# Patient Record
Sex: Female | Born: 1965 | Race: White | Hispanic: No | Marital: Married | Smoking: Former smoker
Health system: Southern US, Community
[De-identification: ages and names within clinical notes are randomized; demographics above are authoritative.]

## PROBLEM LIST (undated history)

## (undated) DIAGNOSIS — K219 Gastro-esophageal reflux disease without esophagitis: Secondary | ICD-10-CM

## (undated) DIAGNOSIS — Z9221 Personal history of antineoplastic chemotherapy: Secondary | ICD-10-CM

## (undated) DIAGNOSIS — K8020 Calculus of gallbladder without cholecystitis without obstruction: Secondary | ICD-10-CM

## (undated) DIAGNOSIS — E1165 Type 2 diabetes mellitus with hyperglycemia: Principal | ICD-10-CM

## (undated) DIAGNOSIS — K56609 Unspecified intestinal obstruction, unspecified as to partial versus complete obstruction: Secondary | ICD-10-CM

## (undated) DIAGNOSIS — R0789 Other chest pain: Secondary | ICD-10-CM

## (undated) DIAGNOSIS — I10 Essential (primary) hypertension: Secondary | ICD-10-CM

## (undated) DIAGNOSIS — M797 Fibromyalgia: Secondary | ICD-10-CM

## (undated) DIAGNOSIS — C50919 Malignant neoplasm of unspecified site of unspecified female breast: Secondary | ICD-10-CM

## (undated) DIAGNOSIS — G4733 Obstructive sleep apnea (adult) (pediatric): Secondary | ICD-10-CM

## (undated) DIAGNOSIS — G5693 Unspecified mononeuropathy of bilateral upper limbs: Secondary | ICD-10-CM

## (undated) DIAGNOSIS — G629 Polyneuropathy, unspecified: Secondary | ICD-10-CM

## (undated) DIAGNOSIS — E785 Hyperlipidemia, unspecified: Secondary | ICD-10-CM

## (undated) DIAGNOSIS — K7581 Nonalcoholic steatohepatitis (NASH): Secondary | ICD-10-CM

## (undated) DIAGNOSIS — J069 Acute upper respiratory infection, unspecified: Secondary | ICD-10-CM

## (undated) DIAGNOSIS — F909 Attention-deficit hyperactivity disorder, unspecified type: Secondary | ICD-10-CM

## (undated) DIAGNOSIS — F32A Depression, unspecified: Secondary | ICD-10-CM

## (undated) DIAGNOSIS — F419 Anxiety disorder, unspecified: Secondary | ICD-10-CM

## (undated) DIAGNOSIS — N838 Other noninflammatory disorders of ovary, fallopian tube and broad ligament: Secondary | ICD-10-CM

## (undated) DIAGNOSIS — F329 Major depressive disorder, single episode, unspecified: Secondary | ICD-10-CM

## (undated) DIAGNOSIS — J4540 Moderate persistent asthma, uncomplicated: Secondary | ICD-10-CM

## (undated) DIAGNOSIS — G2581 Restless legs syndrome: Secondary | ICD-10-CM

## (undated) DIAGNOSIS — F988 Other specified behavioral and emotional disorders with onset usually occurring in childhood and adolescence: Secondary | ICD-10-CM

## (undated) DIAGNOSIS — J4530 Mild persistent asthma, uncomplicated: Secondary | ICD-10-CM

## (undated) HISTORY — DX: Depression, unspecified: F32.A

## (undated) HISTORY — DX: Morbid (severe) obesity due to excess calories: E66.01

## (undated) HISTORY — DX: Obstructive sleep apnea (adult) (pediatric): G47.33

## (undated) HISTORY — DX: Personal history of antineoplastic chemotherapy: Z92.21

## (undated) HISTORY — DX: Hyperlipidemia, unspecified: E78.5

## (undated) HISTORY — DX: Type 2 diabetes mellitus with hyperglycemia: E11.65

## (undated) HISTORY — DX: Other noninflammatory disorders of ovary, fallopian tube and broad ligament: N83.8

## (undated) HISTORY — DX: Major depressive disorder, single episode, unspecified: F32.9

## (undated) HISTORY — DX: Restless legs syndrome: G25.81

## (undated) HISTORY — DX: Moderate persistent asthma, uncomplicated: J45.40

## (undated) HISTORY — DX: Polyneuropathy, unspecified: G62.9

## (undated) HISTORY — DX: Gastro-esophageal reflux disease without esophagitis: K21.9

## (undated) HISTORY — DX: Acute upper respiratory infection, unspecified: J06.9

## (undated) HISTORY — DX: Anxiety disorder, unspecified: F41.9

## (undated) HISTORY — DX: Unspecified mononeuropathy of bilateral upper limbs: G56.93

## (undated) HISTORY — DX: Mild persistent asthma, uncomplicated: J45.30

## (undated) HISTORY — PX: OTHER SURGICAL HISTORY: SHX169

## (undated) HISTORY — DX: Other chest pain: R07.89

## (undated) HISTORY — DX: Malignant neoplasm of unspecified site of unspecified female breast: C50.919

## (undated) HISTORY — DX: Calculus of gallbladder without cholecystitis without obstruction: K80.20

## (undated) HISTORY — DX: Nonalcoholic steatohepatitis (NASH): K75.81

## (undated) HISTORY — DX: Attention-deficit hyperactivity disorder, unspecified type: F90.9

## (undated) HISTORY — DX: Essential (primary) hypertension: I10

## (undated) HISTORY — DX: Unspecified intestinal obstruction, unspecified as to partial versus complete obstruction: K56.609

## (undated) HISTORY — DX: Other specified behavioral and emotional disorders with onset usually occurring in childhood and adolescence: F98.8

## (undated) HISTORY — DX: Fibromyalgia: M79.7

---
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
## 1998-09-28 ENCOUNTER — Emergency Department (HOSPITAL_COMMUNITY): Admission: EM | Admit: 1998-09-28 | Discharge: 1998-09-28 | Payer: Self-pay | Admitting: Emergency Medicine

## 2003-04-17 ENCOUNTER — Other Ambulatory Visit: Admission: RE | Admit: 2003-04-17 | Discharge: 2003-04-17 | Payer: Self-pay | Admitting: Family Medicine

## 2004-03-08 ENCOUNTER — Ambulatory Visit: Payer: Self-pay | Admitting: Internal Medicine

## 2004-05-02 ENCOUNTER — Ambulatory Visit: Payer: Self-pay | Admitting: Internal Medicine

## 2004-05-13 ENCOUNTER — Ambulatory Visit: Payer: Self-pay | Admitting: Family Medicine

## 2004-05-13 ENCOUNTER — Other Ambulatory Visit: Admission: RE | Admit: 2004-05-13 | Discharge: 2004-05-13 | Payer: Self-pay | Admitting: Family Medicine

## 2004-06-17 ENCOUNTER — Ambulatory Visit: Payer: Self-pay | Admitting: Family Medicine

## 2004-09-30 ENCOUNTER — Ambulatory Visit: Payer: Self-pay | Admitting: Internal Medicine

## 2005-01-18 ENCOUNTER — Encounter: Payer: Self-pay | Admitting: Pulmonary Disease

## 2005-01-18 ENCOUNTER — Ambulatory Visit (HOSPITAL_BASED_OUTPATIENT_CLINIC_OR_DEPARTMENT_OTHER): Admission: RE | Admit: 2005-01-18 | Discharge: 2005-01-18 | Payer: Self-pay | Admitting: Internal Medicine

## 2005-01-20 ENCOUNTER — Ambulatory Visit: Payer: Self-pay | Admitting: Pulmonary Disease

## 2005-03-24 ENCOUNTER — Ambulatory Visit: Payer: Self-pay | Admitting: Internal Medicine

## 2005-07-28 ENCOUNTER — Ambulatory Visit: Payer: Self-pay | Admitting: Internal Medicine

## 2006-07-24 ENCOUNTER — Encounter: Payer: Self-pay | Admitting: Internal Medicine

## 2006-07-24 ENCOUNTER — Ambulatory Visit: Payer: Self-pay | Admitting: Internal Medicine

## 2006-08-02 ENCOUNTER — Ambulatory Visit: Payer: Self-pay | Admitting: Internal Medicine

## 2006-08-02 LAB — CONVERTED CEMR LAB
Basophils Relative: 0.4 % (ref 0.0–1.0)
Bilirubin, Direct: 0.1 mg/dL (ref 0.0–0.3)
CO2: 29 meq/L (ref 19–32)
Cholesterol: 182 mg/dL (ref 0–200)
Eosinophils Relative: 3.6 % (ref 0.0–5.0)
GFR calc Af Amer: 119 mL/min
Glucose, Bld: 94 mg/dL (ref 70–99)
HCT: 41.2 % (ref 36.0–46.0)
Hemoglobin: 14.3 g/dL (ref 12.0–15.0)
Lymphocytes Relative: 32 % (ref 12.0–46.0)
Monocytes Absolute: 0.4 10*3/uL (ref 0.2–0.7)
Monocytes Relative: 6.7 % (ref 3.0–11.0)
Neutro Abs: 3.8 10*3/uL (ref 1.4–7.7)
Neutrophils Relative %: 57.3 % (ref 43.0–77.0)
Potassium: 3.8 meq/L (ref 3.5–5.1)
Sed Rate: 10 mm/hr (ref 0–25)
Sodium: 141 meq/L (ref 135–145)
Tissue Transglutaminase Ab, IgA: <3 units (ref <5)
Total Bilirubin: 0.8 mg/dL (ref 0.3–1.2)
Total Protein: 6.9 g/dL (ref 6.0–8.3)
Uric Acid, Serum: 7.3 mg/dL — ABNORMAL HIGH (ref 2.4–7.0)
VLDL: 41 mg/dL — ABNORMAL HIGH (ref 0–40)
WBC: 6.4 10*3/uL (ref 4.5–10.5)

## 2006-08-20 ENCOUNTER — Encounter: Payer: Self-pay | Admitting: Internal Medicine

## 2006-10-22 ENCOUNTER — Ambulatory Visit: Payer: Self-pay | Admitting: Family Medicine

## 2006-10-22 ENCOUNTER — Other Ambulatory Visit: Admission: RE | Admit: 2006-10-22 | Discharge: 2006-10-22 | Payer: Self-pay | Admitting: Family Medicine

## 2006-10-22 ENCOUNTER — Encounter: Payer: Self-pay | Admitting: Family Medicine

## 2006-10-24 ENCOUNTER — Encounter (INDEPENDENT_AMBULATORY_CARE_PROVIDER_SITE_OTHER): Payer: Self-pay | Admitting: *Deleted

## 2007-02-13 ENCOUNTER — Telehealth (INDEPENDENT_AMBULATORY_CARE_PROVIDER_SITE_OTHER): Payer: Self-pay | Admitting: *Deleted

## 2007-03-25 ENCOUNTER — Telehealth: Payer: Self-pay | Admitting: Internal Medicine

## 2007-04-10 ENCOUNTER — Other Ambulatory Visit: Admission: RE | Admit: 2007-04-10 | Discharge: 2007-04-10 | Payer: Self-pay | Admitting: Family Medicine

## 2007-04-10 ENCOUNTER — Ambulatory Visit: Payer: Self-pay | Admitting: Family Medicine

## 2007-04-10 ENCOUNTER — Encounter: Payer: Self-pay | Admitting: Family Medicine

## 2007-04-10 DIAGNOSIS — G2581 Restless legs syndrome: Secondary | ICD-10-CM

## 2007-04-10 DIAGNOSIS — G4733 Obstructive sleep apnea (adult) (pediatric): Secondary | ICD-10-CM

## 2007-04-10 DIAGNOSIS — R87619 Unspecified abnormal cytological findings in specimens from cervix uteri: Secondary | ICD-10-CM

## 2007-04-15 ENCOUNTER — Encounter (INDEPENDENT_AMBULATORY_CARE_PROVIDER_SITE_OTHER): Payer: Self-pay | Admitting: *Deleted

## 2007-04-18 ENCOUNTER — Ambulatory Visit: Payer: Self-pay | Admitting: Pulmonary Disease

## 2007-04-18 DIAGNOSIS — J309 Allergic rhinitis, unspecified: Secondary | ICD-10-CM

## 2007-04-18 DIAGNOSIS — J4530 Mild persistent asthma, uncomplicated: Secondary | ICD-10-CM | POA: Insufficient documentation

## 2007-04-18 DIAGNOSIS — I10 Essential (primary) hypertension: Secondary | ICD-10-CM

## 2007-04-18 DIAGNOSIS — J3089 Other allergic rhinitis: Secondary | ICD-10-CM | POA: Insufficient documentation

## 2007-04-23 ENCOUNTER — Telehealth: Payer: Self-pay | Admitting: Pulmonary Disease

## 2007-05-10 ENCOUNTER — Telehealth (INDEPENDENT_AMBULATORY_CARE_PROVIDER_SITE_OTHER): Payer: Self-pay | Admitting: *Deleted

## 2007-05-16 ENCOUNTER — Ambulatory Visit: Payer: Self-pay | Admitting: Internal Medicine

## 2007-06-22 ENCOUNTER — Emergency Department (HOSPITAL_COMMUNITY): Admission: EM | Admit: 2007-06-22 | Discharge: 2007-06-22 | Payer: Self-pay | Admitting: Emergency Medicine

## 2007-07-17 ENCOUNTER — Telehealth: Payer: Self-pay | Admitting: Pulmonary Disease

## 2007-08-21 ENCOUNTER — Ambulatory Visit: Payer: Self-pay | Admitting: Internal Medicine

## 2007-08-26 ENCOUNTER — Telehealth (INDEPENDENT_AMBULATORY_CARE_PROVIDER_SITE_OTHER): Payer: Self-pay | Admitting: *Deleted

## 2007-09-20 ENCOUNTER — Encounter: Payer: Self-pay | Admitting: Internal Medicine

## 2007-09-23 ENCOUNTER — Telehealth (INDEPENDENT_AMBULATORY_CARE_PROVIDER_SITE_OTHER): Payer: Self-pay | Admitting: *Deleted

## 2007-10-07 ENCOUNTER — Telehealth (INDEPENDENT_AMBULATORY_CARE_PROVIDER_SITE_OTHER): Payer: Self-pay | Admitting: *Deleted

## 2008-05-13 ENCOUNTER — Telehealth (INDEPENDENT_AMBULATORY_CARE_PROVIDER_SITE_OTHER): Payer: Self-pay | Admitting: *Deleted

## 2008-05-14 ENCOUNTER — Encounter: Payer: Self-pay | Admitting: Internal Medicine

## 2008-06-10 ENCOUNTER — Telehealth (INDEPENDENT_AMBULATORY_CARE_PROVIDER_SITE_OTHER): Payer: Self-pay | Admitting: *Deleted

## 2008-06-11 ENCOUNTER — Telehealth (INDEPENDENT_AMBULATORY_CARE_PROVIDER_SITE_OTHER): Payer: Self-pay | Admitting: *Deleted

## 2008-12-18 ENCOUNTER — Ambulatory Visit: Payer: Self-pay | Admitting: Family Medicine

## 2008-12-18 ENCOUNTER — Encounter: Payer: Self-pay | Admitting: Family Medicine

## 2008-12-18 ENCOUNTER — Other Ambulatory Visit: Admission: RE | Admit: 2008-12-18 | Discharge: 2008-12-18 | Payer: Self-pay | Admitting: Family Medicine

## 2008-12-18 ENCOUNTER — Encounter: Payer: Self-pay | Admitting: Internal Medicine

## 2008-12-23 ENCOUNTER — Telehealth: Payer: Self-pay | Admitting: Family Medicine

## 2008-12-29 ENCOUNTER — Ambulatory Visit: Payer: Self-pay | Admitting: Family Medicine

## 2008-12-29 ENCOUNTER — Telehealth (INDEPENDENT_AMBULATORY_CARE_PROVIDER_SITE_OTHER): Payer: Self-pay | Admitting: *Deleted

## 2008-12-29 LAB — CONVERTED CEMR LAB
Bilirubin Urine: NEGATIVE
Glucose, Urine, Semiquant: NEGATIVE
Ketones, urine, test strip: NEGATIVE
Protein, U semiquant: NEGATIVE
Urobilinogen, UA: 0.2
pH: 5

## 2008-12-30 ENCOUNTER — Telehealth (INDEPENDENT_AMBULATORY_CARE_PROVIDER_SITE_OTHER): Payer: Self-pay | Admitting: *Deleted

## 2008-12-30 ENCOUNTER — Encounter: Payer: Self-pay | Admitting: Family Medicine

## 2008-12-30 LAB — CONVERTED CEMR LAB
ALT: 73 units/L — ABNORMAL HIGH (ref 0–35)
AST: 41 units/L — ABNORMAL HIGH (ref 0–37)
Albumin: 3.9 g/dL (ref 3.5–5.2)
Alkaline Phosphatase: 30 units/L — ABNORMAL LOW (ref 39–117)
BUN: 15 mg/dL (ref 6–23)
Basophils Relative: 0.9 % (ref 0.0–3.0)
CO2: 25 meq/L (ref 19–32)
Chloride: 103 meq/L (ref 96–112)
Eosinophils Relative: 4.1 % (ref 0.0–5.0)
GFR calc non Af Amer: 97.34 mL/min (ref >60)
Glucose, Bld: 98 mg/dL (ref 70–99)
MCV: 91.5 fL (ref 78.0–100.0)
Monocytes Absolute: 0.6 10*3/uL (ref 0.1–1.0)
Monocytes Relative: 6.8 % (ref 3.0–12.0)
Neutrophils Relative %: 62.4 % (ref 43.0–77.0)
Potassium: 3.9 meq/L (ref 3.5–5.1)
RBC: 4.45 M/uL (ref 3.87–5.11)
Sodium: 138 meq/L (ref 135–145)
TSH: 1.41 microintl units/mL (ref 0.35–5.50)
Total CHOL/HDL Ratio: 6
Total Protein: 7.4 g/dL (ref 6.0–8.3)
WBC: 9.3 10*3/uL (ref 4.5–10.5)

## 2009-01-01 ENCOUNTER — Ambulatory Visit: Payer: Self-pay | Admitting: Family Medicine

## 2009-01-04 ENCOUNTER — Encounter: Payer: Self-pay | Admitting: Family Medicine

## 2009-05-20 ENCOUNTER — Telehealth (INDEPENDENT_AMBULATORY_CARE_PROVIDER_SITE_OTHER): Payer: Self-pay | Admitting: *Deleted

## 2009-07-12 ENCOUNTER — Ambulatory Visit: Payer: Self-pay | Admitting: Internal Medicine

## 2009-08-06 ENCOUNTER — Telehealth: Payer: Self-pay | Admitting: Family Medicine

## 2009-08-12 ENCOUNTER — Telehealth (INDEPENDENT_AMBULATORY_CARE_PROVIDER_SITE_OTHER): Payer: Self-pay | Admitting: *Deleted

## 2009-11-12 ENCOUNTER — Telehealth: Payer: Self-pay | Admitting: Family Medicine

## 2010-02-10 DIAGNOSIS — K7581 Nonalcoholic steatohepatitis (NASH): Secondary | ICD-10-CM

## 2010-02-10 DIAGNOSIS — K760 Fatty (change of) liver, not elsewhere classified: Secondary | ICD-10-CM | POA: Insufficient documentation

## 2010-02-10 HISTORY — DX: Nonalcoholic steatohepatitis (NASH): K75.81

## 2010-02-20 ENCOUNTER — Encounter: Payer: Self-pay | Admitting: Internal Medicine

## 2010-02-20 ENCOUNTER — Emergency Department (HOSPITAL_COMMUNITY)
Admission: EM | Admit: 2010-02-20 | Discharge: 2010-02-20 | Payer: Self-pay | Source: Home / Self Care | Admitting: Emergency Medicine

## 2010-03-13 DIAGNOSIS — Z853 Personal history of malignant neoplasm of breast: Secondary | ICD-10-CM

## 2010-03-13 HISTORY — DX: Personal history of malignant neoplasm of breast: Z85.3

## 2010-03-24 ENCOUNTER — Encounter: Payer: Self-pay | Admitting: Family Medicine

## 2010-03-28 ENCOUNTER — Encounter: Payer: Self-pay | Admitting: Family Medicine

## 2010-04-12 NOTE — Progress Notes (Signed)
Summary: refill  Phone Note Refill Request Call back at Home Phone [phone] Message from:  Patient  Refills Requested: Medication #1:  ADVAIR DISKUS 250-50 MCG/DOSE  MISC 1 inhalation every 12 hrs ; gargle after use patient  not longer has mail order - she [facility] [place] [facility] [phone]   Method Requested: Fax to [place] Initial call taken by: Arbie Cookey [place],  August 12, 2009 8:41 AM    Prescriptions: ADVAIR DISKUS 250-50 MCG/DOSE  MISC (FLUTICASONE-SALMETEROL) 1 inhalation every 12 hrs ; gargle after use  #1 x 3   Entered by:   Allyn Kenner CMA   Authorized by:   Garnet Koyanagi DO   Signed by:   Allyn Kenner CMA on 08/12/2009   Method used:   [facility]ronically to        [facility]       [address]       [place], [place]  [address]       Ph: [phone] or [phone]       Fax: [phone]   RxID:   [id number]

## 2010-04-12 NOTE — Progress Notes (Signed)
Summary: Refill Request  Phone Note Refill Request Message from:  [facility] [address] Fax #: [phone]  Refills Requested: Medication #1:  REQUIP 0.5 MG  TABS 1 by mouth after dinner   Dosage confirmed as above?Dosage Confirmed   Brand Name Necessary? No   Supply Requested: 1 month   Last Refilled: 04/14/2009 Next Appointment Scheduled: none Initial call taken by: Elna Breslow,  May 20, 2009 8:08 AM    Prescriptions: REQUIP 0.5 MG  TABS (ROPINIROLE HCL) 1 by mouth after dinner  #30 x 0   Entered by:   Allyn Kenner CMA   Authorized by:   Garnet Koyanagi DO   Signed by:   Allyn Kenner CMA on 05/20/2009   Method used:   [facility]        [id number]       [address]       [place], [place]  [address]       Ph: [phone] or [phone]       Fax: [phone]   RxID:   [id number]

## 2010-04-12 NOTE — Assessment & Plan Note (Signed)
Summary: arm pain.cbs   Vital Signs:  Patient profile:   45 year old female Weight:      270.2 pounds Temp:     98.4 degrees F oral Pulse rate:   84 / minute Resp:     18 per minute BP sitting:   134 / 86  (left arm) Cuff size:   large  Vitals Entered By: Georgette Dover (Jul 12, 2009 4:30 PM) CC: Left arm pain x 1 month or longer (injured x 2, once on the mirror of the car, then on the door) Comments REVIEWED MED LIST, PATIENT AGREED DOSE AND INSTRUCTION CORRECT    Primary Care Provider:  Etter Sjogren  CC:  Left arm pain x 1 month or longer (injured x 2, once on the mirror of the car, and then on the door).  History of Present Illness: She hit L elbow area on  mirror  & same week against door jamb 1 month ago. 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  Allergies: 1)  ! Sulfa 2)  ! * Metoprolol 3)  ! Skipper Cliche  Review of Systems MS:  Complains of joint pain and loss of strength; denies joint redness and joint swelling; Decreased strength entire LUE. Derm:  Denies changes in color of skin, lesion(s), and rash. Neuro:  Denies brief paralysis and tingling; Minor numbness of R hand.  Physical Exam  General:  in no acute distress; alert,appropriate and cooperative throughout examination Extremities:  No clubbing, cyanosis, edema, or deformity noted with normal full range of motion of all joints.  Classic L elbow tendonitis   Neurologic:  strength normal in all extremities and DTRs symmetrical and normal.   Skin:  Intact without suspicious lesions or rashes   Impression & Recommendations:  Problem # 1:  LATERAL EPICONDYLITIS, LEFT (ICD-726.32)  Complete Medication List: 1)  Verapamil Hcl 120 Mg Tabs (Verapamil hcl) .... 1/2 tablet bid 2)  Triamterene-hctz 37.5-25 Mg Tabs (Triamterene-hctz) .... Take 1/2 tablet everyday 3)  Loratadine Allergy Relief 10 Mg Tbdp  (Loratadine) .... Daily prn 4)  Albuterol 90 Mcg/act Aers (Albuterol) .... As needed 5)  Multivitamins Tabs (Multiple vitamin) .... Take 1 tablet daily by mouth 6)  Calcium  .... Take as directed 7)  Requip 0.5 Mg Tabs (Ropinirole hcl) .Marland Kitchen.. 1 by mouth after dinner 8)  Advair Diskus 250-50 Mcg/dose Misc (Fluticasone-salmeterol) .Marland Kitchen.. 1 inhalation every 12 hrs ; gargle after use 9)  Citalopram Hydrobromide 20 Mg Tabs (Citalopram hydrobromide) .Marland Kitchen.. 1 by mouth once daily 10)  Fish Oil 1000 Mg Caps (Omega-3 fatty acids) 11)  Linseed Oil Oil (Flaxseed oil)  Patient Instructions: 1)  Tendon stretching exercises two times a day followed by Zostrix Cream OR moist heat.Glucosamine sulfate 1500 mg once daily X 6- 8 weeks. 2)  Take 400-624m of Ibuprofen (Advil, Motrin) with food every 4-6 hours as needed for relief of pain  OR Aleve 1-2 every 8-12 hrs as needed

## 2010-04-12 NOTE — Progress Notes (Signed)
Summary: REFILL  Phone Note Refill Request Message from:  Fax from Pharmacy on November 12, 2009 11:58 AM  Refills Requested: Medication #1:  PROAIR HFA 90 MCG INHALER 1TO 2 PUFFS EVERY 4 HOURS AS NEEDED [facility] [address] [facility] [phone]  Initial call taken by: Arbie Cookey [place],  November 12, 2009 12:00 PM    Prescriptions: ALBUTEROL 90 MCG/ACT  AERS (ALBUTEROL) as needed  #3 x 1   Entered by:   Georgette Dover CMA   Authorized by:   Garnet Koyanagi DO   Signed by:   Georgette Dover CMA on 11/12/2009   Method used:   [facility]        [id number]       [address]       [place], [place]  [address]       Ph: [phone] or [phone]       Fax: [phone]   RxID:   [id number]

## 2010-04-12 NOTE — Progress Notes (Signed)
Summary: Refill Request  Phone Note Refill Request Call back at [phone] Message from:  Pharmacy on Aug 06, 2009 8:05 AM  Refills Requested: Medication #1:  CITALOPRAM HYDROBROMIDE 20 MG  TABS 1 by mouth once daily   Dosage confirmed as above?Dosage Confirmed   Supply Requested: 1 month   [facility] [address]  Next Appointment Scheduled: none Initial call taken by: Elna Breslow,  Aug 06, 2009 8:06 AM  Follow-up for Phone Call        last ov- 07/12/09. [place]  Aug 06, 2009 8:47 AM   Additional Follow-up for Phone Call Additional follow up Details #1::        ok to refill x1--5 refills Additional Follow-up by: Garnet Koyanagi DO,  Aug 06, 2009 10:33 AM    Prescriptions: CITALOPRAM HYDROBROMIDE 20 MG  TABS (CITALOPRAM HYDROBROMIDE) 1 by mouth once daily  #30 Tablet x 5   Entered by:   Allyn Kenner CMA   Authorized by:   Garnet Koyanagi DO   Signed by:   Allyn Kenner CMA on 08/06/2009   Method used:   [facility]ronically to        [facility]       [address]       [place], [place]  [address]       Ph: [phone] or [phone]       Fax: [phone]   RxID:   [id number]

## 2010-04-14 NOTE — Letter (Signed)
Summary: Primary Care Appointment Letter  [facility]   [place], [place] [address]   Phone: [phone]  Fax: [phone]    03/24/2010 MRN: [id number]    GREY SCHLAUCH [address] [place], [place]  [address]    Dear Ms. Jearld Fenton,   Your Primary Care Physician Garnet Koyanagi DO has indicated that:    ___x____it is time to schedule an appointment for your yearly physical and fasting labs.    _______you missed your appointment on______ and need to call and          reschedule.    _______you need to have lab work done.    _______you need to schedule an appointment discuss lab or test results.    _______you need to call to reschedule your appointment that is                       scheduled on _________.     Please call our office as soon as possible. Our phone number is [phone]. Please press option 1. Our office is open 8a-5p, Monday through Friday.     Thank you,    [place]

## 2010-04-20 NOTE — Medication Information (Signed)
Summary: Nonadherence with Meds/[employer]  Nonadherence with Meds/[employer]   Imported By: Edmonia James 04/11/2010 11:22:12  _____________________________________________________________________  External Attachment:    Type:   Image     Comment:   External Document

## 2010-04-25 ENCOUNTER — Telehealth: Payer: Self-pay | Admitting: Family Medicine

## 2010-04-26 ENCOUNTER — Ambulatory Visit: Payer: Self-pay | Admitting: Family Medicine

## 2010-04-26 ENCOUNTER — Encounter: Payer: Self-pay | Admitting: Internal Medicine

## 2010-04-26 ENCOUNTER — Encounter (INDEPENDENT_AMBULATORY_CARE_PROVIDER_SITE_OTHER): Payer: Managed Care, Other (non HMO) | Admitting: Internal Medicine

## 2010-04-26 DIAGNOSIS — E782 Mixed hyperlipidemia: Secondary | ICD-10-CM | POA: Insufficient documentation

## 2010-04-26 DIAGNOSIS — F17200 Nicotine dependence, unspecified, uncomplicated: Secondary | ICD-10-CM | POA: Insufficient documentation

## 2010-04-26 DIAGNOSIS — J45909 Unspecified asthma, uncomplicated: Secondary | ICD-10-CM

## 2010-04-26 DIAGNOSIS — F988 Other specified behavioral and emotional disorders with onset usually occurring in childhood and adolescence: Secondary | ICD-10-CM

## 2010-04-26 DIAGNOSIS — Z0000 Encounter for general adult medical examination without abnormal findings: Secondary | ICD-10-CM

## 2010-04-26 DIAGNOSIS — I10 Essential (primary) hypertension: Secondary | ICD-10-CM

## 2010-04-26 DIAGNOSIS — E785 Hyperlipidemia, unspecified: Secondary | ICD-10-CM

## 2010-04-26 DIAGNOSIS — G2581 Restless legs syndrome: Secondary | ICD-10-CM

## 2010-04-26 LAB — CONVERTED CEMR LAB
Bilirubin Urine: NEGATIVE
Blood in Urine, dipstick: NEGATIVE
Glucose, Urine, Semiquant: NEGATIVE
Ketones, urine, test strip: NEGATIVE
Protein, U semiquant: NEGATIVE
Specific Gravity, Urine: 1.01
WBC Urine, dipstick: NEGATIVE
pH: 7

## 2010-05-03 ENCOUNTER — Other Ambulatory Visit: Payer: Self-pay | Admitting: Family Medicine

## 2010-05-03 ENCOUNTER — Other Ambulatory Visit (HOSPITAL_COMMUNITY)
Admission: RE | Admit: 2010-05-03 | Discharge: 2010-05-03 | Disposition: A | Discharge status: Home / Self Care | Payer: Managed Care, Other (non HMO) | Source: Ambulatory Visit | Attending: Family Medicine | Admitting: Family Medicine

## 2010-05-03 ENCOUNTER — Ambulatory Visit (INDEPENDENT_AMBULATORY_CARE_PROVIDER_SITE_OTHER): Payer: Managed Care, Other (non HMO) | Admitting: Family Medicine

## 2010-05-03 ENCOUNTER — Encounter: Payer: Self-pay | Admitting: Family Medicine

## 2010-05-03 DIAGNOSIS — E785 Hyperlipidemia, unspecified: Secondary | ICD-10-CM

## 2010-05-03 DIAGNOSIS — I10 Essential (primary) hypertension: Secondary | ICD-10-CM

## 2010-05-03 DIAGNOSIS — Z0000 Encounter for general adult medical examination without abnormal findings: Secondary | ICD-10-CM

## 2010-05-03 DIAGNOSIS — Z01419 Encounter for gynecological examination (general) (routine) without abnormal findings: Secondary | ICD-10-CM | POA: Insufficient documentation

## 2010-05-04 ENCOUNTER — Other Ambulatory Visit: Payer: Self-pay | Admitting: Family Medicine

## 2010-05-04 DIAGNOSIS — Z1231 Encounter for screening mammogram for malignant neoplasm of breast: Secondary | ICD-10-CM

## 2010-05-04 LAB — CBC WITH DIFFERENTIAL/PLATELET
Basophils Absolute: 0 10*3/uL (ref 0.0–0.1)
Eosinophils Absolute: 0.2 10*3/uL (ref 0.0–0.7)
HCT: 41.7 % (ref 36.0–46.0)
Hemoglobin: 14.5 g/dL (ref 12.0–15.0)
Lymphs Abs: 3.8 10*3/uL (ref 0.7–4.0)
MCHC: 34.8 g/dL (ref 30.0–36.0)
Neutro Abs: 1.9 10*3/uL (ref 1.4–7.7)
Platelets: 324 10*3/uL (ref 150.0–400.0)
RDW: 13.4 % (ref 11.5–14.6)

## 2010-05-04 LAB — BASIC METABOLIC PANEL
BUN: 10 mg/dL (ref 6–23)
CO2: 26 mEq/L (ref 19–32)
Calcium: 9.7 mg/dL (ref 8.4–10.5)
GFR: 103.52 mL/min (ref >60.00)
Glucose, Bld: 66 mg/dL — ABNORMAL LOW (ref 70–99)
Sodium: 139 mEq/L (ref 135–145)

## 2010-05-04 LAB — HEPATIC FUNCTION PANEL
Albumin: 4.3 g/dL (ref 3.5–5.2)
Total Bilirubin: 0.5 mg/dL (ref 0.3–1.2)

## 2010-05-04 LAB — LIPID PANEL
Cholesterol: 166 mg/dL (ref 0–200)
HDL: 33.8 mg/dL — ABNORMAL LOW (ref >39.00)
VLDL: 30.6 mg/dL (ref 0.0–40.0)

## 2010-05-04 LAB — TSH: TSH: 1.45 u[IU]/mL (ref 0.35–5.50)

## 2010-05-04 NOTE — Progress Notes (Signed)
Summary: refill  Phone Note Refill Request Message from:  Fax from Pharmacy on April 25, 2010 10:54 AM  Refills Requested: Medication #1:  REQUIP 0.5 MG  TABS 1 by mouth after dinner   [facility] [address] [facility] [place] [facility] fax [phone]  Initial call taken by: Arbie Cookey [place],  April 25, 2010 10:55 AM  Follow-up for Phone Call        last seen 07/12/09 and filled 02/01/10.Marland KitchenMarland Kitchenplease advise Follow-up by: Aron Baba CMA Deborra Medina),  April 25, 2010 4:06 PM  Additional Follow-up for Phone Call Additional follow up Details #1::        refill until May Additional Follow-up by: Garnet Koyanagi DO,  April 25, 2010 4:23 PM    Prescriptions: REQUIP 0.5 MG  TABS (ROPINIROLE HCL) 1 by mouth after dinner  #30 Tablet x 3   Entered by:   Aron Baba CMA ([place])   Authorized by:   Garnet Koyanagi DO   Signed by:   Aron Baba CMA ([place]) on 04/25/2010   Method used:   [facility] ...       [facility]       [address]       [place], [place]  [address]       Ph: [phone] or [phone]       Fax: [phone]   RxID:   [id number]

## 2010-05-04 NOTE — Assessment & Plan Note (Signed)
Summary: cpx/cbs   Vital Signs:  Patient profile:   45 year old female Height:      64.5 inches Weight:      267.4 pounds BMI:     45.35 Temp:     98.3 degrees F oral Pulse rate:   84 / minute Resp:     16 per minute BP sitting:   126 / 78  (left arm) Cuff size:   large  Vitals Entered By: Georgette Dover CMA (April 26, 2010 1:13 PM) CC: CPX, discuss changing Advair and refill meds , COPD follow-up   Primary Care Provider:  Etter Sjogren  CC:  CPX, discuss changing Advair and refill meds , and COPD follow-up.  History of Present Illness:    [name] is here for a physical; she is asymptomatic except for recent RTI related issues.    RAD  Follow-Up: She  reports cough  she relates to smoking 1/3-1/2 ppd and  some exercise induced symptoms. She  denies shortness of breath, chest tightness, wheezing, increased sputum, heat intolerance, and nocturnal awakening.  The patient reports no limitation in activities.  Medication use includes quick relief med daily during the RTI  and controller med intermittently(Advair 2-3X/week).     Hypertension Follow-Up: She  denies lightheadedness, urinary frequency, headaches, edema, and fatigue.  Associated symptoms include palpitations.  The patient denies the following associated symptoms: chest pain, chest pressure, and syncope.  Compliance with medications (by patient report) has been near 100%.  The patient reports that dietary compliance has been fair.  The patient reports no exercise.  Adjunctive measures currently used by the patient include salt restriction.  BP not monitored  Allergies: 1)  ! Sulfa 2)  ! * Metoprolol 3)  ! Skipper Cliche  Past History:  Past Medical History: ADD Allergic Rhinitis Asthma Hypertension Hyperlipidemia, PMH of  Past Surgical History: G 2 P 2   Family History: adopted (HTN in [place] )  Social History: Occupation:  Pharmacist, community  Married Alcohol use- RARE  Review of Systems  The patient denies anorexia,  fever, vision loss, decreased hearing, abdominal pain, melena, hematochezia, severe indigestion/heartburn, hematuria, suspicious skin lesions, depression, unusual weight change, abnormal bleeding, enlarged lymph nodes, and angioedema.         Weight gradually progressive.  Physical Exam  General:  alert,appropriate and cooperative throughout examination Head:  Normocephalic and atraumatic without obvious abnormalities. . Eyes:  No corneal or conjunctival inflammation noted.Perrla. Funduscopic exam benign, without hemorrhages, exudates or papilledema.  Ears:  External ear exam shows no significant lesions or deformities.  Otoscopic examination reveals clear canals, tympanic membranes are intact bilaterally without bulging, retraction, inflammation or discharge. Hearing is grossly normal bilaterally. Nose:  External nasal examination shows no deformity or inflammation. Nasal mucosa are pink and moist without lesions or exudates. Mouth:  Oral mucosa and oropharynx without lesions or exudates.  Teeth in good repair. Neck:  No deformities, masses, or tenderness noted. Lungs:  Normal respiratory effort, chest expands symmetrically. Lungs are clear to auscultation, no crackles or wheezes. Heart:  Normal rate and regular rhythm. S1 and S2 normal without gallop, murmur, click, rub or other extra sounds. Abdomen:  Bowel sounds positive,abdomen soft and non-tender without masses, organomegaly or hernias noted. Striae Msk:  No deformity or scoliosis noted of thoracic or lumbar spine.   Pulses:  R and L carotid,radial,dorsalis pedis and posterior tibial pulses are full and equal bilaterally Extremities:  No clubbing, cyanosis, edema, or deformity noted with normal full range  of motion of all joints.   Neurologic:  alert & oriented X3 and DTRs symmetrical and normal.   Skin:  Intact without suspicious lesions or rashes Cervical Nodes:  No lymphadenopathy noted Axillary Nodes:  No palpable  lymphadenopathy Psych:  memory intact for recent and remote, normally interactive, and good eye contact.     Impression & Recommendations:  Problem # 1:  ROUTINE GENERAL MEDICAL EXAM@HEALTH  CARE FACL (ICD-V70.0)  Orders: UA Dipstick w/o Micro (manual) (81002) EKG w/ Interpretation (93000)  Problem # 2:  HYPERLIPIDEMIA (ICD-272.4)  Problem # 3:  HYPERTENSION (ICD-401.9) controlled Her updated medication list for this problem includes:    Verapamil Hcl 120 Mg Tabs (Verapamil hcl) .Marland Kitchen... 1/2 tablet bid    Triamterene-hctz 37.5-25 Mg Tabs (Triamterene-hctz) .Marland Kitchen... Take 1/2 tablet everyday  Problem # 4:  ASTHMA (ICD-493.90)  Her updated medication list for this problem includes:    Albuterol 90 Mcg/act Aers (Albuterol) .Marland Kitchen... As needed    Advair Diskus 250-50 Mcg/dose Misc (Fluticasone-salmeterol) .Marland Kitchen... 1 inhalation every 12 hrs ; gargle after use  Problem # 5:  ADD (ICD-314.00)  Problem # 6:  SMOKER (ICD-305.1) risks discussed  Problem # 7:  RESTLESS LEGS SYNDROME (ICD-333.94)  Complete Medication List: 1)  Verapamil Hcl 120 Mg Tabs (Verapamil hcl) .... 1/2 tablet bid 2)  Triamterene-hctz 37.5-25 Mg Tabs (Triamterene-hctz) .... Take 1/2 tablet everyday 3)  Loratadine Allergy Relief 10 Mg Tbdp (Loratadine) .... Daily prn 4)  Albuterol 90 Mcg/act Aers (Albuterol) .... As needed 5)  Multivitamins Tabs (Multiple vitamin) .... Take 1 tablet daily by mouth 6)  Calcium  .... Take as directed 7)  Requip 0.5 Mg Tabs (Ropinirole hcl) .Marland Kitchen.. 1 by mouth after dinner 8)  Advair Diskus 250-50 Mcg/dose Misc (Fluticasone-salmeterol) .Marland Kitchen.. 1 inhalation every 12 hrs ; gargle after use 9)  Citalopram Hydrobromide 20 Mg Tabs (Citalopram hydrobromide) .Marland Kitchen.. 1 by mouth once daily 10)  Fish Oil 1000 Mg Caps (Omega-3 fatty acids) 11)  Flax Seed Oil 1000 Mg Caps (Flaxseed (linseed)) .Marland Kitchen.. 1 by mouth once daily 12)  Aleve 220 Mg Tabs (Naproxen sodium) .... As needed 13)  Ibuprofen 400 Mg Tabs (Ibuprofen)  .... As needed only  Patient Instructions: 1)  Avoid HFCS sugar as discussed.Recommended fasting labs: 2)  BMP ; 3)  Hepatic Panel; 4)  Lipid Panel ; 5)  TSH ; 6)  CBC w/ Diff. 7)  Stop Smoking Tips: Choose a Quit date. Cut down before the Quit date. decide what you will do as a substitute when you feel the urge to smoke(gum,toothpick,exercise). Prescriptions: CITALOPRAM HYDROBROMIDE 20 MG  TABS (CITALOPRAM HYDROBROMIDE) 1 by mouth once daily  #90 x 3   Entered and Authorized by:   Unice Cobble MD   Signed by:   Unice Cobble MD on 04/26/2010   Method used:   Print then Give to Patient   RxID:   [id number] ADVAIR DISKUS 250-50 MCG/DOSE  MISC (FLUTICASONE-SALMETEROL) 1 inhalation every 12 hrs ; gargle after use  #1 x 11   Entered and Authorized by:   Unice Cobble MD   Signed by:   Unice Cobble MD on 04/26/2010   Method used:   Print then Give to Patient   RxID:   [id number] REQUIP 0.5 MG  TABS (ROPINIROLE HCL) 1 by mouth after dinner  #30 Tablet x 11   Entered and Authorized by:   Unice Cobble MD   Signed by:   Unice Cobble MD on 04/26/2010   Method used:  Print then Give to Patient   RxID:   [id number] LORATADINE ALLERGY RELIEF 10 MG  TBDP (LORATADINE) daily prn  #90 x 3   Entered and Authorized by:   Unice Cobble MD   Signed by:   Unice Cobble MD on 04/26/2010   Method used:   Print then Give to Patient   RxID:   [id number] TRIAMTERENE-HCTZ 37.5-25 MG  TABS (TRIAMTERENE-HCTZ) take 1/2 tablet everyday  #90 x 1   Entered and Authorized by:   Unice Cobble MD   Signed by:   Unice Cobble MD on 04/26/2010   Method used:   Print then Give to Patient   RxID:   [id number] VERAPAMIL HCL 120 MG TABS (VERAPAMIL HCL) 1/2 tablet bid  #90 x 3   Entered and Authorized by:   Unice Cobble MD   Signed by:   Unice Cobble MD on 04/26/2010   Method used:   Print then Give to Patient   RxID:   [id number]    Orders Added: 1)  UA  Dipstick w/o Micro (manual) [81002] 2)  Est. Patient 40-64 years [99396] 3)  EKG w/ Interpretation [93000]    Laboratory Results   Urine Tests    Routine Urinalysis   Color: yellow Appearance: Clear Glucose: negative   (Normal Range: Negative) Bilirubin: negative   (Normal Range: Negative) Ketone: negative   (Normal Range: Negative) Spec. Gravity: 1.010   (Normal Range: 1.003-1.035) Blood: negative   (Normal Range: Negative) pH: 7.0   (Normal Range: 5.0-8.0) Protein: negative   (Normal Range: Negative) Urobilinogen: 0.2   (Normal Range: 0-1) Nitrite: negative   (Normal Range: Negative) Leukocyte Esterace: negative   (Normal Range: Negative)

## 2010-05-09 DIAGNOSIS — R740 Nonspecific elevation of levels of transaminase and lactic acid dehydrogenase [LDH]: Secondary | ICD-10-CM

## 2010-05-10 ENCOUNTER — Other Ambulatory Visit: Payer: Self-pay | Admitting: Family Medicine

## 2010-05-10 NOTE — Assessment & Plan Note (Signed)
Summary: pap only/cbs   Vital Signs:  Patient profile:   45 year old female Menstrual status:  regular LMP:     04/13/2010 Weight:      267.4 pounds Pulse rate:   84 / minute Pulse rhythm:   regular BP sitting:   122 / 74  (left arm) Cuff size:   large  Vitals Entered By: Aron Baba CMA Deborra Medina) (May 03, 2010 2:15 PM) CC: Pap Only LMP (date): 04/13/2010     Menstrual Status regular Enter LMP: 04/13/2010 Last PAP Result NEGATIVE FOR INTRAEPITHELIAL LESIONS OR MALIGNANCY.   History of Present Illness: Pt here for gyn exam only.  no complaints.    Preventive Screening-Counseling & Management  Alcohol-Tobacco     Alcohol drinks/day: <1     Smoking Status: quit     Smoking Cessation Counseling: yes     Packs/Day: 3cig/d     Year Started: 1992     Year Quit: 2009     Pack years: less than 1 ppd     Passive Smoke Exposure: yes  Caffeine-Diet-Exercise     Caffeine use/day: 2     Does Patient Exercise: yes     Type of exercise: walking, weights     Times/week: 2  Problems Prior to Update: 1)  Smoker  (ICD-305.1) 2)  Routine General Medical Exam@[facility]  (ICD-V70.0) 3)  Hyperlipidemia  (ICD-272.4) 4)  Hypertension  (ICD-401.9) 5)  Asthma  (ICD-493.90) 6)  Allergic Rhinitis  (ICD-477.9) 7)  Restless Legs Syndrome  (ICD-333.94) 8)  Obstructive Sleep Apnea  (ICD-327.23) 9)  Pap Smear, Abnormal  (ICD-795.00) 10)  Add  (ICD-314.00)  Medications Prior to Update: 1)  Verapamil Hcl 120 Mg Tabs (Verapamil Hcl) .... 1/2 Tablet Bid 2)  Triamterene-Hctz 37.5-25 Mg  Tabs (Triamterene-Hctz) .... Take 1/2 Tablet Everyday 3)  Loratadine Allergy Relief 10 Mg  Tbdp (Loratadine) .... Daily Prn 4)  Albuterol 90 Mcg/act  Aers (Albuterol) .... As Needed 5)  Multivitamins   Tabs (Multiple Vitamin) .... Take 1 Tablet Daily By Mouth 6)  Calcium .... Take As Directed 7)  Requip 0.5 Mg  Tabs (Ropinirole Hcl) .Marland Kitchen.. 1 By Mouth After Dinner 8)  Advair Diskus 250-50 Mcg/dose   Misc (Fluticasone-Salmeterol) .Marland Kitchen.. 1 Inhalation Every 12 Hrs ; Gargle After Use 9)  Citalopram Hydrobromide 20 Mg  Tabs (Citalopram Hydrobromide) .Marland Kitchen.. 1 By Mouth Once Daily 10)  Fish Oil 1000 Mg Caps (Omega-3 Fatty Acids) 11)  Flax Seed Oil 1000 Mg Caps (Flaxseed (Linseed)) .Marland Kitchen.. 1 By Mouth Once Daily 12)  Aleve 220 Mg Tabs (Naproxen Sodium) .... As Needed 13)  Ibuprofen 400 Mg Tabs (Ibuprofen) .... As Needed Only  Current Medications (verified): 1)  Verapamil Hcl 120 Mg Tabs (Verapamil Hcl) .... 1/2 Tablet Bid 2)  Triamterene-Hctz 37.5-25 Mg  Tabs (Triamterene-Hctz) .... Take 1/2 Tablet Everyday 3)  Loratadine Allergy Relief 10 Mg  Tbdp (Loratadine) .... Daily Prn 4)  Albuterol 90 Mcg/act  Aers (Albuterol) .... As Needed 5)  Multivitamins   Tabs (Multiple Vitamin) .... Take 1 Tablet Daily By Mouth 6)  Calcium .... Take As Directed 7)  Requip 0.5 Mg  Tabs (Ropinirole Hcl) .Marland Kitchen.. 1 By Mouth After Dinner 8)  Advair Diskus 250-50 Mcg/dose  Misc (Fluticasone-Salmeterol) .Marland Kitchen.. 1 Inhalation Every 12 Hrs ; Gargle After Use 9)  Citalopram Hydrobromide 20 Mg  Tabs (Citalopram Hydrobromide) .Marland Kitchen.. 1 By Mouth Once Daily 10)  Fish Oil 1000 Mg Caps (Omega-3 Fatty Acids) 11)  Flax Seed Oil 1000  Mg Caps (Flaxseed (Linseed)) .Marland Kitchen.. 1 By Mouth Once Daily 12)  Aleve 220 Mg Tabs (Naproxen Sodium) .... As Needed 13)  Ibuprofen 400 Mg Tabs (Ibuprofen) .... As Needed Only  Allergies (verified): 1)  ! Sulfa 2)  ! * Metoprolol 3)  ! Skipper Cliche  Past History:  Past Medical History: Last updated: 04/26/2010 ADD Allergic Rhinitis Asthma Hypertension Hyperlipidemia, PMH of  Past Surgical History: Last updated: 04/26/2010 G 2 P 2   Family History: Last updated: 04/26/2010 adopted (HTN in [place] )  Social History: Last updated: 04/26/2010 Occupation:  Jewelry Maker  Married Alcohol use- RARE  Risk Factors: Alcohol Use: <1 (05/03/2010) Caffeine Use: 2 (05/03/2010) Exercise: yes (05/03/2010)  Risk  Factors: Smoking Status: quit (05/03/2010) Packs/Day: 3cig/d (05/03/2010) Passive Smoke Exposure: yes (05/03/2010)  Family History: Reviewed history from 04/26/2010 and no changes required. adopted ([employer] in [place] )  Social History: Reviewed history from 04/26/2010 and no changes required. Occupation:  Pharmacist, community  Married Alcohol use- RARE  Review of Systems      See HPI  Physical Exam  General:  Well-developed,well-nourished,in no acute distress; alert,appropriate and cooperative throughout examination, overweight-appearing.   Breasts:  No mass, nodules, thickening, tenderness, bulging, retraction, inflamation, nipple discharge or skin changes noted.   Abdomen:  Bowel sounds positive,abdomen soft and non-tender without masses, organomegaly or hernias noted. Rectal:  No external abnormalities noted. Normal sphincter tone. No rectal masses or tenderness.  heme neg brown stool Genitalia:  Pelvic Exam:        External: normal female genitalia without lesions or masses        Vagina: normal without lesions or masses        Cervix: normal without lesions or masses        Adnexa: normal bimanual exam without masses or fullness        Uterus: normal by palpation        Pap smear: performed Extremities:  No clubbing, cyanosis, edema, or deformity noted with normal full range of motion of all joints.   Psych:  Oriented X3 and normally interactive.     Impression & Recommendations:  Problem # 1:  ROUTINE GYNECOLOGICAL EXAMINATION (ICD-V72.31) pap done  labs to be drawn per hopp Orders: Radiology Referral (Radiology)  Complete Medication List: 1)  Verapamil Hcl 120 Mg Tabs (Verapamil hcl) .... 1/2 tablet bid 2)  Triamterene-hctz 37.5-25 Mg Tabs (Triamterene-hctz) .... Take 1/2 tablet everyday 3)  Loratadine Allergy Relief 10 Mg Tbdp (Loratadine) .... Daily prn 4)  Albuterol 90 Mcg/act Aers (Albuterol) .... As needed 5)  Multivitamins Tabs (Multiple vitamin) .... Take 1 tablet daily  by mouth 6)  Calcium  .... Take as directed 7)  Requip 0.5 Mg Tabs (Ropinirole hcl) .Marland Kitchen.. 1 by mouth after dinner 8)  Advair Diskus 250-50 Mcg/dose Misc (Fluticasone-salmeterol) .Marland Kitchen.. 1 inhalation every 12 hrs ; gargle after use 9)  Citalopram Hydrobromide 20 Mg Tabs (Citalopram hydrobromide) .Marland Kitchen.. 1 by mouth once daily 10)  Fish Oil 1000 Mg Caps (Omega-3 fatty acids) 11)  Flax Seed Oil 1000 Mg Caps (Flaxseed (linseed)) .Marland Kitchen.. 1 by mouth once daily 12)  Aleve 220 Mg Tabs (Naproxen sodium) .... As needed 13)  Ibuprofen 400 Mg Tabs (Ibuprofen) .... As needed only  Other Orders: Venipuncture (25366) TLB-Lipid Panel (80061-LIPID) TLB-BMP (Basic Metabolic Panel-BMET) (44034-VQQVZDG) TLB-CBC Platelet - w/Differential (85025-CBCD) TLB-Hepatic/Liver Function Pnl (80076-HEPATIC) TLB-TSH (Thyroid Stimulating Hormone) (84443-TSH)   Orders Added: 1)  Venipuncture [38756] 2)  TLB-Lipid Panel [80061-LIPID] 3)  TLB-BMP (Basic  Metabolic Panel-BMET) [75449-EEFEOFH] 4)  TLB-CBC Platelet - w/Differential [85025-CBCD] 5)  TLB-Hepatic/Liver Function Pnl [80076-HEPATIC] 6)  TLB-TSH (Thyroid Stimulating Hormone) [84443-TSH] 7)  Radiology Referral [Radiology] 8)  Est. Patient Level IV [21975]  Appended Document: pap only/cbs

## 2010-05-11 ENCOUNTER — Encounter (INDEPENDENT_AMBULATORY_CARE_PROVIDER_SITE_OTHER): Payer: Self-pay | Admitting: *Deleted

## 2010-05-11 ENCOUNTER — Other Ambulatory Visit: Payer: Self-pay | Admitting: Internal Medicine

## 2010-05-11 ENCOUNTER — Encounter: Payer: Self-pay | Admitting: Internal Medicine

## 2010-05-11 ENCOUNTER — Other Ambulatory Visit (INDEPENDENT_AMBULATORY_CARE_PROVIDER_SITE_OTHER): Payer: Managed Care, Other (non HMO)

## 2010-05-11 DIAGNOSIS — R740 Nonspecific elevation of levels of transaminase and lactic acid dehydrogenase [LDH]: Secondary | ICD-10-CM

## 2010-05-11 DIAGNOSIS — R7402 Elevation of levels of lactic acid dehydrogenase (LDH): Secondary | ICD-10-CM

## 2010-05-11 LAB — HEPATIC FUNCTION PANEL
AST: 65 U/L — ABNORMAL HIGH (ref 0–37)
Alkaline Phosphatase: 37 U/L — ABNORMAL LOW (ref 39–117)
Bilirubin, Direct: 0.1 mg/dL (ref 0.0–0.3)
Total Bilirubin: 0.6 mg/dL (ref 0.3–1.2)

## 2010-05-13 ENCOUNTER — Ambulatory Visit
Admission: RE | Admit: 2010-05-13 | Discharge: 2010-05-13 | Disposition: A | Discharge status: Home / Self Care | Payer: Managed Care, Other (non HMO) | Source: Ambulatory Visit | Attending: Family Medicine | Admitting: Family Medicine

## 2010-05-13 DIAGNOSIS — Z1231 Encounter for screening mammogram for malignant neoplasm of breast: Secondary | ICD-10-CM

## 2010-05-13 LAB — CONVERTED CEMR LAB
HCV Ab: NEGATIVE
Hep A IgM: NEGATIVE
Hep B C IgM: NEGATIVE

## 2010-05-16 ENCOUNTER — Ambulatory Visit
Admission: RE | Admit: 2010-05-16 | Discharge: 2010-05-16 | Disposition: A | Discharge status: Home / Self Care | Payer: Managed Care, Other (non HMO) | Source: Ambulatory Visit | Attending: Family Medicine | Admitting: Family Medicine

## 2010-05-17 ENCOUNTER — Other Ambulatory Visit: Payer: Self-pay | Admitting: Family Medicine

## 2010-05-17 DIAGNOSIS — R928 Other abnormal and inconclusive findings on diagnostic imaging of breast: Secondary | ICD-10-CM

## 2010-05-24 ENCOUNTER — Ambulatory Visit
Admission: RE | Admit: 2010-05-24 | Discharge: 2010-05-24 | Disposition: A | Discharge status: Home / Self Care | Payer: Managed Care, Other (non HMO) | Source: Ambulatory Visit | Attending: Family Medicine | Admitting: Family Medicine

## 2010-05-24 ENCOUNTER — Other Ambulatory Visit: Payer: Self-pay | Admitting: Family Medicine

## 2010-05-24 ENCOUNTER — Other Ambulatory Visit: Payer: Self-pay | Admitting: Diagnostic Radiology

## 2010-05-24 ENCOUNTER — Encounter: Payer: Self-pay | Admitting: Family Medicine

## 2010-05-24 DIAGNOSIS — R928 Other abnormal and inconclusive findings on diagnostic imaging of breast: Secondary | ICD-10-CM

## 2010-05-24 LAB — CBC
HCT: 39.4 % (ref 36.0–46.0)
MCH: 31.7 pg (ref 26.0–34.0)
MCV: 91.2 fL (ref 78.0–100.0)
RDW: 12.5 % (ref 11.5–15.5)
WBC: 9.1 10*3/uL (ref 4.0–10.5)

## 2010-05-24 LAB — URINALYSIS, ROUTINE W REFLEX MICROSCOPIC
Leukocytes, UA: NEGATIVE
Nitrite: NEGATIVE
Protein, ur: NEGATIVE mg/dL
Urobilinogen, UA: 0.2 mg/dL (ref 0.0–1.0)

## 2010-05-24 LAB — DIFFERENTIAL
Basophils Absolute: 0.1 10*3/uL (ref 0.0–0.1)
Basophils Relative: 1 % (ref 0–1)
Monocytes Relative: 5 % (ref 3–12)
Neutro Abs: 5.5 10*3/uL (ref 1.7–7.7)
Neutrophils Relative %: 60 % (ref 43–77)

## 2010-05-24 LAB — COMPREHENSIVE METABOLIC PANEL
Alkaline Phosphatase: 34 U/L — ABNORMAL LOW (ref 39–117)
BUN: 10 mg/dL (ref 6–23)
Glucose, Bld: 106 mg/dL — ABNORMAL HIGH (ref 70–99)
Potassium: 4.1 mEq/L (ref 3.5–5.1)
Total Bilirubin: 1 mg/dL (ref 0.3–1.2)
Total Protein: 7.1 g/dL (ref 6.0–8.3)

## 2010-05-24 LAB — URINE MICROSCOPIC-ADD ON

## 2010-05-24 LAB — POCT PREGNANCY, URINE: Preg Test, Ur: NEGATIVE

## 2010-05-25 ENCOUNTER — Other Ambulatory Visit: Payer: Self-pay | Admitting: Family Medicine

## 2010-05-25 DIAGNOSIS — C50912 Malignant neoplasm of unspecified site of left female breast: Secondary | ICD-10-CM

## 2010-05-29 ENCOUNTER — Ambulatory Visit
Admission: RE | Admit: 2010-05-29 | Discharge: 2010-05-29 | Disposition: A | Discharge status: Home / Self Care | Payer: Managed Care, Other (non HMO) | Source: Ambulatory Visit | Attending: Family Medicine | Admitting: Family Medicine

## 2010-05-29 DIAGNOSIS — C50912 Malignant neoplasm of unspecified site of left female breast: Secondary | ICD-10-CM

## 2010-05-29 MED ORDER — GADOBENATE DIMEGLUMINE 529 MG/ML IV SOLN
20.0000 mL | Freq: Once | INTRAVENOUS | Status: AC | PRN
  Administered 2010-05-29: 20 mL via INTRAVENOUS

## 2010-05-30 ENCOUNTER — Other Ambulatory Visit: Payer: Self-pay | Admitting: Family Medicine

## 2010-05-30 DIAGNOSIS — R928 Other abnormal and inconclusive findings on diagnostic imaging of breast: Secondary | ICD-10-CM

## 2010-05-31 ENCOUNTER — Ambulatory Visit
Admission: RE | Admit: 2010-05-31 | Discharge: 2010-05-31 | Disposition: A | Discharge status: Home / Self Care | Payer: Managed Care, Other (non HMO) | Source: Ambulatory Visit | Attending: Family Medicine | Admitting: Family Medicine

## 2010-05-31 ENCOUNTER — Other Ambulatory Visit: Payer: Self-pay | Admitting: Diagnostic Radiology

## 2010-05-31 ENCOUNTER — Other Ambulatory Visit: Payer: Managed Care, Other (non HMO)

## 2010-05-31 DIAGNOSIS — R928 Other abnormal and inconclusive findings on diagnostic imaging of breast: Secondary | ICD-10-CM

## 2010-06-01 ENCOUNTER — Other Ambulatory Visit: Payer: Self-pay | Admitting: Oncology

## 2010-06-01 ENCOUNTER — Encounter (HOSPITAL_BASED_OUTPATIENT_CLINIC_OR_DEPARTMENT_OTHER): Payer: Managed Care, Other (non HMO) | Admitting: Oncology

## 2010-06-01 DIAGNOSIS — C50919 Malignant neoplasm of unspecified site of unspecified female breast: Secondary | ICD-10-CM

## 2010-06-01 DIAGNOSIS — C50419 Malignant neoplasm of upper-outer quadrant of unspecified female breast: Secondary | ICD-10-CM

## 2010-06-03 ENCOUNTER — Encounter: Payer: Managed Care, Other (non HMO) | Admitting: Genetic Counselor

## 2010-06-03 ENCOUNTER — Encounter (HOSPITAL_COMMUNITY)
Admission: RE | Admit: 2010-06-03 | Discharge: 2010-06-03 | Disposition: A | Discharge status: Home / Self Care | Payer: Managed Care, Other (non HMO) | Source: Ambulatory Visit | Attending: Oncology | Admitting: Oncology

## 2010-06-03 ENCOUNTER — Other Ambulatory Visit (HOSPITAL_COMMUNITY): Payer: Self-pay | Admitting: Surgery

## 2010-06-03 ENCOUNTER — Encounter (HOSPITAL_COMMUNITY): Payer: Self-pay

## 2010-06-03 DIAGNOSIS — C50919 Malignant neoplasm of unspecified site of unspecified female breast: Secondary | ICD-10-CM

## 2010-06-03 DIAGNOSIS — C50912 Malignant neoplasm of unspecified site of left female breast: Secondary | ICD-10-CM

## 2010-06-03 MED ORDER — FLUDEOXYGLUCOSE F - 18 (FDG) INJECTION
17.9000 | Freq: Once | INTRAVENOUS | Status: AC | PRN
  Administered 2010-06-03: 17.9 via INTRAVENOUS

## 2010-06-07 ENCOUNTER — Encounter: Payer: Self-pay | Admitting: Internal Medicine

## 2010-06-07 ENCOUNTER — Ambulatory Visit (INDEPENDENT_AMBULATORY_CARE_PROVIDER_SITE_OTHER): Payer: Managed Care, Other (non HMO) | Admitting: Internal Medicine

## 2010-06-07 ENCOUNTER — Encounter (INDEPENDENT_AMBULATORY_CARE_PROVIDER_SITE_OTHER): Payer: Self-pay | Admitting: *Deleted

## 2010-06-07 VITALS — BP 146/98 | HR 64 | Temp 97.7°F

## 2010-06-07 DIAGNOSIS — R7402 Elevation of levels of lactic acid dehydrogenase (LDH): Secondary | ICD-10-CM

## 2010-06-07 DIAGNOSIS — N390 Urinary tract infection, site not specified: Secondary | ICD-10-CM

## 2010-06-07 DIAGNOSIS — R109 Unspecified abdominal pain: Secondary | ICD-10-CM

## 2010-06-07 DIAGNOSIS — C50919 Malignant neoplasm of unspecified site of unspecified female breast: Secondary | ICD-10-CM

## 2010-06-07 DIAGNOSIS — R1032 Left lower quadrant pain: Secondary | ICD-10-CM

## 2010-06-07 LAB — POCT URINALYSIS DIPSTICK
Bilirubin, UA: NEGATIVE
Glucose, UA: NEGATIVE
Ketones, UA: NEGATIVE
Spec Grav, UA: 1.025

## 2010-06-07 MED ORDER — METRONIDAZOLE 500 MG PO TABS: 500.0000 mg | ORAL_TABLET | Freq: Three times a day (TID) | ORAL | Status: AC

## 2010-06-07 MED ORDER — HYDROCODONE-ACETAMINOPHEN 7.5-500 MG PO TABS: 1.0000 | ORAL_TABLET | ORAL | Status: DC | PRN

## 2010-06-07 MED ORDER — CIPROFLOXACIN HCL 500 MG PO TABS: 500.0000 mg | ORAL_TABLET | Freq: Two times a day (BID) | ORAL | Status: AC

## 2010-06-07 NOTE — Progress Notes (Signed)
  Subjective:    Patient ID: [name], female    DOB: [birth year], 45 y.o.   MRN: [id number]  HPI    she presents with the acute onset of left lower quadrant abdominal pain early this morning associated with nausea and vomiting at least 4 occasions.  The pain begins in the left inguinal area and can radiate up into the left lateral abdomen. It's described a sharp and burning.  The abdominal pain is described as almost constant; she has intermittent left flank pain as well.   she has had sweating with the pain but denies chills or fever. She also denies dysuria, hematuria or pyuria.   she is just starting her menses. She was seen in the emergency room 02/20/2010 with a similar problem while having menses. Those labs were reviewed; her AST was 56 and her ALT 120 at that time. She states that she did not receive a diagnosis; no meds were Rxed. An ultrasound was performed of the abdomen in December 2011 ; this revealed no significant hepatic abnormalities.    she still has her appendix; she has no history of nephrolithiasis. Family health  history is unknown  as she is adopted.    her liver function tests have been elevated recently but appeared to be decreasing slightly. Specifically on 2/21 AST was 80 and ALT 149. On 2/29 the AST was 65 and ALT 120. Hepatitis studies were negative.   she is scheduled for mastectomy by Dr. Alphonsa Overall April 10.  Her oncologist has question need for further hepatic workup & specifically questioned had she ever seen a hepatologist or had a liver biopsy.          Review of Systems     Objective:   Physical Exam on exam she appears uncomfortable and markedly fatigued. She is in no acute distress.  She has no scleral icterus; oral pharynx reveals no erythema.  Chest is clear. She has an S4 without murmurs or gallops.   Abdomen is protuberant; there is tenderness in the left lower quadrant in the inguinal area and along the left lateral abdomen.    There is no tenderness over the area of the liver.   skin reveals no lesions, rashes or jaundice.    She has no lymphadenopathy but head neck or axilla.   Strength is normal as are deep tendon reflexes. There is negative straight leg raising.    urinalysis reveals some red cells and leukocytes. I          Assessment & Plan:   #1 abdominal pain ; this is mainly localized left lower quadrant. Acute diverticulitis should be ruled out. Additionally with concomitant back discomfort and minimal pain nephrolithiasis must be considered.    #2 elevated liver function test essentially stable,? etiology   #3 breast cancer; mastectomy scheduled April 10 with Dr. Lucia Gaskins.    Plan : #1 urine for culture and sensitivity; metronidazole and ciprofloxacin orally. She'll be asked to strain her urine. Tramadol will be prescribed for pain.   #2 GI referral to assess chronically elevated hepatic enzymes.

## 2010-06-07 NOTE — Patient Instructions (Signed)
Please strain your urine ; retrieve and save any stones. Please share a copy of these records with all physicians seen.

## 2010-06-09 ENCOUNTER — Emergency Department (HOSPITAL_COMMUNITY)
Admission: EM | Admit: 2010-06-09 | Discharge: 2010-06-10 | Disposition: A | Discharge status: Home / Self Care | Payer: Managed Care, Other (non HMO) | Attending: Emergency Medicine | Admitting: Emergency Medicine

## 2010-06-09 ENCOUNTER — Telehealth: Payer: Self-pay

## 2010-06-09 DIAGNOSIS — R300 Dysuria: Secondary | ICD-10-CM | POA: Insufficient documentation

## 2010-06-09 DIAGNOSIS — R109 Unspecified abdominal pain: Secondary | ICD-10-CM | POA: Insufficient documentation

## 2010-06-09 DIAGNOSIS — R1909 Other intra-abdominal and pelvic swelling, mass and lump: Secondary | ICD-10-CM | POA: Insufficient documentation

## 2010-06-09 DIAGNOSIS — K7689 Other specified diseases of liver: Secondary | ICD-10-CM | POA: Insufficient documentation

## 2010-06-09 DIAGNOSIS — I10 Essential (primary) hypertension: Secondary | ICD-10-CM | POA: Insufficient documentation

## 2010-06-09 DIAGNOSIS — Z853 Personal history of malignant neoplasm of breast: Secondary | ICD-10-CM | POA: Insufficient documentation

## 2010-06-09 LAB — URINE CULTURE

## 2010-06-09 NOTE — Telephone Encounter (Signed)
Message copied by Annamaria Helling on Thu Jun 09, 2010 11:23 AM ------      Message from: Unice Cobble      Created: Thu Jun 09, 2010  8:16 AM       No significant urinary tract infection present; significant would be > 100,000 colonies of one bacteria , not multiple species. [employer]

## 2010-06-09 NOTE — Telephone Encounter (Signed)
No significant urinary tract infection present; significant would be > 100,000 colonies of one bacteria , not multiple species. [name] Patient aware of results and indicates she is back at work and feels slightly better

## 2010-06-10 ENCOUNTER — Emergency Department (HOSPITAL_COMMUNITY): Payer: Managed Care, Other (non HMO)

## 2010-06-10 ENCOUNTER — Encounter (HOSPITAL_COMMUNITY): Payer: Self-pay

## 2010-06-10 LAB — URINALYSIS, ROUTINE W REFLEX MICROSCOPIC
Glucose, UA: NEGATIVE mg/dL
Nitrite: NEGATIVE
Protein, ur: NEGATIVE mg/dL
pH: 6 (ref 5.0–8.0)

## 2010-06-10 LAB — DIFFERENTIAL
Basophils Absolute: 0 10*3/uL (ref 0.0–0.1)
Basophils Relative: 0 % (ref 0–1)
Neutro Abs: 9.7 10*3/uL — ABNORMAL HIGH (ref 1.7–7.7)
Neutrophils Relative %: 73 % (ref 43–77)

## 2010-06-10 LAB — COMPREHENSIVE METABOLIC PANEL
AST: 47 U/L — ABNORMAL HIGH (ref 0–37)
Albumin: 3.5 g/dL (ref 3.5–5.2)
Chloride: 98 mEq/L (ref 96–112)
Creatinine, Ser: 0.74 mg/dL (ref 0.4–1.2)
GFR calc Af Amer: >60 mL/min (ref >60)
Total Bilirubin: 1 mg/dL (ref 0.3–1.2)
Total Protein: 6.7 g/dL (ref 6.0–8.3)

## 2010-06-10 LAB — CBC
Hemoglobin: 11.4 g/dL — ABNORMAL LOW (ref 12.0–15.0)
MCHC: 33.7 g/dL (ref 30.0–36.0)
Platelets: 296 10*3/uL (ref 150–400)
RBC: 3.72 MIL/uL — ABNORMAL LOW (ref 3.87–5.11)

## 2010-06-11 ENCOUNTER — Other Ambulatory Visit: Payer: Self-pay | Admitting: Internal Medicine

## 2010-06-13 ENCOUNTER — Encounter: Payer: Self-pay | Admitting: Family Medicine

## 2010-06-14 NOTE — Letter (Signed)
Summary: New Patient letter  [facility]  [address] [place], [place] [address]   Phone: [phone]  Fax: [phone]       06/07/2010 MRN: [id number]  [name] [address] [place], [place]  [address]  [place]  Dear Ms. CHANDA,  Welcome to the Gastroenterology Division at [facility].    You are scheduled to see Dr.  Fuller Plan on 06-29-10 at 10:45A.M. on the 3rd floor at [facility], [place] [employer].  We ask that you try to arrive at our office 15 minutes prior to your appointment time to allow for check-in.  We would like you to complete the enclosed self-administered evaluation form prior to your visit and bring it with you on the day of your appointment.  We will review it with you.  Also, please bring a complete list of all your medications or, if you prefer, bring the medication bottles and we will list them.  Please bring your insurance card so that we may make a copy of it.  If your insurance requires a referral to see a specialist, please bring your referral form from your primary care physician.  Co-payments are due at the time of your visit and may be paid by cash, check or credit card.     Your office visit will consist of a consult with your physician (includes a physical exam), any laboratory testing he/she may order, scheduling of any necessary diagnostic testing (e.g. x-ray, ultrasound, CT-scan), and scheduling of a procedure (e.g. Endoscopy, Colonoscopy) if required.  Please allow enough time on your schedule to allow for any/all of these possibilities.    If you cannot keep your appointment, please call [phone] to cancel or reschedule prior to your appointment date.  This allows [place] the opportunity to schedule an appointment for another patient in need of care.  If you do not cancel or reschedule by 5 p.m. the business day prior to your appointment date, you will be charged a $50.00 late cancellation/no-show fee.    Thank you for choosing  [facility] for your medical needs.  We appreciate the opportunity to care for you.  Please visit [place] at our website  to learn more about our practice.                     Sincerely,                                                             The Gastroenterology Division

## 2010-06-16 ENCOUNTER — Other Ambulatory Visit (HOSPITAL_COMMUNITY): Payer: Self-pay | Admitting: Surgery

## 2010-06-16 ENCOUNTER — Ambulatory Visit (HOSPITAL_COMMUNITY)
Admission: RE | Admit: 2010-06-16 | Discharge: 2010-06-16 | Disposition: A | Discharge status: Home / Self Care | Payer: Managed Care, Other (non HMO) | Source: Ambulatory Visit | Attending: Surgery | Admitting: Surgery

## 2010-06-16 ENCOUNTER — Encounter (HOSPITAL_COMMUNITY)
Admission: RE | Admit: 2010-06-16 | Discharge: 2010-06-16 | Disposition: A | Discharge status: Home / Self Care | Payer: Managed Care, Other (non HMO) | Source: Ambulatory Visit | Attending: Surgery | Admitting: Surgery

## 2010-06-16 DIAGNOSIS — Z01812 Encounter for preprocedural laboratory examination: Secondary | ICD-10-CM | POA: Insufficient documentation

## 2010-06-16 DIAGNOSIS — C50919 Malignant neoplasm of unspecified site of unspecified female breast: Secondary | ICD-10-CM

## 2010-06-16 DIAGNOSIS — Z01818 Encounter for other preprocedural examination: Secondary | ICD-10-CM | POA: Insufficient documentation

## 2010-06-16 DIAGNOSIS — Z01810 Encounter for preprocedural cardiovascular examination: Secondary | ICD-10-CM | POA: Insufficient documentation

## 2010-06-16 LAB — BASIC METABOLIC PANEL
BUN: 9 mg/dL (ref 6–23)
CO2: 28 mEq/L (ref 19–32)
Chloride: 101 mEq/L (ref 96–112)
Glucose, Bld: 85 mg/dL (ref 70–99)
Potassium: 4.7 mEq/L (ref 3.5–5.1)
Sodium: 136 mEq/L (ref 135–145)

## 2010-06-16 LAB — CBC
HCT: 34.1 % — ABNORMAL LOW (ref 36.0–46.0)
Hemoglobin: 11.8 g/dL — ABNORMAL LOW (ref 12.0–15.0)
MCH: 30.6 pg (ref 26.0–34.0)
MCV: 88.3 fL (ref 78.0–100.0)
Platelets: 464 10*3/uL — ABNORMAL HIGH (ref 150–400)
RBC: 3.86 MIL/uL — ABNORMAL LOW (ref 3.87–5.11)
WBC: 10.2 10*3/uL (ref 4.0–10.5)

## 2010-06-21 ENCOUNTER — Observation Stay (HOSPITAL_COMMUNITY)
Admission: RE | Admit: 2010-06-21 | Discharge: 2010-06-22 | Disposition: A | Discharge status: Home / Self Care | Payer: Managed Care, Other (non HMO) | Source: Ambulatory Visit | Attending: Surgery | Admitting: Surgery

## 2010-06-21 ENCOUNTER — Other Ambulatory Visit: Payer: Self-pay | Admitting: Surgery

## 2010-06-21 ENCOUNTER — Ambulatory Visit (HOSPITAL_COMMUNITY)
Admission: RE | Admit: 2010-06-21 | Discharge: 2010-06-21 | Disposition: A | Discharge status: Home / Self Care | Payer: Managed Care, Other (non HMO) | Source: Ambulatory Visit | Attending: Surgery | Admitting: Surgery

## 2010-06-21 DIAGNOSIS — J45909 Unspecified asthma, uncomplicated: Secondary | ICD-10-CM | POA: Insufficient documentation

## 2010-06-21 DIAGNOSIS — C50912 Malignant neoplasm of unspecified site of left female breast: Secondary | ICD-10-CM

## 2010-06-21 DIAGNOSIS — K219 Gastro-esophageal reflux disease without esophagitis: Secondary | ICD-10-CM | POA: Insufficient documentation

## 2010-06-21 DIAGNOSIS — F17200 Nicotine dependence, unspecified, uncomplicated: Secondary | ICD-10-CM | POA: Insufficient documentation

## 2010-06-21 DIAGNOSIS — C50919 Malignant neoplasm of unspecified site of unspecified female breast: Principal | ICD-10-CM | POA: Insufficient documentation

## 2010-06-21 HISTORY — PX: MASTECTOMY: SHX3

## 2010-06-21 MED ORDER — TECHNETIUM TC 99M SULFUR COLLOID FILTERED
1.0000 | Freq: Once | INTRAVENOUS | Status: AC | PRN
  Administered 2010-06-21: 1 via INTRADERMAL

## 2010-06-25 ENCOUNTER — Emergency Department (HOSPITAL_COMMUNITY): Payer: Managed Care, Other (non HMO)

## 2010-06-25 ENCOUNTER — Inpatient Hospital Stay (HOSPITAL_COMMUNITY)
Admission: EM | Admit: 2010-06-25 | Discharge: 2010-06-28 | DRG: 390 | Disposition: A | Discharge status: Home / Self Care | Payer: Managed Care, Other (non HMO) | Attending: Surgery | Admitting: Surgery

## 2010-06-25 DIAGNOSIS — J45909 Unspecified asthma, uncomplicated: Secondary | ICD-10-CM | POA: Diagnosis present

## 2010-06-25 DIAGNOSIS — Z87891 Personal history of nicotine dependence: Secondary | ICD-10-CM

## 2010-06-25 DIAGNOSIS — F988 Other specified behavioral and emotional disorders with onset usually occurring in childhood and adolescence: Secondary | ICD-10-CM | POA: Diagnosis present

## 2010-06-25 DIAGNOSIS — E7800 Pure hypercholesterolemia, unspecified: Secondary | ICD-10-CM | POA: Diagnosis present

## 2010-06-25 DIAGNOSIS — R1900 Intra-abdominal and pelvic swelling, mass and lump, unspecified site: Secondary | ICD-10-CM | POA: Diagnosis present

## 2010-06-25 DIAGNOSIS — I10 Essential (primary) hypertension: Secondary | ICD-10-CM | POA: Diagnosis present

## 2010-06-25 DIAGNOSIS — C50919 Malignant neoplasm of unspecified site of unspecified female breast: Secondary | ICD-10-CM | POA: Diagnosis present

## 2010-06-25 DIAGNOSIS — K219 Gastro-esophageal reflux disease without esophagitis: Secondary | ICD-10-CM | POA: Diagnosis present

## 2010-06-25 DIAGNOSIS — Z882 Allergy status to sulfonamides status: Secondary | ICD-10-CM

## 2010-06-25 DIAGNOSIS — Z9010 Acquired absence of unspecified breast and nipple: Secondary | ICD-10-CM

## 2010-06-25 DIAGNOSIS — K7689 Other specified diseases of liver: Secondary | ICD-10-CM | POA: Diagnosis present

## 2010-06-25 DIAGNOSIS — K56609 Unspecified intestinal obstruction, unspecified as to partial versus complete obstruction: Principal | ICD-10-CM | POA: Diagnosis present

## 2010-06-25 LAB — COMPREHENSIVE METABOLIC PANEL
Albumin: 3.7 g/dL (ref 3.5–5.2)
Alkaline Phosphatase: 44 U/L (ref 39–117)
BUN: 14 mg/dL (ref 6–23)
Chloride: 100 mEq/L (ref 96–112)
Creatinine, Ser: 0.86 mg/dL (ref 0.4–1.2)
GFR calc non Af Amer: >60 mL/min (ref >60)
Glucose, Bld: 131 mg/dL — ABNORMAL HIGH (ref 70–99)
Potassium: 4.3 mEq/L (ref 3.5–5.1)
Total Bilirubin: 0.6 mg/dL (ref 0.3–1.2)

## 2010-06-25 LAB — CBC
HCT: 37.5 % (ref 36.0–46.0)
MCH: 29.7 pg (ref 26.0–34.0)
MCV: 87 fL (ref 78.0–100.0)
Platelets: 549 10*3/uL — ABNORMAL HIGH (ref 150–400)
RBC: 4.31 MIL/uL (ref 3.87–5.11)
WBC: 14.1 10*3/uL — ABNORMAL HIGH (ref 4.0–10.5)

## 2010-06-25 LAB — DIFFERENTIAL
Eosinophils Absolute: 0 10*3/uL (ref 0.0–0.7)
Eosinophils Relative: 0 % (ref 0–5)
Lymphocytes Relative: 10 % — ABNORMAL LOW (ref 12–46)
Lymphs Abs: 1.5 10*3/uL (ref 0.7–4.0)
Monocytes Relative: 3 % (ref 3–12)

## 2010-06-25 LAB — LIPASE, BLOOD: Lipase: 20 U/L (ref 11–59)

## 2010-06-26 ENCOUNTER — Inpatient Hospital Stay (HOSPITAL_COMMUNITY): Payer: Managed Care, Other (non HMO)

## 2010-06-26 LAB — BASIC METABOLIC PANEL
BUN: 7 mg/dL (ref 6–23)
Chloride: 106 mEq/L (ref 96–112)
GFR calc Af Amer: >60 mL/min (ref >60)
GFR calc non Af Amer: >60 mL/min (ref >60)
Potassium: 3.9 mEq/L (ref 3.5–5.1)
Sodium: 138 mEq/L (ref 135–145)

## 2010-06-26 LAB — CBC
MCV: 87.3 fL (ref 78.0–100.0)
Platelets: 465 10*3/uL — ABNORMAL HIGH (ref 150–400)
RBC: 3.77 MIL/uL — ABNORMAL LOW (ref 3.87–5.11)
RDW: 12.8 % (ref 11.5–15.5)
WBC: 8.6 10*3/uL (ref 4.0–10.5)

## 2010-06-27 ENCOUNTER — Inpatient Hospital Stay (HOSPITAL_COMMUNITY): Payer: Managed Care, Other (non HMO)

## 2010-06-27 NOTE — Discharge Summary (Signed)
NAMEHYDIE, LANGAN                ACCOUNT NO.:  [id number]  MEDICAL RECORD NO.:  [id number]           PATIENT TYPE:  O  LOCATION:  5150                         FACILITY:  [facility]  PHYSICIAN:  Fenton Malling. Lucia Gaskins, M.D.  DATE OF BIRTH:  07-14-1965  DATE OF ADMISSION:  06/21/2010 DATE OF DISCHARGE:  06/22/2010                              DISCHARGE SUMMARY   DISCHARGE DIAGNOSES: 1. Left breast cancer, T3N0 with final pathology pending. 2. Hypertension. 3. Hypercholesterolemia. 4. Gastroesophageal reflux disease. 5. Asthma, on inhalers. 6. History of smoking cigarettes. 7. Morbidly obese. 8. Attention deficit hyperactivity disorder. 9. Pelvic mass, presumably ovarian in orgin.  OPERATIONS PERFORMED:  The patient underwent a left simple mastectomy with left axillary sentinel lymph node biopsy on June 21, 2010 by Dr. Alphonsa Overall.  HISTORY OF PRESENT ILLNESS:  This is a 45 year old white female who sees Dr. Unice Cobble as her primary medical doctor, Dr. Garnet Koyanagi from a GYN standpoint, who presented with a biopsy-proven left breast cancer.  She was presented at the [facility] with Drs. Thea Silversmith and Dr. Eston Esters.  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  She also had an abnormal axillary lymph node which had been biopsied but was negative and this will be included in the central lymph node biopsy.  Her comorbid problems included: 1. Hypertension. 2. Hypercholesterolemia. 3. Gastroesophageal reflux disease. 4. Asthma, on inhalers. 5. History of smoking cigarettes, she knows she needs to quit. 6. Morbid obesity with a BMI of 46.9. 7. ADHD. 8. Probable large left ovarian mass and has been causing some abdominal pain.  The plan is to address this after her mastectomy has healed.  HOSPITAL COURSE:  On the day of admission,  she was taken to the operating room where she underwent a left mastectomy and left axillary sentinel lymph node biopsy.  At the time of surgery, the actual left axillary sentinel lymph node biopsy was touch prep negative.  She is now 1 day postop, has done well with the surgery, is having minimal drainage from her 2 drains and is ready for discharge.  DISCHARGE INSTRUCTIONS:  Resuming her home medications which included: 1. Advair. 2. Albuterol. 3. She can restart Aleve. 4. Loratadine. 5. She can use ReQuip. 6. Triamterene hydrochlorothiazide. 7. Verapamil.  Her discharge instructions also included that she could drive after 4-5 days.  Her diet was unrestricted.  She is to leave the bandage until Friday, June 24, 2010 and then can remove and start showering.  She is to see Dr. Alphonsa Overall back in the office in 1 week for a followup appointment and review of pathology and she has been given Vicodin for pain.  Her discharge condition is good.  Again, her final pathology is pending at the time of dictation.   Fenton Malling. Lucia Gaskins, M.D., FACS   DHN/MEDQ  D:  06/22/2010  T:  06/22/2010  Job:  [id number]  cc:   Darrick Penna. Linna Darner, MD,FACP,FCCP Kendrick Fries  Salem Caster, DO Thea Silversmith, M.D. Eston Esters, M.D., F.R.C.P.C.  Electronically Signed by Alphonsa Overall M.D. on 06/26/2010 12:39:23 PM

## 2010-06-27 NOTE — Op Note (Signed)
[name], [name] NO.:  [id number]  MEDICAL RECORD NO.:  [id number]           PATIENT TYPE:  LOCATION:                                 FACILITY:  PHYSICIAN:  Fenton Malling. [name], M.D.  DATE OF BIRTH:  07-29-1965  DATE OF PROCEDURE:                              OPERATIVE REPORT  PREOPERATIVE DIAGNOSIS:  Left breast cancer.  POSTOPERATIVE DIAGNOSIS:  Left breast cancer, Clinical T3, N0 carcinoma with negative sentinel lymph node per Dr. Enid Cutter (final path pending).  PROCEDURES:  Left simple mastectomy, injection of left breast with methylene blue, left axillary sentinel lymph node biopsy.  SURGEON:  Fenton Malling. Alexa Gaskins, MD  FIRST ASSISTANT:  None.  ANESTHESIA:  General endotracheal.  ESTIMATED BLOOD LOSS:  150 mL.  DRAINS LEFT IN:  Two #19 Blake drains.  INDICATIONS FOR PROCEDURE:  [name] is a 45 year old white female who [facility] on June 01, 2010, with a biopsy-proven left breast cancer.  The abnormal area in the left breast was fairly wide spread and it was unclear how much of this was DCIS and how much of this was invasive carcinoma.  It was felt that the best first step was to proceed with a left mastectomy rather than a neoadjuvant therapy.  She also had an abnormal left axillary lymph node seen on MRI, but the biopsy was negative.  The indications and potential complications of surgery were explained to the patient.  Potential complications include but are not limited to, bleeding, infection, nerve injury, recurrence of tumor, and the need for more surgery.  OPERATIVE NOTE:  The patient was placed in a supine position under general endotracheal anesthetic in room #16.  She had already in the preoperative area had her left breast injected with 1 mCi of technetium sulfur colloid.  I then injected her breast with about 2 mL of 40% methylene blue.  Her left breast was then prepped with a  ChloraPrep, sterilely draped.    A time-out was held and the surgical checklist run.  I made an incision for a simple left mastectomy where my skin incision included the nipple-areolar complex.  I carried my dissection of the breast tissue medially to the edge of the sternum, superiorly to about 2 fingerbreadths below the clavicle, laterally to latissimus dorsi muscle, and inferiorly to the investing fascia of the rectus abdominis muscle.  I then cut down on the left axilla, identified a sentinel lymph node which had counts of 3200.  Background was about 10.  The lymph node was blue.  This was sent to touch prep.  Touch prep by Dr. Lyndon Code revealed no cancer cells.  I then completed the dissection of the breast which included reflecting the breast off the pectoralis major muscle.  A long suture was placed in the lateral aspect of breast and the breast specimen was sent for permanent pathology.  I palpated the middle of the axilla which was unremarkable.  I palpated the area between the pectoralis major and minor which was unremarkable.  I then started closure.  I did have to excise some skin  both superior and which I marked the inferior flap with a long suture and the lateral flap with a short suture and the inferior skin and I marked the inferior skin flap with a long suture and lateral with a short suture.  I then brought out two #19 Blake drains through the inferior stab wounds.  I sewed these in place with 2-0 nylon suture.  I then closed the skin with interrupted 3-0 Vicryl sutures.  I had a little bit of a dog ear on the medial and lateral aspects which I excised some extra skin.  I closed subcutaneous tissues with 3-0 Vicryl sutures, the skin itself with a running 4-0 Monocryl suture.  I painted the wound with a tincture of benzoin and steri-stripped the wound.  The patient tolerated the procedure well and was transported to the recovery room in good condition.    Sponge  and needle counts were correct at the end of the case.  The final pathology is pending at the time of this dictation. She has a known cystic pelvic mass which had to be addressed later when she recovers from the surgery.   Fenton Malling. [name], M.D., FACS   DHN/MEDQ  D:  06/21/2010  T:  06/22/2010  Job:  [id number]  cc:   Eston Esters, M.D., F.R.C.P.C. Thea Silversmith, M.D. Rosalita Chessman, DO Darrick Penna Linna Darner, MD,FACP,FCCP  Electronically Signed by Alphonsa Overall M.D. on 06/26/2010 05:37:37 PM

## 2010-06-28 ENCOUNTER — Encounter (HOSPITAL_BASED_OUTPATIENT_CLINIC_OR_DEPARTMENT_OTHER): Payer: Managed Care, Other (non HMO) | Admitting: Oncology

## 2010-06-28 ENCOUNTER — Other Ambulatory Visit: Payer: Self-pay | Admitting: Oncology

## 2010-06-28 DIAGNOSIS — C50419 Malignant neoplasm of upper-outer quadrant of unspecified female breast: Secondary | ICD-10-CM

## 2010-06-28 LAB — CBC WITH DIFFERENTIAL/PLATELET
BASO%: 0.3 % (ref 0.0–2.0)
Eosinophils Absolute: 0.2 10*3/uL (ref 0.0–0.5)
HCT: 34.4 % — ABNORMAL LOW (ref 34.8–46.6)
HGB: 11.9 g/dL (ref 11.6–15.9)
LYMPH%: 28.8 % (ref 14.0–49.7)
MCHC: 34.6 g/dL (ref 31.5–36.0)
MONO#: 0.5 10*3/uL (ref 0.1–0.9)
NEUT#: 4.7 10*3/uL (ref 1.5–6.5)
NEUT%: 62.1 % (ref 38.4–76.8)
Platelets: 492 10*3/uL — ABNORMAL HIGH (ref 145–400)
WBC: 7.6 10*3/uL (ref 3.9–10.3)
lymph#: 2.2 10*3/uL (ref 0.9–3.3)

## 2010-06-28 LAB — COMPREHENSIVE METABOLIC PANEL
ALT: 50 U/L — ABNORMAL HIGH (ref 0–35)
CO2: 24 mEq/L (ref 19–32)
Calcium: 9.5 mg/dL (ref 8.4–10.5)
Chloride: 102 mEq/L (ref 96–112)
Creatinine, Ser: 0.81 mg/dL (ref 0.40–1.20)
Glucose, Bld: 104 mg/dL — ABNORMAL HIGH (ref 70–99)
Total Protein: 6.9 g/dL (ref 6.0–8.3)

## 2010-06-29 ENCOUNTER — Encounter: Payer: Self-pay | Admitting: Gastroenterology

## 2010-06-29 ENCOUNTER — Other Ambulatory Visit (INDEPENDENT_AMBULATORY_CARE_PROVIDER_SITE_OTHER): Payer: Managed Care, Other (non HMO)

## 2010-06-29 ENCOUNTER — Other Ambulatory Visit: Payer: Self-pay | Admitting: Gastroenterology

## 2010-06-29 ENCOUNTER — Ambulatory Visit (INDEPENDENT_AMBULATORY_CARE_PROVIDER_SITE_OTHER): Payer: Managed Care, Other (non HMO) | Admitting: Gastroenterology

## 2010-06-29 VITALS — BP 128/74 | HR 62 | Ht 64.0 in | Wt 251.0 lb

## 2010-06-29 LAB — PROTIME-INR
INR: 1.2 ratio — ABNORMAL HIGH (ref 0.8–1.0)
Prothrombin Time: 13.2 s — ABNORMAL HIGH (ref 10.2–12.4)

## 2010-06-29 NOTE — Patient Instructions (Signed)
Please go directly to the basement to have your labs drawn. We will contact when we receive the results.  Cc: Unice Cobble, MD

## 2010-06-29 NOTE — Progress Notes (Signed)
History of Present Illness: This is a 45 year old female here today with her mother. She is referred for evaluation of abnormal liver function tests and a recent partial bowel obstruction. She had mild elevation of her transaminases with an AST of 80 and an ALT of 149 noted 2 months ago her liver function tests have gradually improved and her most recent LFTs were normal except for an ALT of 50. She was recently diagnosed with breast cancer and underwent a left mastectomy 8 days ago. She was recently hospitalized with what appeared to be a partial bowel obstruction likely secondary to her pelvic mass.  Imaging studies included a PET scan of the abdomen and pelvis which revealed fatty infiltration of the liver no other hepatic or biliary abnormalities. She has an 18 cm cystic solid mass associated with her left adnexa that is being further evaluated. She has no prior history of liver disease and denies heavy alcohol usage. No family history of liver disease. Acute hepatitis A, B, and C serologies performed on February 29 were all negative.   Past Medical History  Diagnosis Date  . Breast cancer     left  . ADD (attention deficit disorder with hyperactivity)   . Allergic rhinitis   . Asthma   . Hypertension   . Hyperlipidemia   . GERD (gastroesophageal reflux disease)   . Morbid obesity   . RLS (restless legs syndrome)   . OSA (obstructive sleep apnea)   . Depression   . Bowel obstruction 06-25-2010  . Ovarian mass    Past Surgical History  Procedure Date  . Mastectomy 06-21-2010    Left     reports that she has quit smoking. She has never used smokeless tobacco. She reports that she does not drink alcohol or use illicit drugs. family history is not on file.  She is adopted. Allergies  Allergen Reactions  . Metoprolol     REACTION: cp  . Sulfonamide Derivatives    Outpatient Encounter Prescriptions as of 06/29/2010  Medication Sig Dispense Refill  . albuterol (PROVENTIL,VENTOLIN) 90  MCG/ACT inhaler Inhale 2 puffs into the lungs every 6 (six) hours as needed.        . calcium carbonate (OS-CAL) 600 MG TABS Take 600 mg by mouth daily.        . cetirizine (ZYRTEC) 10 MG tablet Take 10 mg by mouth daily.        . citalopram (CELEXA) 20 MG tablet Take 20 mg by mouth daily.        . Famotidine-Ca Carb-Mag Hydrox (ACID CONTROLLER COMPLETE PO) Take by mouth. One daily       . Fluticasone-Salmeterol (ADVAIR DISKUS) 250-50 MCG/DOSE AEPB Inhale 1 puff into the lungs every 12 (twelve) hours.        Marland Kitchen HYDROcodone-acetaminophen (LORTAB) 7.5-500 MG per tablet Take 1 tablet by mouth every 4 (four) hours as needed for pain.  30 tablet  0  . ibuprofen (ADVIL,MOTRIN) 400 MG tablet Take 400 mg by mouth as needed.        . Multiple Vitamin (MULTIVITAMIN) tablet Take 1 tablet by mouth daily.        . naproxen sodium (ANAPROX) 220 MG tablet Take 220 mg by mouth as needed.        . Omega-3 Fatty Acids (FISH OIL) 1000 MG CAPS Take 1,000 mg by mouth.        Marland Kitchen rOPINIRole (REQUIP) 0.5 MG tablet Take 0.5 mg by mouth daily.        Marland Kitchen  triamterene-hydrochlorothiazide (MAXZIDE-25) 37.5-25 MG per tablet Take 1 tablet by mouth. 1/2 by mouth       . verapamil (CALAN) 120 MG tablet TAKE 1/2 TABLET TWICE A DAY  30 tablet  5  . Flaxseed, Linseed, (FLAX SEED OIL) 1000 MG CAPS Take 1,000 mg by mouth.        . DISCONTD: verapamil (CALAN-SR) 180 MG CR tablet Take 180 mg by mouth. 1/2 by mouth daily          Review of Systems: Allergies. Pertinent positive and negative review of systems were noted in the above HPI section. All other review of systems were otherwise negative.  Physical Exam: General: Well developed , well nourished, no acute distress. Morbidly obese.  Head: Normocephalic and atraumatic Eyes:  sclerae anicteric, EOMI Ears: Normal auditory acuity Mouth: No deformity or lesions Neck: Supple, no masses or thyromegaly Lungs: Clear throughout to auscultation. Status post left mastectomy  Heart:  Regular rate and rhythm; no murmurs, rubs or bruits Abdomen: Soft, non tender and non distended. No masses, hepatosplenomegaly or hernias noted. Normal Bowel sounds Musculoskeletal: Symmetrical with no gross deformities  Skin: No lesions on visible extremities Pulses:  Normal pulses noted Extremities: No clubbing, cyanosis, edema or deformities noted Neurological: Alert oriented x 4, grossly nonfocal Cervical Nodes:  No significant cervical adenopathy Inguinal Nodes: No significant inguinal adenopathy Psychological:  Alert and cooperative. Normal mood and affect  Assessment and Recommendations:  1. Elevated transaminases. This is likely secondary to fatty infiltration of the liver and/or medication side effects effects. Evaluate for other liver diseases. Blood work as ordered. No further imaging studies indicated at this time. Would monitor liver function tests every 3 months. Consider a liver biopsy if they worsen. A long-term weight loss diet with adequate monitoring of lipids and blood sugars is recommended.  2. GERD. Symptoms controlled with antireflux measures and famotidine. Consider changing to a PPI if her symptoms are not well controlled.  3. Partial small bowel obstruction. Consider further gastrointestinal evaluation after evaluation of her pelvic mass which appears to be the most likely cause at this time.  4. Morbid obesity. Long-term management with her primary physician.   5. Colorectal cancer screening. Average risk for colorectal cancer. Screening colonoscopies beginning at age 50, June 2018.

## 2010-06-30 LAB — ANTI-SMOOTH MUSCLE ANTIBODY, IGG: Smooth Muscle Ab: 6 U (ref <20)

## 2010-07-01 NOTE — Discharge Summary (Signed)
[name], SCHMUTZ                ACCOUNT NO.:  [id number]  MEDICAL RECORD NO.:  [id number]           PATIENT TYPE:  I  LOCATION:  6962                         FACILITY:  [facility]  PHYSICIAN:  Fenton Malling. Lucia Gaskins, M.D.  DATE OF BIRTH:  04/09/1965  DATE OF ADMISSION:  06/25/2010 DATE OF DISCHARGE: 28 June 2010          DISCHARGE SUMMARY  DISCHARGE DIAGNOSES: 1. Partial bowel obstruction better. 2. Pelvic mass by CT scan presumably ovarian in origin. 3. Left breast cancer T1B and 0. 4. Hypertension. 5. Hypercholesteremia. 6. Gastroesophageal reflux disease. 7. Asthma on inhalers. 8. History of smoking cigarettes. 9. Morbidly obese. 10.Attention deficit disorder.  OPERATIONS PERFORMED:  Were none.  HISTORY OF ILLNESS:  This is a 45 year old white female who is a patient of Unice Cobble who has seen Dr. Vanessa Kick from a GYN standpoint.  She presented with a left breast cancer and underwent a left simple mastectomy with axillary sentinel lymph node on June 21, 2010, by Dr. Alphonsa Overall.  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  She had 0/3 lymph nodes involved.  Her tumor was estrogen receptor 78%.  Progesterone receptor is 0%.  Ki67 25% and HER-2/neu was negative.  Before her breast cancer surgery, she had been having abdominal pain and she had been found on CT scan to have a 18.1-cm complex cystic mass in her central pelvis that tracts to the left adnexal region.  This lesion did not have increased uptake with a PET scan and is thought to be probably ovarian in origin.  She has seen Dr. Vanessa Kick preop before her mastectomy, but the decision was to put off any surgery on this mass until after she completed her mastectomy.  Unfortunately after being discharged, she developed increasing abdominal pain, was admitted on June 26, 2010, by Dr. Greer Pickerel my partner for what appeared to be a partial small  bowel obstruction.  She has gotten steadily better with passing flatus, tolerating liquids though she has not had a bowel movement yet and is now ready for discharge.  I have been in touch with Dr. Harrington Challenger who has arranged for her to see the gynecologic oncologist here for further evaluation.  She has spoken I think with Dr. Caswell Corwin, but has not spoken to the physicians yet.  So her discharge instructions will be: 1. She is to follow up with me this Friday, which will be the April     20.  I have removed her medial drain from her chest and I have left     the lateral drain.  She is to continue record that amount of fluid. 2. She is going to follow up with Dr. Geoffery Lyons regarding     evaluation and management of this ovarian mass which I think is the     cause of partial bowel obstruction, though I am not 100% sure.  Her discharge instruction, we will resume her home medications which include: 1. Advair. 2. Albuterol. 3. Loratadine. 4. Requip. 5. Triamterene/hydrochlorothiazide. 6. Verapamil. She is to call for any further problem that she is aware that she could have further problems with partial bowel obstruction prior  to definitive surgery.   Fenton Malling. Lucia Gaskins, M.D., FACS   DHN/MEDQ  D:  06/28/2010  T:  06/28/2010  Job:  [id number]  cc:   Darrick Penna. Linna Darner, MD,FACP,FCCP [place] Harrington Challenger, MD Caswell Corwin, R.N. Thea Silversmith, M.D. Eston Esters, M.D., F.R.C.P.C.  Electronically Signed by Alphonsa Overall M.D. on 07/01/2010 12:01:51 PM

## 2010-07-05 ENCOUNTER — Encounter (HOSPITAL_COMMUNITY)
Admission: RE | Admit: 2010-07-05 | Discharge: 2010-07-05 | Disposition: A | Discharge status: Home / Self Care | Payer: Managed Care, Other (non HMO) | Source: Ambulatory Visit | Attending: Obstetrics and Gynecology | Admitting: Obstetrics and Gynecology

## 2010-07-05 LAB — SURGICAL PCR SCREEN
MRSA, PCR: NEGATIVE
Staphylococcus aureus: NEGATIVE

## 2010-07-05 LAB — COMPREHENSIVE METABOLIC PANEL
BUN: 8 mg/dL (ref 6–23)
CO2: 28 mEq/L (ref 19–32)
Chloride: 103 mEq/L (ref 96–112)
Creatinine, Ser: 0.73 mg/dL (ref 0.4–1.2)
GFR calc non Af Amer: >60 mL/min (ref >60)
Glucose, Bld: 104 mg/dL — ABNORMAL HIGH (ref 70–99)
Total Bilirubin: 0.3 mg/dL (ref 0.3–1.2)

## 2010-07-05 LAB — CBC
Hemoglobin: 12.3 g/dL (ref 12.0–15.0)
MCH: 29.2 pg (ref 26.0–34.0)
MCHC: 33.2 g/dL (ref 30.0–36.0)
MCV: 88.1 fL (ref 78.0–100.0)
RBC: 4.21 MIL/uL (ref 3.87–5.11)

## 2010-07-05 NOTE — H&P (Signed)
[name], [name]                ACCOUNT NO.:  [id number]  MEDICAL RECORD NO.:  [id number]           PATIENT TYPE:  I  LOCATION:  0076                         FACILITY:  [facility]  PHYSICIAN:  Leighton Ruff. Redmond Pulling, MD     DATE OF BIRTH:  1965/07/17  DATE OF ADMISSION:  06/25/2010 DATE OF DISCHARGE:                             HISTORY & PHYSICAL   PRIMARY CARE PHYSICIAN:  Dr. Linna Darner.  OBSTETRIC/GYNECOLOGICAL:  Dr. Harrington Challenger.  CHIEF COMPLAINT:  Abdominal pain.  HISTORY OF PRESENT ILLNESS:  This patient is a 45 year old morbidly obese Caucasian female with recent diagnosis of T1b, N0 left breast cancer who underwent a left mastectomy with sentinel lymph node biopsy by Dr. Lucia Gaskins on June 21, 2010, who has unknown pelvic mass of unclear etiology.  He comes in today with worsening abdominal pain.  She has had intermittent abdominal pain on and off for at least a month.  She was found to have a large pelvic mass emanating from her left adnexa.  She has actually seen Dr. Harrington Challenger for evaluation.  PET scan to workup her for metastatic breast cancer did not show any increased metabolic activity in this mass.  It was felt that since she had breast cancer that the focus should be dealing with her breast cancer and the pelvic mass when be dealt with after she had management for her breast cancer.  She comes in today with worsening abdominal pain.  It started yesterday.  It is now 10/10.  Mainly in her upper abdomen.  She describes it as crampy pain.  She has had multiple episodes of nausea and emesis.  She had a bowel yesterday.  She is not sure if she has had any flatus today.  She denies any melena, hematochezia, or dysuria.  She denies any fevers or chills.  She denies any weight change.  PAST MEDICAL HISTORY: 1. Morbid obesity. 2. Left T1b, N0 breast cancer. 3. Left adnexal pelvic mass. 4. Gastroesophageal reflux disease. 5. ADHD. 6. Fatty liver disease. 7. Asthma. 8. Hypertension. 9.  Hypercholesteremia.  PAST SURGICAL HISTORY:  Left mastectomy with sentinel lymph node biopsy on June 21, 2010.  MEDICATIONS AT HOME:  Citalopram, verapamil, triamterene- hydrochlorothiazide, Advair, and albuterol.  ALLERGIES: 1. SULFA. 2. METOPROLOL/  SOCIAL HISTORY:  She is married.  She denies any alcohol or drugs.  She does smoke.  REVIEW OF SYSTEMS:  A comprehensive 12-point review of systems is performed.  All systems are negative except for as mentioned in the HPI. She has averaging about 25-30 mL a day out of her mastectomy drain.  She denies any problems with her mastectomy site.  FAMILY HISTORY:  Noncontributory.  PHYSICAL EXAMINATION:  GENERAL:  A morbidly obese Caucasian female who appears a little comfortable. VITAL SIGNS:  Temperature 98.2, pulse 84, blood pressure 118/79, respirations 16, and saturating 95% on room air. HEENT:  Atraumatic and normocephalic.  Pupils are equal and round.  No scleral icterus.  No external ear lesions.  Hearing is grossly normal. NECK:  Supple.  Trachea is midline. PULMONARY:  Lungs are clear.  Symmetric chest rise.  No use of  accessory muscles. CEREBROVASCULAR:  Regular rate and rhythm.  2+ radial pulse. CHEST:  She has a left mastectomy and chest wall incision.  There are no signs of hematoma or cellulitis.  The drains are intact x2.  There is serosanguineous fluid in the drain. ABDOMEN:  Morbidly obese.  She has a very large pannus.  She has got tenderness in her upper abdomen, mainly in the midline as well as around the umbilicus.  There is no rebound.  There is no peritonitis.  There are some bowel sounds. MUSCULOSKELETAL:  Free range of motion.  Moves all extremities well. Strength is symmetric. NEUROLOGIC:  Nonfocal.  Sensation is grossly intact. PSYCHIATRIC:  Alert and oriented x3.  Judgment and insight appear to be appropriate. SKIN:  No jaundice.  No rash.  LABORATORY DATA:  Sodium 138, potassium 4.3, chloride 100,  bicarb 29, BUN 14, creatinine 0.86, and blood sugar 131.  Calcium 9.7.  LFTs are essentially normal with a mild elevated ALT of 46 and albumin for 3.7. White count 4, hemoglobin 12.8, hematocrit 37.5, and platelet count 549. Lipase 20.  RADIOGRAPHS: 1. Acute down series from today shows no free air.  There is gas and     some dilated loops of small bowel.  There is gas and stool in the     colon. 2. CT of abdomen and pelvis from June 10, 2010, showed a 14.6 x 16.9     x 18.1 cm cystic mass in the central and upper pelvis which tracks     the left adnexa.  There is no hypermetabolic activity on PET.  I     also personally reviewed all of these images.  IMPRESSION:  This is a 45 year old morbidly obese Caucasian female with T1b, N0 breast cancer, gastroesophageal reflux disease, attention deficit hyperactivity disorder, fatty liver disease, asthma, hypertension, hypercholesteremia, left adnexal pelvic mass, and partial small bowel obstruction.  PLAN:  She is not tachycardiac.  She is afebrile and her abdominal exam does have some mild tenderness, therefore I am not going to perform a CAT scan today.  She has never had any previous abdominal surgery.  The only abnormality on previous recent imaging has been this large mass in her central and upper pelvis.  This is more than likely that this mass is potentially causing partial obstruction.  It is unlikely that she has adhesions.  Again, the fact that she has never had abdominal surgery, so we will admit her and make her n.p.o.  We will place her on bowel rest and IV fluids.  We will continue her home medication.  However, if she has any emesis, we will place an NG tube.  If for some reason her abdominal pain worsens overnight or she spikes a fever or if her white count goes up, we will perform a CAT scan.  We are going to repeat a set of plain films in the morning.  Dr. Lucia Gaskins is aware of her admission. We will also consult GYN to way  in.  She essentially has two issues, one being sort of her persistent ongoing abdominal pain and #2 just an acute process.  More than likely, she will need definitive management of this pelvic mass in order to resolve her ongoing persistent abdominal pain. It is possible that if she is resolved from the acute process that could be done on an elective basis.  However, if she fails to improve from a clinical perspective, it will need to be done during this hospitalization.  Leighton Ruff. Redmond Pulling, MD     EMW/MEDQ  D:  06/26/2010  T:  06/26/2010  Job:  [id number]  cc:   Dr. Harrington Challenger Dr. Linna Darner  Electronically Signed by Greer Pickerel M.D. on 07/05/2010 08:29:53 AM

## 2010-07-12 ENCOUNTER — Other Ambulatory Visit: Payer: Self-pay | Admitting: Obstetrics and Gynecology

## 2010-07-12 ENCOUNTER — Encounter: Payer: Self-pay | Admitting: Gastroenterology

## 2010-07-12 ENCOUNTER — Inpatient Hospital Stay (HOSPITAL_COMMUNITY)
Admission: RE | Admit: 2010-07-12 | Discharge: 2010-07-16 | DRG: 742 | Disposition: A | Discharge status: Home Health | Payer: Managed Care, Other (non HMO) | Source: Ambulatory Visit | Attending: Obstetrics and Gynecology | Admitting: Obstetrics and Gynecology

## 2010-07-12 DIAGNOSIS — K219 Gastro-esophageal reflux disease without esophagitis: Secondary | ICD-10-CM | POA: Diagnosis present

## 2010-07-12 DIAGNOSIS — D279 Benign neoplasm of unspecified ovary: Principal | ICD-10-CM | POA: Diagnosis present

## 2010-07-12 DIAGNOSIS — K560 Paralytic ileus: Secondary | ICD-10-CM | POA: Diagnosis not present

## 2010-07-12 DIAGNOSIS — E7800 Pure hypercholesterolemia, unspecified: Secondary | ICD-10-CM | POA: Diagnosis present

## 2010-07-12 DIAGNOSIS — K929 Disease of digestive system, unspecified: Secondary | ICD-10-CM | POA: Diagnosis not present

## 2010-07-12 DIAGNOSIS — I10 Essential (primary) hypertension: Secondary | ICD-10-CM | POA: Diagnosis present

## 2010-07-12 DIAGNOSIS — C50919 Malignant neoplasm of unspecified site of unspecified female breast: Secondary | ICD-10-CM | POA: Diagnosis present

## 2010-07-12 HISTORY — PX: BILATERAL SALPINGOOPHORECTOMY: SHX1223

## 2010-07-12 LAB — TYPE AND SCREEN: ABO/RH(D): A POS

## 2010-07-13 LAB — CBC
HCT: 33.9 % — ABNORMAL LOW (ref 36.0–46.0)
MCH: 29 pg (ref 26.0–34.0)
MCHC: 32.7 g/dL (ref 30.0–36.0)
MCV: 88.5 fL (ref 78.0–100.0)
RDW: 13.4 % (ref 11.5–15.5)
WBC: 16.4 10*3/uL — ABNORMAL HIGH (ref 4.0–10.5)

## 2010-07-13 LAB — COMPREHENSIVE METABOLIC PANEL
Alkaline Phosphatase: 37 U/L — ABNORMAL LOW (ref 39–117)
BUN: 7 mg/dL (ref 6–23)
Glucose, Bld: 140 mg/dL — ABNORMAL HIGH (ref 70–99)
Potassium: 4.3 mEq/L (ref 3.5–5.1)
Total Protein: 6.6 g/dL (ref 6.0–8.3)

## 2010-07-14 ENCOUNTER — Inpatient Hospital Stay (HOSPITAL_COMMUNITY): Payer: Managed Care, Other (non HMO)

## 2010-07-14 LAB — AMYLASE: Amylase: 28 U/L (ref 0–105)

## 2010-07-14 LAB — DIFFERENTIAL
Basophils Absolute: 0 10*3/uL (ref 0.0–0.1)
Eosinophils Relative: 0 % (ref 0–5)
Lymphocytes Relative: 23 % (ref 12–46)
Lymphs Abs: 2.7 10*3/uL (ref 0.7–4.0)
Monocytes Absolute: 0.9 10*3/uL (ref 0.1–1.0)
Monocytes Relative: 8 % (ref 3–12)
Neutro Abs: 7.9 10*3/uL — ABNORMAL HIGH (ref 1.7–7.7)

## 2010-07-14 LAB — HEMOGLOBIN A1C
Hgb A1c MFr Bld: 5.7 % — ABNORMAL HIGH (ref <5.7)
Mean Plasma Glucose: 117 mg/dL — ABNORMAL HIGH (ref <117)

## 2010-07-14 LAB — COMPREHENSIVE METABOLIC PANEL
Albumin: 3 g/dL — ABNORMAL LOW (ref 3.5–5.2)
BUN: 11 mg/dL (ref 6–23)
Calcium: 9 mg/dL (ref 8.4–10.5)
Creatinine, Ser: 0.58 mg/dL (ref 0.4–1.2)
Glucose, Bld: 103 mg/dL — ABNORMAL HIGH (ref 70–99)
Potassium: 3.6 mEq/L (ref 3.5–5.1)
Total Protein: 6.3 g/dL (ref 6.0–8.3)

## 2010-07-14 LAB — LACTIC ACID, PLASMA: Lactic Acid, Venous: 1.1 mmol/L (ref 0.5–2.2)

## 2010-07-14 LAB — CBC
HCT: 31.9 % — ABNORMAL LOW (ref 36.0–46.0)
HCT: 31.1 % — ABNORMAL LOW (ref 36.0–46.0)
Hemoglobin: 10.3 g/dL — ABNORMAL LOW (ref 12.0–15.0)
Hemoglobin: 10 g/dL — ABNORMAL LOW (ref 12.0–15.0)
MCH: 29 pg (ref 26.0–34.0)
MCHC: 32.2 g/dL (ref 30.0–36.0)
MCV: 90.1 fL (ref 78.0–100.0)
MCV: 90.1 fL (ref 78.0–100.0)
RBC: 3.54 MIL/uL — ABNORMAL LOW (ref 3.87–5.11)
RDW: 13.7 % (ref 11.5–15.5)
WBC: 14.4 10*3/uL — ABNORMAL HIGH (ref 4.0–10.5)

## 2010-07-14 LAB — LACTATE DEHYDROGENASE: LDH: 168 U/L (ref 94–250)

## 2010-07-14 MED ORDER — IOHEXOL 300 MG/ML  SOLN
100.0000 mL | Freq: Once | INTRAMUSCULAR | Status: AC | PRN
  Administered 2010-07-14: 100 mL via INTRAVENOUS

## 2010-07-29 NOTE — Procedures (Signed)
[name], [name] NO.:  [id number]   MEDICAL RECORD NO.:  [id number]          PATIENT TYPE:  OUT   LOCATION:  SLEEP CENTER                 FACILITY:  [facility]   PHYSICIAN:  Danton Sewer, M.D. [facility] DATE OF BIRTH:  Feb 20, 1966   DATE OF STUDY:                              NOCTURNAL POLYSOMNOGRAM   REFERRING PHYSICIAN:  Dr. Ignacia Palma   INDICATION FOR THE STUDY:  Hypersomnia with sleep apnea.   EPWORTH SCORE:  13   SLEEP ARCHITECTURE:  The patient had a total sleep time of 299 minutes with  adequate slow wave sleep but decreased REM. Sleep onset latency was  prolonged at 40 minutes, and REM onset was prolonged at 123 minutes. Sleep  efficiency was 82%.   RESPIRATORY DATA:  The patient was found to have 41 hypopneas and five  apneas for a respiratory disturbance index of nine events per hour. There  was moderate snoring noted. Events were not positional. There was also large  numbers of nonspecific arousals, which when combined with the patient's  snoring may suggest the upper airway resistant syndrome.   OXYGEN DATA:  There was O2 desaturation as low as 88% during the patient's  obstructive events.   CARDIAC DATA:  No clinically significant cardiac arrhythmias.   MOVEMENT/PARASOMNIAS:  The patient was found to have increased chin EMG that  is suggestive of bruxism. She was also found to have 210 leg jerks with  eight per hour resulting in arousal or awakening. This is clinically  significant.   IMPRESSION/RECOMMENDATION:  1.  Mild obstructive sleep apnea/hypopnea syndrome with a respiratory      disturbance index of nine events per hour and oxygen desaturation 88%.      However, the patient did have moderate snoring and increased numbers of      nonspecific arousals which suggest upper airway resistance as well. It      is really unclear how much of the patient's symptoms are due to her      sleep disordered breathing, and how much may be due to her leg  jerks.      Clinical correlation is suggested regarding treatment options. These may      include weight loss alone, upper airway surgery, oral appliance, or      CPAP.  2.  Increased chin EMG suggestive of bruxism  3.  Large numbers of leg jerks with very significant sleep disruption.      Clinical correlation is suggested, since I suspect this is a major      contributor to the patient's sleep disruption.           ______________________________  Danton Sewer, M.D. [facility]  Diplomate, American Board of Sleep  Medicine     KC/MEDQ  D:  01/20/2005 11:25:28  T:  01/20/2005 16:05:10  Job:  [id number]

## 2010-08-03 ENCOUNTER — Encounter (HOSPITAL_BASED_OUTPATIENT_CLINIC_OR_DEPARTMENT_OTHER): Payer: Managed Care, Other (non HMO) | Admitting: Oncology

## 2010-08-03 DIAGNOSIS — Z9079 Acquired absence of other genital organ(s): Secondary | ICD-10-CM

## 2010-08-03 DIAGNOSIS — C50419 Malignant neoplasm of upper-outer quadrant of unspecified female breast: Secondary | ICD-10-CM

## 2010-08-03 DIAGNOSIS — Z9010 Acquired absence of unspecified breast and nipple: Secondary | ICD-10-CM

## 2010-08-03 DIAGNOSIS — Z170 Estrogen receptor positive status [ER+]: Secondary | ICD-10-CM

## 2010-08-11 ENCOUNTER — Other Ambulatory Visit: Payer: Self-pay | Admitting: Oncology

## 2010-08-11 ENCOUNTER — Encounter (HOSPITAL_BASED_OUTPATIENT_CLINIC_OR_DEPARTMENT_OTHER): Payer: Managed Care, Other (non HMO) | Admitting: Oncology

## 2010-08-11 DIAGNOSIS — C50419 Malignant neoplasm of upper-outer quadrant of unspecified female breast: Secondary | ICD-10-CM

## 2010-08-11 LAB — CBC WITH DIFFERENTIAL/PLATELET
BASO%: 0.5 % (ref 0.0–2.0)
Basophils Absolute: 0 10*3/uL (ref 0.0–0.1)
EOS%: 5.7 % (ref 0.0–7.0)
MCH: 30.2 pg (ref 25.1–34.0)
MCHC: 34.7 g/dL (ref 31.5–36.0)
MCV: 87.1 fL (ref 79.5–101.0)
MONO%: 7 % (ref 0.0–14.0)
RDW: 14 % (ref 11.2–14.5)
lymph#: 2.4 10*3/uL (ref 0.9–3.3)

## 2010-08-11 LAB — BASIC METABOLIC PANEL
BUN: 11 mg/dL (ref 6–23)
Chloride: 104 mEq/L (ref 96–112)
Creatinine, Ser: 0.75 mg/dL (ref 0.40–1.20)
Potassium: 4 mEq/L (ref 3.5–5.3)

## 2010-08-11 NOTE — Op Note (Signed)
[name], [name]                ACCOUNT NO.:  [id number]  MEDICAL RECORD NO.:  [id number]           PATIENT TYPE:  I  LOCATION:  9310                          FACILITY:  [facility]  PHYSICIAN:  Nechemia Chiappetta H. Harrington Challenger, MD     DATE OF BIRTH:  Jul 12, 1965  DATE OF PROCEDURE:  07/12/2010 DATE OF DISCHARGE:                              OPERATIVE REPORT   ATTENDING PHYSICIAN:  Clodagh Odenthal H. Harrington Challenger, MD  PREOPERATIVE DIAGNOSES: 1. Pelvic mass. 2. Left breast cancer. 3. Recent history of small bowel obstruction.  POSTOPERATIVE DIAGNOSES: 1. Left adnexal mass with evidence of ovarian torsion. 2. Breast cancer. 3. Adhesive disease.  PROCEDURE:  [name] is a 45 year old G2, P2 who recently underwent a left simple mastectomy for breast cancer.  She prior to her diagnosis of breast cancer had been having some intermittent abdominal pain.  She underwent a CT of the abdomen and pelvis on June 10, 2010 for evaluation of abdominal pain and at that time, it was noted to have a 14 x 16 x 18-cm pelvic mass, which appeared to be coming off of the left adnexa.  She had also undergone a PET scan for her staging for her breast cancer on June 03, 2010, which had also noted this pelvic mass during that PET scan.  There was no increased metabolic flow in this mass.  No ascites was noted or lymphadenopathy on pelvic CT.  She did develop a small bowel obstruction on June 25, 2010, after her mastectomy surgery which was thought to be due to this pelvic mass, this resolved with conservative management.  The patient's case was reviewed with Dr. Nancy Marus, from GYN Oncology and given the appearance of the mass on imaging, no ascites, no pelvic lymphadenopathy, and the fact that there was no increased metabolic flow noted on PET scan, it was felt that this mass was benign in nature and that we could proceed without the assistance of GYN Oncology.  Initially, it was planned to attempt a laparoscopic BSO; however, after  the patient was placed on the operating room table and the abdomen was inspected, it was felt that a laparoscopic approach was not feasible.  Therefore, the decision was made to proceed with an exploratory laparotomy.  Prior to coming to the OR, the discussion had been had with the patient and her husband regarding preservation of the patient's normal right ovary and the case was also reviewed with the patient's oncologist and given that she had a hormone-receptive breast cancer.  It was felt that bilateral salpingo- oophorectomy was the preferred choice.  The patient and her husband do understand that this will result in surgical menopause and that because of the hormone receptor sensitive breast cancer, the patient is not a candidate for hormone replacement therapy.  The patient was brought to the operating room.  She was placed in dorsal supine position.  The abdomen and perineum were prepped and draped in the normal sterile fashion.  Scalpel was then used to make a vertical skin incision superior to the umbilicus.  This was carried down through the underlying layers of soft tissue to  the fascia.  The peritoneum was then identified, tented up and entered sharply.  The incision was extended superiorly and inferiorly.  Omentum was then encountered.  Upon exploration of the abdomen, it was noted that bowel and omentum were adhesed to the surface of the ovary.  Due to these adhesions, the skin incision was extended periumbilically, pelvic washings were obtained. The Bookwalter retractor was erected and moist laparotomy sponges were used to pack the bowels into the upper abdomen.  This improved visualization of the pelvic structures, however, due to the patient's habitus.  Visualization was still somewhat limited.  The pelvic mass was noted extending from the left ovary and demonstrated to represent an ovarian torsion.  The surface of the ovary itself was smooth without excrescences.  The  Kelly clamps were then placed x2 across the twisted infundibulopelvic.  This was clamped, cut and suture ligated and the left ovary and tube were removed and mass.  Attention was then turned to the right ovary and tube, which were noted to be normal.  The infundibulopelvic was identified.  It was crossclamped, cut and suture ligated along the peritoneum underneath the mesosalpinx.  Serial bites were taken and suture ligated and the right ovary and tube were removed. The infundibulopelvic was noted to be hemostatic.  The peritoneum was slightly oozing and Surgicel was placed over the peritoneum to assist with hemostasis.  The small and large bowel were then inspected after the laparotomy sponges were removed and the bowel was removed from the upper abdomen as well as the omentum.  No areas of bleeding were noted. The Bookwalter retractor was disassembled.  The peritoneum and fascia were closed with 0 looped PDS in a running fashion.  The subcutaneous tissue was reapproximated with 0 Vicryl running suture, the skin edges were reapproximated with interrupted dermal suture as well as subcuticular closure using 3-0 Vicryl and the skin closure was completed with Dermabond.  All sponge, lap, and needle counts were correct x2. SCDs were placed on the patient in the operating room and will remain in place.  The patient tolerated the procedure well and was brought to the recovery room in stable condition following the procedure.     Farrel Gobble. Harrington Challenger, MD     KHR/MEDQ  D:  07/12/2010  T:  07/13/2010  Job:  [id number]  cc:   Eston Esters, M.D., F.R.C.P.C. Fax: [place]. Lucia Gaskins, M.D. [address] [place] [address]  Electronically Signed by Vanessa Kick MD on 08/11/2010 09:04:58 AM

## 2010-08-11 NOTE — Discharge Summary (Signed)
  [name], [name]                ACCOUNT NO.:  [id number]  MEDICAL RECORD NO.:  [id number]           PATIENT TYPE:  I  LOCATION:  9310                          FACILITY:  [facility]  PHYSICIAN:  Emmie Frakes H. Harrington Challenger, MD     DATE OF BIRTH:  1965/03/14  DATE OF ADMISSION:  07/12/2010 DATE OF DISCHARGE:  07/16/2010                              DISCHARGE SUMMARY   HOSPITAL COURSE:  Ms. Knighton is a 45 year old G2, P2 with a new diagnosis of breast cancer and pelvic mass who on May 1 underwent an exploratory laparotomy, bilateral salpingo-oophorectomy and lysis of adhesions for a left adnexal cystic mass and ovarian torsion.  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  Postoperatively, the patient did well.  However she did have very slow return of bowel function.  On an abdominal x-ray on postop day #2 after a night of nausea and vomiting, there was some concern for either pneumatosis or pseudo pneumatosis.  Therefore, a CT of the abdomen and pelvis was performed for evaluation which demonstrated no evidence of pneumatosis and only colonic ileus.  She continued to have slow return of bowel function. However by postoperative day #4, she was passing flatus and having bowel movements, had no further nausea, vomiting overnight and had excellent pain control and the decision was made to discharge her home.  She is given prescriptions for Dilaudid for pain and ibuprofen for pain.  She was instructed in the signs or symptoms to be aware of or need to return to the hospital and will follow up in the office with me on Jul 20, 2010.     Farrel Gobble. Harrington Challenger, MD     KHR/MEDQ  D:  07/16/2010  T:  07/16/2010  Job:  [id number]  cc:   Eston Esters, M.D., F.R.C.P.C. Fax: [place]. Lucia Gaskins, M.D. [address], [place] [address]  Electronically Signed by Vanessa Kick MD on 08/11/2010 09:04:53 AM

## 2010-08-19 ENCOUNTER — Other Ambulatory Visit: Payer: Self-pay | Admitting: Oncology

## 2010-08-19 ENCOUNTER — Encounter (HOSPITAL_BASED_OUTPATIENT_CLINIC_OR_DEPARTMENT_OTHER): Payer: Managed Care, Other (non HMO) | Admitting: Oncology

## 2010-08-19 DIAGNOSIS — C50419 Malignant neoplasm of upper-outer quadrant of unspecified female breast: Secondary | ICD-10-CM

## 2010-08-19 DIAGNOSIS — C50219 Malignant neoplasm of upper-inner quadrant of unspecified female breast: Secondary | ICD-10-CM

## 2010-08-19 DIAGNOSIS — Z170 Estrogen receptor positive status [ER+]: Secondary | ICD-10-CM

## 2010-08-19 LAB — CBC WITH DIFFERENTIAL/PLATELET
Basophils Absolute: 0 10*3/uL (ref 0.0–0.1)
Eosinophils Absolute: 0.3 10*3/uL (ref 0.0–0.5)
HGB: 12.1 g/dL (ref 11.6–15.9)
MCV: 86.8 fL (ref 79.5–101.0)
MONO#: 0.6 10*3/uL (ref 0.1–0.9)
NEUT#: 3.4 10*3/uL (ref 1.5–6.5)
Platelets: 298 10*3/uL (ref 145–400)
RBC: 4.13 10*6/uL (ref 3.70–5.45)
RDW: 14.1 % (ref 11.2–14.5)
WBC: 6.6 10*3/uL (ref 3.9–10.3)

## 2010-08-19 LAB — COMPREHENSIVE METABOLIC PANEL
Albumin: 4.1 g/dL (ref 3.5–5.2)
BUN: 16 mg/dL (ref 6–23)
CO2: 26 mEq/L (ref 19–32)
Calcium: 9.2 mg/dL (ref 8.4–10.5)
Glucose, Bld: 174 mg/dL — ABNORMAL HIGH (ref 70–99)
Potassium: 3.9 mEq/L (ref 3.5–5.3)
Sodium: 138 mEq/L (ref 135–145)
Total Protein: 6.9 g/dL (ref 6.0–8.3)

## 2010-09-01 ENCOUNTER — Other Ambulatory Visit: Payer: Self-pay | Admitting: Oncology

## 2010-09-01 ENCOUNTER — Encounter (HOSPITAL_BASED_OUTPATIENT_CLINIC_OR_DEPARTMENT_OTHER): Payer: Managed Care, Other (non HMO) | Admitting: Oncology

## 2010-09-01 DIAGNOSIS — Z9010 Acquired absence of unspecified breast and nipple: Secondary | ICD-10-CM

## 2010-09-01 DIAGNOSIS — Z170 Estrogen receptor positive status [ER+]: Secondary | ICD-10-CM

## 2010-09-01 DIAGNOSIS — Z9079 Acquired absence of other genital organ(s): Secondary | ICD-10-CM

## 2010-09-01 DIAGNOSIS — C50419 Malignant neoplasm of upper-outer quadrant of unspecified female breast: Secondary | ICD-10-CM

## 2010-09-01 DIAGNOSIS — Z5111 Encounter for antineoplastic chemotherapy: Secondary | ICD-10-CM

## 2010-09-01 LAB — CBC WITH DIFFERENTIAL/PLATELET
Eosinophils Absolute: 0 10*3/uL (ref 0.0–0.5)
HGB: 12.4 g/dL (ref 11.6–15.9)
MCV: 85 fL (ref 79.5–101.0)
MONO#: 0.2 10*3/uL (ref 0.1–0.9)
MONO%: 2.4 % (ref 0.0–14.0)
NEUT#: 8 10*3/uL — ABNORMAL HIGH (ref 1.5–6.5)
RBC: 4.32 10*6/uL (ref 3.70–5.45)
RDW: 14 % (ref 11.2–14.5)
WBC: 9.8 10*3/uL (ref 3.9–10.3)
lymph#: 1.6 10*3/uL (ref 0.9–3.3)
nRBC: 0 % (ref 0–0)

## 2010-09-02 ENCOUNTER — Encounter (HOSPITAL_BASED_OUTPATIENT_CLINIC_OR_DEPARTMENT_OTHER): Payer: Managed Care, Other (non HMO) | Admitting: Oncology

## 2010-09-02 DIAGNOSIS — Z9079 Acquired absence of other genital organ(s): Secondary | ICD-10-CM

## 2010-09-02 DIAGNOSIS — C50419 Malignant neoplasm of upper-outer quadrant of unspecified female breast: Secondary | ICD-10-CM

## 2010-09-02 DIAGNOSIS — Z9010 Acquired absence of unspecified breast and nipple: Secondary | ICD-10-CM

## 2010-09-02 DIAGNOSIS — Z5111 Encounter for antineoplastic chemotherapy: Secondary | ICD-10-CM

## 2010-09-05 ENCOUNTER — Other Ambulatory Visit: Payer: Self-pay | Admitting: Family Medicine

## 2010-09-05 NOTE — Telephone Encounter (Signed)
Refilled

## 2010-09-07 ENCOUNTER — Other Ambulatory Visit: Payer: Self-pay | Admitting: Oncology

## 2010-09-07 ENCOUNTER — Encounter (HOSPITAL_BASED_OUTPATIENT_CLINIC_OR_DEPARTMENT_OTHER): Payer: Managed Care, Other (non HMO) | Admitting: Oncology

## 2010-09-07 DIAGNOSIS — C50419 Malignant neoplasm of upper-outer quadrant of unspecified female breast: Secondary | ICD-10-CM

## 2010-09-07 DIAGNOSIS — Z170 Estrogen receptor positive status [ER+]: Secondary | ICD-10-CM

## 2010-09-07 DIAGNOSIS — Z9010 Acquired absence of unspecified breast and nipple: Secondary | ICD-10-CM

## 2010-09-07 DIAGNOSIS — Z9079 Acquired absence of other genital organ(s): Secondary | ICD-10-CM

## 2010-09-07 LAB — COMPREHENSIVE METABOLIC PANEL
ALT: 289 U/L — ABNORMAL HIGH (ref 0–35)
CO2: 24 mEq/L (ref 19–32)
Calcium: 9.4 mg/dL (ref 8.4–10.5)
Chloride: 100 mEq/L (ref 96–112)
Creatinine, Ser: 0.79 mg/dL (ref 0.50–1.10)
Sodium: 136 mEq/L (ref 135–145)
Total Protein: 6.6 g/dL (ref 6.0–8.3)

## 2010-09-07 LAB — CBC WITH DIFFERENTIAL/PLATELET
BASO%: 0.4 % (ref 0.0–2.0)
Eosinophils Absolute: 0.2 10*3/uL (ref 0.0–0.5)
HCT: 37.9 % (ref 34.8–46.6)
MCHC: 34.3 g/dL (ref 31.5–36.0)
MONO#: 0.5 10*3/uL (ref 0.1–0.9)
NEUT#: 3.5 10*3/uL (ref 1.5–6.5)
NEUT%: 55.2 % (ref 38.4–76.8)
WBC: 6.4 10*3/uL (ref 3.9–10.3)
lymph#: 2.2 10*3/uL (ref 0.9–3.3)

## 2010-09-09 ENCOUNTER — Emergency Department (HOSPITAL_COMMUNITY)
Admission: EM | Admit: 2010-09-09 | Discharge: 2010-09-09 | Disposition: A | Discharge status: Home / Self Care | Payer: Managed Care, Other (non HMO) | Attending: Emergency Medicine | Admitting: Emergency Medicine

## 2010-09-09 ENCOUNTER — Emergency Department (HOSPITAL_COMMUNITY): Payer: Managed Care, Other (non HMO)

## 2010-09-09 DIAGNOSIS — K219 Gastro-esophageal reflux disease without esophagitis: Secondary | ICD-10-CM | POA: Insufficient documentation

## 2010-09-09 DIAGNOSIS — F988 Other specified behavioral and emotional disorders with onset usually occurring in childhood and adolescence: Secondary | ICD-10-CM | POA: Insufficient documentation

## 2010-09-09 DIAGNOSIS — S139XXA Sprain of joints and ligaments of unspecified parts of neck, initial encounter: Secondary | ICD-10-CM | POA: Insufficient documentation

## 2010-09-09 DIAGNOSIS — M25519 Pain in unspecified shoulder: Secondary | ICD-10-CM | POA: Insufficient documentation

## 2010-09-09 DIAGNOSIS — R6884 Jaw pain: Secondary | ICD-10-CM | POA: Insufficient documentation

## 2010-09-09 DIAGNOSIS — I10 Essential (primary) hypertension: Secondary | ICD-10-CM | POA: Insufficient documentation

## 2010-09-09 DIAGNOSIS — J45909 Unspecified asthma, uncomplicated: Secondary | ICD-10-CM | POA: Insufficient documentation

## 2010-09-09 DIAGNOSIS — M542 Cervicalgia: Secondary | ICD-10-CM | POA: Insufficient documentation

## 2010-09-09 DIAGNOSIS — E7800 Pure hypercholesterolemia, unspecified: Secondary | ICD-10-CM | POA: Insufficient documentation

## 2010-09-09 DIAGNOSIS — S40019A Contusion of unspecified shoulder, initial encounter: Secondary | ICD-10-CM | POA: Insufficient documentation

## 2010-09-21 ENCOUNTER — Encounter (HOSPITAL_BASED_OUTPATIENT_CLINIC_OR_DEPARTMENT_OTHER): Payer: No Typology Code available for payment source | Admitting: Oncology

## 2010-09-21 ENCOUNTER — Other Ambulatory Visit: Payer: Self-pay | Admitting: Oncology

## 2010-09-21 DIAGNOSIS — Z9010 Acquired absence of unspecified breast and nipple: Secondary | ICD-10-CM

## 2010-09-21 DIAGNOSIS — C50419 Malignant neoplasm of upper-outer quadrant of unspecified female breast: Secondary | ICD-10-CM

## 2010-09-21 DIAGNOSIS — Z170 Estrogen receptor positive status [ER+]: Secondary | ICD-10-CM

## 2010-09-21 DIAGNOSIS — Z9079 Acquired absence of other genital organ(s): Secondary | ICD-10-CM

## 2010-09-21 LAB — CBC WITH DIFFERENTIAL/PLATELET
BASO%: 0.7 % (ref 0.0–2.0)
MCHC: 34.6 g/dL (ref 31.5–36.0)
MONO#: 0.8 10*3/uL (ref 0.1–0.9)
RBC: 4.06 10*6/uL (ref 3.70–5.45)
WBC: 7.4 10*3/uL (ref 3.9–10.3)
lymph#: 2.3 10*3/uL (ref 0.9–3.3)
nRBC: 0 % (ref 0–0)

## 2010-09-22 ENCOUNTER — Encounter (HOSPITAL_BASED_OUTPATIENT_CLINIC_OR_DEPARTMENT_OTHER): Payer: Managed Care, Other (non HMO) | Admitting: Oncology

## 2010-09-22 DIAGNOSIS — Z170 Estrogen receptor positive status [ER+]: Secondary | ICD-10-CM

## 2010-09-22 DIAGNOSIS — Z5111 Encounter for antineoplastic chemotherapy: Secondary | ICD-10-CM

## 2010-09-22 DIAGNOSIS — C50419 Malignant neoplasm of upper-outer quadrant of unspecified female breast: Secondary | ICD-10-CM

## 2010-09-23 ENCOUNTER — Encounter: Payer: Managed Care, Other (non HMO) | Admitting: Oncology

## 2010-09-29 ENCOUNTER — Other Ambulatory Visit: Payer: Self-pay | Admitting: Physician Assistant

## 2010-09-29 ENCOUNTER — Encounter (HOSPITAL_BASED_OUTPATIENT_CLINIC_OR_DEPARTMENT_OTHER): Payer: Managed Care, Other (non HMO) | Admitting: Oncology

## 2010-09-29 DIAGNOSIS — Z9010 Acquired absence of unspecified breast and nipple: Secondary | ICD-10-CM

## 2010-09-29 DIAGNOSIS — C50219 Malignant neoplasm of upper-inner quadrant of unspecified female breast: Secondary | ICD-10-CM

## 2010-09-29 DIAGNOSIS — C50419 Malignant neoplasm of upper-outer quadrant of unspecified female breast: Secondary | ICD-10-CM

## 2010-09-29 DIAGNOSIS — Z9079 Acquired absence of other genital organ(s): Secondary | ICD-10-CM

## 2010-09-29 DIAGNOSIS — Z170 Estrogen receptor positive status [ER+]: Secondary | ICD-10-CM

## 2010-09-29 LAB — CBC WITH DIFFERENTIAL/PLATELET
Basophils Absolute: 0 10*3/uL (ref 0.0–0.1)
EOS%: 1.3 % (ref 0.0–7.0)
Eosinophils Absolute: 0.2 10*3/uL (ref 0.0–0.5)
HGB: 12.9 g/dL (ref 11.6–15.9)
NEUT#: 13.3 10*3/uL — ABNORMAL HIGH (ref 1.5–6.5)
RDW: 16 % — ABNORMAL HIGH (ref 11.2–14.5)
lymph#: 3.1 10*3/uL (ref 0.9–3.3)

## 2010-10-13 ENCOUNTER — Other Ambulatory Visit: Payer: Self-pay | Admitting: Physician Assistant

## 2010-10-13 ENCOUNTER — Other Ambulatory Visit: Payer: Self-pay | Admitting: Oncology

## 2010-10-13 ENCOUNTER — Encounter (HOSPITAL_BASED_OUTPATIENT_CLINIC_OR_DEPARTMENT_OTHER): Payer: Managed Care, Other (non HMO) | Admitting: Oncology

## 2010-10-13 DIAGNOSIS — C50419 Malignant neoplasm of upper-outer quadrant of unspecified female breast: Secondary | ICD-10-CM

## 2010-10-13 DIAGNOSIS — Z9079 Acquired absence of other genital organ(s): Secondary | ICD-10-CM

## 2010-10-13 DIAGNOSIS — Z170 Estrogen receptor positive status [ER+]: Secondary | ICD-10-CM

## 2010-10-13 DIAGNOSIS — Z5111 Encounter for antineoplastic chemotherapy: Secondary | ICD-10-CM

## 2010-10-13 DIAGNOSIS — C50219 Malignant neoplasm of upper-inner quadrant of unspecified female breast: Secondary | ICD-10-CM

## 2010-10-13 DIAGNOSIS — Z9010 Acquired absence of unspecified breast and nipple: Secondary | ICD-10-CM

## 2010-10-13 DIAGNOSIS — G589 Mononeuropathy, unspecified: Secondary | ICD-10-CM

## 2010-10-13 LAB — CBC WITH DIFFERENTIAL/PLATELET
Basophils Absolute: 0 10*3/uL (ref 0.0–0.1)
Eosinophils Absolute: 0 10*3/uL (ref 0.0–0.5)
HGB: 12.1 g/dL (ref 11.6–15.9)
MCV: 88.5 fL (ref 79.5–101.0)
MONO%: 0.7 % (ref 0.0–14.0)
NEUT#: 11.9 10*3/uL — ABNORMAL HIGH (ref 1.5–6.5)
RDW: 17.3 % — ABNORMAL HIGH (ref 11.2–14.5)

## 2010-10-13 LAB — COMPREHENSIVE METABOLIC PANEL
Albumin: 4.2 g/dL (ref 3.5–5.2)
Alkaline Phosphatase: 38 U/L — ABNORMAL LOW (ref 39–117)
BUN: 15 mg/dL (ref 6–23)
CO2: 18 mEq/L — ABNORMAL LOW (ref 19–32)
Calcium: 10 mg/dL (ref 8.4–10.5)
Chloride: 100 mEq/L (ref 96–112)
Glucose, Bld: 347 mg/dL — ABNORMAL HIGH (ref 70–99)
Potassium: 4.6 mEq/L (ref 3.5–5.3)

## 2010-10-14 ENCOUNTER — Other Ambulatory Visit: Payer: Self-pay | Admitting: Family Medicine

## 2010-10-14 ENCOUNTER — Encounter (HOSPITAL_BASED_OUTPATIENT_CLINIC_OR_DEPARTMENT_OTHER): Payer: Managed Care, Other (non HMO) | Admitting: Oncology

## 2010-10-14 DIAGNOSIS — C50219 Malignant neoplasm of upper-inner quadrant of unspecified female breast: Secondary | ICD-10-CM

## 2010-10-14 DIAGNOSIS — Z5189 Encounter for other specified aftercare: Secondary | ICD-10-CM

## 2010-10-20 ENCOUNTER — Encounter (HOSPITAL_BASED_OUTPATIENT_CLINIC_OR_DEPARTMENT_OTHER): Payer: Managed Care, Other (non HMO) | Admitting: Oncology

## 2010-10-20 ENCOUNTER — Other Ambulatory Visit: Payer: Self-pay | Admitting: Physician Assistant

## 2010-10-20 DIAGNOSIS — Z9010 Acquired absence of unspecified breast and nipple: Secondary | ICD-10-CM

## 2010-10-20 DIAGNOSIS — Z9079 Acquired absence of other genital organ(s): Secondary | ICD-10-CM

## 2010-10-20 DIAGNOSIS — C50419 Malignant neoplasm of upper-outer quadrant of unspecified female breast: Secondary | ICD-10-CM

## 2010-10-20 DIAGNOSIS — Z170 Estrogen receptor positive status [ER+]: Secondary | ICD-10-CM

## 2010-10-20 LAB — CBC WITH DIFFERENTIAL/PLATELET
Basophils Absolute: 0.1 10*3/uL (ref 0.0–0.1)
Eosinophils Absolute: 0.2 10*3/uL (ref 0.0–0.5)
HGB: 11.1 g/dL — ABNORMAL LOW (ref 11.6–15.9)
MONO#: 1.1 10*3/uL — ABNORMAL HIGH (ref 0.1–0.9)
NEUT#: 16.2 10*3/uL — ABNORMAL HIGH (ref 1.5–6.5)
RDW: 17.2 % — ABNORMAL HIGH (ref 11.2–14.5)
WBC: 20.1 10*3/uL — ABNORMAL HIGH (ref 3.9–10.3)
lymph#: 2.5 10*3/uL (ref 0.9–3.3)

## 2010-10-31 ENCOUNTER — Other Ambulatory Visit: Payer: Self-pay | Admitting: Internal Medicine

## 2010-11-01 MED ORDER — CITALOPRAM HYDROBROMIDE 20 MG PO TABS: 20.0000 mg | ORAL_TABLET | Freq: Every day | ORAL | Status: DC

## 2010-11-01 NOTE — Telephone Encounter (Signed)
[facility]'s patient please advise    KP

## 2010-11-02 ENCOUNTER — Other Ambulatory Visit: Payer: Self-pay | Admitting: Oncology

## 2010-11-02 ENCOUNTER — Encounter (HOSPITAL_BASED_OUTPATIENT_CLINIC_OR_DEPARTMENT_OTHER): Payer: Managed Care, Other (non HMO) | Admitting: Oncology

## 2010-11-02 DIAGNOSIS — C50419 Malignant neoplasm of upper-outer quadrant of unspecified female breast: Secondary | ICD-10-CM

## 2010-11-02 DIAGNOSIS — Z9079 Acquired absence of other genital organ(s): Secondary | ICD-10-CM

## 2010-11-02 DIAGNOSIS — C50219 Malignant neoplasm of upper-inner quadrant of unspecified female breast: Secondary | ICD-10-CM

## 2010-11-02 DIAGNOSIS — Z170 Estrogen receptor positive status [ER+]: Secondary | ICD-10-CM

## 2010-11-02 DIAGNOSIS — Z9010 Acquired absence of unspecified breast and nipple: Secondary | ICD-10-CM

## 2010-11-02 LAB — CBC WITH DIFFERENTIAL/PLATELET
Eosinophils Absolute: 0 10*3/uL (ref 0.0–0.5)
MONO#: 0.1 10*3/uL (ref 0.1–0.9)
NEUT#: 10.6 10*3/uL — ABNORMAL HIGH (ref 1.5–6.5)
RBC: 4.2 10*6/uL (ref 3.70–5.45)
RDW: 18.7 % — ABNORMAL HIGH (ref 11.2–14.5)
WBC: 11.5 10*3/uL — ABNORMAL HIGH (ref 3.9–10.3)
lymph#: 0.7 10*3/uL — ABNORMAL LOW (ref 0.9–3.3)

## 2010-11-02 LAB — COMPREHENSIVE METABOLIC PANEL
Albumin: 4.5 g/dL (ref 3.5–5.2)
Alkaline Phosphatase: 38 U/L — ABNORMAL LOW (ref 39–117)
CO2: 22 mEq/L (ref 19–32)
Glucose, Bld: 153 mg/dL — ABNORMAL HIGH (ref 70–99)
Potassium: 4.3 mEq/L (ref 3.5–5.3)
Sodium: 138 mEq/L (ref 135–145)
Total Protein: 7 g/dL (ref 6.0–8.3)

## 2010-11-03 ENCOUNTER — Encounter (HOSPITAL_BASED_OUTPATIENT_CLINIC_OR_DEPARTMENT_OTHER): Payer: Managed Care, Other (non HMO) | Admitting: Oncology

## 2010-11-03 DIAGNOSIS — Z5111 Encounter for antineoplastic chemotherapy: Secondary | ICD-10-CM

## 2010-11-03 DIAGNOSIS — C50219 Malignant neoplasm of upper-inner quadrant of unspecified female breast: Secondary | ICD-10-CM

## 2010-11-04 ENCOUNTER — Encounter (HOSPITAL_BASED_OUTPATIENT_CLINIC_OR_DEPARTMENT_OTHER): Payer: Managed Care, Other (non HMO) | Admitting: Oncology

## 2010-11-04 DIAGNOSIS — C50219 Malignant neoplasm of upper-inner quadrant of unspecified female breast: Secondary | ICD-10-CM

## 2010-11-04 DIAGNOSIS — Z5189 Encounter for other specified aftercare: Secondary | ICD-10-CM

## 2010-11-10 ENCOUNTER — Other Ambulatory Visit: Payer: Self-pay | Admitting: Physician Assistant

## 2010-11-10 ENCOUNTER — Encounter (HOSPITAL_BASED_OUTPATIENT_CLINIC_OR_DEPARTMENT_OTHER): Payer: Managed Care, Other (non HMO) | Admitting: Oncology

## 2010-11-10 DIAGNOSIS — Z9079 Acquired absence of other genital organ(s): Secondary | ICD-10-CM

## 2010-11-10 DIAGNOSIS — Z9010 Acquired absence of unspecified breast and nipple: Secondary | ICD-10-CM

## 2010-11-10 DIAGNOSIS — Z170 Estrogen receptor positive status [ER+]: Secondary | ICD-10-CM

## 2010-11-10 DIAGNOSIS — C50419 Malignant neoplasm of upper-outer quadrant of unspecified female breast: Secondary | ICD-10-CM

## 2010-11-10 DIAGNOSIS — C50219 Malignant neoplasm of upper-inner quadrant of unspecified female breast: Secondary | ICD-10-CM

## 2010-11-10 LAB — CBC WITH DIFFERENTIAL/PLATELET
BASO%: 0.4 % (ref 0.0–2.0)
Eosinophils Absolute: 0.2 10*3/uL (ref 0.0–0.5)
MCV: 91.4 fL (ref 79.5–101.0)
MONO%: 14.2 % — ABNORMAL HIGH (ref 0.0–14.0)
NEUT#: 5.7 10*3/uL (ref 1.5–6.5)
RBC: 3.9 10*6/uL (ref 3.70–5.45)
RDW: 18.2 % — ABNORMAL HIGH (ref 11.2–14.5)
WBC: 8.7 10*3/uL (ref 3.9–10.3)

## 2010-11-21 ENCOUNTER — Other Ambulatory Visit: Payer: Self-pay | Admitting: Internal Medicine

## 2010-11-21 MED ORDER — FLUTICASONE-SALMETEROL 250-50 MCG/DOSE IN AEPB: 1.0000 | INHALATION_SPRAY | Freq: Two times a day (BID) | RESPIRATORY_TRACT | Status: DC

## 2010-11-21 NOTE — Telephone Encounter (Signed)
Faxed to provided number  

## 2010-11-26 ENCOUNTER — Other Ambulatory Visit: Payer: Self-pay | Admitting: Family Medicine

## 2011-02-01 ENCOUNTER — Encounter: Payer: Self-pay | Admitting: Family Medicine

## 2011-02-01 ENCOUNTER — Ambulatory Visit (INDEPENDENT_AMBULATORY_CARE_PROVIDER_SITE_OTHER): Payer: Managed Care, Other (non HMO) | Admitting: Family Medicine

## 2011-02-01 DIAGNOSIS — J40 Bronchitis, not specified as acute or chronic: Secondary | ICD-10-CM

## 2011-02-01 DIAGNOSIS — I10 Essential (primary) hypertension: Secondary | ICD-10-CM

## 2011-02-01 MED ORDER — GUAIFENESIN ER 600 MG PO TB12: 600.0000 mg | ORAL_TABLET | Freq: Two times a day (BID) | ORAL | Status: DC

## 2011-02-01 MED ORDER — METHYLPREDNISOLONE (PAK) 4 MG PO TABS: 4.0000 mg | ORAL_TABLET | Freq: Every day | ORAL | Status: DC

## 2011-02-01 MED ORDER — CHLORPHENIRAMINE-HYDROCODONE 8-10 MG/5ML PO LQCR: 5.0000 mL | Freq: Two times a day (BID) | ORAL | Status: DC | PRN

## 2011-02-01 MED ORDER — LEVALBUTEROL TARTRATE 45 MCG/ACT IN AERO: 2.0000 | INHALATION_SPRAY | Freq: Four times a day (QID) | RESPIRATORY_TRACT | Status: DC | PRN

## 2011-02-01 MED ORDER — AMOXICILLIN-POT CLAVULANATE 875-125 MG PO TABS: 1.0000 | ORAL_TABLET | Freq: Two times a day (BID) | ORAL | Status: AC

## 2011-02-01 NOTE — Patient Instructions (Signed)
Dehydration Dehydration is the reduction of water and fluid from the body to a level below that required for proper functioning. CAUSES  Dehydration occurs when there is excessive fluid loss from the body or when loss of normal fluids is not adequately replaced.  Loss of fluids occurs in vomiting, diarrhea, excessive sweating, excessive urine output, or excessive loss of fluid from the lungs (as occurs in fever or in patients on a ventilator).   Inadequate fluid replacement occurs with nausea or decreased appetite due to illness, sore throat, or mouth pain.  SYMPTOMS  Mild dehydration  Thirst (infants and young children may not be able to tell you they are thirsty).   Dry lips.   Slightly dry mouth membranes.  Moderate dehydration  Very dry mouth membranes.   Sunken eyes.   Sunken soft spot (fontanelle) on infant's head.   Skin does not bounce back quickly when lightly pinched and released.   Decreased urine production.   Decreased tear production.  Severe dehydration  Rapid, weak pulse (more than 100 beats per minute at rest).   Cold hands and feet.   Loss of ability to sweat in spite of heat and temperature.   Rapid breathing.   Blue lips.   Confusion, lethargy, difficult to arouse.   Minimal urine production.   No tears.  DIAGNOSIS  Your caregiver will diagnose dehydration based on your symptoms and your exam. Blood and urine tests will help confirm the diagnosis. The diagnostic evaluation should also identify the cause of dehydration. PREVENTION  The body depends on a proper balance of fluid and salts (electrolytes) for normal function. Adequate fluid intake in the presence of illness or other stresses (such as extreme exercise) is important.  TREATMENT   Mild dehydration is safe to self-treat for most ages as long as it does not worsen. Contact your caregiver for even mild dehydration in infants and the elderly.   In teenagers and adults with moderate  dehydration, careful home treatment (as outlined below) can be safe. Phone contact with a caregiver is advised. Children under 44 years of age with moderate dehydration should see a caregiver.   If you or your child is severely dehydrated, go to a hospital for treatment. Intravenous (IV) fluids will quickly reverse dehydration and are often lifesaving in young children, infants, and elderly persons.  HOME CARE INSTRUCTIONS  Small amounts of fluids should be taken frequently. Large amounts at one time may not be tolerated. Plain water may be harmful in infants and the elderly. Oral rehydration solutions (ORS) are available at pharmacies and grocery stores. ORS replaces water and important electrolytes in proper proportions. Sports drinks are not as effective as ORS and may be harmful because the sugar can make diarrhea worse.  As a general guideline for children, replace any new fluid losses from diarrhea and/or vomiting with ORS as follows:   If your child weighs 22 pounds or under (10 kg or less), give 60-120 mL (1/4-1/2 cup or 2-4 ounces) of ORS for each diarrheal stool or vomiting episode.   If your child weighs more than 22 pounds (more than 10 kg), give 120-240 mL (1/2-1 cup or 4-8 ounces) of ORS for each diarrheal stool or vomiting episode.   If your child is vomiting, it may be helpful to give the above ORS replacement in 5 mL (1 teaspoon) amounts every 5 minutes and increase as tolerated.   While correcting for dehydration, children should eat normally. However, foods high in sugar should be  avoided because they may worsen diarrhea. Large amounts of carbonated soft drinks, juice, gelatin desserts, and other highly sugared drinks should be avoided.   After correction of dehydration, other liquids that are appealing to the child may be added. Children should drink small amounts of fluids frequently and fluids should be increased as tolerated. Children should drink enough fluids to keep urine  clear or pale yellow.   Adults should eat normally while drinking more fluids than usual. Drink small amounts of fluids frequently and increase the amount as tolerated. Drink enough fluids to keep urine clear or pale yellow. Broths, weak decaffeinated tea, lemon-lime soft drinks (allowed to go flat), and ORS replace fluids and electrolytes.   Avoid:   Carbonated drinks.   Juice.   Extremely hot or cold fluids.   Caffeine drinks.   Fatty, greasy foods.   Alcohol.   Tobacco.   Too much intake of anything at one time.   Gelatin desserts.   Probiotics are active cultures of beneficial bacteria. They may lessen the amount and number of diarrheal stools in adults. Probiotics can be found in yogurt with active cultures and in supplements.   Wash your hands well to avoid spreading germs (bacteria) and viruses.   Antidiarrheal medicines are not recommended for infants and children.   Only take over-the-counter or prescription medicines for pain, discomfort, or fever as directed by your caregiver. Do not give aspirin to children.   For adults with dehydration, ask your caregiver if you should continue all prescribed and over-the-counter medicines.   If your caregiver has given you a follow-up appointment, it is very important to keep that appointment. Not keeping the appointment could result in a lasting (chronic) or permanent injury and disability. If there is any problem keeping the appointment, you must call to reschedule.  SEEK IMMEDIATE MEDICAL CARE IF:   You are unable to keep fluids down or other symptoms become worse despite treatment.   Vomiting or diarrhea develops and becomes persistent.   There is vomiting of blood or green matter (bile).   There is blood in the stool or the stools are black and tarry.   There is no urine output in 6 to 8 hours or there is only a small amount of very dark urine.   Abdominal pain develops, increases, or localizes.   You or your child  has an oral temperature above 102 F (38.9 C), not controlled by medicine.   Your baby is older than 3 months with a rectal temperature of 102.97F (38.9 C) or higher.   Your baby is 58 months old or younger with a rectal temperature of 100.4 F (38 C) or higher.   You develop excessive weakness, dizziness, fainting, or extreme thirst.   You develop a rash, stiff neck, severe headache, or you become irritable, sleepy, or difficult to awaken.  MAKE SURE YOU:   Understand these instructions.   Will watch your condition.   Will get help right away if you are not doing well or get worse.  Document Released: 02/27/2005 Document Revised: 09/12/2010 Document Reviewed: 01/26/2009 [facility] Patient Information 2012 [place].Bronchitis Bronchitis is the body's way of reacting to injury and/or infection (inflammation) of the bronchi. Bronchi are the air tubes that extend from the windpipe into the lungs. If the inflammation becomes severe, it may cause shortness of breath. CAUSES  Inflammation may be caused by:  A virus.   Germs (bacteria).   Dust.   Allergens.   Pollutants and many  other irritants.  The cells lining the bronchial tree are covered with tiny hairs (cilia). These constantly beat upward, away from the lungs, toward the mouth. This keeps the lungs free of pollutants. When these cells become too irritated and are unable to do their job, mucus begins to develop. This causes the characteristic cough of bronchitis. The cough clears the lungs when the cilia are unable to do their job. Without either of these protective mechanisms, the mucus would settle in the lungs. Then you would develop pneumonia. Smoking is a common cause of bronchitis and can contribute to pneumonia. Stopping this habit is the single most important thing you can do to help yourself. TREATMENT   Your caregiver may prescribe an antibiotic if the cough is caused by bacteria. Also, medicines that open up your  airways make it easier to breathe. Your caregiver may also recommend or prescribe an expectorant. It will loosen the mucus to be coughed up. Only take over-the-counter or prescription medicines for pain, discomfort, or fever as directed by your caregiver.   Removing whatever causes the problem (smoking, for example) is critical to preventing the problem from getting worse.   Cough suppressants may be prescribed for relief of cough symptoms.   Inhaled medicines may be prescribed to help with symptoms now and to help prevent problems from returning.   For those with recurrent (chronic) bronchitis, there may be a need for steroid medicines.  SEEK IMMEDIATE MEDICAL CARE IF:   During treatment, you develop more pus-like mucus (purulent sputum).   You have a fever.   Your baby is older than 3 months with a rectal temperature of 102 F (38.9 C) or higher.   Your baby is 73 months old or younger with a rectal temperature of 100.4 F (38 C) or higher.   You become progressively more ill.   You have increased difficulty breathing, wheezing, or shortness of breath.  It is necessary to seek immediate medical care if you are elderly or sick from any other disease. MAKE SURE YOU:   Understand these instructions.   Will watch your condition.   Will get help right away if you are not doing well or get worse.  Document Released: 02/27/2005 Document Revised: 11/09/2010 Document Reviewed: 01/07/2008 [facility] Patient Information 2012 [place].

## 2011-02-01 NOTE — Assessment & Plan Note (Addendum)
Elevated today but patient acutely ill and taking some OTC decongestants, encouraged to stop all  OTC meds for now and monitor bp

## 2011-02-01 NOTE — Assessment & Plan Note (Addendum)
Patient is dehydrated, tachycardic, hypoxic and sob and struggling with weakness and chest discomfort. ON further questioning she has not taken any of her antihypertensives today including her Verapamil. She would like to avoid a trip to urgent care. She agrees to go straight home after picking up her antibiotics, Tussionex and Mucinex. She is dehydrated so she will push clear fluids and take her Calan immediately if she worsens or does not improve she will present to ER for Further evaluation

## 2011-02-07 ENCOUNTER — Other Ambulatory Visit: Payer: Self-pay | Admitting: Internal Medicine

## 2011-02-08 ENCOUNTER — Other Ambulatory Visit (HOSPITAL_BASED_OUTPATIENT_CLINIC_OR_DEPARTMENT_OTHER): Payer: Managed Care, Other (non HMO) | Admitting: Lab

## 2011-02-08 ENCOUNTER — Ambulatory Visit (HOSPITAL_BASED_OUTPATIENT_CLINIC_OR_DEPARTMENT_OTHER): Payer: Managed Care, Other (non HMO) | Admitting: Oncology

## 2011-02-08 ENCOUNTER — Telehealth: Payer: Self-pay | Admitting: *Deleted

## 2011-02-08 DIAGNOSIS — E559 Vitamin D deficiency, unspecified: Secondary | ICD-10-CM

## 2011-02-08 DIAGNOSIS — Z87891 Personal history of nicotine dependence: Secondary | ICD-10-CM

## 2011-02-08 DIAGNOSIS — C50919 Malignant neoplasm of unspecified site of unspecified female breast: Secondary | ICD-10-CM

## 2011-02-08 DIAGNOSIS — Z170 Estrogen receptor positive status [ER+]: Secondary | ICD-10-CM

## 2011-02-08 DIAGNOSIS — C50219 Malignant neoplasm of upper-inner quadrant of unspecified female breast: Secondary | ICD-10-CM

## 2011-02-08 DIAGNOSIS — J40 Bronchitis, not specified as acute or chronic: Secondary | ICD-10-CM

## 2011-02-08 LAB — CBC WITH DIFFERENTIAL/PLATELET
Basophils Absolute: 0 10*3/uL (ref 0.0–0.1)
EOS%: 0.2 % (ref 0.0–7.0)
HCT: 43.8 % (ref 34.8–46.6)
HGB: 14.8 g/dL (ref 11.6–15.9)
LYMPH%: 15.6 % (ref 14.0–49.7)
MCH: 30.3 pg (ref 25.1–34.0)
MCV: 89.6 fL (ref 79.5–101.0)
MONO%: 1.2 % (ref 0.0–14.0)
NEUT%: 82.7 % — ABNORMAL HIGH (ref 38.4–76.8)

## 2011-02-08 NOTE — Progress Notes (Signed)
Hematology and Oncology Follow Up Visit  [name] [id number] 02-Jul-1965 45 y.o. 02/08/2011 6:45 PM   Principle Diagnosis: 45 year old woman with history of T1 N0 ER/PR positive breast cancer with high recurrence score status post 4 cycles of TC chemotherapy, and mastectomy.  Current history of TAH/BSO for ovarian torsion and ovarian cyst, currently on Arimidex History  of multiple other comorbid problems  Interim History:  She is doing reasonably well. She is tolerating Arimidex. She continues to have neuropathic-type sensations in her hands and feet and is taking Neurontin. This seems to help. She is taking Arimidex without much in the way of side effects though she does have hot flashes on occasion. She continues to smoke about half a pack a day. She has ongoing issues with sinusitis and airway disease. He is due for a followup mammogram of the right breast in the spring  Medications: I have reviewed the patient's current medications.  Allergies:  Allergies  Allergen Reactions  . Metoprolol     REACTION: cp  . Sulfonamide Derivatives     Past Medical History, Surgical history, Social history, and Family History were reviewed and updated.  Review of Systems: Constitutional:  Negative for fever, chills, night sweats, anorexia, weight loss, pain. Cardiovascular: no chest pain or dyspnea on exertion Respiratory: + cough, + shortness of breath, + wheezing Neurological: no TIA or stroke symptoms Dermatological: negative ENT: negative Skin Gastrointestinal: no abdominal pain, change in bowel habits, or black or bloody stools Genito-Urinary: no dysuria, trouble voiding, or hematuria Hematological and Lymphatic: negative Breast: negative for breast lumps Musculoskeletal: negative Remaining ROS negative.  Physical Exam: Blood pressure 117/78, pulse 101, temperature 98.3 F (36.8 C), height 5' 4"  (1.626 m), weight 247 lb 14.4 oz (112.447 kg), last menstrual period  07/14/2010. ECOG:  General appearance: alert, cooperative and appears stated age Head: Normocephalic, without obvious abnormality, atraumatic Neck: no adenopathy, no carotid bruit, no JVD, supple, symmetrical, trachea midline and thyroid not enlarged, symmetric, no tenderness/mass/nodules Lymph nodes: Cervical, supraclavicular, and axillary nodes normal. Cardiac : Normal heart sounds Pulmonary: expiratory wheezes heard with decreased air entry in both lower lung field Breasts: Right breast is normal the left breast is status post mastectomy. There is no evidence of local recurrence. The axilla bilaterally are negative. The right breast is free of any masses, nipple retraction or skin changes Abdomen: Protuberant, with no masses or organomegaly  Extremities normal Neuro: Normal  Lab Results: Lab Results  Component Value Date   WBC 8.1 02/08/2011   HGB 14.8 02/08/2011   HCT 43.8 02/08/2011   MCV 89.6 02/08/2011   PLT 326 02/08/2011     Chemistry      Component Value Date/Time   NA 138 02/08/2011 1315   K 4.1 02/08/2011 1315   CL 102 02/08/2011 1315   CO2 26 02/08/2011 1315   BUN 10 02/08/2011 1315   CREATININE 0.83 02/08/2011 1315      Component Value Date/Time   CALCIUM 10.1 02/08/2011 1315   ALKPHOS 42 02/08/2011 1315   AST 37 02/08/2011 1315   ALT 76* 02/08/2011 1315   BILITOT 0.4 02/08/2011 1315       Radiological Studies: chest X-ray n/a  Mammogram pending Bone density n/a  Impression and Plan: Woman with history of breast cancer status post mastectomy and TAH/BSO, history of chronic smoking and persistent bronchitis. We will obtain a mammogram in the new year and I will see her in 6 months. I've encouraged her to cut back and/or  quit smoking and also try to exercise. Her comorbid problems continued to be an issue. He is a candidate for CT screening for lung cancer as well.  More than 50% of the visit was spent in patient-related counselling   Eston Esters,  MD 11/28/20126:45 PM

## 2011-02-08 NOTE — Telephone Encounter (Signed)
gave patient appointment for 07-2011

## 2011-02-09 LAB — COMPREHENSIVE METABOLIC PANEL
AST: 37 U/L (ref 0–37)
Alkaline Phosphatase: 42 U/L (ref 39–117)
BUN: 10 mg/dL (ref 6–23)
Calcium: 10.1 mg/dL (ref 8.4–10.5)
Creatinine, Ser: 0.83 mg/dL (ref 0.50–1.10)
Total Bilirubin: 0.4 mg/dL (ref 0.3–1.2)

## 2011-02-13 NOTE — Progress Notes (Signed)
[name] [id number] [birth year] 02/13/2011      Progress Note-Follow Up  Subjective  Chief Complaint  Chief Complaint  Patient presents with  . Bronchitis    X 2 days  . chest congestion    X 2 days  . Cough    X 2 days    HPI  Patient is a 45 year old Caucasian history including breast cancer. She has been struggling with worsening congestion, cough and malaise a week. She is noting difficulty with lying down due to shortness of breath that occurs. She has anorexia, malaise, myalgia, abdominal wall and chest wall pain with coughing. The cough is severe enough to keep her awake. It is generally nonproductive. No recent diarrhea or vomiting. Her appetite however is poor and she has not been taking good fluids. She has been using some over-the-counter cough suppressants.  Past Medical History  Diagnosis Date  . Breast cancer     left  . ADD (attention deficit disorder with hyperactivity)   . Allergic rhinitis   . Asthma   . Hypertension   . Hyperlipidemia   . GERD (gastroesophageal reflux disease)   . Morbid obesity   . RLS (restless legs syndrome)   . OSA (obstructive sleep apnea)   . Depression   . Bowel obstruction 06-25-2010  . Ovarian mass   . Bronchitis 02/01/2011    Past Surgical History  Procedure Date  . Mastectomy 06-21-2010    Left     Family History  Problem Relation Age of Onset  . Adopted: Yes    History   Social History  . Marital Status: Married    Spouse Name: N/A    Number of Children: 2  . Years of Education: N/A   Occupational History  . Jewelry maker    Social History Main Topics  . Smoking status: Current Everyday Smoker -- 0.5 packs/day  . Smokeless tobacco: Never Used  . Alcohol Use: No     rare  . Drug Use: No  . Sexually Active: Not on file   Other Topics Concern  . Not on file   Social History Narrative   2-3 caffeine drinks daily     Current Outpatient Prescriptions on File Prior to Visit  Medication Sig  Dispense Refill  . albuterol (PROVENTIL,VENTOLIN) 90 MCG/ACT inhaler Inhale 2 puffs into the lungs every 6 (six) hours as needed.        . calcium carbonate (OS-CAL) 600 MG TABS Take 600 mg by mouth daily.        . cetirizine (ZYRTEC) 10 MG tablet Take 10 mg by mouth daily.        . citalopram (CELEXA) 20 MG tablet TAKE 1 TABLET BY MOUTH EVERY DAY  90 tablet  2  . Famotidine-Ca Carb-Mag Hydrox (ACID CONTROLLER COMPLETE PO) Take by mouth. One daily       . Fluticasone-Salmeterol (ADVAIR DISKUS) 250-50 MCG/DOSE AEPB Inhale 1 puff into the lungs every 12 (twelve) hours.  60 each  4  . ibuprofen (ADVIL,MOTRIN) 400 MG tablet Take 400 mg by mouth as needed.        . Multiple Vitamin (MULTIVITAMIN) tablet Take 1 tablet by mouth daily.        Marland Kitchen rOPINIRole (REQUIP) 0.5 MG tablet TAKE 1 TABLET BY MOUTH EVERY DAY AFTER DINNER  30 tablet  3  . triamterene-hydrochlorothiazide (MAXZIDE-25) 37.5-25 MG per tablet TAKE 1/2 TABLET BY MOUTH EVERY DAY  15 tablet  3    Allergies  Allergen Reactions  .  Metoprolol     REACTION: cp  . Sulfonamide Derivatives     Review of Systems  Review of Systems  Constitutional: Positive for fever and malaise/fatigue.  HENT: Positive for congestion. Negative for sore throat.   Eyes: Negative for discharge.  Respiratory: Positive for cough, sputum production and shortness of breath.   Cardiovascular: Positive for palpitations. Negative for chest pain and leg swelling.  Gastrointestinal: Negative for nausea, abdominal pain and diarrhea.  Genitourinary: Negative for dysuria.  Musculoskeletal: Negative for falls.  Skin: Negative for rash.  Neurological: Positive for headaches. Negative for loss of consciousness.  Endo/Heme/Allergies: Negative for polydipsia.  Psychiatric/Behavioral: Negative for depression and suicidal ideas. The patient is not nervous/anxious and does not have insomnia.     Objective  BP 151/81  Pulse 115  Temp(Src) 99 F (37.2 C) (Oral)  Wt 249  lb (112.946 kg)  SpO2 92%  LMP 07/14/2010  Physical Exam  Physical Exam  Constitutional: She is oriented to person, place, and time and well-developed, well-nourished, and in no distress. No distress.  HENT:  Head: Normocephalic and atraumatic.       Dry mucus membranes  Eyes: Conjunctivae are normal.  Neck: Neck supple. No thyromegaly present.  Cardiovascular: Regular rhythm and normal heart sounds.   No murmur heard. Pulmonary/Chest: Effort normal. She has no wheezes.       Decreased bs b/l bases  Abdominal: She exhibits no distension and no mass.  Musculoskeletal: She exhibits no edema.  Lymphadenopathy:    She has no cervical adenopathy.  Neurological: She is alert and oriented to person, place, and time. Coordination abnormal.  Skin: Skin is warm and dry. No rash noted. She is not diaphoretic.  Psychiatric: Memory, affect and judgment normal.    Lab Results  Component Value Date   TSH 1.45 05/03/2010   Lab Results  Component Value Date   WBC 8.1 02/08/2011   HGB 14.8 02/08/2011   HCT 43.8 02/08/2011   MCV 89.6 02/08/2011   PLT 326 02/08/2011   Lab Results  Component Value Date   CREATININE 0.83 02/08/2011   BUN 10 02/08/2011   NA 138 02/08/2011   K 4.1 02/08/2011   CL 102 02/08/2011   CO2 26 02/08/2011   Lab Results  Component Value Date   ALT 76* 02/08/2011   AST 37 02/08/2011   ALKPHOS 42 02/08/2011   BILITOT 0.4 02/08/2011   Lab Results  Component Value Date   CHOL 166 05/03/2010   Lab Results  Component Value Date   HDL 33.80* 05/03/2010   Lab Results  Component Value Date   LDLCALC 102* 05/03/2010   Lab Results  Component Value Date   TRIG 153.0* 05/03/2010   Lab Results  Component Value Date   CHOLHDL 5 05/03/2010     Assessment & Plan  Bronchitis Patient is dehydrated, tachycardic, hypoxic and sob and struggling with weakness and chest discomfort. ON further questioning she has not taken any of her antihypertensives today including  her Verapamil. She would like to avoid a trip to urgent care. She agrees to go straight home after picking up her antibiotics, Tussionex and Mucinex. She is dehydrated so she will push clear fluids and take her Calan immediately if she worsens or does not improve she will present to ER for Further evaluation  HYPERTENSION Elevated today but patient acutely ill and taking some OTC decongestants, encouraged to stop all  OTC meds for now and monitor bp

## 2011-02-27 ENCOUNTER — Other Ambulatory Visit: Payer: Self-pay | Admitting: Internal Medicine

## 2011-04-19 ENCOUNTER — Other Ambulatory Visit: Payer: Self-pay | Admitting: Internal Medicine

## 2011-04-19 ENCOUNTER — Ambulatory Visit (INDEPENDENT_AMBULATORY_CARE_PROVIDER_SITE_OTHER): Payer: Managed Care, Other (non HMO) | Admitting: Surgery

## 2011-04-19 ENCOUNTER — Encounter (INDEPENDENT_AMBULATORY_CARE_PROVIDER_SITE_OTHER): Payer: Self-pay | Admitting: Surgery

## 2011-04-19 VITALS — BP 164/102 | HR 98 | Temp 97.2°F | Resp 20 | Ht 64.0 in | Wt 252.4 lb

## 2011-04-19 DIAGNOSIS — Z9012 Acquired absence of left breast and nipple: Secondary | ICD-10-CM

## 2011-04-19 DIAGNOSIS — C50212 Malignant neoplasm of upper-inner quadrant of left female breast: Secondary | ICD-10-CM | POA: Insufficient documentation

## 2011-04-19 DIAGNOSIS — Z853 Personal history of malignant neoplasm of breast: Secondary | ICD-10-CM

## 2011-04-19 DIAGNOSIS — Z1231 Encounter for screening mammogram for malignant neoplasm of breast: Secondary | ICD-10-CM

## 2011-04-19 NOTE — Progress Notes (Signed)
[place]  Alexa Overall, MD,  [address]  [place], [place]    [place] Phone:  [phone] FAX:  [phone]   Re:   [name] DOB:   1965/08/06 MRN:   [id number]  ASSESSMENT AND PLAN: 1.  T1b, N0 left breast cancer.  Left mastectomy - 06/21/2010.  ER - 78%.  PR - 0%.  Ki67 - 25%.  Her2Neu - neg.  Treating oncologist - Dr. Thomasene Ripple.  On Arimidex, tolerating well.  Will see back in 6 months.  2.  Pelvic mass.  This was removed at [facility] by Dr. Vanessa Kick in May 2012.  This was benign. 3.  HTN. 4.  Asthma. 5.  Morbid obesity.  HISTORY OF PRESENT ILLNESS: Chief Complaint  Patient presents with  . Breast Cancer Long Term Follow Up   [name] is a 46 y.o. (DOB: 1965-04-08)  white female who is a patient of Unice Cobble, MD, MD and comes to me today for left breast cancer. She is doing well.  No new problem or complaint.  She is accompanied by her mother.  PHYSICAL EXAM: BP 164/102  Pulse 98  Temp(Src) 97.2 F (36.2 C) (Temporal)  Resp 20  Ht 5' 4"  (1.626 m)  Wt 252 lb 6.4 oz (114.488 kg)  BMI 43.32 kg/m2  LMP 07/14/2010  HEENT:  Pupils equal.  Dentition good.  No injury. NECK:  Supple.  No thyroid mass. LYMPH NODES:  No cervical, supraclavicular, or axillary adenopathy. BREASTS -  RIGHT:  No palpable mass or nodule.  No nipple discharge.   LEFT:  No palpable mass or nodule.  Left breast absent. She has some numbness laterally on the mastectomy incision. UPPER EXTREMITIES:  No evidence of lymphedema.  DATA REVIEWED: For mammogram - March 2013.  Alexa Overall, MD, [place] Office:  [phone]

## 2011-04-21 ENCOUNTER — Other Ambulatory Visit: Payer: Self-pay | Admitting: Internal Medicine

## 2011-04-21 MED ORDER — ALBUTEROL 90 MCG/ACT IN AERS: 2.0000 | INHALATION_SPRAY | Freq: Four times a day (QID) | RESPIRATORY_TRACT | Status: DC | PRN

## 2011-04-21 NOTE — Telephone Encounter (Signed)
Last OV 06/07/2010, please advise on refill, I do not see where med was rx'ed here previously although it is on med list

## 2011-04-21 NOTE — Telephone Encounter (Signed)
Rx sent 

## 2011-04-21 NOTE — Telephone Encounter (Signed)
OK X1 

## 2011-04-28 ENCOUNTER — Encounter (HOSPITAL_COMMUNITY): Payer: Self-pay

## 2011-05-15 ENCOUNTER — Ambulatory Visit: Payer: Managed Care, Other (non HMO)

## 2011-05-16 ENCOUNTER — Ambulatory Visit
Admission: RE | Admit: 2011-05-16 | Discharge: 2011-05-16 | Disposition: A | Discharge status: Home / Self Care | Payer: Managed Care, Other (non HMO) | Source: Ambulatory Visit | Attending: Internal Medicine | Admitting: Internal Medicine

## 2011-05-16 DIAGNOSIS — Z1231 Encounter for screening mammogram for malignant neoplasm of breast: Secondary | ICD-10-CM

## 2011-05-16 DIAGNOSIS — Z9012 Acquired absence of left breast and nipple: Secondary | ICD-10-CM

## 2011-07-06 ENCOUNTER — Encounter: Payer: Managed Care, Other (non HMO) | Admitting: Internal Medicine

## 2011-07-07 ENCOUNTER — Other Ambulatory Visit: Payer: Self-pay

## 2011-07-07 MED ORDER — ROPINIROLE HCL 0.5 MG PO TABS: 0.5000 mg | ORAL_TABLET | Freq: Every day | ORAL | Status: DC

## 2011-08-08 ENCOUNTER — Ambulatory Visit: Payer: Managed Care, Other (non HMO) | Admitting: Oncology

## 2011-08-08 ENCOUNTER — Other Ambulatory Visit: Payer: Managed Care, Other (non HMO) | Admitting: Lab

## 2011-08-14 ENCOUNTER — Other Ambulatory Visit: Payer: Self-pay | Admitting: Internal Medicine

## 2011-08-18 ENCOUNTER — Encounter: Payer: Self-pay | Admitting: Internal Medicine

## 2011-08-18 ENCOUNTER — Ambulatory Visit (INDEPENDENT_AMBULATORY_CARE_PROVIDER_SITE_OTHER): Payer: Managed Care, Other (non HMO) | Admitting: Internal Medicine

## 2011-08-18 ENCOUNTER — Telehealth: Payer: Self-pay | Admitting: *Deleted

## 2011-08-18 VITALS — BP 126/82 | HR 91 | Temp 98.1°F | Resp 14 | Ht 66.0 in | Wt 252.4 lb

## 2011-08-18 DIAGNOSIS — F988 Other specified behavioral and emotional disorders with onset usually occurring in childhood and adolescence: Secondary | ICD-10-CM

## 2011-08-18 DIAGNOSIS — R7309 Other abnormal glucose: Secondary | ICD-10-CM

## 2011-08-18 DIAGNOSIS — G629 Polyneuropathy, unspecified: Secondary | ICD-10-CM

## 2011-08-18 DIAGNOSIS — J45909 Unspecified asthma, uncomplicated: Secondary | ICD-10-CM

## 2011-08-18 DIAGNOSIS — G609 Hereditary and idiopathic neuropathy, unspecified: Secondary | ICD-10-CM

## 2011-08-18 DIAGNOSIS — G4733 Obstructive sleep apnea (adult) (pediatric): Secondary | ICD-10-CM

## 2011-08-18 DIAGNOSIS — Z0000 Encounter for general adult medical examination without abnormal findings: Secondary | ICD-10-CM

## 2011-08-18 LAB — BASIC METABOLIC PANEL
BUN: 15 mg/dL (ref 6–23)
CO2: 29 mEq/L (ref 19–32)
Chloride: 106 mEq/L (ref 96–112)
Glucose, Bld: 96 mg/dL (ref 70–99)
Potassium: 3.6 mEq/L (ref 3.5–5.1)
Sodium: 139 mEq/L (ref 135–145)

## 2011-08-18 LAB — CBC WITH DIFFERENTIAL/PLATELET
Basophils Absolute: 0 10*3/uL (ref 0.0–0.1)
HCT: 38.6 % (ref 36.0–46.0)
Hemoglobin: 13.1 g/dL (ref 12.0–15.0)
Lymphs Abs: 2.1 10*3/uL (ref 0.7–4.0)
MCHC: 34 g/dL (ref 30.0–36.0)
MCV: 93.3 fl (ref 78.0–100.0)
Monocytes Absolute: 0.4 10*3/uL (ref 0.1–1.0)
Monocytes Relative: 6.8 % (ref 3.0–12.0)
Neutro Abs: 3.7 10*3/uL (ref 1.4–7.7)
Platelets: 299 10*3/uL (ref 150.0–400.0)
RDW: 13.5 % (ref 11.5–14.6)

## 2011-08-18 LAB — LIPID PANEL
HDL: 29.3 mg/dL — ABNORMAL LOW (ref >39.00)
Total CHOL/HDL Ratio: 5
VLDL: 59.8 mg/dL — ABNORMAL HIGH (ref 0.0–40.0)

## 2011-08-18 LAB — HEPATIC FUNCTION PANEL
Albumin: 3.9 g/dL (ref 3.5–5.2)
Alkaline Phosphatase: 48 U/L (ref 39–117)
Bilirubin, Direct: 0 mg/dL (ref 0.0–0.3)
Total Bilirubin: 0.3 mg/dL (ref 0.3–1.2)

## 2011-08-18 LAB — HEMOGLOBIN A1C: Hgb A1c MFr Bld: 6.2 % (ref 4.6–6.5)

## 2011-08-18 LAB — LDL CHOLESTEROL, DIRECT: Direct LDL: 86.4 mg/dL

## 2011-08-18 LAB — MICROALBUMIN / CREATININE URINE RATIO: Microalb, Ur: 1.1 mg/dL (ref 0.0–1.9)

## 2011-08-18 NOTE — Progress Notes (Signed)
Addended by: Logan Bores on: 08/18/2011 10:34 AM   Modules accepted: Orders

## 2011-08-18 NOTE — Progress Notes (Signed)
Subjective:    Patient ID: [name], female    DOB: [birth year], 46 y.o.   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      Review of Systems She gets partial benefit with gabapentin; symptoms are greater in hands and feet.  Her asthma has multiple triggers it is mild. She uses an albuterol rescue inhaler on average once monthly. She's been unable to afford the maintenance agents. She continues to smoke half a pack a day.  Her sleep apnea  is not being treated at this time as she is intolerant to CPAP. She is on Requip for restless leg syndrome.  She is not on any ADD  agents at this time.    Objective:   Physical Exam Gen.:  well-nourished but weight excess. Alert, appropriate and cooperative throughout exam. Head: Normocephalic without obvious abnormalities.  Eyes: No corneal or conjunctival inflammation noted. Pupils equal round reactive to light and accommodation. Fundal exam is benign without hemorrhages, exudate, papilledema. Extraocular motion intact. Vision grossly normal. Ears: External  ear exam reveals no significant lesions or deformities. Canals clear .TMs normal. Hearing is grossly normal bilaterally. Nose: External nasal exam reveals no deformity or inflammation. Nasal mucosa are pink and moist. No lesions or exudates noted.  Mouth: Oral mucosa and oropharynx reveal no lesions or exudates. Teeth in good repair. Neck: No deformities, masses, or tenderness noted. Range of motion & Thyroid normal Lungs: Normal respiratory effort; chest expands symmetrically. Lungs are clear to auscultation without rales, wheezes, or increased work of breathing. Heart: Normal rate and rhythm. Normal S1 and S2. No gallop, click, or rub. S 4 w/o  murmur. Abdomen: Bowel sounds normal; abdomen soft and nontender. No masses, organomegaly or hernias noted. Striae present Genitalia: Dr Vanessa Kick                                                                                Musculoskeletal/extremities: No deformity or scoliosis noted of  the thoracic or lumbar spine. No clubbing, cyanosis, edema, or deformity noted. Range of motion  normal .Tone & strength  normal.Joints normal. Nail health  good. Vascular: Carotid, radial artery, dorsalis pedis and  posterior tibial pulses are full and equal. No bruits present. Neurologic: Alert and oriented x3. Deep tendon reflexes symmetrical and normal.   Light touch normal over hands but decreased over  feet . Thick callus over left medial heel/arch. Pes planus    Skin: Intact without suspicious lesions or rashes. Lymph: No cervical, axillary lymphadenopathy present. Psych: Mood and affect are normal. Normally interactive                                                                                         Assessment & Plan:  #1 comprehensive physical exam; no acute findings #  2 see Problem List with Assessments & Recommendations Plan: see Orders

## 2011-08-18 NOTE — Patient Instructions (Signed)
Preventive Health Care: Exercise  30-45  minutes a day, 3-4 days a week. Walking is especially valuable in preventing Osteoporosis. Eat a low-fat diet with lots of fruits and vegetables, up to 7-9 servings per day. Avoid obesity; your goal = waist less than 35 inches.Consume less than 30 grams of sugar per day from foods & drinks with High Fructose Corn Syrup as # 1,2,3 or #4 on label. Health Care Power of [place] Will place you in charge of your health care  decisions. Verify these are  in place. Consider  [facility]'s smoking cessation program @ [email or web address] or 361-616-2949.    Please try to go on My Chart within the next 24 hours to allow me to release the results directly to you.

## 2011-08-18 NOTE — Telephone Encounter (Signed)
[facility] Triage Call Report Triage Record Num: [id number] Operator: Benita Stabile Patient Name: [name] Call Date & Time: 08/18/2011 12:10:52AM Patient Phone: [phone] PCP: Patient Gender: Female PCP Fax : Patient DOB: [birth year] Practice Name: [facility] Reason for Call: Caller: Fiza/Patient; PCP: Unice Cobble; CB#: [phone]; Call regarding Asthma [place] calling about asthma flair. Pt has lost Albuterol inhaler, requesting new. No refills left. Emergent s/s r/o. Home care advice given. Last Albuterol inhaler script viewed in EPIC. #1 Albuterol inhaler 2 puffs q 6hours prn called in and pt aware. Will f/u with office in am for appt already scheduled. Protocol(s) Used: Asthma - Adult Recommended Outcome per Protocol: Provide Home/Self Care Reason for Outcome: All other situations Care Advice: An annual influenza vaccination is recommended for all adults 26 years of age or older, those with chronic illness, or in contact with high risk individuals. An immunization for pneumonia is also recommended. The frail elderly may require a second pneumonia immunization 3 to 5 years after the first dose. ~ ~ Follow provider instructions or your specific asthma action plan for your symptoms. ~ If uncertain about provider instructions, speak with provider during regular office hours. Some prescription medications (ACE inhibitors, beta blockers) and over-the-counter medications (NSAIDs) may trigger or worsen asthma symptoms. Talk with your provider if you are taking any of these medications. ~ To maintain control, recommended provider visit schedule is: one to two months for severe persistent asthma; three to four months for moderate persistent asthma; six months to 12 months visits for mild persistent asthma and for mild intermittent asthma. ~ Try to identify possible situations or irritants that triggered your symptoms (including medications) and be sure to tell your  provider. ~ ~ SYMPTOM / CONDITION MANAGEMENT PREVENTION OF ASTHMA RECURRENCES: Decrease allergen and irritant exposure - Wash bedding weekly in hot water and dry in a hot dryer. - Cover pillows and mattresses in airtight covers. - Remove carpets, especially in bedrooms. - Do not sleep on fabric-covered furniture. - Clean house thoroughly. - Keep windows of your car and home closed. - Keep humidity level in your home under 50% by using a dehumidifer or air conditioner. - Change filters of heating and cooling systems monthly. - Avoid exposure to environmental irritants. - Do not smoke and avoid second-hand smoke. - Avoid outside activities on high pollution days. ~ 08/18/2011 12:23:08AM

## 2011-08-18 NOTE — Telephone Encounter (Signed)
Pt seen today

## 2011-08-22 ENCOUNTER — Encounter: Payer: Self-pay | Admitting: *Deleted

## 2011-08-29 ENCOUNTER — Other Ambulatory Visit: Payer: Self-pay | Admitting: Oncology

## 2011-08-29 DIAGNOSIS — Z853 Personal history of malignant neoplasm of breast: Secondary | ICD-10-CM

## 2011-08-30 ENCOUNTER — Telehealth: Payer: Self-pay | Admitting: *Deleted

## 2011-08-30 NOTE — Telephone Encounter (Signed)
Called patient and informed her the Arimidex has been refilled with enough refills for her to call a [facility] scheduler to r/s her appt. She missed on 08-08-11.  Office number given to call to schedule with a scheduler.

## 2011-09-09 ENCOUNTER — Other Ambulatory Visit: Payer: Self-pay | Admitting: Internal Medicine

## 2011-10-04 ENCOUNTER — Encounter (INDEPENDENT_AMBULATORY_CARE_PROVIDER_SITE_OTHER): Payer: Self-pay | Admitting: Surgery

## 2011-10-11 ENCOUNTER — Telehealth: Payer: Self-pay | Admitting: *Deleted

## 2011-10-11 ENCOUNTER — Ambulatory Visit (HOSPITAL_BASED_OUTPATIENT_CLINIC_OR_DEPARTMENT_OTHER): Payer: Managed Care, Other (non HMO) | Admitting: Oncology

## 2011-10-11 ENCOUNTER — Other Ambulatory Visit: Payer: Self-pay | Admitting: Internal Medicine

## 2011-10-11 VITALS — BP 130/85 | HR 97 | Temp 98.8°F | Ht 66.0 in | Wt 263.7 lb

## 2011-10-11 DIAGNOSIS — C50919 Malignant neoplasm of unspecified site of unspecified female breast: Secondary | ICD-10-CM

## 2011-10-11 DIAGNOSIS — C50219 Malignant neoplasm of upper-inner quadrant of unspecified female breast: Secondary | ICD-10-CM

## 2011-10-11 DIAGNOSIS — F17200 Nicotine dependence, unspecified, uncomplicated: Secondary | ICD-10-CM

## 2011-10-11 MED ORDER — NYSTATIN 100000 UNIT/GM EX POWD: Freq: Four times a day (QID) | CUTANEOUS | Status: DC

## 2011-10-11 NOTE — Telephone Encounter (Signed)
Gave patient appointment for six month appointment

## 2011-10-11 NOTE — Progress Notes (Signed)
Hematology and Oncology Follow Up Visit  David Towson [id number] 1966-01-10 46 y.o. 10/11/2011 3:48 PM   Principle Diagnosis: 46 year old woman with history of T1 N0 ER/PR positive breast cancer with high recurrence score status post 4 cycles of TC chemotherapy, and mastectomy.  Current history of TAH/BSO for ovarian torsion and ovarian cyst, currently on Arimidex History  of multiple other comorbid problems  Interim History:  She is doing reasonably well. She is tolerating Arimidex. She is having difficulty using her hands. 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 This has likely relationship with her taking Arimidex as it does with having had an oophorectomy. Mammogram of the right breast was performed in March of this year and was normal. She continues to smoke  Medications: I have reviewed the patient's current medications.  Allergies:  Allergies  Allergen Reactions  . Metoprolol     REACTION: " chest pain"  . Sulfonamide Derivatives     nausea    Past Medical History, Surgical history, Social history, and Family History were reviewed and updated.  Review of Systems: Constitutional:  Negative for fever, chills, night sweats, anorexia, weight loss, pain. Cardiovascular: no chest pain or dyspnea on exertion Respiratory: + cough, + shortness of breath, + wheezing Neurological: no TIA or stroke symptoms Dermatological: negative ENT: negative Skin Gastrointestinal: no abdominal pain, change in bowel habits, or black or bloody stools Genito-Urinary: no dysuria, trouble voiding, or hematuria Hematological and Lymphatic: negative Breast: negative for breast lumps Musculoskeletal: negative Remaining ROS negative.  Physical Exam: Blood pressure 130/85, pulse 97, temperature 98.8 F (37.1 C), temperature source Oral, height 5' 6"  (1.676 m), weight 263 lb 11.2 oz (119.614 kg), last menstrual period 07/14/2010. ECOG:  General appearance: alert, cooperative and appears stated  age Head: Normocephalic, without obvious abnormality, atraumatic Neck: no adenopathy, no carotid bruit, no JVD, supple, symmetrical, trachea midline and thyroid not enlarged, symmetric, no tenderness/mass/nodules Lymph nodes: Cervical, supraclavicular, and axillary nodes normal. Cardiac : Normal heart sounds Pulmonary: expiratory wheezes heard with decreased air entry in both lower lung field Breasts: Right breast is normal the left breast is status post mastectomy. There is no evidence of local recurrence. The axilla bilaterally are negative. The right breast is free of any masses, nipple retraction or skin changes Abdomen: Protuberant, with no masses or organomegaly  Extremities normal Neuro: Normal  Lab Results: Lab Results  Component Value Date   WBC 6.5 08/18/2011   HGB 13.1 08/18/2011   HCT 38.6 08/18/2011   MCV 93.3 08/18/2011   PLT 299.0 08/18/2011     Chemistry      Component Value Date/Time   NA 139 08/18/2011 1039   K 3.6 08/18/2011 1039   CL 106 08/18/2011 1039   CO2 29 08/18/2011 1039   BUN 15 08/18/2011 1039   CREATININE 0.5 08/18/2011 1039      Component Value Date/Time   CALCIUM 9.6 08/18/2011 1039   ALKPHOS 48 08/18/2011 1039   AST 35 08/18/2011 1039   ALT 55* 08/18/2011 1039   BILITOT 0.3 08/18/2011 1039       Radiological Studies: chest X-ray n/a  Mammogram pending Bone density n/a  Impression and Plan: Woman with history of breast cancer status post mastectomy and TAH/BSO, history of chronic smoking and persistent bronchitis. We will obtain a mammogram in the new year and I will see her in 6 months. I've encouraged her to cut back and/or quit smoking and also try to exercise. Her comorbid problems continued to be  an issue. He is a candidate for CT screening for lung cancer as well.  More than 50% of the visit was spent in patient-related counselling   Eston Esters, MD 7/31/20133:48 PM

## 2011-10-11 NOTE — Patient Instructions (Signed)
STOP ARIMIDEX FOR 1 MONTH, LET us KNOW IF PAIN IMPROVES; WE MAY SWITCH TO ANOTHER SIMILAR MEDICINE.

## 2011-10-12 ENCOUNTER — Telehealth: Payer: Self-pay | Admitting: Internal Medicine

## 2011-10-12 MED ORDER — ROPINIROLE HCL 0.5 MG PO TABS: 0.5000 mg | ORAL_TABLET | Freq: Every day | ORAL | Status: DC

## 2011-10-12 NOTE — Telephone Encounter (Signed)
RX sent

## 2011-10-12 NOTE — Telephone Encounter (Signed)
Refill: Ropinirole hcl 0.64m tab. Take 1 tablet by mouth every day. Qty 30. Last fill 07-07-11

## 2011-10-18 ENCOUNTER — Encounter: Payer: Self-pay | Admitting: *Deleted

## 2011-10-19 ENCOUNTER — Telehealth: Payer: Self-pay | Admitting: Oncology

## 2011-10-19 NOTE — Telephone Encounter (Signed)
lmonvm advisng the pt of her sept appt

## 2011-11-27 ENCOUNTER — Ambulatory Visit: Payer: Managed Care, Other (non HMO) | Admitting: Family

## 2011-11-27 NOTE — Progress Notes (Signed)
No show. Will send POF to reschedule.

## 2011-11-29 ENCOUNTER — Telehealth: Payer: Self-pay | Admitting: *Deleted

## 2011-11-29 NOTE — Telephone Encounter (Signed)
Gave patient appointment for 04-03-2012 lab and md starting at 3:30;pm,

## 2011-12-07 ENCOUNTER — Telehealth: Payer: Self-pay | Admitting: *Deleted

## 2011-12-07 NOTE — Telephone Encounter (Signed)
Patient called in and rescheduled her missed appointment from 11-27-2011

## 2012-01-02 ENCOUNTER — Telehealth: Payer: Self-pay | Admitting: Internal Medicine

## 2012-01-02 MED ORDER — CITALOPRAM HYDROBROMIDE 20 MG PO TABS: ORAL_TABLET | ORAL | Status: DC

## 2012-01-02 NOTE — Telephone Encounter (Signed)
Rx sent 

## 2012-01-02 NOTE — Telephone Encounter (Signed)
Refill: citalopram hbr 20 mg. Take 1 tablet by mouth every day. Qty 30. Last fill 8.16.13

## 2012-01-09 ENCOUNTER — Other Ambulatory Visit: Payer: Self-pay | Admitting: Internal Medicine

## 2012-01-11 ENCOUNTER — Emergency Department (HOSPITAL_COMMUNITY)
Admission: EM | Admit: 2012-01-11 | Discharge: 2012-01-12 | Disposition: A | Discharge status: Home / Self Care | Payer: Managed Care, Other (non HMO) | Attending: Emergency Medicine | Admitting: Emergency Medicine

## 2012-01-11 ENCOUNTER — Encounter (HOSPITAL_COMMUNITY): Payer: Self-pay | Admitting: *Deleted

## 2012-01-11 DIAGNOSIS — F17200 Nicotine dependence, unspecified, uncomplicated: Secondary | ICD-10-CM | POA: Insufficient documentation

## 2012-01-11 DIAGNOSIS — F909 Attention-deficit hyperactivity disorder, unspecified type: Secondary | ICD-10-CM | POA: Insufficient documentation

## 2012-01-11 DIAGNOSIS — F3289 Other specified depressive episodes: Secondary | ICD-10-CM | POA: Insufficient documentation

## 2012-01-11 DIAGNOSIS — R062 Wheezing: Secondary | ICD-10-CM | POA: Insufficient documentation

## 2012-01-11 DIAGNOSIS — F329 Major depressive disorder, single episode, unspecified: Secondary | ICD-10-CM | POA: Insufficient documentation

## 2012-01-11 DIAGNOSIS — Z8719 Personal history of other diseases of the digestive system: Secondary | ICD-10-CM | POA: Insufficient documentation

## 2012-01-11 DIAGNOSIS — J45909 Unspecified asthma, uncomplicated: Secondary | ICD-10-CM | POA: Insufficient documentation

## 2012-01-11 DIAGNOSIS — G2581 Restless legs syndrome: Secondary | ICD-10-CM | POA: Insufficient documentation

## 2012-01-11 DIAGNOSIS — K219 Gastro-esophageal reflux disease without esophagitis: Secondary | ICD-10-CM | POA: Insufficient documentation

## 2012-01-11 DIAGNOSIS — Z8669 Personal history of other diseases of the nervous system and sense organs: Secondary | ICD-10-CM | POA: Insufficient documentation

## 2012-01-11 DIAGNOSIS — I10 Essential (primary) hypertension: Secondary | ICD-10-CM | POA: Insufficient documentation

## 2012-01-11 DIAGNOSIS — J069 Acute upper respiratory infection, unspecified: Secondary | ICD-10-CM

## 2012-01-11 DIAGNOSIS — Z853 Personal history of malignant neoplasm of breast: Secondary | ICD-10-CM | POA: Insufficient documentation

## 2012-01-11 DIAGNOSIS — E785 Hyperlipidemia, unspecified: Secondary | ICD-10-CM | POA: Insufficient documentation

## 2012-01-11 DIAGNOSIS — Z79899 Other long term (current) drug therapy: Secondary | ICD-10-CM | POA: Insufficient documentation

## 2012-01-11 MED ORDER — IBUPROFEN 800 MG PO TABS
800.0000 mg | ORAL_TABLET | Freq: Once | ORAL | Status: AC
  Administered 2012-01-12: 800 mg via ORAL
  Filled 2012-01-11: qty 1

## 2012-01-11 MED ORDER — ACETAMINOPHEN 325 MG PO TABS
650.0000 mg | ORAL_TABLET | Freq: Once | ORAL | Status: AC
  Administered 2012-01-12: 650 mg via ORAL
  Filled 2012-01-11: qty 2

## 2012-01-11 MED ORDER — ALBUTEROL SULFATE (5 MG/ML) 0.5% IN NEBU
5.0000 mg | INHALATION_SOLUTION | Freq: Once | RESPIRATORY_TRACT | Status: AC
  Administered 2012-01-12: 5 mg via RESPIRATORY_TRACT
  Filled 2012-01-11: qty 1

## 2012-01-11 MED ORDER — PREDNISONE 50 MG PO TABS
60.0000 mg | ORAL_TABLET | Freq: Once | ORAL | Status: AC
  Administered 2012-01-12: 60 mg via ORAL
  Filled 2012-01-11: qty 1

## 2012-01-11 NOTE — ED Provider Notes (Signed)
History   This chart was scribed for Alexa Lower, MD, by Joline Maxcy. The patient was seen in room APA03/APA03 and the patient's care was started at 2332.    CSN: [id number]  Arrival date & time 01/11/12  2332   None     Chief Complaint  Patient presents with  . Fever    (Consider location/radiation/quality/duration/timing/severity/associated sxs/prior treatment) HPI Comments: [name] is a 46 y.o. female who presents to the Emergency Department complaining of a moderate, constant fever that began PTA. She reports associated headache, cough, and emesis. She denies any associated rashes. She has no recent travel, but has had several sick contacts within her family. She is allergic to metoprolol and sulfonamides.    Patient is a 46 y.o. female presenting with fever.  Fever Primary symptoms of the febrile illness include fever, cough and wheezing. Primary symptoms do not include abdominal pain, nausea, dysuria or rash.    Past Medical History  Diagnosis Date  . Breast cancer 2012    left  . ADD (attention deficit disorder with hyperactivity)   . Allergic rhinitis   . Asthma   . Hypertension   . Hyperlipidemia   . GERD (gastroesophageal reflux disease)   . Morbid obesity   . RLS (restless legs syndrome)   . OSA (obstructive sleep apnea)     Dr Gwenette Greet  . Depression   . Bowel obstruction 06-25-2010    post mastectomy  . Ovarian mass     resected 07/12/10    Past Surgical History  Procedure Date  . Mastectomy 06/21/2010    Left ; Dr [facility]  . Bilateral salpingoophorectomy 07/12/2010    enlarged ovary  . G 2 p 2     Family History  Problem Relation Age of Onset  . Adopted: Yes    History  Substance Use Topics  . Smoking status: Current Some Day Smoker -- 0.5 packs/day  . Smokeless tobacco: Never Used  . Alcohol Use: No    OB History    Grav Para Term Preterm Abortions TAB SAB Ect Mult Living                  Review of Systems    Constitutional: Positive for fever.  HENT: Positive for congestion.   Respiratory: Positive for cough and wheezing.   Cardiovascular: Negative for chest pain.  Gastrointestinal: Negative for nausea and abdominal pain.  Genitourinary: Negative for dysuria.  Skin: Negative for rash.  All other systems reviewed and are negative.    Allergies  Metoprolol and Sulfonamide derivatives  Home Medications   Current Outpatient Rx  Name Route Sig Dispense Refill  . CALCIUM CARBONATE 600 MG PO TABS Oral Take 600 mg by mouth daily.      Marland Kitchen CETIRIZINE HCL 10 MG PO TABS Oral Take 10 mg by mouth daily.      Marland Kitchen CITALOPRAM HYDROBROMIDE 20 MG PO TABS  TAKE 1 TABLET BY MOUTH EVERY DAY 90 tablet 1  . ACID CONTROLLER COMPLETE PO Oral Take by mouth. One daily     . GABAPENTIN 300 MG PO CAPS Oral Take 300 mg by mouth at bedtime.     . IBUPROFEN 400 MG PO TABS Oral Take 400 mg by mouth as needed.      Marland Kitchen ONE-DAILY MULTI VITAMINS PO TABS Oral Take 1 tablet by mouth daily.      Marland Kitchen PROAIR HFA 108 (90 BASE) MCG/ACT IN AERS  INHALE 2 PUFFS EVERY 6 HOURS AS NEEDED  8.5 each 5  . ROPINIROLE HCL 0.5 MG PO TABS Oral Take 1 tablet (0.5 mg total) by mouth daily. 30 tablet 5  . TRIAMTERENE-HCTZ 37.5-25 MG PO TABS  TAKE 1/2 TABLET BY MOUTH EVERY DAY 45 tablet 3  . VERAPAMIL HCL 120 MG PO TABS  TAKE 1/2 TABLET TWICE A DAY 30 tablet 5  . ALBUTEROL 90 MCG/ACT IN AERS Inhalation Inhale 2 puffs into the lungs every 6 (six) hours as needed. 17 g 0  . ANASTROZOLE 1 MG PO TABS  TAKE 1 TABLET BY MOUTH EVERY DAY 30 tablet 1    PLEASE ADVISE patient to call office for f/u  . NYSTATIN 100000 UNIT/GM EX POWD Topical Apply topically 4 (four) times daily. 15 g 0    BP 144/86  Pulse 112  Temp 99.9 F (37.7 C) (Oral)  Resp 20  SpO2 97%  LMP 07/14/2010  Physical Exam  Constitutional: She is oriented to person, place, and time. She appears well-developed and well-nourished.  HENT:  Head: Normocephalic and atraumatic.       Pt has  nasal congestion.  Eyes: Conjunctivae normal and EOM are normal. Pupils are equal, round, and reactive to light.  Neck: Neck supple.  Cardiovascular: Normal rate, regular rhythm and normal heart sounds.   Pulmonary/Chest: Effort normal. No respiratory distress. She has wheezes.        Bilateral expiratory wheezes.  Abdominal: Soft. Bowel sounds are normal. She exhibits no distension. There is no tenderness.  Musculoskeletal: Normal range of motion. She exhibits no edema and no tenderness.  Lymphadenopathy:    She has no cervical adenopathy.  Neurological: She is alert and oriented to person, place, and time.  Skin: Skin is warm and dry. No rash noted.    ED Course  Procedures (including critical care time)  DIAGNOSTIC STUDIES: Oxygen Saturation is 97% on room air, normal by my interpretation.    COORDINATION OF CARE:  23:50- Discussed planned course of treatment with the patient, including a breathing treatment, who is agreeable at this time.  00:00- Medication Orders- acetaminophen (TYLENOL) tablet 650 mg-Once, ibuprofen (ADVIL, MOTRIN) tablet 800 mg-Once, albuterol (PROVENTIL) (90m/ml) 0.5% nebulizer solution 5 mg- Once, predniSONE (DELTASONE) tablet 50 mg- Once.  Dg Chest Portable 1 View  01/12/2012  *RADIOLOGY REPORT*  Clinical Data: Fever and cough.  PORTABLE CHEST - 1 VIEW  Comparison: PA and lateral chest 06/16/2010.  Findings: Lungs clear.  Heart size normal.  No pneumothorax or effusion.  IMPRESSION: Negative chest.   Original Report Authenticated By: TOrlean Patten M.D.    Albuterol and steroids provided.   12:33 AM recheck - lung sounds much improved states she feel s a lot better and would like to go home. Plan albuterol inhaler to go Rx prednisone and cough medication for viral URI with wheezing   MDM  Cough, congestion and wheezing with fever suggests clinical URI, improved breathing with albuterol and steroids. Plan rx and close PCP follow up. CXR reviewed as  above.   I personally performed the services described in this documentation, which was scribed in my presence. The recorded information has been reviewed and considered.          BTeressa Lower MD 01/12/12 [phone]

## 2012-01-11 NOTE — ED Notes (Signed)
Pt reporting fever, cough & vomiting.

## 2012-01-12 ENCOUNTER — Emergency Department (HOSPITAL_COMMUNITY): Payer: Managed Care, Other (non HMO)

## 2012-01-12 MED ORDER — ALBUTEROL SULFATE HFA 108 (90 BASE) MCG/ACT IN AERS
2.0000 | INHALATION_SPRAY | RESPIRATORY_TRACT | Status: DC | PRN
  Administered 2012-01-12: 2 via RESPIRATORY_TRACT
  Filled 2012-01-12: qty 6.7

## 2012-01-12 MED ORDER — BENZONATATE 100 MG PO CAPS: 100.0000 mg | ORAL_CAPSULE | Freq: Three times a day (TID) | ORAL | Status: DC

## 2012-01-12 MED ORDER — PREDNISONE 20 MG PO TABS: 60.0000 mg | ORAL_TABLET | Freq: Every day | ORAL | Status: DC

## 2012-01-12 MED ORDER — SODIUM CHLORIDE 0.9 % IN NEBU
INHALATION_SOLUTION | RESPIRATORY_TRACT | Status: AC
  Administered 2012-01-12: 3 mL
  Filled 2012-01-12: qty 3

## 2012-01-12 NOTE — ED Notes (Signed)
Pt reporting decreased SOB at this time.  Discussed discharge instructions, no questions.

## 2012-01-16 ENCOUNTER — Other Ambulatory Visit: Payer: Self-pay | Admitting: Nurse Practitioner

## 2012-01-30 ENCOUNTER — Ambulatory Visit (HOSPITAL_BASED_OUTPATIENT_CLINIC_OR_DEPARTMENT_OTHER): Payer: Managed Care, Other (non HMO) | Admitting: Oncology

## 2012-01-30 ENCOUNTER — Telehealth: Payer: Self-pay | Admitting: *Deleted

## 2012-01-30 VITALS — BP 122/85 | HR 89 | Temp 98.4°F | Resp 20 | Ht 66.0 in | Wt 266.9 lb

## 2012-01-30 DIAGNOSIS — Z170 Estrogen receptor positive status [ER+]: Secondary | ICD-10-CM

## 2012-01-30 DIAGNOSIS — C50919 Malignant neoplasm of unspecified site of unspecified female breast: Secondary | ICD-10-CM

## 2012-01-30 DIAGNOSIS — C50219 Malignant neoplasm of upper-inner quadrant of unspecified female breast: Secondary | ICD-10-CM

## 2012-01-30 NOTE — Progress Notes (Signed)
Hematology and Oncology Follow Up Visit  [name] [id number] [birth year] 46 y.o. 01/30/2012 4:16 PM   Principle Diagnosis: 46 year old woman with history of T1 N0 ER/PR positive breast cancer with high recurrence score status post 4 cycles of TC chemotherapy, and mastectomy.  Current history of TAH/BSO for ovarian torsion and ovarian cyst, currently on Arimidex History  of multiple other comorbid problems  Interim History:  She is doing reasonably well. She is tolerating Arimidex. 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 She is not smoking .  Medications: I have reviewed the patient's current medications.  Allergies:  Allergies  Allergen Reactions  . Metoprolol     REACTION: " chest pain"  . Sulfonamide Derivatives     nausea    Past Medical History, Surgical history, Social history, and Family History were reviewed and updated.  Review of Systems: Constitutional:  Negative for fever, chills, night sweats, anorexia, weight loss, pain. Cardiovascular: no chest pain or dyspnea on exertion Respiratory: + cough, + shortness of breath, + wheezing Neurological: no TIA or stroke symptoms Dermatological: negative ENT: negative Skin Gastrointestinal: no abdominal pain, change in bowel habits, or black or bloody stools Genito-Urinary: no dysuria, trouble voiding, or hematuria Hematological and Lymphatic: negative Breast: negative for breast lumps Musculoskeletal: negative Remaining ROS negative.  Physical Exam: Blood pressure 122/85, pulse 89, temperature 98.4 F (36.9 C), temperature source Oral, resp. rate 20, height 5' 6"  (1.676 m), weight 266 lb 14.4 oz (121.065 kg), last menstrual period 07/14/2010. ECOG: 0 General appearance: alert, cooperative and appears stated age Head: Normocephalic, without obvious abnormality, atraumatic Neck: no adenopathy, no carotid bruit, no JVD, supple, symmetrical, trachea midline and  thyroid not enlarged, symmetric, no tenderness/mass/nodules Lymph nodes: Cervical, supraclavicular, and axillary nodes normal. Cardiac : Normal heart sounds Pulmonary: expiratory wheezes heard with decreased air entry in both lower lung field Breasts: Right breast is normal the left breast is status post mastectomy. There is no evidence of local recurrence. The axilla bilaterally are negative. The right breast is free of any masses, nipple retraction or skin changes Abdomen: Protuberant, with no masses or organomegaly  Extremities normal Neuro: Normal  Lab Results: Lab Results  Component Value Date   WBC 6.5 08/18/2011   HGB 13.1 08/18/2011   HCT 38.6 08/18/2011   MCV 93.3 08/18/2011   PLT 299.0 08/18/2011     Chemistry      Component Value Date/Time   NA 139 08/18/2011 1039   K 3.6 08/18/2011 1039   CL 106 08/18/2011 1039   CO2 29 08/18/2011 1039   BUN 15 08/18/2011 1039   CREATININE 0.5 08/18/2011 1039      Component Value Date/Time   CALCIUM 9.6 08/18/2011 1039   ALKPHOS 48 08/18/2011 1039   AST 35 08/18/2011 1039   ALT 55* 08/18/2011 1039   BILITOT 0.3 08/18/2011 1039       Radiological Studies: chest X-ray n/a  Mammogram pending Bone density n/a  Impression and Plan: Woman with history of breast cancer status post mastectomy and TAH/BSO, history of chronic smoking and persistent bronchitis. She has quiit smoking for about 1 month. I have encouraged exercise. F/u in 6 months. More than 50% of the visit was spent in patient-related counselling   Eston Esters, MD 11/19/20134:16 PM

## 2012-01-30 NOTE — Telephone Encounter (Signed)
Gave patient appointment for [phone]

## 2012-04-03 ENCOUNTER — Other Ambulatory Visit: Payer: Managed Care, Other (non HMO) | Admitting: Lab

## 2012-04-03 ENCOUNTER — Ambulatory Visit: Payer: Managed Care, Other (non HMO) | Admitting: Oncology

## 2012-05-07 ENCOUNTER — Other Ambulatory Visit (INDEPENDENT_AMBULATORY_CARE_PROVIDER_SITE_OTHER): Payer: Managed Care, Other (non HMO)

## 2012-05-07 DIAGNOSIS — R7309 Other abnormal glucose: Secondary | ICD-10-CM

## 2012-05-07 DIAGNOSIS — E8881 Metabolic syndrome: Secondary | ICD-10-CM

## 2012-05-07 DIAGNOSIS — E559 Vitamin D deficiency, unspecified: Secondary | ICD-10-CM

## 2012-05-07 LAB — BASIC METABOLIC PANEL
CO2: 28 mEq/L (ref 19–32)
Calcium: 9.4 mg/dL (ref 8.4–10.5)
Chloride: 103 mEq/L (ref 96–112)
Glucose, Bld: 145 mg/dL — ABNORMAL HIGH (ref 70–99)
Potassium: 3.8 mEq/L (ref 3.5–5.1)
Sodium: 139 mEq/L (ref 135–145)

## 2012-05-07 LAB — LIPID PANEL
HDL: 27.9 mg/dL — ABNORMAL LOW (ref >39.00)
Triglycerides: 239 mg/dL — ABNORMAL HIGH (ref 0.0–149.0)
VLDL: 47.8 mg/dL — ABNORMAL HIGH (ref 0.0–40.0)

## 2012-05-07 LAB — TSH: TSH: 1 u[IU]/mL (ref 0.35–5.50)

## 2012-05-07 LAB — HEMOGLOBIN A1C: Hgb A1c MFr Bld: 7.9 % — ABNORMAL HIGH (ref 4.6–6.5)

## 2012-05-07 LAB — HEPATIC FUNCTION PANEL
ALT: 174 U/L — ABNORMAL HIGH (ref 0–35)
Bilirubin, Direct: 0.1 mg/dL (ref 0.0–0.3)
Total Bilirubin: 0.6 mg/dL (ref 0.3–1.2)

## 2012-05-12 LAB — VITAMIN D 1,25 DIHYDROXY
Vitamin D 1, 25 (OH)2 Total: 49 pg/mL (ref 18–72)
Vitamin D2 1, 25 (OH)2: <8 pg/mL

## 2012-05-15 ENCOUNTER — Ambulatory Visit (INDEPENDENT_AMBULATORY_CARE_PROVIDER_SITE_OTHER): Payer: Managed Care, Other (non HMO) | Admitting: Family Medicine

## 2012-05-15 ENCOUNTER — Encounter: Payer: Self-pay | Admitting: Family Medicine

## 2012-05-15 VITALS — BP 140/96 | HR 80 | Temp 99.0°F | Ht 66.0 in | Wt 279.0 lb

## 2012-05-15 DIAGNOSIS — R109 Unspecified abdominal pain: Secondary | ICD-10-CM

## 2012-05-15 DIAGNOSIS — J4521 Mild intermittent asthma with (acute) exacerbation: Secondary | ICD-10-CM

## 2012-05-15 DIAGNOSIS — IMO0001 Reserved for inherently not codable concepts without codable children: Secondary | ICD-10-CM

## 2012-05-15 HISTORY — DX: Reserved for inherently not codable concepts without codable children: IMO0001

## 2012-05-15 LAB — POCT URINALYSIS DIPSTICK
Bilirubin, UA: NEGATIVE
Blood, UA: NEGATIVE
Glucose, UA: NEGATIVE
Spec Grav, UA: 1.025
Urobilinogen, UA: 0.2
pH, UA: 5.5

## 2012-05-15 MED ORDER — METFORMIN HCL 1000 MG PO TABS: 1000.0000 mg | ORAL_TABLET | Freq: Two times a day (BID) | ORAL | Status: DC

## 2012-05-15 MED ORDER — PREDNISONE 20 MG PO TABS: ORAL_TABLET | ORAL | Status: DC

## 2012-05-15 NOTE — Progress Notes (Signed)
OFFICE NOTE  05/15/2012  CC: No chief complaint on file.    HPI: Patient is a 47 y.o. Caucasian female who is a patient of Dr. Linna Darner at [facility] who is here for cramps in both sides.  "Charlie horse"-like cramping pains in both flanks, about 1-2 times per day x 1 week.  Less frequent occurrence of same sx in hamstrings and groin and jaw.  Most of the time it occurs if she moves a certain way but also can occur at rest.  Nothing has been found to help.  No fevers, no recent URI/viral syndrome.  No new meds coincident with sx's onset.  No change in caffeine intake.  Activity level/exercise has not changed recently. No rash.  In between the cramping she does not feel muscle pain except for maybe some soreness in flanks.   No abd pain, no dysuria, no hematuria, no frequency or urgency.  She has had a worsening cough, chest tightness/soft wheezing recurrent, using albut 1-2 times per day over the last month.  No fevers and no significant hay fever or URI sx's.  Pertinent PMH:  Past Medical History  Diagnosis Date  . Breast cancer 2012    left  . ADD (attention deficit disorder with hyperactivity)   . Allergic rhinitis   . Asthma   . Hypertension   . Hyperlipidemia   . GERD (gastroesophageal reflux disease)   . Morbid obesity   . RLS (restless legs syndrome)   . OSA (obstructive sleep apnea)     Dr Gwenette Greet  . Depression   . Bowel obstruction 06-25-2010    post mastectomy  . Ovarian mass     resected 07/12/10   MEDS:  Outpatient Prescriptions Prior to Visit  Medication Sig Dispense Refill  . calcium carbonate (OS-CAL) 600 MG TABS Take 600 mg by mouth daily.        . cetirizine (ZYRTEC) 10 MG tablet Take 10 mg by mouth daily.        . citalopram (CELEXA) 20 MG tablet TAKE 1 TABLET BY MOUTH EVERY DAY  90 tablet  1  . Famotidine-Ca Carb-Mag Hydrox (ACID CONTROLLER COMPLETE PO) Take by mouth. One daily       . gabapentin (NEURONTIN) 300 MG capsule TAKE ONE CAPSULE BY  MOUTH AT BEDTIME AS NEEDED  60 capsule  3  . ibuprofen (ADVIL,MOTRIN) 400 MG tablet Take 400 mg by mouth as needed.        . Multiple Vitamin (MULTIVITAMIN) tablet Take 1 tablet by mouth daily.        Marland Kitchen PROAIR HFA 108 (90 BASE) MCG/ACT inhaler INHALE 2 PUFFS EVERY 6 HOURS AS NEEDED  8.5 each  5  . rOPINIRole (REQUIP) 0.5 MG tablet Take 1 tablet (0.5 mg total) by mouth daily.  30 tablet  5  . triamterene-hydrochlorothiazide (MAXZIDE-25) 37.5-25 MG per tablet TAKE 1/2 TABLET BY MOUTH EVERY DAY  45 tablet  3  . verapamil (CALAN) 120 MG tablet TAKE 1/2 TABLET TWICE A DAY  30 tablet  5  . albuterol (PROVENTIL,VENTOLIN) 90 MCG/ACT inhaler Inhale 2 puffs into the lungs every 6 (six) hours as needed.  17 g  0   No facility-administered medications prior to visit.    PE: Blood pressure 140/96, pulse 80, temperature 99 F (37.2 C), temperature source Temporal, height 5' 6"  (1.676 m), weight 279 lb (126.554 kg), last menstrual period 07/14/2010. Gen: Alert, well appearing, obese white female in NAD.  Patient is oriented to person, place, time,  and situation. AFFECT: pleasant, lucid thought and speech. ENT: Eyes: no injection, icteris, swelling, or exudate.  EOMI, PERRLA. Nose: no drainage or turbinate edema/swelling.  No injection or focal lesion.  Mouth: lips without lesion/swelling.  Oral mucosa pink and moist.   Oropharynx without erythema, exudate, or swelling.  Neck - No masses or thyromegaly or limitation in range of motion CV: RRR, no m/r/g LUNGS: clear in inspiration.  Diffuse exp wheezing and mildly diminished aeration on exp.  Post-exhalation coughing noted. Nonlabored resps. ABD: soft, NT, ND, BS normal.   No HSM or mass or bruit. No tenderness in sides.  No CVA tenderness.  No TTP in any soft tissues of trunk or extremities. EXT: no clubbing, cyanosis, or edema.   RECENT LABS:     Chemistry      Component Value Date/Time   NA 139 05/07/2012 1007   K 3.8 05/07/2012 1007   CL 103  05/07/2012 1007   CO2 28 05/07/2012 1007   BUN 11 05/07/2012 1007   CREATININE 0.6 05/07/2012 1007      Component Value Date/Time   CALCIUM 9.4 05/07/2012 1007   ALKPHOS 53 05/07/2012 1007   AST 107* 05/07/2012 1007   ALT 174* 05/07/2012 1007   BILITOT 0.6 05/07/2012 1007     Lab Results  Component Value Date   HGBA1C 7.9* 05/07/2012     IMPRESSION AND PLAN:  Asthma, intermittent with acute exacerbation Prednisone 16m qd x 5d, then 231mqd x 5d.  Type II or unspecified type diabetes mellitus without mention of complication, uncontrolled Lab Results  Component Value Date   HGBA1C 7.9* 05/07/2012   Pt unaware of this dx. Did some minimal dietary teaching today, made nutritionist referral, nurse did glucometer teaching and gave her a glucometer with some strips. Check glucose bid for now.  Start Metformin 500 mg bid. F/u with Dr. HoLinna Darnern 1 wk.  I think her muscle cramp complaints stem from the combination of the two above problems.  An After Visit Summary was printed and given to the patient.  FOLLOW UP: 1 wk with Dr. HoLinna Darner

## 2012-05-15 NOTE — Patient Instructions (Signed)
Check fasting glucose daily and one other glucose 2 H after a meal and write these down for review with Dr. Linna Darner.

## 2012-05-16 NOTE — Assessment & Plan Note (Signed)
Prednisone 57m qd x 5d, then 248mqd x 5d.

## 2012-05-16 NOTE — Assessment & Plan Note (Signed)
Lab Results  Component Value Date   HGBA1C 7.9* 05/07/2012   Pt unaware of this dx. Did some minimal dietary teaching today, made nutritionist referral, nurse did glucometer teaching and gave her a glucometer with some strips. Check glucose bid for now.  Start Metformin 500 mg bid. F/u with Dr. Linna Darner in 1 wk.

## 2012-05-17 ENCOUNTER — Ambulatory Visit: Payer: Managed Care, Other (non HMO) | Admitting: Internal Medicine

## 2012-05-17 ENCOUNTER — Other Ambulatory Visit: Payer: Self-pay | Admitting: Oncology

## 2012-05-17 DIAGNOSIS — C50919 Malignant neoplasm of unspecified site of unspecified female breast: Secondary | ICD-10-CM

## 2012-05-17 LAB — URINE CULTURE: Organism ID, Bacteria: NO GROWTH

## 2012-05-20 ENCOUNTER — Telehealth: Payer: Self-pay | Admitting: Internal Medicine

## 2012-05-20 MED ORDER — FREESTYLE LANCETS MISC: Status: DC

## 2012-05-20 MED ORDER — GLUCOSE BLOOD VI STRP: ORAL_STRIP | Status: DC

## 2012-05-20 NOTE — Telephone Encounter (Signed)
RX sent

## 2012-05-20 NOTE — Telephone Encounter (Signed)
Patient is out of testing strips & lancets. Please send to [facility]. Patient mentioned they were to be sent Thursday 05/16/12 during OV so please check to see if Rx has already been sent.

## 2012-05-21 ENCOUNTER — Telehealth: Payer: Self-pay | Admitting: *Deleted

## 2012-05-21 NOTE — Telephone Encounter (Signed)
Faxed request for prior auth for FREESTYLE TEST STRIPS. Per [place], [place] code is [phone].  Pharmacy should use this number and they will receive a paid claim.   Ray at [facility] notified.  He ran insurance while on the phone with me and claim was paid.

## 2012-05-23 ENCOUNTER — Ambulatory Visit: Payer: Managed Care, Other (non HMO) | Admitting: Family Medicine

## 2012-05-27 ENCOUNTER — Encounter: Payer: Self-pay | Admitting: *Deleted

## 2012-06-01 ENCOUNTER — Telehealth: Payer: Self-pay | Admitting: *Deleted

## 2012-06-01 NOTE — Telephone Encounter (Signed)
Left message for pt to return my call so I can get her rescheduled.

## 2012-06-03 ENCOUNTER — Telehealth: Payer: Self-pay | Admitting: *Deleted

## 2012-06-03 NOTE — Telephone Encounter (Signed)
Pt left me a message to return her call and I called and left her a message to call me back.

## 2012-06-14 ENCOUNTER — Telehealth: Payer: Self-pay | Admitting: *Deleted

## 2012-06-14 ENCOUNTER — Encounter: Payer: Self-pay | Admitting: Oncology

## 2012-06-14 ENCOUNTER — Encounter: Payer: Self-pay | Admitting: Family Medicine

## 2012-06-14 ENCOUNTER — Ambulatory Visit (INDEPENDENT_AMBULATORY_CARE_PROVIDER_SITE_OTHER): Payer: Managed Care, Other (non HMO) | Admitting: Family Medicine

## 2012-06-14 VITALS — BP 144/90 | HR 93 | Ht 69.0 in | Wt 273.0 lb

## 2012-06-14 DIAGNOSIS — H538 Other visual disturbances: Secondary | ICD-10-CM

## 2012-06-14 DIAGNOSIS — J45901 Unspecified asthma with (acute) exacerbation: Secondary | ICD-10-CM

## 2012-06-14 DIAGNOSIS — G629 Polyneuropathy, unspecified: Secondary | ICD-10-CM

## 2012-06-14 DIAGNOSIS — E1149 Type 2 diabetes mellitus with other diabetic neurological complication: Secondary | ICD-10-CM

## 2012-06-14 DIAGNOSIS — E1142 Type 2 diabetes mellitus with diabetic polyneuropathy: Secondary | ICD-10-CM

## 2012-06-14 DIAGNOSIS — F988 Other specified behavioral and emotional disorders with onset usually occurring in childhood and adolescence: Secondary | ICD-10-CM

## 2012-06-14 DIAGNOSIS — J4521 Mild intermittent asthma with (acute) exacerbation: Secondary | ICD-10-CM

## 2012-06-14 DIAGNOSIS — G609 Hereditary and idiopathic neuropathy, unspecified: Secondary | ICD-10-CM

## 2012-06-14 MED ORDER — FLUTICASONE PROPIONATE HFA 220 MCG/ACT IN AERO: 1.0000 | INHALATION_SPRAY | Freq: Two times a day (BID) | RESPIRATORY_TRACT | Status: DC

## 2012-06-14 MED ORDER — DEXTROAMPHETAMINE SULFATE ER 10 MG PO CP24: 10.0000 mg | ORAL_CAPSULE | Freq: Every day | ORAL | Status: DC

## 2012-06-14 MED ORDER — BUDESONIDE 180 MCG/ACT IN AEPB: 1.0000 | INHALATION_SPRAY | Freq: Two times a day (BID) | RESPIRATORY_TRACT | Status: DC

## 2012-06-14 NOTE — Progress Notes (Signed)
OFFICE NOTE  06/14/2012  CC:  Chief Complaint  Patient presents with  . Follow-up    DM, bronchitis; also having "nerve spasms" in feet d/t diabetic neuropathy     HPI: Patient is a 47 y.o. Caucasian female who is here for 1 mo f/u asthma exac and new dx DM 2. Feeling better but still with some wheezing, but requiring albut rescue less and less as time goes on.  DM 2: fasting 95-100.  2H PP 130-150.  She has appt set up with nutritionist. Says she has chronic bothersome tingling and numbness in both feet (for years), worse after chemo tx for her breast cancer, occ gets "electric shock" type pains in both feet. Says vision has been worse/blurry lately, asks for referral to ophthalmologist.  Hx of adult ADD, has been off meds recently due to financial problems but wants to restart them now (dexedrine).   Pertinent PMH:  Past Medical History  Diagnosis Date  . Breast cancer 2012    left  . ADD (attention deficit disorder with hyperactivity)   . Allergic rhinitis   . Asthma   . Hypertension   . Hyperlipidemia   . GERD (gastroesophageal reflux disease)   . Morbid obesity   . RLS (restless legs syndrome)   . OSA (obstructive sleep apnea)     Dr Gwenette Greet  . Depression   . Bowel obstruction 06-25-2010    post mastectomy  . Ovarian mass     resected 07/12/10    MEDS:  Outpatient Prescriptions Prior to Visit  Medication Sig Dispense Refill  . anastrozole (ARIMIDEX) 1 MG tablet TAKE 1 TABLET BY MOUTH EVERY DAY  30 tablet  3  . calcium carbonate (OS-CAL) 600 MG TABS Take 600 mg by mouth daily.        . cetirizine (ZYRTEC) 10 MG tablet Take 10 mg by mouth daily.        . citalopram (CELEXA) 20 MG tablet TAKE 1 TABLET BY MOUTH EVERY DAY  90 tablet  1  . Famotidine-Ca Carb-Mag Hydrox (ACID CONTROLLER COMPLETE PO) Take by mouth. One daily       . glucose blood (FREESTYLE LITE) test strip Use as instructed to check blood sugar.  DX 250.02  100 each  5  . ibuprofen (ADVIL,MOTRIN) 400 MG  tablet Take 400 mg by mouth as needed.        . Lancets (FREESTYLE) lancets Use as directed to check blood sugar. DX 250.02  100 each  5  . metFORMIN (GLUCOPHAGE) 1000 MG tablet Take 1 tablet (1,000 mg total) by mouth 2 (two) times daily with a meal.  60 tablet  0  . Multiple Vitamin (MULTIVITAMIN) tablet Take 1 tablet by mouth daily.        Marland Kitchen PROAIR HFA 108 (90 BASE) MCG/ACT inhaler INHALE 2 PUFFS EVERY 6 HOURS AS NEEDED  8.5 each  5  . rOPINIRole (REQUIP) 0.5 MG tablet Take 1 tablet (0.5 mg total) by mouth daily.  30 tablet  5  . triamterene-hydrochlorothiazide (MAXZIDE-25) 37.5-25 MG per tablet TAKE 1/2 TABLET BY MOUTH EVERY DAY  45 tablet  3  . verapamil (CALAN) 120 MG tablet TAKE 1/2 TABLET TWICE A DAY  30 tablet  5  . gabapentin (NEURONTIN) 300 MG capsule TAKE ONE CAPSULE BY MOUTH AT BEDTIME AS NEEDED  60 capsule  3  . predniSONE (DELTASONE) 20 MG tablet 2 tabs po qd x 5d, then 1 tab po qd x 5d  15 tablet  0  No facility-administered medications prior to visit.    PE: Blood pressure 144/90, pulse 93, height 5' 9"  (1.753 m), weight 273 lb (123.832 kg), last menstrual period 07/14/2010. Gen: Alert, well appearing.  Patient is oriented to person, place, time, and situation. ENT: Ears: EACs clear, normal epithelium.  TMs with good light reflex and landmarks bilaterally.  Eyes: no injection, icteris, swelling, or exudate.  EOMI, PERRLA. Nose: no drainage or turbinate edema/swelling.  No injection or focal lesion.  Mouth: lips without lesion/swelling.  Oral mucosa pink and moist.  Dentition intact and without obvious caries or gingival swelling.  Oropharynx without erythema, exudate, or swelling.  Neck - No masses or thyromegaly or limitation in range of motion CV: RRR, no m/r/g.   LUNGS: Trace end-expiratory wheezing diffusely, nonlabored resps, good aeration in all lung fields. EXT: no clubbing, cyanosis, or edema.  Feet: no skin breakdown.  Color pink.  Monofilament testing revealed mild  decreased fine touch sensation in all toes.   IMPRESSION AND PLAN:  Asthma, intermittent with acute exacerbation Gradually resolving.   Will add inhaled steroid today: Flovent 220 mcg HFA, 1 puff bid.  Peripheral neuropathy Hands greater than feet. Due to to chemotherapy for breast cancer (Dr. Truddie Coco) plus uncertain contribution from DM 2. Patient will increase neurontin to 627m qhs and start 3020mqAM.  Add 30046mid-day dose in 1 week. Refer to neurologist.   Blurry vision, bilateral She reports that this came on after chemo. Referral to ophtho ordered.  Type II or unspecified type diabetes mellitus without mention of complication, uncontrolled Home monitoring pretty good. Go to nutritionist appt. Continue metformin. Urine microalb/cr was normal 08/2011. Recheck HbA1c at f/u in 2 mo.  ADD Restart dexedrine XR 17m32m qd.  I gave rx for this month and another for next month with appropriate fill on/after date on it.   An After Visit Summary was printed and given to the patient.   FOLLOW UP: 23mo61mo

## 2012-06-14 NOTE — Telephone Encounter (Signed)
Pt received my letter & I informed her that Dr. Truddie Coco was no longer here and we needed to get her reassigned with a new provider.  She was unaware and I answered all of her questions at this time.  I confirmed 08/02/12 appt w/ pt.  Mailed letter & calendar to pt.

## 2012-06-18 ENCOUNTER — Encounter: Payer: Self-pay | Admitting: Family Medicine

## 2012-06-18 DIAGNOSIS — H538 Other visual disturbances: Secondary | ICD-10-CM | POA: Insufficient documentation

## 2012-06-18 NOTE — Assessment & Plan Note (Signed)
She reports that this came on after chemo. Referral to ophtho ordered.

## 2012-06-18 NOTE — Assessment & Plan Note (Signed)
Restart dexedrine XR 36m, 1 qd.  I gave rx for this month and another for next month with appropriate fill on/after date on it.

## 2012-06-18 NOTE — Assessment & Plan Note (Signed)
Home monitoring pretty good. Go to nutritionist appt. Continue metformin. Urine microalb/cr was normal 08/2011. Recheck HbA1c at f/u in 2 mo.

## 2012-06-18 NOTE — Assessment & Plan Note (Signed)
Gradually resolving.   Will add inhaled steroid today: Flovent 220 mcg HFA, 1 puff bid.

## 2012-06-18 NOTE — Assessment & Plan Note (Signed)
Hands greater than feet. Due to to chemotherapy for breast cancer (Dr. Truddie Coco) plus uncertain contribution from DM 2. Patient will increase neurontin to 632m qhs and start 3049mqAM.  Add 30014mid-day dose in 1 week. Refer to neurologist.

## 2012-06-19 ENCOUNTER — Encounter: Payer: Self-pay | Admitting: Family Medicine

## 2012-06-20 ENCOUNTER — Other Ambulatory Visit: Payer: Self-pay | Admitting: *Deleted

## 2012-06-20 MED ORDER — METFORMIN HCL 1000 MG PO TABS: 1000.0000 mg | ORAL_TABLET | Freq: Two times a day (BID) | ORAL | Status: DC

## 2012-06-20 NOTE — Telephone Encounter (Signed)
Faxed refill request received from pharmacy for [place] filled by MD on 05/15/12 #60 X 0 Last seen on 06/14/12 Follow up 2 MONTHS RX SENT.

## 2012-06-24 ENCOUNTER — Ambulatory Visit (INDEPENDENT_AMBULATORY_CARE_PROVIDER_SITE_OTHER): Payer: Managed Care, Other (non HMO) | Admitting: Family Medicine

## 2012-06-24 ENCOUNTER — Encounter: Payer: Self-pay | Admitting: Family Medicine

## 2012-06-24 VITALS — Ht 65.0 in | Wt 275.0 lb

## 2012-06-24 DIAGNOSIS — E0842 Diabetes mellitus due to underlying condition with diabetic polyneuropathy: Secondary | ICD-10-CM

## 2012-06-24 NOTE — Patient Instructions (Addendum)
-   Go to Self-compassion.org, and take the quiz.  Bring in your scores to discuss at next appt.   - Self-talk:  Would you say that to someone else?  - Keep a record of the # of times you catch yourself with self-criticism.    - Keep a success journal:  What you did well today, or were pleased with, or felt good about.   - GOALS:    1. Some kind of exercise at least 20 min 3 X wk.     - PLAN YOUR EXERCISE IN ADVANCE, and RECORD # of MINUTES YOU DO EXERCISE.   2. No sweet beverages.    3. Record daily fasting BG along with exercise minutes.   - Watch this video:  Jess Barters The Truth about Exercise:  [email or web address].[email or web address]/85277824  - Keep notes, looking for at least 5 main points he's making.  - [employer]'s BELT program:  [place]: [phone].  Questions:  Contact Dr. Jenne Campus at [phone] / [email or web address].

## 2012-06-24 NOTE — Progress Notes (Signed)
Medical Nutrition Therapy:  Appt start time: 1030 end time:  1130.  Assessment:  Primary concerns today: Weight management and Blood sugar control.  [name] was accompanied by her mother [name] today, who came to help remember what was said Hoar has ADD, and appreciates the back-up).  [name] sounded defeated and self-critical when she told me she has never had the willpower to lose weight.  She has done much therapy to recover from the sense of rejection she felt from being put up for adoption by her biological mother.  She was frequently teased as a child, and was very shy and timid.  She said she has great parents, a nice family (two kids), home, etc., but is still unhappy with herself that she cannot seem to do what she needs to do to lose weight and get her health under control.   <oira quit smoking about a month ago.  She was dismissal of the accomplishment b/c she has quit many times before.    Usual eating pattern includes 3 meals and 0 snacks per day. Usual physical activity includes working on her feet at [employer] (~30-hr week).  She started walking last week, but ankle has been hurting recently.  Has a stationary bike at home. FBG have been running 95-115.  2-hr post-prandial have been 130-150.    24-hr recall: (Up at 9:30 AM) B ( AM)-   none Snk ( AM)-   none L (1:30 PM)-  3 c arroz com pollo w/ cheese, ~2 c chips, salsa, 32 oz sweet tea Snk ( PM)-  none D (6:30 PM)-  [employer], medium ff's, unsweet tea Snk ( PM)-  none  Progress Towards Goal(s):  In progress.   Nutritional Diagnosis:  NB-2.1 Physical inactivity As related to foot and lower-body extremity pain.  As evidenced by no regular exercise. NI-1.5 Excessive energy intake As related to expenditure.  As evidenced by BMI >45.    Intervention:  Nutrition counseling.  Monitoring/Evaluation:  Dietary intake, exercise, BG, and body weight in 2 week(s).

## 2012-07-01 ENCOUNTER — Encounter: Payer: Self-pay | Admitting: Neurology

## 2012-07-02 ENCOUNTER — Ambulatory Visit: Payer: Managed Care, Other (non HMO) | Admitting: Neurology

## 2012-07-08 ENCOUNTER — Other Ambulatory Visit: Payer: Self-pay | Admitting: *Deleted

## 2012-07-08 MED ORDER — GABAPENTIN 300 MG PO CAPS: ORAL_CAPSULE | ORAL | Status: DC

## 2012-07-08 NOTE — Telephone Encounter (Signed)
Per 04.04.14 OV note, patient is requesting new Rx Gabapentin: Peripheral neuropathy - Tammi Sou, MD at 06/18/2012 1:04 PM Hands greater than feet. Due to to chemotherapy for breast cancer (Dr. Truddie Coco) plus uncertain contribution from DM 2.  Patient will increase neurontin to 679m qhs and start 3084mqAM. Add 30032mid-day dose in 1 week.  Refer to neurologist.  30-day supply w/new instructions to pharmacy; [facility] RE: Rx request/SLS

## 2012-07-11 ENCOUNTER — Ambulatory Visit: Payer: Managed Care, Other (non HMO) | Admitting: Family Medicine

## 2012-07-17 ENCOUNTER — Other Ambulatory Visit: Payer: Self-pay | Admitting: *Deleted

## 2012-07-17 MED ORDER — METFORMIN HCL 1000 MG PO TABS: 1000.0000 mg | ORAL_TABLET | Freq: Two times a day (BID) | ORAL | Status: DC

## 2012-07-17 NOTE — Telephone Encounter (Signed)
Rx sent in to pharmacy. 

## 2012-07-19 ENCOUNTER — Telehealth: Payer: Self-pay | Admitting: Family Medicine

## 2012-07-19 NOTE — Telephone Encounter (Signed)
Dextroamphetamine (DEXEDRINE) request [Last Rx 04.04.14 x2-April & May Rx]/SLS Called patient to inquire on May Rx given at 04.04.14 OV; pt "forgot she was given 2nd script"/SLS

## 2012-07-29 ENCOUNTER — Telehealth: Payer: Self-pay | Admitting: Family Medicine

## 2012-07-29 NOTE — Telephone Encounter (Signed)
Patient has lost her glucose meter. She has the strips, can she buy one from [place]? Please advise.

## 2012-07-29 NOTE — Telephone Encounter (Signed)
Called pt to inform that we have Freestyle meter she can p/u at office; was informed that she "had already went out & bought one"/SLS

## 2012-08-01 ENCOUNTER — Other Ambulatory Visit: Payer: Managed Care, Other (non HMO) | Admitting: Lab

## 2012-08-01 ENCOUNTER — Ambulatory Visit: Payer: Managed Care, Other (non HMO) | Admitting: Oncology

## 2012-08-02 ENCOUNTER — Encounter: Payer: Self-pay | Admitting: Oncology

## 2012-08-02 ENCOUNTER — Ambulatory Visit (HOSPITAL_BASED_OUTPATIENT_CLINIC_OR_DEPARTMENT_OTHER): Payer: Managed Care, Other (non HMO) | Admitting: Oncology

## 2012-08-02 ENCOUNTER — Telehealth: Payer: Self-pay | Admitting: *Deleted

## 2012-08-02 VITALS — BP 112/77 | HR 96 | Temp 98.5°F | Resp 20 | Ht 65.0 in | Wt 268.2 lb

## 2012-08-02 DIAGNOSIS — C50419 Malignant neoplasm of upper-outer quadrant of unspecified female breast: Secondary | ICD-10-CM

## 2012-08-02 DIAGNOSIS — C50919 Malignant neoplasm of unspecified site of unspecified female breast: Secondary | ICD-10-CM

## 2012-08-02 DIAGNOSIS — Z853 Personal history of malignant neoplasm of breast: Secondary | ICD-10-CM

## 2012-08-02 MED ORDER — ANASTROZOLE 1 MG PO TABS: 1.0000 mg | ORAL_TABLET | Freq: Every day | ORAL | Status: DC

## 2012-08-02 NOTE — Patient Instructions (Addendum)
#  1 continue Arimidex 1 mg daily as you are.  #2 I would recommend that you go see a neurologist for your neuropathy due to diabetes.  #3 I have referred you for a mammogram and a bone density scan.  #4 I will see you back in 6 months time for followup.

## 2012-08-02 NOTE — Telephone Encounter (Signed)
appts made and printed...td 

## 2012-08-20 ENCOUNTER — Encounter: Payer: Managed Care, Other (non HMO) | Admitting: Internal Medicine

## 2012-08-20 DIAGNOSIS — Z0289 Encounter for other administrative examinations: Secondary | ICD-10-CM

## 2012-08-21 ENCOUNTER — Telehealth: Payer: Self-pay | Admitting: *Deleted

## 2012-08-21 NOTE — Telephone Encounter (Signed)
Message copied by Marylen Ponto on Wed Aug 21, 2012 10:07 AM ------      Message from: Hendricks Limes      Created: Tue Aug 20, 2012  9:57 AM       It appears she is seeing Dr [name]; he just refilled her ADD drug.Please verify so primary can be changed.       Are these physicals being called prior to the appointment?Thanks, Hopp ------

## 2012-08-21 NOTE — Telephone Encounter (Signed)
Left message to call office to see if Pt has changed PCP.

## 2012-08-22 ENCOUNTER — Telehealth: Payer: Self-pay | Admitting: *Deleted

## 2012-08-22 NOTE — Telephone Encounter (Signed)
Left message to call office

## 2012-08-22 NOTE — Telephone Encounter (Signed)
Attempted to contact pt to verify if she wanted Dr. [name] to be her PCP as she has been seeing him recently. Left message on voice mail to return my call.

## 2012-08-27 ENCOUNTER — Other Ambulatory Visit: Payer: Self-pay | Admitting: Family Medicine

## 2012-08-27 NOTE — Telephone Encounter (Signed)
Refill request for gabapentin Last filled by MD on - 07/08/12 #120 x0 Last seen- 06/14/12 F/U- none Please advise refill?

## 2012-08-28 ENCOUNTER — Other Ambulatory Visit: Payer: Self-pay | Admitting: Family Medicine

## 2012-08-28 MED ORDER — GABAPENTIN 300 MG PO CAPS: ORAL_CAPSULE | ORAL | Status: DC

## 2012-08-28 NOTE — Telephone Encounter (Signed)
eRx sent

## 2012-08-28 NOTE — Telephone Encounter (Signed)
Left message on voicemail for patient to call and confirm PCP

## 2012-08-28 NOTE — Telephone Encounter (Signed)
Patients last OV with you was 06/14/12 next OV scheduled 02/10/13.  Please advise refill.

## 2012-08-29 NOTE — Progress Notes (Signed)
OFFICE PROGRESS NOTE  CC  [name],[name], RD 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 She had a biopsy performed that showed invasive ductal carcinoma with DCIS. Tumor was ER positive PR negative HER-2/neu negative. MRI of the breasts on 05/29/2010 showed 8.3 x 5.0 x 3.6 cm lesion. An enlarged left axillary lymph node was also noted. This was biopsied and was benign.  #2 patient subsequently underwent a mastectomy on 06/21/2010 the final pathology showed a very large focus of DCIS measuring 6.5 x 4.0 x 3.0 cm. In the upper inner quadrant there were 2 foci of invasive ductal carcinoma with angiolymphatic invasion. They were considered to be grade 1 one measures your 0.9 and the other one measure 0.8 cm. Margins were all negative. The tumor was ER +78% and 83%. PR negative at 0%. Proliferation marker Ki-67 was 25% and HER-2/neu negative.  #3 patient had Oncotype DX testing performed her breast cancer recurrence score was 23 giving her a 15% risk of recurrence distally at 5 years. This was in the intermediate risk. Patient was therefore recommended adjuvant chemotherapy consisting of 4 cycles of Taxotere and Cytoxan. She completed this in August 2012.  #4 patient also had total abdominal hysterectomy with bilateral salpingo-oophorectomy for ovarian torsion and ovarian cyst.  #5 she was begun on Arimidex 1 mg daily in August 2012. She remains on this and is tolerating it well. Total of 5-7 years of therapy is planned.  CURRENT THERAPY: Arimidex 1 mg daily  INTERVAL HISTORY: [name] 47 y.o. female returns for followup visit to establish her care with me. Prior to that she was being seen by Dr. Eston Esters. He last saw her on 01/30/2012. Clinically patient is doing well without any  significant problems. She denies any fevers chills night sweats headaches shortness of breath chest pains palpitations she does have some aches and pains. This is most likely noted because of the Arimidex. Patient does continue to smoke. Remainder of the 10 point review of systems is negative.  MEDICAL HISTORY: Past Medical History  Diagnosis Date  . Breast cancer 2012    left  . ADD (attention deficit disorder with hyperactivity)   . Allergic rhinitis   . Asthma   . Hypertension   . Hyperlipidemia   . GERD (gastroesophageal reflux disease)   . Morbid obesity   . RLS (restless legs syndrome)   . OSA (obstructive sleep apnea)     Dr Gwenette Greet  . Depression   . Bowel obstruction 06-25-2010    post mastectomy  . Ovarian mass     resected 07/12/10  . Type II or unspecified type diabetes mellitus without mention of complication, uncontrolled 05/15/2012    ALLERGIES:  is allergic to metoprolol and sulfonamide derivatives.  MEDICATIONS:  Current Outpatient Prescriptions  Medication Sig Dispense Refill  . anastrozole (ARIMIDEX) 1 MG tablet Take 1 tablet (1 mg total) by mouth daily.  30 tablet  12  . cetirizine (ZYRTEC) 10 MG tablet Take 10 mg by mouth daily.        . cholecalciferol (VITAMIN D) 1000 UNITS tablet Take 1,000 Units by mouth daily.      . citalopram (CELEXA) 20 MG tablet TAKE 1 TABLET BY MOUTH EVERY DAY  90 tablet  1  . dextroamphetamine (DEXEDRINE SPANSULE) 10 MG 24 hr  capsule Take 1 capsule (10 mg total) by mouth daily.  30 capsule  0  . Famotidine-Ca Carb-Mag Hydrox (ACID CONTROLLER COMPLETE PO) Take by mouth. One daily       . fluticasone (FLOVENT HFA) 220 MCG/ACT inhaler Inhale 1 puff into the lungs 2 (two) times daily.  1 Inhaler  6  . glucose blood (FREESTYLE LITE) test strip Use as instructed to check blood sugar.  DX 250.02  100 each  5  . ibuprofen (ADVIL,MOTRIN) 400 MG tablet Take 400 mg by mouth as needed.        . Lancets (FREESTYLE) lancets Use as directed to check  blood sugar. DX 250.02  100 each  5  . metFORMIN (GLUCOPHAGE) 1000 MG tablet Take 1 tablet (1,000 mg total) by mouth 2 (two) times daily with a meal.  60 tablet  5  . Multiple Vitamin (MULTIVITAMIN) tablet Take 1 tablet by mouth daily.        Marland Kitchen rOPINIRole (REQUIP) 0.5 MG tablet Take 1 tablet (0.5 mg total) by mouth daily.  30 tablet  5  . triamterene-hydrochlorothiazide (MAXZIDE-25) 37.5-25 MG per tablet TAKE 1/2 TABLET BY MOUTH EVERY DAY  45 tablet  3  . verapamil (CALAN) 120 MG tablet TAKE 1/2 TABLET TWICE A DAY  30 tablet  5  . calcium carbonate (OS-CAL) 600 MG TABS Take 600 mg by mouth daily.        Marland Kitchen gabapentin (NEURONTIN) 300 MG capsule 1 cap po qAM, 1 cap po at mid day, and 2 caps po qhs  120 capsule  5  . PROAIR HFA 108 (90 BASE) MCG/ACT inhaler INHALE 2 PUFFS EVERY 6 HOURS AS NEEDED  8.5 each  5   No current facility-administered medications for this visit.    SURGICAL HISTORY:  Past Surgical History  Procedure Laterality Date  . Mastectomy  06/21/2010    Left ; [facility]  . Bilateral salpingoophorectomy  07/12/2010    enlarged ovary  . G 2 p 2      REVIEW OF SYSTEMS:  Pertinent items are noted in HPI.   HEALTH MAINTENANCE:  PHYSICAL EXAMINATION: Blood pressure 112/77, pulse 96, temperature 98.5 F (36.9 C), temperature source Oral, resp. rate 20, height 5' 5"  (1.651 m), weight 268 lb 3.2 oz (121.655 kg), last menstrual period 07/14/2010. Body mass index is 44.63 kg/(m^2). ECOG PERFORMANCE STATUS: 0 - Asymptomatic  Patient is awake alert in no acute distress well appearing female HEENT exam EOMI PERRLA sclerae anicteric no conjunctival pallor oral mucosa is moist neck is supple lungs are clear to auscultation and percussion cardiovascular is regular rate rhythm abdomen is soft nontender no HSM extremities no edema neuro nonfocal right breast no masses nipple discharge left mastectomy scar looks well healed no evidence of local recurrence.   LABORATORY  DATA: Lab Results  Component Value Date   WBC 6.5 08/18/2011   HGB 13.1 08/18/2011   HCT 38.6 08/18/2011   MCV 93.3 08/18/2011   PLT 299.0 08/18/2011      Chemistry      Component Value Date/Time   NA 139 05/07/2012 1007   K 3.8 05/07/2012 1007   CL 103 05/07/2012 1007   CO2 28 05/07/2012 1007   BUN 11 05/07/2012 1007   CREATININE 0.6 05/07/2012 1007      Component Value Date/Time   CALCIUM 9.4 05/07/2012 1007   ALKPHOS 53 05/07/2012 1007   AST 107* 05/07/2012 1007   ALT 174* 05/07/2012 1007   BILITOT  0.6 05/07/2012 1007     FINAL DIAGNOSIS Diagnosis 1. Lymph node, sentinel, biopsy, left axillary - ONE LYMPH NODE, NEGATIVE FOR METASTATIC CARCINOMA (0/1). 2. Lymph node, sentinel, biopsy, left axillary - ONE LYMPH NODE, NEGATIVE FOR METASTATIC CARCINOMA (0/1). 3. Lymph node, sentinel, biopsy, left axillary - ONE LYMPH NODE, NEGATIVE FOR METASTATIC CARCINOMA (0/1). 4. Breast, simple mastectomy, left - TWO FOCI OF INVASIVE DUCTAL CARCINOMA (0.9 CM AND 0.8 CM), NOTTINGHAM COMBINED HISTOLOGIC GRADE I. - EXTENSIVE HIGH GRADE DUCTAL CARCINOMA IN SITU. - ANGIOLYMPHATIC INVASION PRESENT. - RESECTION MARGINS CLEAR. - SEE ONCOLOGY TEMPLATE FOR DETAIL. 5. Skin , superior flap - SKIN AND SUBCUTANEOUS TISSUE, NEGATIVE FOR ATYPIA OR MALIGNANCY. 6. Skin , inferior flap left mastectomy - SKIN AND SUBCUTANEOUS TISSUE, NEGATIVE FOR MALIGNANCY. Microscopic Comment 4. BREAST, WITH LYMPH NODE SAMPLING Specimen, including laterality: Left breast Procedure: Simple mastectomy Tumor size of largest invasive carcinoma, two foci, 0.9 cm and 0.8 cm, glass slide measurement. Margins: Invasive component, distance to closest margin: deep margin: at least 1 cm In situ component, distance to closest margin: deep margin: at least 1 cm Lymph - Vascular invasion: Present Histologic type, invasive component: Invasive ductal carcinoma Grade, invasive component (Nottingham combined histologic score): I Tubule formation  grade: 1 Nuclear pleomorphism grade: 2 Mitotic grade: 1 1 of 5 FINAL for SRI, CLEGG ([id number]) Microscopic Comment(continued) Ductal carcinoma in situ: Present Nuclear grade: High grade Necrosis: Present Extensive intraductal component: Present (more than 80%). Lobular neoplasia present: N/A Treatment effect (if treated with neoadjuvant therapy): No Multicentric (separate tumors in different quadrants): No Multifocal (separate tumors in same quadrant or biopsy): No Macroscopic or microscopic extent of tumor: Skin: No Nipple: No Skeletal muscle: N/A Axillary lymph nodes: Number examined: 3 Number with metastasis: 0 ITC (isolated tumor cells, < 0.20m): 0 Micrometastasis (> 0.273m < 35m73m 0 Metastasis > 2 mm: 0 Extracapsular extension: 0 Method of detection of metastases: H&E, cytokeratin or both: H&E only TNM Code: m pT1b, pN0 (sn-) Breast Prognostic Markers: A breast prognostic profile will be performed on Block 4D and Her3 neu will be repeated on Block 4H. Previous case [id number]: Estrogen receptor: 78%, positive, moderate staining density Progesterone receptor: 0%, negative Ki 67 (Mib-1): 25% Her 2 neu by CISH: No amplification of Her 2 neu detected. The ratio of Her2: CEP 17 lymph nodes was 1.5. Non-neoplastic breast: Extensive stromal fibrosis and previous biopsy site changes. Comments: Grossly, there is a 6.5 x 4 x 3 cm ill-defined mass identified in the upper inner quadrant of the specimen. Sections of the tumor reveal extensive high grade DCIS and two microscopic foci of invasive ductal carcinoma with slightly different morphologies. Angiolymphatic invasion is present. In addition, the random sections taken from upper outer quadrant reveal abundant single cells infiltrating in the dense stroma. Immunohistochemical stain was performed using antibodies directed against cytokeratin AE1/AE3 on this random section from UOQ and the stain shows no evidence of  carcinoma in this area. (HCL:jy)  RADIOGRAPHIC STUDIES:  No results found.  ASSESSMENT: 47 66ar old female with  #1 history of T1 N0 (stage I) invasive ductal carcinoma of the left breast with extensive ductal carcinoma in situ. Patient is status post mastectomy with sentinel lymph node biopsy on 06/21/2010. She had 2 small foci of invasive cancer. She did have Oncotype DX testing performed which showed a recurrence score of 23 putting her in the intermediate risk category with five-year distant risk of recurrence at 15%. She went on to receive adjuvant chemotherapy consisting of Taxotere and  Cytoxan given every 3 weeks for a total of 4 cycles. She completed this in August 2012. Thereafter patient was begun on adjuvant antiestrogen therapy with Arimidex 1 mg daily. She remains on this and she is planning on being on this for about 5-7 years. She has no evidence of local recurrence.  #2 patient did have genetic testing performed for BRCA1 and BRCA2 gene mutation and she was found to be negative.  #3 patient did undergo bilateral salpingo-oophorectomy with hysterectomy to 2 ovarian torsion.   PLAN:   #1 patient will continue on Arimidex 1 mg daily. Risks and benefits of this were discussed with the patient.   #2 We also discussed getting bone densities scans on her to follow her bone health. Bone density scan was ordered  #3 patient does need mammogram and we did order this for her.  #4 I will continue to see her every 6 months.   All questions were answered. The patient knows to call the clinic with any problems, questions or concerns. We can certainly see the patient much sooner if necessary.  I spent 30 minutes counseling the patient face to face. The total time spent in the appointment was 30 minutes.    Marcy Panning, MD Medical/Oncology [facility] [phone] (beeper) [phone] (Office)  08/29/2012, 4:46 PM

## 2012-08-29 NOTE — Telephone Encounter (Signed)
Per message from [place] Pt PCP is Alease Medina. Will close encounter and mail letter after several attempt to contact Pt. Letter mailed for Pt to contact office and confirm change of PCP

## 2012-09-03 ENCOUNTER — Ambulatory Visit: Payer: Managed Care, Other (non HMO)

## 2012-09-03 ENCOUNTER — Other Ambulatory Visit: Payer: Managed Care, Other (non HMO)

## 2012-09-10 ENCOUNTER — Telehealth: Payer: Self-pay | Admitting: Family Medicine

## 2012-09-10 NOTE — Telephone Encounter (Signed)
Spoke to pt's pharmacy because our system showed she should have medication with 5 refills as of 08/28/12.  Pharm tech said that they had medication ready for pick up.  LMOM for pt to go ahead and pick up medication at pharmacy.

## 2012-09-12 ENCOUNTER — Telehealth: Payer: Self-pay | Admitting: Family Medicine

## 2012-09-12 DIAGNOSIS — H538 Other visual disturbances: Secondary | ICD-10-CM

## 2012-09-12 DIAGNOSIS — E1142 Type 2 diabetes mellitus with diabetic polyneuropathy: Secondary | ICD-10-CM

## 2012-09-12 NOTE — Telephone Encounter (Signed)
Dexedrine 41m last filled 06/14/12 Last OV 06/24/12 Patient did not follow up.   Please advise refill.

## 2012-09-16 ENCOUNTER — Telehealth: Payer: Self-pay | Admitting: Emergency Medicine

## 2012-09-16 MED ORDER — DEXTROAMPHETAMINE SULFATE ER 10 MG PO CP24: 10.0000 mg | ORAL_CAPSULE | Freq: Every day | ORAL | Status: DC

## 2012-09-16 NOTE — Telephone Encounter (Signed)
Rx for 30 d supply printed. Pt must have office f/u for adult ADD and her Hypertension and Diabetes prior to further rx's for this med.--thx

## 2012-09-16 NOTE — Telephone Encounter (Signed)
Faxed Rx to [facility].  Left detailed message for patient to pick up medication and make an appointment for further refills.

## 2012-09-16 NOTE — Telephone Encounter (Signed)
lmom for pt to pick up rx at front desk.

## 2012-09-16 NOTE — Telephone Encounter (Signed)
[facility] called and said they received a fax for Dexedrine. He states this can not be faxed but picked up at the office by the patient.

## 2012-09-19 ENCOUNTER — Other Ambulatory Visit: Payer: Self-pay

## 2012-09-26 ENCOUNTER — Ambulatory Visit
Admission: RE | Admit: 2012-09-26 | Discharge: 2012-09-26 | Disposition: A | Discharge status: Home / Self Care | Payer: Managed Care, Other (non HMO) | Source: Ambulatory Visit | Attending: Oncology | Admitting: Oncology

## 2012-09-26 DIAGNOSIS — Z853 Personal history of malignant neoplasm of breast: Secondary | ICD-10-CM

## 2012-10-16 ENCOUNTER — Other Ambulatory Visit: Payer: Self-pay

## 2012-10-16 MED ORDER — VERAPAMIL HCL 120 MG PO TABS: ORAL_TABLET | ORAL | Status: DC

## 2012-10-16 MED ORDER — TRIAMTERENE-HCTZ 37.5-25 MG PO TABS: ORAL_TABLET | ORAL | Status: DC

## 2012-10-16 MED ORDER — CITALOPRAM HYDROBROMIDE 20 MG PO TABS: ORAL_TABLET | ORAL | Status: DC

## 2012-10-16 NOTE — Telephone Encounter (Signed)
RFs done for bp med and antidepressant today but pt requires routine diabetic/HTN/med check f/u prior to further RFs.-thx

## 2012-10-16 NOTE — Telephone Encounter (Signed)
Patient was last seen with Hop on 08/18/11. Please advise if refills appropriate.      KP

## 2012-10-23 ENCOUNTER — Other Ambulatory Visit: Payer: Self-pay | Admitting: Obstetrics and Gynecology

## 2012-11-07 ENCOUNTER — Other Ambulatory Visit: Payer: Self-pay | Admitting: Family Medicine

## 2012-11-07 NOTE — Telephone Encounter (Signed)
Patient pharmacy requesting refill for requip.  I denied medication because patient hasn't been in office since 05/15/12 and did not return for two month follow up.

## 2012-11-21 ENCOUNTER — Other Ambulatory Visit: Payer: Self-pay | Admitting: Family Medicine

## 2012-11-21 ENCOUNTER — Other Ambulatory Visit: Payer: Self-pay | Admitting: Oncology

## 2012-11-21 DIAGNOSIS — C50919 Malignant neoplasm of unspecified site of unspecified female breast: Secondary | ICD-10-CM

## 2012-11-21 DIAGNOSIS — Z853 Personal history of malignant neoplasm of breast: Secondary | ICD-10-CM

## 2012-11-21 MED ORDER — ROPINIROLE HCL 0.5 MG PO TABS: 0.5000 mg | ORAL_TABLET | Freq: Every day | ORAL | Status: DC

## 2012-12-04 ENCOUNTER — Other Ambulatory Visit: Payer: Self-pay | Admitting: Family Medicine

## 2012-12-04 MED ORDER — VERAPAMIL HCL 120 MG PO TABS: ORAL_TABLET | ORAL | Status: DC

## 2012-12-10 ENCOUNTER — Encounter: Payer: Self-pay | Admitting: Family Medicine

## 2012-12-10 ENCOUNTER — Ambulatory Visit (INDEPENDENT_AMBULATORY_CARE_PROVIDER_SITE_OTHER): Payer: Managed Care, Other (non HMO) | Admitting: Family Medicine

## 2012-12-10 VITALS — BP 140/84 | HR 87 | Temp 98.8°F | Resp 16 | Ht 69.0 in | Wt 265.0 lb

## 2012-12-10 DIAGNOSIS — K7689 Other specified diseases of liver: Secondary | ICD-10-CM

## 2012-12-10 DIAGNOSIS — G609 Hereditary and idiopathic neuropathy, unspecified: Secondary | ICD-10-CM

## 2012-12-10 DIAGNOSIS — H538 Other visual disturbances: Secondary | ICD-10-CM

## 2012-12-10 DIAGNOSIS — G629 Polyneuropathy, unspecified: Secondary | ICD-10-CM

## 2012-12-10 DIAGNOSIS — E785 Hyperlipidemia, unspecified: Secondary | ICD-10-CM

## 2012-12-10 DIAGNOSIS — E1149 Type 2 diabetes mellitus with other diabetic neurological complication: Secondary | ICD-10-CM

## 2012-12-10 DIAGNOSIS — Z23 Encounter for immunization: Secondary | ICD-10-CM

## 2012-12-10 DIAGNOSIS — J45909 Unspecified asthma, uncomplicated: Secondary | ICD-10-CM

## 2012-12-10 DIAGNOSIS — K760 Fatty (change of) liver, not elsewhere classified: Secondary | ICD-10-CM

## 2012-12-10 DIAGNOSIS — R87619 Unspecified abnormal cytological findings in specimens from cervix uteri: Secondary | ICD-10-CM

## 2012-12-10 DIAGNOSIS — J4530 Mild persistent asthma, uncomplicated: Secondary | ICD-10-CM

## 2012-12-10 LAB — COMPREHENSIVE METABOLIC PANEL
ALT: 114 U/L — ABNORMAL HIGH (ref 0–35)
AST: 47 U/L — ABNORMAL HIGH (ref 0–37)
Albumin: 3.7 g/dL (ref 3.5–5.2)
Alkaline Phosphatase: 36 U/L — ABNORMAL LOW (ref 39–117)
BUN: 14 mg/dL (ref 6–23)
Glucose, Bld: 167 mg/dL — ABNORMAL HIGH (ref 70–99)
Potassium: 3.9 mEq/L (ref 3.5–5.1)
Sodium: 140 mEq/L (ref 135–145)
Total Bilirubin: 0.5 mg/dL (ref 0.3–1.2)

## 2012-12-10 LAB — CBC WITH DIFFERENTIAL/PLATELET
Eosinophils Relative: 5.2 % — ABNORMAL HIGH (ref 0.0–5.0)
HCT: 39.4 % (ref 36.0–46.0)
Hemoglobin: 13.7 g/dL (ref 12.0–15.0)
Lymphocytes Relative: 41.5 % (ref 12.0–46.0)
Lymphs Abs: 3.3 10*3/uL (ref 0.7–4.0)
Monocytes Absolute: 0.5 10*3/uL (ref 0.1–1.0)
Monocytes Relative: 6.2 % (ref 3.0–12.0)
Neutro Abs: 3.7 10*3/uL (ref 1.4–7.7)
Platelets: 304 10*3/uL (ref 150.0–400.0)
RBC: 4.23 Mil/uL (ref 3.87–5.11)
WBC: 7.9 10*3/uL (ref 4.5–10.5)

## 2012-12-10 LAB — LIPID PANEL
HDL: 30.1 mg/dL — ABNORMAL LOW (ref >39.00)
Triglycerides: 443 mg/dL — ABNORMAL HIGH (ref 0.0–149.0)
VLDL: 88.6 mg/dL — ABNORMAL HIGH (ref 0.0–40.0)

## 2012-12-10 LAB — HEMOGLOBIN A1C: Hgb A1c MFr Bld: 6 % (ref 4.6–6.5)

## 2012-12-10 LAB — MICROALBUMIN / CREATININE URINE RATIO
Creatinine,U: 266.7 mg/dL
Microalb Creat Ratio: 1.7 mg/g (ref 0.0–30.0)
Microalb, Ur: 4.5 mg/dL — ABNORMAL HIGH (ref 0.0–1.9)

## 2012-12-10 LAB — LDL CHOLESTEROL, DIRECT: Direct LDL: 89.4 mg/dL

## 2012-12-10 NOTE — Assessment & Plan Note (Signed)
Sounds stable/well controlled, but she does have a paucity of postprandial glucose information. HbA1c and urine microalb/cr today. Feet exam today shows diffuse ankles/feet decreased sensation on monofilament testing.  Due to this + large medial heel calluses + pt interest in diabetic shoes will refer to podiatry. Pneumovax and flu vaccine given IM today.

## 2012-12-10 NOTE — Assessment & Plan Note (Signed)
Ultrasound- confirmed 02/2010 (hx of transaminitis). Pt reports seeing liver specialist for this prior to getting breast cancer surgery/treatment and she recalls being told it was "not really that bad". CMET today.

## 2012-12-10 NOTE — Assessment & Plan Note (Signed)
Stable. Encouraged better compliance with daily flovent.

## 2012-12-10 NOTE — Progress Notes (Signed)
OFFICE NOTE  12/10/2012  CC:  Chief Complaint  Patient presents with  . Diabetes     HPI: Patient is a 47 y.o. Caucasian female who is here for 2 mo f/u DM 2, hyperlipidemia, obesity, peripheral neuropathy, asthma. Overall says she is doing "okay". Fasting glucoses 90-100 range.  Rare postprandial check is around 120-130 range. Still having some electric shock sensation in hands and feet along with a more chronic, low level tingling/burning sensation + feeling of lack of tactile sense in her feet.  Says neurontin has been very helpful, but she asks once again for neurology referral for this condition--was referred in the past but did not go to referral for unclear reasons. Also requests referral to bariatric clinic today. Also requests referral to podiatrist for consideration of diabetic shoes.  Says asthma is "fine" but she admits she only uses her flovent "when i'm not doing very well" instead of in a daily/preventative fashion.  She understands she is supposed to use it daily.   Pertinent PMH:  Past Medical History  Diagnosis Date  . Breast cancer 2012    left  . ADD (attention deficit disorder with hyperactivity)   . Allergic rhinitis   . Asthma   . Hypertension   . Hyperlipidemia   . GERD (gastroesophageal reflux disease)   . Morbid obesity   . RLS (restless legs syndrome)   . OSA (obstructive sleep apnea)     Dr Gwenette Greet  . Depression   . Bowel obstruction 06-25-2010    post mastectomy  . Ovarian mass     resected 07/12/10  . Type II or unspecified type diabetes mellitus without mention of complication, uncontrolled 05/15/2012  . Fatty liver 02/2010    u/s confirmed 02/2010 (hx of transaminitis)   Past surgical, social, and family history reviewed and no changes noted since last office visit.  MEDS:  Outpatient Prescriptions Prior to Visit  Medication Sig Dispense Refill  . anastrozole (ARIMIDEX) 1 MG tablet TAKE 1 TABLET(S) BY MOUTH DAILY  30 tablet  2  . calcium  carbonate (OS-CAL) 600 MG TABS Take 600 mg by mouth daily.        . cetirizine (ZYRTEC) 10 MG tablet Take 10 mg by mouth daily.        . cholecalciferol (VITAMIN D) 1000 UNITS tablet Take 1,000 Units by mouth daily.      . citalopram (CELEXA) 20 MG tablet TAKE 1 TABLET BY MOUTH EVERY DAY  90 tablet  0  . Famotidine-Ca Carb-Mag Hydrox (ACID CONTROLLER COMPLETE PO) Take by mouth. One daily       . fluticasone (FLOVENT HFA) 220 MCG/ACT inhaler Inhale 1 puff into the lungs 2 (two) times daily.  1 Inhaler  6  . gabapentin (NEURONTIN) 300 MG capsule 1 cap po qAM, 1 cap po at mid day, and 2 caps po qhs  120 capsule  5  . glucose blood (FREESTYLE LITE) test strip Use as instructed to check blood sugar.  DX 250.02  100 each  5  . ibuprofen (ADVIL,MOTRIN) 400 MG tablet Take 400 mg by mouth as needed.        . metFORMIN (GLUCOPHAGE) 1000 MG tablet Take 1 tablet (1,000 mg total) by mouth 2 (two) times daily with a meal.  60 tablet  5  . Multiple Vitamin (MULTIVITAMIN) tablet Take 1 tablet by mouth daily.        Marland Kitchen PROAIR HFA 108 (90 BASE) MCG/ACT inhaler INHALE 2 PUFFS EVERY 6 HOURS AS  NEEDED  8.5 each  5  . rOPINIRole (REQUIP) 0.5 MG tablet Take 1 tablet (0.5 mg total) by mouth daily.  30 tablet  1  . verapamil (CALAN) 120 MG tablet TAKE 1/2 TABLET TWICE A DAY  30 tablet  1  . dextroamphetamine (DEXEDRINE SPANSULE) 10 MG 24 hr capsule Take 1 capsule (10 mg total) by mouth daily.  30 capsule  0  . Lancets (FREESTYLE) lancets Use as directed to check blood sugar. DX 250.02  100 each  5  . triamterene-hydrochlorothiazide (MAXZIDE-25) 37.5-25 MG per tablet TAKE 1/2 TABLET BY MOUTH EVERY DAY  45 tablet  0   No facility-administered medications prior to visit.    PE: Blood pressure 140/84, pulse 87, temperature 98.8 F (37.1 C), temperature source Temporal, resp. rate 16, height 5' 9"  (1.753 m), weight 265 lb (120.203 kg), last menstrual period 07/14/2010, SpO2 96.00%. Gen: Alert, well appearing, morbidly  obese white female.  Patient is oriented to person, place, time, and situation. CV: RRR, no m/r/g.   LUNGS: CTA bilat, nonlabored resps, good aeration in all lung fields. EXT: no edema or cyanosis or clubbing.   Feet without deformity.  DP and PT pulses 2+ bilat.  Ankle flex/ext strength 5/5.  Toes flexion and extension 5/5 bilat but toes abd/add is weak in both feet.  Large calluses cover her medial heels.  No erythema or sign of skin breakdown.  Monofilament testing shows decreased sensation in both feet and ankles diffusely.   IMPRESSION AND PLAN:  Type II or unspecified type diabetes mellitus with neurological manifestations, not stated as uncontrolled(250.60) Sounds stable/well controlled, but she does have a paucity of postprandial glucose information. HbA1c and urine microalb/cr today. Feet exam today shows diffuse ankles/feet decreased sensation on monofilament testing.  Due to this + large medial heel calluses + pt interest in diabetic shoes will refer to podiatry. Pneumovax and flu vaccine given IM today.  Peripheral neuropathy Hands greater than feet.  Due to to chemotherapy for breast cancer( Dr. Truddie Coco) plus ? Diabetic peripheral neuropathy. Improved with neurontin: currently takes 300 mg qAM and 611m qhs---she forgets her mid-day dose. She does desire neurology referral for further evaluation and management of this.  [facility]   HYPERLIPIDEMIA Not currently medicated for this. Recheck FLP today. Hx of elevated transaminases so will recheck these today to see where they stand prior to considering initiation of statin.  Fatty liver Ultrasound- confirmed 02/2010 (hx of transaminitis). Pt reports seeing liver specialist for this prior to getting breast cancer surgery/treatment and she recalls being told it was "not really that bad". CMET today.     Blurry vision, bilateral Pt feels reassured that eyes are fine ever since her eval by Dr.  RZadie Rhinearound 08/2012.  Asthma, mild persistent Stable. Encouraged better compliance with daily flovent.  Obesity, Class III, BMI 40-49.9 (morbid obesity) Patient requests referral to Bariatric clinic today so I ordered this.   An After Visit Summary was printed and given to the patient.  FOLLOW UP:  4 mo

## 2012-12-10 NOTE — Assessment & Plan Note (Signed)
Not currently medicated for this. Recheck FLP today. Hx of elevated transaminases so will recheck these today to see where they stand prior to considering initiation of statin.

## 2012-12-10 NOTE — Assessment & Plan Note (Signed)
Hands greater than feet.  Due to to chemotherapy for breast cancer( Dr. Truddie Coco) plus ? Diabetic peripheral neuropathy. Improved with neurontin: currently takes 300 mg qAM and 671m qhs---she forgets her mid-day dose. She does desire neurology referral for further evaluation and management of this.  [facility]

## 2012-12-10 NOTE — Assessment & Plan Note (Signed)
Patient requests referral to Bariatric clinic today so I ordered this.

## 2012-12-10 NOTE — Assessment & Plan Note (Addendum)
Pt feels reassured that eyes are fine ever since her eval by Dr. Zadie Rhine around 08/2012.

## 2012-12-30 ENCOUNTER — Ambulatory Visit (INDEPENDENT_AMBULATORY_CARE_PROVIDER_SITE_OTHER): Payer: Managed Care, Other (non HMO) | Admitting: Neurology

## 2012-12-30 ENCOUNTER — Encounter: Payer: Self-pay | Admitting: Neurology

## 2012-12-30 VITALS — BP 140/72 | HR 70 | Temp 98.4°F | Resp 16 | Ht 65.0 in | Wt 268.9 lb

## 2012-12-30 DIAGNOSIS — E1149 Type 2 diabetes mellitus with other diabetic neurological complication: Secondary | ICD-10-CM

## 2012-12-30 DIAGNOSIS — G629 Polyneuropathy, unspecified: Secondary | ICD-10-CM

## 2012-12-30 DIAGNOSIS — G609 Hereditary and idiopathic neuropathy, unspecified: Secondary | ICD-10-CM

## 2012-12-30 LAB — VITAMIN B12: Vitamin B-12: 593 pg/mL (ref 211–911)

## 2012-12-30 NOTE — Patient Instructions (Addendum)
1.  Bloodwork today 2.  Can increase neurontin to 642m twice daily  3.  Drink 2-3 glasses of tonic water for cramps 4.  Return to clinic in 364-month

## 2012-12-30 NOTE — Progress Notes (Signed)
[facility] Clinic Note - Initial Visit   Date: 12/30/2012   [name] MRN: [id number] DOB: 03-Mar-1966   Dear Dr MgGowen:  Thank you for your kind referral of [name] for consultation of neuropathy. Although her history is well known to you, please allow [place] to reiterate it for the purpose of our medical record. The patient was accompanied to the clinic by self.   History of Present Illness: [name] is a 47 y.o. year-old right-handed Caucasian female with history of breast cancer s/p L mastectomy and chemotherapy (2012), ADD, asthma, GERD, HTN, HPL, diabetes mellitus (diagnosed 05/2012), RLS and depression presenting for evaluation of numbness/tingling of her feet.  Symptoms started in 2000 with rare episodes of "bee stings" in her feet.  Around 2009, she started having worsening pins and needles of feet and started involving her fingers.  In 2012 after she was diagnosed with breast cancer and underwent chemotherapy with Tamoxifen and Taxol (does not recall the names of all the medication).  During this time, the prickly sensation became even more worse.  She has associated lack of sensation over the lower leg from her ankles to mid-calf.  Symptoms are intermittent, occuring about 60% of the time. Pain is describing has intermittent electrical shooting sensation of the feet and burning.   Prolonged standing or activity exacerbate pain.  She started neurontin in 2012 and it was recently increased to 377m in the morning and [id number] which significantly helps.  She endorses dry eyes, dry mouth, and lightheadedness.  She feels that her strength is good, but is unable to spread her feet apart.  She has no had any falls, but does have problems with maintaining her balance in sand or water.    Of note, she complains of painful muscle cramps that occur intermittently  for a number of years.    Out-side paper records, electronic medical record, and images have been  reviewed where available and summarized as:   Component     Latest Ref Rng 12/10/2012  Cholesterol     0 - 200 mg/dL 162  Triglycerides     0.0 - 149.0 mg/dL 443.0 Triglyceride is over 400; calculations on Lipids are invalid. (H)  HDL     >39.00 mg/dL 30.10 (L)  VLDL     0.0 - 40.0 mg/dL 88.6 (H)  Total CHOL/HDL Ratio      5  Hemoglobin A1C     4.6 - 6.5 % 6.0    Past Medical History  Diagnosis Date  . Breast cancer 2012    left mastectomy and chemotherapy  . ADD (attention deficit disorder with hyperactivity)   . Allergic rhinitis   . Asthma   . Hypertension   . Hyperlipidemia   . GERD (gastroesophageal reflux disease)   . Morbid obesity   . RLS (restless legs syndrome)   . OSA (obstructive sleep apnea)     Dr ClGwenette Greet. Depression   . Bowel obstruction 06-25-2010    post mastectomy  . Ovarian mass     resected 07/12/10  . Type II or unspecified type diabetes mellitus without mention of complication, uncontrolled 05/15/2012  . Fatty liver 02/2010    u/s confirmed 02/2010 (hx of transaminitis)  . Neuropathy     Past Surgical History  Procedure Laterality Date  . [facility]  [facility]    [facility] B[facility] salpingoophorectomy  07/12/2010    enlarged ovary  . G 2 p 2  Medications:  Current Outpatient Prescriptions on File Prior to Visit  Medication Sig Dispense Refill  . anastrozole (ARIMIDEX) 1 MG tablet TAKE 1 TABLET(S) BY MOUTH DAILY  30 tablet  2  . calcium carbonate (OS-CAL) 600 MG TABS Take 600 mg by mouth daily.        . cetirizine (ZYRTEC) 10 MG tablet Take 10 mg by mouth daily.        . cholecalciferol (VITAMIN D) 1000 UNITS tablet Take 1,000 Units by mouth daily.      . citalopram (CELEXA) 20 MG tablet TAKE 1 TABLET BY MOUTH EVERY DAY  90 tablet  0  . Famotidine-Ca Carb-Mag Hydrox (ACID CONTROLLER COMPLETE PO) Take by mouth. One daily       . fluticasone (FLOVENT HFA) 220 MCG/ACT inhaler Inhale 1 puff into the lungs 2 (two)  times daily.  1 Inhaler  6  . gabapentin (NEURONTIN) 300 MG capsule 1 cap po qAM, 1 cap po at mid day, and 2 caps po qhs  120 capsule  5  . glucose blood (FREESTYLE LITE) test strip Use as instructed to check blood sugar.  DX 250.02  100 each  5  . ibuprofen (ADVIL,MOTRIN) 400 MG tablet Take 400 mg by mouth as needed.        . Lancets (FREESTYLE) lancets Use as directed to check blood sugar. DX 250.02  100 each  5  . metFORMIN (GLUCOPHAGE) 1000 MG tablet Take 1 tablet (1,000 mg total) by mouth 2 (two) times daily with a meal.  60 tablet  5  . Multiple Vitamin (MULTIVITAMIN) tablet Take 1 tablet by mouth daily.        Marland Kitchen PROAIR HFA 108 (90 BASE) MCG/ACT inhaler INHALE 2 PUFFS EVERY 6 HOURS AS NEEDED  8.5 each  5  . rOPINIRole (REQUIP) 0.5 MG tablet Take 1 tablet (0.5 mg total) by mouth daily.  30 tablet  1  . triamterene-hydrochlorothiazide (MAXZIDE-25) 37.5-25 MG per tablet TAKE 1/2 TABLET BY MOUTH EVERY DAY  45 tablet  0  . verapamil (CALAN) 120 MG tablet TAKE 1/2 TABLET TWICE A DAY  30 tablet  1  . dextroamphetamine (DEXEDRINE SPANSULE) 10 MG 24 hr capsule Take 1 capsule (10 mg total) by mouth daily.  30 capsule  0   No current facility-administered medications on file prior to visit.    Allergies:  Allergies  Allergen Reactions  . Metoprolol     REACTION: " chest pain"  . Sulfonamide Derivatives     nausea    Family History: Family History  Problem Relation Age of Onset  . Adopted: Yes  . Healthy Son     63  . Healthy Daughter     67  Patient is adopted, medical history of biological parents is unknown.  Social History: History   Social History  . Marital Status: Married    Spouse Name: N/A    Number of Children: 2  . Years of Education: N/A   Occupational History  . Jewelry maker    Social History Main Topics  . Smoking status: Current Every Day Smoker -- 0.50 packs/day for 24 years  . Smokeless tobacco: Never Used  . Alcohol Use: No  . Drug Use: No  . Sexual  Activity: Not on file     Comment: 1/2 pack a day   Other Topics Concern  . Not on file   Social History Narrative   She is working at [employer], previously worked as a Art therapist.  Lives with husband and two children   2-3 caffeine drinks daily           Review of Systems:  CONSTITUTIONAL: No fevers, chills, night sweats, or weight loss.   EYES: No visual changes or eye pain ENT: No hearing changes.  No history of nose bleeds.   RESPIRATORY: No cough, wheezing and shortness of breath.   CARDIOVASCULAR: Negative for chest pain, and palpitations.   GI: Negative for abdominal discomfort, blood in stools or black stools.  No recent change in bowel habits.   GU:  No history of incontinence.   MUSCLOSKELETAL: No history of joint pain or swelling.  No myalgias.   SKIN: Negative for lesions, rash, and itching.   HEMATOLOGY/ONCOLOGY: Negative for prolonged bleeding, bruising easily, and swollen nodes.   ENDOCRINE: Negative for cold or heat intolerance, polydipsia or goiter.   PSYCH:  +depression or anxiety symptoms.   NEURO: As Above.   Vital Signs:  BP 140/72  Pulse 70  Temp(Src) 98.4 F (36.9 C)  Resp 16  Ht 5' 5"  (1.651 m)  Wt 268 lb 14.4 oz (121.972 kg)  BMI 44.75 kg/m2  LMP 07/14/2010    General Medical Exam:   General:  Well appearing, obese, comfortable.   Extremities:  No deformities, edema, or skin discoloration. Good capillary refill.   Skin:  Skin color, texture, turgor normal. No rashes or lesions.  Neurological Exam: MENTAL STATUS including orientation to time, place, person, recent and remote memory, attention span and concentration, language, and fund of knowledge is normal.  Speech is not dysarthric.  CRANIAL NERVES: II:  No visual field defects.  Unremarkable fundi.   III-IV-VI: Pupils equal round and reactive to light.  Normal conjugate, extra-ocular eye movements in all directions of gaze.  No nystagmus.  No ptosis.   V:  Normal facial sensation.   VII:  Normal facial symmetry and movements.   VIII:  Normal hearing and vestibular function.   IX-X:  Normal palatal movement.   XI:  Normal shoulder shrug and head rotation.   XII:  Normal tongue strength and range of motion, no deviation or fasciculation.  MOTOR:  No atrophy, fasciculations or hand tremors.  There is a resting head tremor which is intermittent, low amplitude and medium frequency.  No pronator drift.    Right Upper Extremity:    Left Upper Extremity:    Deltoid  5/5   Deltoid  5/5   Biceps  5/5   Biceps  5/5   Triceps  5/5   Triceps  5/5   Wrist extensors  5/5   Wrist extensors  5/5   Wrist flexors  5/5   Wrist flexors  5/5   Finger extensors  5/5   Finger extensors  5/5   Finger flexors  5/5   Finger flexors  5/5   Dorsal interossei  5/5   Dorsal interossei  5/5   Abductor pollicis  5/5   Abductor pollicis  5/5   Tone (Ashworth scale)  0  Tone (Ashworth scale)  0   Right Lower Extremity:    Left Lower Extremity:    Hip flexors  5/5   Hip flexors  5/5   Hip extensors  5/5   Hip extensors  5/5   Knee flexors  5/5   Knee flexors  5/5   Knee extensors  5/5   Knee extensors  5/5   Dorsiflexors  5/5   Dorsiflexors  5/5   Plantarflexors  5/5  Plantarflexors  5/5   Toe extensors  5/5   Toe extensors  5/5   Toe flexors  5/5   Toe flexors  5/5   Tone (Ashworth scale)  0  Tone (Ashworth scale)  0   MSRs:  Right                                                                 Left brachioradialis 2+  brachioradialis 2+  biceps 2+  biceps 2+  triceps 2+  triceps 2+  patellar 2+  patellar 2+  ankle jerk trace  ankle jerk trace  Hoffman no  Hoffman no  plantar response down  plantar response down   SENSORY:  Vibration reduced to 50% at great toe, pin prick absent in feet and reduced to 50% proximal to ankles bilaterally. Pin prick over the finger pads is also reduced ~60% compared to forearms.  Light touch, temperature, and proprioception intact.  COORDINATION/GAIT:  Normal finger-to- nose-finger and heel-to-shin.  Intact rapid alternating movements bilaterally.  Able to rise from a chair without using arms.  Gait narrow based and stable. Tandem and stressed gait intact.    IMPRESSION: Mrs. [place] is a 47 year-old female presenting for evaluation dysesthesias of her hands and feet.  Her neurological examination is consistent with a small > large fiber peripheral neuropathy conforming to a length-dependent pattern.  Her symptoms started before she was diagnosed with diabetes, but it is the most likely etiology. I did applaud her for stopping carbonated beverages and iced tea which has brought her HbA1c down to 6.0.  The fact that her symptoms progressed after chemotherapy with taxol is not surprising given that it can cause nerve injury and she already had underlying neuropathy.  I would like to check for other causes of neuropathy to be sure I am not missing anything that is treatable.  In the meantime, she seems happy with symptomatic relief she is getting with neurontin.      PLAN/RECOMMENDATIONS:  1.  Check the following labs: copper, ceruloplasmin, vitamin B12, MMA, SPEP/UPEP with IFE, celiac panel 2.  Titrate neurontin to 667m BID, since she forgets to take afternoon dose 3.  Drink 1-3 glasses of tonic water for cramps 4.  Encouraged lifestyle modification including exercise (pool exercises) and tobacco cessation 5.  Return to clinic in 353-month  The duration of this appointment visit was 60 minutes of face-to-face time with the patient.  Greater than 50% of this time was spent in counseling, explanation of diagnosis, planning of further management, and coordination of care.   Thank you for allowing me to participate in patient's care.  If I can answer any additional questions, I would be pleased to do so.    Sincerely,    Santos Hardwick K. PaPosey ProntoDO

## 2012-12-31 LAB — GLIADIN ANTIBODIES, SERUM: Gliadin IgG: 2.8 U/mL (ref <20)

## 2012-12-31 LAB — CERULOPLASMIN: Ceruloplasmin: 27 mg/dL (ref 20–60)

## 2012-12-31 LAB — RETICULIN ANTIBODIES, IGA W TITER: Reticulin Ab, IgA: NEGATIVE

## 2012-12-31 LAB — TISSUE TRANSGLUTAMINASE, IGA: Tissue Transglutaminase Ab, IgA: 6.8 U/mL (ref <20)

## 2013-01-01 LAB — PROTEIN ELECTROPHORESIS, SERUM
Albumin ELP: 56.6 % (ref 55.8–66.1)
Alpha-1-Globulin: 3.7 % (ref 2.9–4.9)
Beta 2: 7.7 % — ABNORMAL HIGH (ref 3.2–6.5)
Gamma Globulin: 15.6 % (ref 11.1–18.8)
Total Protein, Serum Electrophoresis: 6.7 g/dL (ref 6.0–8.3)

## 2013-01-01 LAB — PROTEIN ELECTROPHORESIS, URINE REFLEX

## 2013-01-01 LAB — IMMUNOFIXATION ELECTROPHORESIS: Total Protein, Serum Electrophoresis: 6.7 g/dL (ref 6.0–8.3)

## 2013-01-01 LAB — METHYLMALONIC ACID, SERUM: Methylmalonic Acid, Quant: 0.09 umol/L (ref <0.40)

## 2013-01-02 ENCOUNTER — Telehealth: Payer: Self-pay | Admitting: Neurology

## 2013-01-02 NOTE — Telephone Encounter (Signed)
Called patient with results of lab tests, which were normal, but there was no answer.  Message left on voicemail.  Donika K. Posey Pronto, DO

## 2013-01-14 ENCOUNTER — Ambulatory Visit: Payer: Self-pay

## 2013-02-10 ENCOUNTER — Other Ambulatory Visit: Payer: Managed Care, Other (non HMO) | Admitting: Lab

## 2013-02-10 ENCOUNTER — Ambulatory Visit: Payer: Managed Care, Other (non HMO) | Admitting: Oncology

## 2013-03-05 ENCOUNTER — Ambulatory Visit (INDEPENDENT_AMBULATORY_CARE_PROVIDER_SITE_OTHER): Payer: Managed Care, Other (non HMO) | Admitting: Family Medicine

## 2013-03-05 ENCOUNTER — Encounter: Payer: Self-pay | Admitting: Family Medicine

## 2013-03-05 VITALS — BP 132/84 | HR 89 | Temp 99.1°F | Resp 20 | Ht 69.0 in | Wt 271.0 lb

## 2013-03-05 DIAGNOSIS — L0201 Cutaneous abscess of face: Secondary | ICD-10-CM

## 2013-03-05 DIAGNOSIS — J340 Abscess, furuncle and carbuncle of nose: Secondary | ICD-10-CM

## 2013-03-05 MED ORDER — MUPIROCIN 2 % EX OINT: 1.0000 "application " | TOPICAL_OINTMENT | Freq: Three times a day (TID) | CUTANEOUS | Status: DC

## 2013-03-05 MED ORDER — CEFTRIAXONE SODIUM 1 G IJ SOLR
1.0000 g | Freq: Once | INTRAMUSCULAR | Status: AC
  Administered 2013-03-05: 1 g via INTRAMUSCULAR

## 2013-03-05 MED ORDER — DOXYCYCLINE HYCLATE 100 MG PO CAPS: 100.0000 mg | ORAL_CAPSULE | Freq: Two times a day (BID) | ORAL | Status: DC

## 2013-03-05 NOTE — Progress Notes (Signed)
Pre visit review using our clinic review tool, if applicable. No additional management support is needed unless otherwise documented below in the visit note. 

## 2013-03-05 NOTE — Progress Notes (Signed)
OFFICE NOTE  03/05/2013  CC:  Chief Complaint  Patient presents with  . Facial Swelling    left side nose, x 2 days     HPI: Patient is a 47 y.o. Caucasian female who is here for swelling on left side of nose. Onset this morning Has had nasal congestion/mucous x 1 wk, with no ST or fever.  Noted "pimple" deep up into left nasal passage yesterday and she manipulated it some yesterday.  Today she noted the redness to the left nasal ala.  Slight HA.  General fullness feeling to left side of face/ear. Glucoses normal lately. No OTC cold meds tried.  No past hx of skin/soft tissue infection.  Pertinent PMH:  Past Medical History  Diagnosis Date  . Breast cancer 2012    left mastectomy and chemotherapy  . ADD (attention deficit disorder with hyperactivity)   . Allergic rhinitis   . Asthma   . Hypertension   . Hyperlipidemia   . GERD (gastroesophageal reflux disease)   . Morbid obesity   . RLS (restless legs syndrome)   . OSA (obstructive sleep apnea)     Dr Gwenette Greet  . Depression   . Bowel obstruction 06-25-2010    post mastectomy  . Ovarian mass     resected 07/12/10  . Type II or unspecified type diabetes mellitus without mention of complication, uncontrolled 05/15/2012  . Fatty liver 02/2010    u/s confirmed 02/2010 (hx of transaminitis)  . Neuropathy     MEDS:  Outpatient Prescriptions Prior to Visit  Medication Sig Dispense Refill  . anastrozole (ARIMIDEX) 1 MG tablet TAKE 1 TABLET(S) BY MOUTH DAILY  30 tablet  2  . calcium carbonate (OS-CAL) 600 MG TABS Take 600 mg by mouth daily.        . cetirizine (ZYRTEC) 10 MG tablet Take 10 mg by mouth daily.        . cholecalciferol (VITAMIN D) 1000 UNITS tablet Take 1,000 Units by mouth daily.      . citalopram (CELEXA) 20 MG tablet TAKE 1 TABLET BY MOUTH EVERY DAY  90 tablet  0  . gabapentin (NEURONTIN) 300 MG capsule 1 cap po qAM, 1 cap po at mid day, and 2 caps po qhs  120 capsule  5  . ibuprofen (ADVIL,MOTRIN) 400 MG tablet  Take 400 mg by mouth as needed.        . metFORMIN (GLUCOPHAGE) 1000 MG tablet Take 1 tablet (1,000 mg total) by mouth 2 (two) times daily with a meal.  60 tablet  5  . Multiple Vitamin (MULTIVITAMIN) tablet Take 1 tablet by mouth daily.        Marland Kitchen PROAIR HFA 108 (90 BASE) MCG/ACT inhaler INHALE 2 PUFFS EVERY 6 HOURS AS NEEDED  8.5 each  5  . rOPINIRole (REQUIP) 0.5 MG tablet Take 1 tablet (0.5 mg total) by mouth daily.  30 tablet  1  . triamterene-hydrochlorothiazide (MAXZIDE-25) 37.5-25 MG per tablet TAKE 1/2 TABLET BY MOUTH EVERY DAY  45 tablet  0  . verapamil (CALAN) 120 MG tablet TAKE 1/2 TABLET TWICE A DAY  30 tablet  1  . dextroamphetamine (DEXEDRINE SPANSULE) 10 MG 24 hr capsule Take 1 capsule (10 mg total) by mouth daily.  30 capsule  0  . Famotidine-Ca Carb-Mag Hydrox (ACID CONTROLLER COMPLETE PO) Take by mouth. One daily       . fluticasone (FLOVENT HFA) 220 MCG/ACT inhaler Inhale 1 puff into the lungs 2 (two) times daily.  1  Inhaler  6  . glucose blood (FREESTYLE LITE) test strip Use as instructed to check blood sugar.  DX 250.02  100 each  5  . Lancets (FREESTYLE) lancets Use as directed to check blood sugar. DX 250.02  100 each  5   No facility-administered medications prior to visit.    PE: Blood pressure 132/84, pulse 89, temperature 99.1 F (37.3 C), temperature source Temporal, resp. rate 20, height 5' 9"  (1.753 m), weight 271 lb (122.925 kg), last menstrual period 07/14/2010, SpO2 96.00%. VS: noted--normal. Gen: alert, NAD, NONTOXIC APPEARING. HEENT: eyes without injection, drainage, or swelling.  Ears: EACs clear, TMs with normal light reflex and landmarks.  Nose: Clear rhinorrhea, with some dried, crusty exudate adherent to mildly injected mucosa.  No purulent d/c.  Left nostril with lateral side wall swelling internally--about bee-bee sized.  This area is tender but firm--no fluctance, no drainage.  Outside of left nasal ala is erythematous, tender, and mildly swollen.  No  swelling anywhere else on external portion of nose or on anywhere else on face. Throat and mouth without focal lesion.  No pharyngial swelling, erythema, or exudate.   Neck: supple, no LAD.   LUNGS: CTA bilat, nonlabored resps.   CV: RRR, no m/r/g. EXT: no c/c/e SKIN: no rash  IMPRESSION AND PLAN:  Left nasal side wall infection/cellulitis. No definite signs of abscess on exam. Review of past MRSA PCR nasal swabs from 06/2010 were negative. Will give rocephin 1g IM today in office. Will start doxy 198m bid x 10d.  Bactroban ointment for application inside left nostril. F/u in 2d in office. Signs/symptoms to call or return for were reviewed and pt expressed understanding.  FOLLOW UP: 2d

## 2013-03-07 ENCOUNTER — Ambulatory Visit (INDEPENDENT_AMBULATORY_CARE_PROVIDER_SITE_OTHER): Payer: Managed Care, Other (non HMO) | Admitting: Family Medicine

## 2013-03-07 ENCOUNTER — Encounter: Payer: Self-pay | Admitting: Family Medicine

## 2013-03-07 VITALS — BP 128/84 | HR 89 | Temp 98.4°F | Resp 18 | Ht 69.0 in | Wt 274.0 lb

## 2013-03-07 DIAGNOSIS — L0390 Cellulitis, unspecified: Secondary | ICD-10-CM | POA: Insufficient documentation

## 2013-03-07 DIAGNOSIS — L0291 Cutaneous abscess, unspecified: Secondary | ICD-10-CM

## 2013-03-07 MED ORDER — CITALOPRAM HYDROBROMIDE 20 MG PO TABS: ORAL_TABLET | ORAL | Status: DC

## 2013-03-07 MED ORDER — VERAPAMIL HCL 120 MG PO TABS: ORAL_TABLET | ORAL | Status: DC

## 2013-03-07 MED ORDER — TRIAMTERENE-HCTZ 37.5-25 MG PO TABS: ORAL_TABLET | ORAL | Status: DC

## 2013-03-07 MED ORDER — ROPINIROLE HCL 0.5 MG PO TABS: 0.5000 mg | ORAL_TABLET | Freq: Every day | ORAL | Status: DC

## 2013-03-07 MED ORDER — ALBUTEROL SULFATE HFA 108 (90 BASE) MCG/ACT IN AERS: INHALATION_SPRAY | RESPIRATORY_TRACT | Status: DC

## 2013-03-07 NOTE — Progress Notes (Signed)
Pre visit review using our clinic review tool, if applicable. No additional management support is needed unless otherwise documented below in the visit note. 

## 2013-03-07 NOTE — Progress Notes (Signed)
OFFICE NOTE  03/07/2013  CC:  Chief Complaint  Patient presents with  . Follow-up    cellulitis     HPI: Patient is a 47 y.o. Caucasian female who is here for 2 d f/u cellulites to left side of nose. Feeling well now.  Nose no longer red or swollen on outside.  Minimal swelling on inside still.  No drainage.  No fevers.  She has now had 5 doses of doxycycline, also had a shot of rocephin while here 2 d/a.  Pertinent PMH:  Past Medical History  Diagnosis Date  . Breast cancer 2012    left mastectomy and chemotherapy  . ADD (attention deficit disorder with hyperactivity)   . Allergic rhinitis   . Asthma   . Hypertension   . Hyperlipidemia   . GERD (gastroesophageal reflux disease)   . Morbid obesity   . RLS (restless legs syndrome)   . OSA (obstructive sleep apnea)     Dr Gwenette Greet  . Depression   . Bowel obstruction 06-25-2010    post mastectomy  . Ovarian mass     resected 07/12/10  . Type II or unspecified type diabetes mellitus without mention of complication, uncontrolled 05/15/2012  . Fatty liver 02/2010    u/s confirmed 02/2010 (hx of transaminitis)  . Neuropathy     MEDS:  Outpatient Prescriptions Prior to Visit  Medication Sig Dispense Refill  . anastrozole (ARIMIDEX) 1 MG tablet TAKE 1 TABLET(S) BY MOUTH DAILY  30 tablet  2  . calcium carbonate (OS-CAL) 600 MG TABS Take 600 mg by mouth daily.        . cetirizine (ZYRTEC) 10 MG tablet Take 10 mg by mouth daily.        . cholecalciferol (VITAMIN D) 1000 UNITS tablet Take 1,000 Units by mouth daily.      Marland Kitchen dextroamphetamine (DEXEDRINE SPANSULE) 10 MG 24 hr capsule Take 1 capsule (10 mg total) by mouth daily.  30 capsule  0  . doxycycline (VIBRAMYCIN) 100 MG capsule Take 1 capsule (100 mg total) by mouth 2 (two) times daily.  20 capsule  0  . Famotidine-Ca Carb-Mag Hydrox (ACID CONTROLLER COMPLETE PO) Take by mouth. One daily       . fluticasone (FLOVENT HFA) 220 MCG/ACT inhaler Inhale 1 puff into the lungs 2 (two)  times daily.  1 Inhaler  6  . gabapentin (NEURONTIN) 300 MG capsule 1 cap po qAM, 1 cap po at mid day, and 2 caps po qhs  120 capsule  5  . glucose blood (FREESTYLE LITE) test strip Use as instructed to check blood sugar.  DX 250.02  100 each  5  . ibuprofen (ADVIL,MOTRIN) 400 MG tablet Take 400 mg by mouth as needed.        . metFORMIN (GLUCOPHAGE) 1000 MG tablet Take 1 tablet (1,000 mg total) by mouth 2 (two) times daily with a meal.  60 tablet  5  . Multiple Vitamin (MULTIVITAMIN) tablet Take 1 tablet by mouth daily.        . mupirocin ointment (BACTROBAN) 2 % Apply 1 application topically 3 (three) times daily. Apply to inside of left nostril tid x 10d  15 g  1  . citalopram (CELEXA) 20 MG tablet TAKE 1 TABLET BY MOUTH EVERY DAY  90 tablet  0  . PROAIR HFA 108 (90 BASE) MCG/ACT inhaler INHALE 2 PUFFS EVERY 6 HOURS AS NEEDED  8.5 each  5  . rOPINIRole (REQUIP) 0.5 MG tablet Take 1 tablet (  0.5 mg total) by mouth daily.  30 tablet  1  . triamterene-hydrochlorothiazide (MAXZIDE-25) 37.5-25 MG per tablet TAKE 1/2 TABLET BY MOUTH EVERY DAY  45 tablet  0  . verapamil (CALAN) 120 MG tablet TAKE 1/2 TABLET TWICE A DAY  30 tablet  1  . Lancets (FREESTYLE) lancets Use as directed to check blood sugar. DX 250.02  100 each  5   No facility-administered medications prior to visit.    PE: Blood pressure 128/84, pulse 89, temperature 98.4 F (36.9 C), temperature source Temporal, resp. rate 18, height 5' 9"  (1.753 m), weight 274 lb (124.286 kg), last menstrual period 07/14/2010, SpO2 94.00%. Gen: Alert, well appearing.  Patient is oriented to person, place, time, and situation. Nose: no erythema or swelling or tenderness on outside.  Left nostril with slight lateral wall focal swelling but no erythema or tenderness.  IMPRESSION AND PLAN:  Resolved (nearly) left nasal wall cellulitis. Finish doxycycline.  Finish bactroban. Signs/symptoms to call or return for were reviewed and pt expressed  understanding.   FOLLOW UP: prn

## 2013-04-11 ENCOUNTER — Ambulatory Visit: Payer: Managed Care, Other (non HMO) | Admitting: Family Medicine

## 2013-04-15 ENCOUNTER — Telehealth: Payer: Self-pay | Admitting: Family Medicine

## 2013-04-15 NOTE — Telephone Encounter (Signed)
Relevant patient education mailed to patient.  

## 2013-04-16 ENCOUNTER — Ambulatory Visit: Payer: Managed Care, Other (non HMO) | Admitting: Family Medicine

## 2013-04-21 ENCOUNTER — Telehealth: Payer: Self-pay | Admitting: Family Medicine

## 2013-04-21 ENCOUNTER — Encounter: Payer: Self-pay | Admitting: Family Medicine

## 2013-04-21 ENCOUNTER — Ambulatory Visit (INDEPENDENT_AMBULATORY_CARE_PROVIDER_SITE_OTHER): Payer: Managed Care, Other (non HMO) | Admitting: Family Medicine

## 2013-04-21 VITALS — BP 130/84 | HR 104 | Temp 99.2°F | Resp 18 | Ht 69.0 in | Wt 270.0 lb

## 2013-04-21 DIAGNOSIS — IMO0001 Reserved for inherently not codable concepts without codable children: Secondary | ICD-10-CM

## 2013-04-21 DIAGNOSIS — F17200 Nicotine dependence, unspecified, uncomplicated: Secondary | ICD-10-CM

## 2013-04-21 DIAGNOSIS — E1149 Type 2 diabetes mellitus with other diabetic neurological complication: Secondary | ICD-10-CM

## 2013-04-21 DIAGNOSIS — J45909 Unspecified asthma, uncomplicated: Secondary | ICD-10-CM

## 2013-04-21 DIAGNOSIS — Z23 Encounter for immunization: Secondary | ICD-10-CM

## 2013-04-21 DIAGNOSIS — J4530 Mild persistent asthma, uncomplicated: Secondary | ICD-10-CM

## 2013-04-21 DIAGNOSIS — E1165 Type 2 diabetes mellitus with hyperglycemia: Secondary | ICD-10-CM

## 2013-04-21 DIAGNOSIS — I10 Essential (primary) hypertension: Secondary | ICD-10-CM

## 2013-04-21 DIAGNOSIS — F909 Attention-deficit hyperactivity disorder, unspecified type: Secondary | ICD-10-CM

## 2013-04-21 DIAGNOSIS — H8110 Benign paroxysmal vertigo, unspecified ear: Secondary | ICD-10-CM

## 2013-04-21 MED ORDER — DEXTROAMPHETAMINE SULFATE ER 10 MG PO CP24: 10.0000 mg | ORAL_CAPSULE | Freq: Every day | ORAL | Status: DC

## 2013-04-21 MED ORDER — VARENICLINE TARTRATE 1 MG PO TABS
1.0000 mg | ORAL_TABLET | Freq: Two times a day (BID) | ORAL | Status: DC
Start: 2013-04-21 — End: 2013-06-13

## 2013-04-21 MED ORDER — VARENICLINE TARTRATE 0.5 MG X 11 & 1 MG X 42 PO MISC: ORAL | Status: DC

## 2013-04-21 NOTE — Telephone Encounter (Signed)
RX faxed

## 2013-04-21 NOTE — Assessment & Plan Note (Signed)
The current medical regimen is effective;  continue present plan and medications. Will check lytes/cr at next f/u in 41mo

## 2013-04-21 NOTE — Telephone Encounter (Signed)
Pls call pt and tell her I forgot to give her the rx for chantix. I printed these out. Pls fax them to her pharmacy. Fax # [phone].  If you need help using the fax machine just let me know--I'm a pro.-thx

## 2013-04-21 NOTE — Assessment & Plan Note (Signed)
Cost issues are her reason for noncompliance with fluticasone inhaler. At this point it seems things are stable off controller therapy so we'll continue prn albut for now.

## 2013-04-21 NOTE — Progress Notes (Signed)
Pre visit review using our clinic review tool, if applicable. No additional management support is needed unless otherwise documented below in the visit note. 

## 2013-04-21 NOTE — Progress Notes (Signed)
OFFICE NOTE  04/21/2013  CC:  Chief Complaint  Patient presents with  . Dizziness    started a week and half ago  . Medication Refill    dexadrine     HPI: Patient is a 48 y.o. Caucasian female who is here for 4 mo f/u DM 2, hyperlipidemia, asthma.  New prob: dizziness, has occurred 2 X in the last year.  Describes spinning sensation with change in body position.  Sometimes seems to just feel off balance when walking after getting up from lying down or sitting. Sx's last seconds.  No prob with signif HA's assoc with the dizziness.  No nausea associated, says she just feels "off".  She does not relate it to any certain meds.  She does not check bp or glucose during these episodes.  Fasting gluc checks 2-3 times per week are always around 100. She is "getting back" to a diabetic diet.  She went to [facility] bariatric clinic and has improved intake of protein and lessened carbs.  No exercise at this time.  Asthma: very stable. Requires albut rescue once every 2-3 months.  Not taking daily flovent. She smokes and wants to try chantix to quit.  (currently only 6-8 cigs per day).  Has not tried meds in past.  Pertinent PMH:  Past surgical, social, and family history reviewed and no changes noted since last office visit.  MEDS:  Outpatient Prescriptions Prior to Visit  Medication Sig Dispense Refill  . albuterol (PROAIR HFA) 108 (90 BASE) MCG/ACT inhaler INHALE 2 PUFFS EVERY 6 HOURS AS NEEDED  8.5 each  3  . anastrozole (ARIMIDEX) 1 MG tablet TAKE 1 TABLET(S) BY MOUTH DAILY  30 tablet  2  . calcium carbonate (OS-CAL) 600 MG TABS Take 600 mg by mouth daily.        . cetirizine (ZYRTEC) 10 MG tablet Take 10 mg by mouth daily.        . cholecalciferol (VITAMIN D) 1000 UNITS tablet Take 1,000 Units by mouth daily.      . citalopram (CELEXA) 20 MG tablet TAKE 1 TABLET BY MOUTH EVERY DAY  30 tablet  3  . dextroamphetamine (DEXEDRINE SPANSULE) 10 MG 24 hr capsule Take 1 capsule (10 mg total) by  mouth daily.  30 capsule  0  . gabapentin (NEURONTIN) 300 MG capsule 1 cap po qAM, 1 cap po at mid day, and 2 caps po qhs  120 capsule  5  . glucose blood (FREESTYLE LITE) test strip Use as instructed to check blood sugar.  DX 250.02  100 each  5  . ibuprofen (ADVIL,MOTRIN) 400 MG tablet Take 400 mg by mouth as needed.        . Lancets (FREESTYLE) lancets Use as directed to check blood sugar. DX 250.02  100 each  5  . metFORMIN (GLUCOPHAGE) 1000 MG tablet Take 1 tablet (1,000 mg total) by mouth 2 (two) times daily with a meal.  60 tablet  5  . Multiple Vitamin (MULTIVITAMIN) tablet Take 1 tablet by mouth daily.        Marland Kitchen rOPINIRole (REQUIP) 0.5 MG tablet Take 1 tablet (0.5 mg total) by mouth daily.  30 tablet  3  . triamterene-hydrochlorothiazide (MAXZIDE-25) 37.5-25 MG per tablet TAKE 1/2 TABLET BY MOUTH EVERY DAY  45 tablet  1  . verapamil (CALAN) 120 MG tablet TAKE 1/2 TABLET TWICE A DAY  30 tablet  3  . mupirocin ointment (BACTROBAN) 2 % Apply 1 application topically 3 (three) times  daily. Apply to inside of left nostril tid x 10d  15 g  1  . doxycycline (VIBRAMYCIN) 100 MG capsule Take 1 capsule (100 mg total) by mouth 2 (two) times daily.  20 capsule  0  . Famotidine-Ca Carb-Mag Hydrox (ACID CONTROLLER COMPLETE PO) Take by mouth. One daily       . fluticasone (FLOVENT HFA) 220 MCG/ACT inhaler Inhale 1 puff into the lungs 2 (two) times daily.  1 Inhaler  6   No facility-administered medications prior to visit.  Not on vibramycin listed above  PE: Blood pressure 130/84, pulse 104, temperature 99.2 F (37.3 C), temperature source Temporal, resp. rate 18, height 5' 9"  (1.753 m), weight 270 lb (122.471 kg), last menstrual period 07/14/2010, SpO2 95.00%. Gen: Alert, well appearing.  Patient is oriented to person, place, time, and situation. BZX:YDSW: no injection, icteris, swelling, or exudate.  EOMI, PERRLA. Mouth: lips without lesion/swelling.  Oral mucosa pink and moist. Oropharynx without  erythema, exudate, or swelling.  Neck - No masses or thyromegaly or limitation in range of motion CV: RRR, no m/r/g.   LUNGS: CTA bilat, nonlabored resps, good aeration in all lung fields. Neuro: CN 2-12 intact bilaterally, strength 5/5 in proximal and distal upper extremities and lower extremities bilaterally.    No tremor.   No ataxia.  Upper extremity and lower extremity DTRs symmetric.  No pronator drift. Dix-Halpike maneuvers neg bilat.  IMPRESSION AND PLAN:  BPPV (benign paroxysmal positional vertigo) Discussed/reviewed home epley maneuvers, handout given to patient. Signs/symptoms to call or return for were reviewed and pt expressed understanding.   Type II or unspecified type diabetes mellitus with neurological manifestations, not stated as uncontrolled(250.60) Stable. Will recheck HbA1c at next f/u in 671mosince home monitoring and A1c have recently been very good. Diet improving. Still working on getting started with exercise.  SMOKER Chantix trial--rx today. Therapeutic expectations and side effect profile of medication discussed today.  Patient's questions answered.   HYPERTENSION The current medical regimen is effective;  continue present plan and medications. Will check lytes/cr at next f/u in 451mo Asthma, mild persistent Cost issues are her reason for noncompliance with fluticasone inhaler. At this point it seems things are stable off controller therapy so we'll continue prn albut for now.  Adult ADHD The current medical regimen is effective;  continue present plan and medications. Ran out of meds around a week ago. Controlled substance contract reviewed with patient today.  Patient signed this and it will be placed in the chart.   No UDS today--will do one in future when pt consistently on the med.   An After Visit Summary was printed and given to the patient.   FOLLOW UP: 71m65moill repeat CMET, HbA1c, and lipids at next

## 2013-04-21 NOTE — Assessment & Plan Note (Signed)
Discussed/reviewed home epley maneuvers, handout given to patient. Signs/symptoms to call or return for were reviewed and pt expressed understanding.

## 2013-04-21 NOTE — Assessment & Plan Note (Signed)
The current medical regimen is effective;  continue present plan and medications. Ran out of meds around a week ago. Controlled substance contract reviewed with patient today.  Patient signed this and it will be placed in the chart.   No UDS today--will do one in future when pt consistently on the med.

## 2013-04-21 NOTE — Assessment & Plan Note (Signed)
Stable. Will recheck HbA1c at next f/u in 58mosince home monitoring and A1c have recently been very good. Diet improving. Still working on getting started with exercise.

## 2013-04-21 NOTE — Assessment & Plan Note (Signed)
Chantix trial--rx today. Therapeutic expectations and side effect profile of medication discussed today.  Patient's questions answered.

## 2013-04-22 ENCOUNTER — Telehealth: Payer: Self-pay | Admitting: Family Medicine

## 2013-04-22 NOTE — Telephone Encounter (Signed)
Relevant patient education assigned to patient using Emmi. ° °

## 2013-06-04 ENCOUNTER — Telehealth: Payer: Self-pay | Admitting: Family Medicine

## 2013-06-04 NOTE — Telephone Encounter (Signed)
Patient lmom stating that the [facility] doesn't do the lapband surgery.   Is there anywhere else you can recommend for her to go.  Please advise.

## 2013-06-05 NOTE — Telephone Encounter (Signed)
Will you call [facility] in [place] and ask if they do the lap band surgery? Let me know-thx

## 2013-06-05 NOTE — Telephone Encounter (Signed)
They do.

## 2013-06-09 ENCOUNTER — Other Ambulatory Visit: Payer: Self-pay | Admitting: Family Medicine

## 2013-06-09 NOTE — Telephone Encounter (Signed)
Referral to [facility] ordered.

## 2013-06-13 ENCOUNTER — Encounter (HOSPITAL_COMMUNITY): Payer: Self-pay | Admitting: Emergency Medicine

## 2013-06-13 ENCOUNTER — Emergency Department (HOSPITAL_COMMUNITY)
Admission: EM | Admit: 2013-06-13 | Discharge: 2013-06-13 | Disposition: A | Discharge status: Home / Self Care | Payer: Managed Care, Other (non HMO) | Attending: Emergency Medicine | Admitting: Emergency Medicine

## 2013-06-13 DIAGNOSIS — Z853 Personal history of malignant neoplasm of breast: Secondary | ICD-10-CM | POA: Insufficient documentation

## 2013-06-13 DIAGNOSIS — F3289 Other specified depressive episodes: Secondary | ICD-10-CM | POA: Insufficient documentation

## 2013-06-13 DIAGNOSIS — J45901 Unspecified asthma with (acute) exacerbation: Secondary | ICD-10-CM | POA: Insufficient documentation

## 2013-06-13 DIAGNOSIS — J329 Chronic sinusitis, unspecified: Secondary | ICD-10-CM | POA: Insufficient documentation

## 2013-06-13 DIAGNOSIS — G589 Mononeuropathy, unspecified: Secondary | ICD-10-CM | POA: Insufficient documentation

## 2013-06-13 DIAGNOSIS — G4733 Obstructive sleep apnea (adult) (pediatric): Secondary | ICD-10-CM | POA: Insufficient documentation

## 2013-06-13 DIAGNOSIS — Z8742 Personal history of other diseases of the female genital tract: Secondary | ICD-10-CM | POA: Insufficient documentation

## 2013-06-13 DIAGNOSIS — Z79899 Other long term (current) drug therapy: Secondary | ICD-10-CM | POA: Insufficient documentation

## 2013-06-13 DIAGNOSIS — F909 Attention-deficit hyperactivity disorder, unspecified type: Secondary | ICD-10-CM | POA: Insufficient documentation

## 2013-06-13 DIAGNOSIS — Z8719 Personal history of other diseases of the digestive system: Secondary | ICD-10-CM | POA: Insufficient documentation

## 2013-06-13 DIAGNOSIS — F329 Major depressive disorder, single episode, unspecified: Secondary | ICD-10-CM | POA: Insufficient documentation

## 2013-06-13 DIAGNOSIS — F17200 Nicotine dependence, unspecified, uncomplicated: Secondary | ICD-10-CM | POA: Insufficient documentation

## 2013-06-13 DIAGNOSIS — IMO0001 Reserved for inherently not codable concepts without codable children: Secondary | ICD-10-CM | POA: Insufficient documentation

## 2013-06-13 DIAGNOSIS — Z792 Long term (current) use of antibiotics: Secondary | ICD-10-CM | POA: Insufficient documentation

## 2013-06-13 DIAGNOSIS — J069 Acute upper respiratory infection, unspecified: Secondary | ICD-10-CM | POA: Insufficient documentation

## 2013-06-13 DIAGNOSIS — G2581 Restless legs syndrome: Secondary | ICD-10-CM | POA: Insufficient documentation

## 2013-06-13 DIAGNOSIS — E1165 Type 2 diabetes mellitus with hyperglycemia: Secondary | ICD-10-CM

## 2013-06-13 DIAGNOSIS — E785 Hyperlipidemia, unspecified: Secondary | ICD-10-CM | POA: Insufficient documentation

## 2013-06-13 DIAGNOSIS — I10 Essential (primary) hypertension: Secondary | ICD-10-CM | POA: Insufficient documentation

## 2013-06-13 MED ORDER — ALBUTEROL SULFATE (2.5 MG/3ML) 0.083% IN NEBU
2.5000 mg | INHALATION_SOLUTION | Freq: Once | RESPIRATORY_TRACT | Status: AC
  Administered 2013-06-13: 2.5 mg via RESPIRATORY_TRACT
  Filled 2013-06-13: qty 3

## 2013-06-13 MED ORDER — IPRATROPIUM BROMIDE 0.02 % IN SOLN: 0.5000 mg | Freq: Once | RESPIRATORY_TRACT | Status: DC

## 2013-06-13 MED ORDER — AZITHROMYCIN 250 MG PO TABS: 250.0000 mg | ORAL_TABLET | Freq: Every day | ORAL | Status: DC

## 2013-06-13 MED ORDER — IPRATROPIUM-ALBUTEROL 0.5-2.5 (3) MG/3ML IN SOLN
3.0000 mL | Freq: Once | RESPIRATORY_TRACT | Status: AC
  Administered 2013-06-13: 3 mL via RESPIRATORY_TRACT
  Filled 2013-06-13: qty 3

## 2013-06-13 MED ORDER — ALBUTEROL SULFATE (2.5 MG/3ML) 0.083% IN NEBU: 5.0000 mg | INHALATION_SOLUTION | Freq: Once | RESPIRATORY_TRACT | Status: DC

## 2013-06-13 NOTE — ED Notes (Signed)
Patient complaining of sore throat, congestion, cough, and fever. States started yesterday, worsening today. Patient reports took 4 ibuprofen at 2000. States temp was 103.7 at home.

## 2013-06-13 NOTE — ED Notes (Signed)
Cough, congestion, fever, sinus pressure began today.  T 103 at home.  Has taken 841m Ibuprofen and Albuterol INH PTA.

## 2013-06-13 NOTE — ED Provider Notes (Signed)
CSN: [id number]     Arrival date & time 06/13/13  2211 History  This chart was scribed for Johnna Acosta, MD by Zettie Pho, ED Scribe. This patient was seen in room APA11/APA11 and the patient's care was started at 11:10 PM.    Chief Complaint  Patient presents with  . Fever  . Nasal Congestion   The history is provided by the patient. No language interpreter was used.   HPI Comments: [name] is a 48 y.o. female with a history of allergic rhinitis who presents to the Emergency Department complaining of fever (Tmax 103.7 measured at home per patient, 100.9 measured in the ED) with associated sore throat, sinus pressure, congestion, and dry cough onset yesterday that she states has been progressively worsening. Patient reports taking ibuprofen and Sudafed, last dose around 3 hours ago, with moderate relief of the sinus pressure, but not her other symptoms. She denies emesis, diarrhea, abdominal pain, rash, dysuria, back pain. Patient has allergies to metoprolol and sulfonamide derivatives. Patient has a history of breast cancer (in 2012 with left mastectomy and chemotherapy), asthma, HTN, hyperlipidemia, GERD, type II DM, fatty liver, and neuropathy. Patient reports that her CBG usually runs in the 100s, but that she did not measure it today.   Past Medical History  Diagnosis Date  . Breast cancer 2012    left mastectomy and chemotherapy  . ADD (attention deficit disorder with hyperactivity)   . Allergic rhinitis   . Asthma   . Hypertension   . Hyperlipidemia   . GERD (gastroesophageal reflux disease)   . Morbid obesity   . RLS (restless legs syndrome)   . OSA (obstructive sleep apnea)     Dr Gwenette Greet  . Depression   . Bowel obstruction 06-25-2010    post mastectomy  . Ovarian mass     resected 07/12/10  . Type II or unspecified type diabetes mellitus without mention of complication, uncontrolled 05/15/2012  . Fatty liver 02/2010    u/s confirmed 02/2010 (hx of transaminitis)  .  Neuropathy    Past Surgical History  Procedure Laterality Date  . Mastectomy  06/21/2010    Left ; Dr [facility]  . Bilateral salpingoophorectomy  07/12/2010    enlarged ovary  . G 2 p 2     Family History  Problem Relation Age of Onset  . Adopted: Yes  . Healthy Son     7  . Healthy Daughter     15   History  Substance Use Topics  . Smoking status: Current Every Day Smoker -- 0.50 packs/day for 24 years  . Smokeless tobacco: Never Used  . Alcohol Use: No   OB History   Grav Para Term Preterm Abortions TAB SAB Ect Mult Living                 Review of Systems  Constitutional: Positive for fever.  HENT: Positive for congestion, sinus pressure and sore throat.   Respiratory: Positive for cough.   Gastrointestinal: Negative for vomiting, abdominal pain and diarrhea.  Genitourinary: Negative for dysuria.  Musculoskeletal: Negative for back pain.  Skin: Negative for rash.  All other systems reviewed and are negative.      Allergies  Metoprolol and Sulfonamide derivatives  Home Medications   Current Outpatient Rx  Name  Route  Sig  Dispense  Refill  . albuterol (PROAIR HFA) 108 (90 BASE) MCG/ACT inhaler   Inhalation   Inhale 1 puff into the lungs every 6 (six)  hours as needed for wheezing or shortness of breath.         . anastrozole (ARIMIDEX) 1 MG tablet   Oral   Take 1 mg by mouth daily.         . cetirizine (ZYRTEC) 10 MG tablet   Oral   Take 10 mg by mouth daily.           . cholecalciferol (VITAMIN D) 1000 UNITS tablet   Oral   Take 1,000 Units by mouth daily.         . citalopram (CELEXA) 20 MG tablet   Oral   Take 20 mg by mouth daily.         Marland Kitchen dextroamphetamine (DEXEDRINE SPANSULE) 10 MG 24 hr capsule   Oral   Take 10 mg by mouth daily.         Marland Kitchen gabapentin (NEURONTIN) 300 MG capsule   Oral   Take 600 mg by mouth 2 (two) times daily.         Marland Kitchen ibuprofen (ADVIL,MOTRIN) 200 MG tablet   Oral   Take 200 mg by mouth  every 6 (six) hours as needed for fever or moderate pain.         . metFORMIN (GLUCOPHAGE) 1000 MG tablet   Oral   Take 1,000 mg by mouth 2 (two) times daily with a meal.         . Multiple Vitamin (MULTIVITAMIN) tablet   Oral   Take 1 tablet by mouth daily.           . phenylephrine (SUDAFED PE) 10 MG TABS tablet   Oral   Take 10 mg by mouth every 4 (four) hours as needed (for congestion).         Marland Kitchen rOPINIRole (REQUIP) 0.5 MG tablet   Oral   Take 1 tablet (0.5 mg total) by mouth daily.   30 tablet   3   . triamterene-hydrochlorothiazide (MAXZIDE-25) 37.5-25 MG per tablet   Oral   Take 1 tablet by mouth daily.         . verapamil (CALAN) 120 MG tablet   Oral   Take 60 mg by mouth 2 (two) times daily.         Marland Kitchen azithromycin (ZITHROMAX Z-PAK) 250 MG tablet   Oral   Take 1 tablet (250 mg total) by mouth daily. 568m PO day 1, then 2519mPO days 205   6 tablet   0    Triage Vitals: BP 155/86  Pulse 97  Temp(Src) 100.9 F (38.3 C) (Oral)  Resp 22  Ht 5' 4"  (1.626 m)  Wt 270 lb (122.471 kg)  BMI 46.32 kg/m2  SpO2 98%  LMP 07/14/2010  Physical Exam  Nursing note and vitals reviewed. Constitutional: She is oriented to person, place, and time. She appears well-developed and well-nourished. No distress.  HENT:  Head: Normocephalic and atraumatic.  Right Ear: Hearing, tympanic membrane, external ear and ear canal normal.  Left Ear: Hearing, tympanic membrane, external ear and ear canal normal.  Mouth/Throat: No oropharyngeal exudate.  Turbinates on the left are swollen. No nasal discharge. Pharynx is erythematous, but no exudate.   Eyes: Conjunctivae are normal.  Neck: Normal range of motion. Neck supple.  Cardiovascular: Normal rate, regular rhythm and normal heart sounds.   Pulmonary/Chest: Effort normal. No respiratory distress. She has wheezes.  Expiratory wheezing. Patient is able to speak in full sentences without distress. No retractions.    Abdominal: She exhibits no distension.  Musculoskeletal: Normal range of motion.  Neurological: She is alert and oriented to person, place, and time.  Skin: Skin is warm and dry.  Psychiatric: She has a normal mood and affect. Her behavior is normal.    ED Course  Procedures (including critical care time)  DIAGNOSTIC STUDIES: Oxygen Saturation is 98% on room air, normal by my interpretation.    COORDINATION OF CARE: 11:14 PM- Will start patient on a course of antibiotics to treat developing sinus infection. Advised of further symptomatic care at home. Advised patient to monitor her CBG closely and adhere to a diabetic diet. Advised patient to follow up with her PCP if symptoms do not improve in a few days. Discussed treatment plan with patient at bedside and patient verbalized agreement.   Labs Review Labs Reviewed - No data to display Imaging Review No results found.    MDM   Final diagnoses:  Sinusitis  URI (upper respiratory infection)    Well appearing - expresses understanding, stable for d/c.  Meds given in ED:  Medications  albuterol (PROVENTIL) (2.5 MG/3ML) 0.083% nebulizer solution 2.5 mg (2.5 mg Nebulization Given 06/13/13 2249)  ipratropium-albuterol (DUONEB) 0.5-2.5 (3) MG/3ML nebulizer solution 3 mL (3 mLs Nebulization Given 06/13/13 2249)    Discharge Medication List as of 06/13/2013 11:18 PM    START taking these medications   Details  azithromycin (ZITHROMAX Z-PAK) 250 MG tablet Take 1 tablet (250 mg total) by mouth daily. 583m PO day 1, then 253mPO days 205, Starting 06/13/2013, Until Discontinued, Print          I personally performed the services described in this documentation, which was scribed in my presence. The recorded information has been reviewed and is accurate.       BrJohnna AcostaMD 06/14/13 [phone]

## 2013-06-13 NOTE — Discharge Instructions (Signed)
Please call your doctor for a followup appointment within 24-48 hours. When you talk to your doctor please let them know that you were seen in the emergency department and have them acquire all of your records so that they can discuss the findings with you and formulate a treatment plan to fully care for your new and ongoing problems.  Z pak for next 5 days, inhaler 2 puffs every 4 hours

## 2013-06-13 NOTE — ED Notes (Signed)
Patient with no complaints at this time. Respirations even and unlabored. Skin warm/dry. Discharge instructions reviewed with patient at this time. Patient given opportunity to voice concerns/ask questions. Patient discharged at this time and left Emergency Department with steady gait.   

## 2013-07-30 ENCOUNTER — Other Ambulatory Visit: Payer: Self-pay | Admitting: *Deleted

## 2013-07-30 MED ORDER — VERAPAMIL HCL 120 MG PO TABS: 60.0000 mg | ORAL_TABLET | Freq: Two times a day (BID) | ORAL | Status: DC

## 2013-08-07 ENCOUNTER — Other Ambulatory Visit: Payer: Self-pay | Admitting: *Deleted

## 2013-08-08 MED ORDER — GABAPENTIN 300 MG PO CAPS: 600.0000 mg | ORAL_CAPSULE | Freq: Two times a day (BID) | ORAL | Status: DC

## 2013-08-08 NOTE — Telephone Encounter (Signed)
Refill request for gabapentin Last filled by MD on - 08/28/2012 #120 x5 Last Appt: 04/21/2013 Next Appt: 08/19/2013 Please advise refill?

## 2013-08-19 ENCOUNTER — Ambulatory Visit (INDEPENDENT_AMBULATORY_CARE_PROVIDER_SITE_OTHER): Payer: Managed Care, Other (non HMO) | Admitting: Family Medicine

## 2013-08-19 ENCOUNTER — Encounter: Payer: Self-pay | Admitting: Family Medicine

## 2013-08-19 VITALS — BP 143/84 | HR 79 | Temp 98.3°F | Ht 69.0 in | Wt 281.0 lb

## 2013-08-19 DIAGNOSIS — J45909 Unspecified asthma, uncomplicated: Secondary | ICD-10-CM

## 2013-08-19 DIAGNOSIS — F17200 Nicotine dependence, unspecified, uncomplicated: Secondary | ICD-10-CM

## 2013-08-19 DIAGNOSIS — I10 Essential (primary) hypertension: Secondary | ICD-10-CM

## 2013-08-19 DIAGNOSIS — E1149 Type 2 diabetes mellitus with other diabetic neurological complication: Secondary | ICD-10-CM

## 2013-08-19 DIAGNOSIS — H6980 Other specified disorders of Eustachian tube, unspecified ear: Secondary | ICD-10-CM

## 2013-08-19 DIAGNOSIS — J4530 Mild persistent asthma, uncomplicated: Secondary | ICD-10-CM

## 2013-08-19 DIAGNOSIS — K7689 Other specified diseases of liver: Secondary | ICD-10-CM

## 2013-08-19 DIAGNOSIS — E785 Hyperlipidemia, unspecified: Secondary | ICD-10-CM

## 2013-08-19 DIAGNOSIS — K760 Fatty (change of) liver, not elsewhere classified: Secondary | ICD-10-CM

## 2013-08-19 LAB — COMPREHENSIVE METABOLIC PANEL
ALBUMIN: 3.6 g/dL (ref 3.5–5.2)
ALK PHOS: 38 U/L — AB (ref 39–117)
ALT: 143 U/L — ABNORMAL HIGH (ref 0–35)
AST: 69 U/L — AB (ref 0–37)
BUN: 11 mg/dL (ref 6–23)
CO2: 29 mEq/L (ref 19–32)
Calcium: 9.4 mg/dL (ref 8.4–10.5)
Chloride: 101 mEq/L (ref 96–112)
Creatinine, Ser: 0.6 mg/dL (ref 0.4–1.2)
GFR: 120.81 mL/min (ref >60.00)
Glucose, Bld: 254 mg/dL — ABNORMAL HIGH (ref 70–99)
POTASSIUM: 4 meq/L (ref 3.5–5.1)
SODIUM: 139 meq/L (ref 135–145)
TOTAL PROTEIN: 6.5 g/dL (ref 6.0–8.3)
Total Bilirubin: 0.4 mg/dL (ref 0.2–1.2)

## 2013-08-19 LAB — HEMOGLOBIN A1C: Hgb A1c MFr Bld: 6.5 % (ref 4.6–6.5)

## 2013-08-19 NOTE — Progress Notes (Signed)
OFFICE NOTE  08/19/2013  CC:  Chief Complaint  Patient presents with  . Follow-up    4 months     HPI: Patient is a 48 y.o. Caucasian female who is here for 4 mo f/u DM 2, HTN, tobacco dependence, hyperlipidemia. Not monitoring glucoses at home.  Compliant with meds but some dietary indiscretion since last f/u.  Chantix trial not started yet--she heard about nightmare potential on this med.  Still plans on trying in near future. As of now, she is still smoking.  Doing fine since stopping her flovent inhaler.  Has not required albut rescue at all since last visit.  Right ear: feels whistling sensation sometimes, muffled sensation--onset about 2 wks ago.  No vertigo/dizziness. Denies allergy sx's, takes zytec daily.  No nasal spray or sudafed.    Pertinent PMH:  Past medical, surgical, social, and family history reviewed and no changes are noted since last office visit.  MEDS:  Outpatient Prescriptions Prior to Visit  Medication Sig Dispense Refill  . albuterol (PROAIR HFA) 108 (90 BASE) MCG/ACT inhaler Inhale 1 puff into the lungs every 6 (six) hours as needed for wheezing or shortness of breath.      . anastrozole (ARIMIDEX) 1 MG tablet Take 1 mg by mouth daily.      . cetirizine (ZYRTEC) 10 MG tablet Take 10 mg by mouth daily.        . cholecalciferol (VITAMIN D) 1000 UNITS tablet Take 1,000 Units by mouth daily.      . citalopram (CELEXA) 20 MG tablet Take 20 mg by mouth daily.      Marland Kitchen dextroamphetamine (DEXEDRINE SPANSULE) 10 MG 24 hr capsule Take 10 mg by mouth daily.      Marland Kitchen gabapentin (NEURONTIN) 300 MG capsule Take 2 capsules (600 mg total) by mouth 2 (two) times daily.  120 capsule  6  . ibuprofen (ADVIL,MOTRIN) 200 MG tablet Take 200 mg by mouth as needed for fever or moderate pain.       . metFORMIN (GLUCOPHAGE) 1000 MG tablet Take 1,000 mg by mouth 2 (two) times daily with a meal.      . Multiple Vitamin (MULTIVITAMIN) tablet Take 1 tablet by mouth daily.        Marland Kitchen  rOPINIRole (REQUIP) 0.5 MG tablet Take 1 tablet (0.5 mg total) by mouth daily.  30 tablet  3  . triamterene-hydrochlorothiazide (MAXZIDE-25) 37.5-25 MG per tablet Take 1 tablet by mouth daily.      . verapamil (CALAN) 120 MG tablet Take 0.5 tablets (60 mg total) by mouth 2 (two) times daily.  90 tablet  0  . azithromycin (ZITHROMAX Z-PAK) 250 MG tablet Take 1 tablet (250 mg total) by mouth daily. 533m PO day 1, then 2512mPO days 205  6 tablet  0  . phenylephrine (SUDAFED PE) 10 MG TABS tablet Take 10 mg by mouth every 4 (four) hours as needed (for congestion).       No facility-administered medications prior to visit.    PE: Blood pressure 143/84, pulse 79, temperature 98.3 F (36.8 C), temperature source Temporal, height 5' 9"  (1.753 m), weight 281 lb (127.461 kg), last menstrual period 07/14/2010, SpO2 96.00%. Gen: Alert, well appearing.  Patient is oriented to person, place, time, and situation. ENT: Ears: EACs clear, normal epithelium.  TMs with good light reflex and landmarks bilaterally.    IMPRESSION AND PLAN:  Type II or unspecified type diabetes mellitus with neurological manifestations, not stated as uncontrolled(250.60) HbA1c today.  HYPERTENSION The current medical regimen is effective;  continue present plan and medications. Lytes/Cr today.  HYPERLIPIDEMIA With hepatic steatosis.  We've avoided med tx in recent past due to potential hypertrig med toxicity on liver. Repeat FLP when fasting at next f/u in 4 mo. Focus on TLC as usual.  Fatty liver TLC focus. Repeat AST/ALT today.  Eustachian tube dysfunction Reassured pt. Trial of saline nasal spray regularly during daytime.  Obesity, Class III, BMI 40-49.9 (morbid obesity) Has been with [facility] bariat[facility] clinic as of late, but pt requests referral to [facility] bariatric program b/c [facility] does not offer the procedure she desires (lap band). Ordered referral today as per pt request.  Tobacco dependence: encouraged  cessation--she plans to fill the chantix rx I gave her last f/u visit and give this a try.  Asthma: good control/quiescent since being off of controller inhaler. An After Visit Summary was printed and given to the patient.  FOLLOW UP: 4 mo, fasting routine chronic illness f/u

## 2013-08-19 NOTE — Progress Notes (Signed)
Pre visit review using our clinic review tool, if applicable. No additional management support is needed unless otherwise documented below in the visit note. 

## 2013-08-20 ENCOUNTER — Telehealth: Payer: Self-pay | Admitting: *Deleted

## 2013-08-20 ENCOUNTER — Telehealth: Payer: Self-pay | Admitting: Family Medicine

## 2013-08-20 ENCOUNTER — Telehealth: Payer: Self-pay | Admitting: Hematology and Oncology

## 2013-08-20 DIAGNOSIS — Z853 Personal history of malignant neoplasm of breast: Secondary | ICD-10-CM

## 2013-08-20 MED ORDER — ANASTROZOLE 1 MG PO TABS: 1.0000 mg | ORAL_TABLET | Freq: Every day | ORAL | Status: DC

## 2013-08-20 NOTE — Telephone Encounter (Signed)
PT CALLED FOR APPT. FORMER PR PT TO [facility] SEEN ONCE AND [facility] FOR LAST APPT. NEXT AVAILABLE APPT IS [facility] AND DUE TO PT HAS NOT BEEN TAKEN MEDS CALL WAS FORWARDED TO DESK NURSE (AMY). AMY WILL ASSESS PT AND WILL GIVE PT APPT FOR 8/17 LB/FU @ 1:30PM. AMY WILL LET ME KNOW IF PT NEEDS ANYTHING ADDITIONAL.

## 2013-08-20 NOTE — Telephone Encounter (Signed)
Relevant patient education assigned to patient using Emmi. ° °

## 2013-08-20 NOTE — Telephone Encounter (Signed)
Pt request for arimidex refill was fowarded to this nurse from scheduler/Melissa.  Talked with pt & she has not taken arimidex in 2-3 months due to being out of med & states that pharmacy & office wouldn't refill until she was seen.  Discussed with Dr Rob Bunting & he OK'd refill until pt can be seen in Aug by Dr. Lindi Adie.  Pt informed that this would be done.

## 2013-08-21 NOTE — Telephone Encounter (Signed)
Called back to confirm appt with Dr Lindi Adie on 8/17 @ 130. No answer, left voice mail. Arimidex was refilled 08/20/13

## 2013-08-31 DIAGNOSIS — H6980 Other specified disorders of Eustachian tube, unspecified ear: Secondary | ICD-10-CM | POA: Insufficient documentation

## 2013-08-31 NOTE — Assessment & Plan Note (Addendum)
TLC focus. Repeat AST/ALT today.

## 2013-08-31 NOTE — Assessment & Plan Note (Signed)
With hepatic steatosis.  We've avoided med tx in recent past due to potential hypertrig med toxicity on liver. Repeat FLP when fasting at next f/u in 4 mo. Focus on TLC as usual.

## 2013-08-31 NOTE — Assessment & Plan Note (Signed)
HbA1c today.

## 2013-08-31 NOTE — Assessment & Plan Note (Signed)
Has been with [facility] bariat[facility] clinic as of late, but pt requests referral to [facility] bariatric program b/c [facility] does not offer the procedure she desires (lap band). Ordered referral today as per pt request.

## 2013-08-31 NOTE — Assessment & Plan Note (Signed)
Reassured pt. Trial of saline nasal spray regularly during daytime.

## 2013-08-31 NOTE — Assessment & Plan Note (Signed)
The current medical regimen is effective;  continue present plan and medications. Lytes/Cr today.

## 2013-10-27 ENCOUNTER — Other Ambulatory Visit: Payer: Managed Care, Other (non HMO)

## 2013-10-27 ENCOUNTER — Ambulatory Visit: Payer: Managed Care, Other (non HMO) | Admitting: Hematology and Oncology

## 2013-11-05 ENCOUNTER — Other Ambulatory Visit: Payer: Self-pay | Admitting: *Deleted

## 2013-11-06 ENCOUNTER — Telehealth: Payer: Self-pay | Admitting: Hematology and Oncology

## 2013-11-06 NOTE — Telephone Encounter (Signed)
, °

## 2013-11-26 ENCOUNTER — Other Ambulatory Visit: Payer: Self-pay

## 2013-11-26 DIAGNOSIS — Z853 Personal history of malignant neoplasm of breast: Secondary | ICD-10-CM

## 2013-11-27 ENCOUNTER — Ambulatory Visit: Payer: Managed Care, Other (non HMO) | Admitting: Hematology and Oncology

## 2013-11-27 ENCOUNTER — Other Ambulatory Visit: Payer: Managed Care, Other (non HMO)

## 2013-11-28 ENCOUNTER — Other Ambulatory Visit: Payer: Self-pay

## 2013-12-01 ENCOUNTER — Telehealth: Payer: Self-pay | Admitting: Hematology and Oncology

## 2013-12-01 NOTE — Telephone Encounter (Signed)
, °

## 2013-12-03 ENCOUNTER — Other Ambulatory Visit: Payer: Self-pay | Admitting: Family Medicine

## 2013-12-08 ENCOUNTER — Telehealth: Payer: Self-pay

## 2013-12-08 ENCOUNTER — Other Ambulatory Visit: Payer: Self-pay

## 2013-12-08 MED ORDER — DEXTROAMPHETAMINE SULFATE ER 10 MG PO CP24: 10.0000 mg | ORAL_CAPSULE | Freq: Every day | ORAL | Status: DC

## 2013-12-08 NOTE — Telephone Encounter (Signed)
Called pt, Left message for pt to call back.

## 2013-12-08 NOTE — Telephone Encounter (Signed)
Rx's for this month, October, and November printed.

## 2013-12-08 NOTE — Telephone Encounter (Signed)
Pt called requesting refill on her ADD medication. Last OV 08/19/2013.

## 2013-12-09 ENCOUNTER — Other Ambulatory Visit: Payer: Self-pay | Admitting: Family Medicine

## 2013-12-09 ENCOUNTER — Other Ambulatory Visit: Payer: Self-pay | Admitting: *Deleted

## 2013-12-09 DIAGNOSIS — Z853 Personal history of malignant neoplasm of breast: Secondary | ICD-10-CM

## 2013-12-09 MED ORDER — ANASTROZOLE 1 MG PO TABS: 1.0000 mg | ORAL_TABLET | Freq: Every day | ORAL | Status: DC

## 2013-12-09 MED ORDER — METFORMIN HCL 1000 MG PO TABS: 1000.0000 mg | ORAL_TABLET | Freq: Two times a day (BID) | ORAL | Status: DC

## 2013-12-09 NOTE — Telephone Encounter (Signed)
[facility] Rx's ready to pick up

## 2013-12-22 ENCOUNTER — Other Ambulatory Visit: Payer: Self-pay

## 2013-12-22 DIAGNOSIS — Z853 Personal history of malignant neoplasm of breast: Secondary | ICD-10-CM

## 2013-12-23 ENCOUNTER — Encounter: Payer: Self-pay | Admitting: Hematology and Oncology

## 2013-12-23 ENCOUNTER — Other Ambulatory Visit (HOSPITAL_BASED_OUTPATIENT_CLINIC_OR_DEPARTMENT_OTHER): Payer: Managed Care, Other (non HMO)

## 2013-12-23 ENCOUNTER — Telehealth: Payer: Self-pay | Admitting: Hematology and Oncology

## 2013-12-23 ENCOUNTER — Ambulatory Visit (HOSPITAL_BASED_OUTPATIENT_CLINIC_OR_DEPARTMENT_OTHER): Payer: Managed Care, Other (non HMO) | Admitting: Hematology and Oncology

## 2013-12-23 ENCOUNTER — Other Ambulatory Visit: Payer: Self-pay | Admitting: Hematology and Oncology

## 2013-12-23 VITALS — BP 136/75 | HR 75 | Temp 98.5°F | Resp 20 | Ht 69.0 in | Wt 272.8 lb

## 2013-12-23 DIAGNOSIS — Z853 Personal history of malignant neoplasm of breast: Secondary | ICD-10-CM

## 2013-12-23 DIAGNOSIS — C50212 Malignant neoplasm of upper-inner quadrant of left female breast: Secondary | ICD-10-CM

## 2013-12-23 DIAGNOSIS — E1140 Type 2 diabetes mellitus with diabetic neuropathy, unspecified: Secondary | ICD-10-CM

## 2013-12-23 DIAGNOSIS — Z1239 Encounter for other screening for malignant neoplasm of breast: Secondary | ICD-10-CM

## 2013-12-23 LAB — CBC WITH DIFFERENTIAL/PLATELET
BASO%: 0.9 % (ref 0.0–2.0)
BASOS ABS: 0.1 10*3/uL (ref 0.0–0.1)
EOS%: 4.6 % (ref 0.0–7.0)
Eosinophils Absolute: 0.4 10*3/uL (ref 0.0–0.5)
HEMATOCRIT: 42.8 % (ref 34.8–46.6)
HEMOGLOBIN: 14.5 g/dL (ref 11.6–15.9)
LYMPH%: 33.9 % (ref 14.0–49.7)
MCH: 31.6 pg (ref 25.1–34.0)
MCHC: 34 g/dL (ref 31.5–36.0)
MCV: 92.8 fL (ref 79.5–101.0)
MONO#: 0.7 10*3/uL (ref 0.1–0.9)
MONO%: 7.5 % (ref 0.0–14.0)
NEUT#: 5 10*3/uL (ref 1.5–6.5)
NEUT%: 53.1 % (ref 38.4–76.8)
Platelets: 278 10*3/uL (ref 145–400)
RBC: 4.61 10*6/uL (ref 3.70–5.45)
RDW: 12.5 % (ref 11.2–14.5)
WBC: 9.4 10*3/uL (ref 3.9–10.3)
lymph#: 3.2 10*3/uL (ref 0.9–3.3)

## 2013-12-23 LAB — COMPREHENSIVE METABOLIC PANEL (CC13)
ALT: 166 U/L — AB (ref 0–55)
ANION GAP: 12 meq/L — AB (ref 3–11)
AST: 70 U/L — ABNORMAL HIGH (ref 5–34)
Albumin: 3.9 g/dL (ref 3.5–5.0)
Alkaline Phosphatase: 44 U/L (ref 40–150)
BUN: 12.8 mg/dL (ref 7.0–26.0)
CALCIUM: 10.6 mg/dL — AB (ref 8.4–10.4)
CHLORIDE: 101 meq/L (ref 98–109)
CO2: 26 meq/L (ref 22–29)
CREATININE: 0.8 mg/dL (ref 0.6–1.1)
Glucose: 156 mg/dl — ABNORMAL HIGH (ref 70–140)
Potassium: 4.4 mEq/L (ref 3.5–5.1)
Sodium: 139 mEq/L (ref 136–145)
Total Bilirubin: 0.58 mg/dL (ref 0.20–1.20)
Total Protein: 7.4 g/dL (ref 6.4–8.3)

## 2013-12-23 MED ORDER — ANASTROZOLE 1 MG PO TABS: 1.0000 mg | ORAL_TABLET | Freq: Every day | ORAL | Status: DC

## 2013-12-23 NOTE — Progress Notes (Signed)
Patient Care Team: Tammi Sou, MD as PCP - General (Family Medicine) Eston Esters, MD as Consulting Physician (Hematology and Oncology) Marylynn Pearson, MD as Consulting Physician (Ophthalmology) Kennith Center, RD as Dietitian (Family Medicine) Alda Berthold, DO as Consulting Physician (Neurology)  DIAGNOSIS: Breast cancer of upper-inner quadrant of left female breast   Primary site: Breast (Left)   Staging method: AJCC 7th Edition   Pathologic: Stage IA (T1b, N0, cM0) signed by Rulon Eisenmenger, MD on 12/23/2013  7:36 AM   Summary: Stage IA (T1b, N0, cM0)   SUMMARY OF ONCOLOGIC HISTORY:   Breast cancer of upper-inner quadrant of left female breast   05/24/2010 Initial Biopsy Invasive ductal carcinoma with DCIS extra was 80 minutes and, lymphovascular invasion identified   05/29/2010 Breast MRI Mass and masslike enhancement in the upper outer quadrant of left breast 8.3 x 5 x 3.6 cm extending up the pectoralis muscle, lymph node left axilla 1.9 cm   06/21/2010 Surgery Left mastectomy 2 foci of IDC 0.9 cm and 0.8 cm grade 1 with high-grade DCIS, angiolymphatic invasion present 3 SLN negative, ER 78%, PR 0%, Ki-67 25%, HER-2 negative, Oncotype DX recurrence score 23 (15% ROR)   07/12/2010 Surgery Hysterectomy and bilateral salpingo-oophorectomy for ovarian cyst   08/02/2010 - 10/21/2013 Chemotherapy Taxotere Cytoxan x4   10/24/2010 -  Anti-estrogen oral therapy Arimidex 1 mg daily 5 years treatment is the plan    CHIEF COMPLIANT: Followup of breast cancer  INTERVAL HISTORY: [name] is a 48 year old lady with above-mentioned history of left breast cancer. She had a very large abnormality on the initial MRI majority of which on final pathology was DCIS. She had 2 foci of invasive cancer the largest of which was 0.9 cm. ER positive HER-2 negative. With an intermediate risk Oncotype DX recurrence score she was was given 4 cycles of chemotherapy with Taxotere and Cytoxan. She has been on oral  Arimidex since August 2012. She has been tolerating this treatment fairly well without any major problems or concerns. This treatment was started after she underwent salpingo-oophorectomy for ovarian cyst. She has not got a mammogram this year. She is currently working at [employer]. 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  REVIEW OF SYSTEMS:   Constitutional: Denies fevers, chills or abnormal weight loss Eyes: Denies blurriness of vision Ears, nose, mouth, throat, and face: Denies mucositis or sore throat Respiratory: Denies cough, dyspnea or wheezes Cardiovascular: Denies palpitation, chest discomfort or lower extremity swelling Gastrointestinal:  Denies nausea, heartburn or change in bowel habits Skin: Denies abnormal skin rashes Lymphatics: Denies new lymphadenopathy or easy bruising Neurological:Denies numbness, tingling or new weaknesses Behavioral/Psych: Mood is stable, no new changes  Breast:  denies any pain or lumps or nodules in her right breast All other systems were reviewed with the patient and are negative.  I have reviewed the past medical history, past surgical history, social history and family history with the patient and they are unchanged from previous note.  ALLERGIES:  is allergic to metoprolol and sulfonamide derivatives.  MEDICATIONS:  Current Outpatient Prescriptions  Medication Sig Dispense Refill  . albuterol (PROAIR HFA) 108 (90 BASE) MCG/ACT inhaler Inhale 1 puff into the lungs every 6 (six) hours as needed for wheezing or shortness of breath.      . anastrozole (ARIMIDEX) 1 MG tablet Take 1 tablet (1 mg total) by mouth daily.  90 tablet  1  . cetirizine (ZYRTEC) 10 MG tablet Take 10 mg by mouth daily.        . cholecalciferol (VITAMIN D) 1000 UNITS tablet Take 1,000 Units by mouth daily.      . citalopram (CELEXA) 20 MG tablet TAKE 1 TABLET BY  MOUTH EVERY DAY  30 tablet  2  . dextroamphetamine (DEXEDRINE SPANSULE) 10 MG 24 hr capsule Take 1 capsule (10 mg total) by mouth daily.  30 capsule  0  . gabapentin (NEURONTIN) 300 MG capsule Take 2 capsules (600 mg total) by mouth 2 (two) times daily.  120 capsule  6  . ibuprofen (ADVIL,MOTRIN) 200 MG tablet Take 200 mg by mouth as needed for fever or moderate pain.       . metFORMIN (GLUCOPHAGE) 1000 MG tablet TAKE 1 TABLET(S) BY MOUTH TWICE DAILY WITTH MEALS  60 tablet  2  . Multiple Vitamin (MULTIVITAMIN) tablet Take 1 tablet by mouth daily.        Marland Kitchen rOPINIRole (REQUIP) 0.5 MG tablet TAKE 1 TABLET (0.5 MG TOTAL) BY MOUTH DAILY.  30 tablet  2  . triamterene-hydrochlorothiazide (MAXZIDE-25) 37.5-25 MG per tablet Take 1 tablet by mouth daily.      . verapamil (CALAN) 120 MG tablet Take 0.5 tablets (60 mg total) by mouth 2 (two) times daily.  90 tablet  0   No current facility-administered medications for this visit.    PHYSICAL EXAMINATION: ECOG PERFORMANCE STATUS: 1 - Symptomatic but completely ambulatory  Filed Vitals:   12/23/13 0854  BP: 136/75  Pulse: 75  Temp: 98.5 F (36.9 C)  Resp: 20   Filed Weights   12/23/13 0854  Weight: 272 lb 12.8 oz (123.741 kg)    GENERAL:alert, no distress and comfortable SKIN: skin color, texture, turgor are normal, no rashes or significant lesions EYES: normal, Conjunctiva are pink and non-injected, sclera clear OROPHARYNX:no exudate, no erythema and lips, buccal mucosa, and tongue normal  NECK: supple, thyroid normal size, non-tender, without nodularity LYMPH:  no palpable lymphadenopathy in the cervical, axillary or inguinal LUNGS: clear to auscultation and percussion with normal breathing effort HEART: regular rate & rhythm and no murmurs and no lower extremity edema ABDOMEN:abdomen soft, non-tender and normal bowel sounds Musculoskeletal:no cyanosis of digits and no clubbing  NEURO: alert & oriented x 3 with fluent speech, no focal  motor/sensory deficits BREAST: No palpable masses or nodules in right breast. No palpable axillary supraclavicular or infraclavicular adenopathy no breast tenderness or nipple discharge.   LABORATORY DATA:  I have reviewed the data as listed   Chemistry      Component Value Date/Time   NA 139 08/19/2013 1000   K 4.0 08/19/2013 1000   CL 101 08/19/2013 1000   CO2 29 08/19/2013 1000   BUN 11 08/19/2013 1000   CREATININE 0.6 08/19/2013 1000      Component Value Date/Time   CALCIUM 9.4 08/19/2013 1000   ALKPHOS 38* 08/19/2013 1000   AST 69* 08/19/2013 1000   ALT 143* 08/19/2013 1000   BILITOT 0.4 08/19/2013 1000       Lab Results  Component Value Date   WBC 9.4 12/23/2013   HGB 14.5 12/23/2013   HCT 42.8 12/23/2013   MCV 92.8 12/23/2013   PLT 278 12/23/2013   NEUTROABS 5.0 12/23/2013     RADIOGRAPHIC STUDIES: I have personally reviewed the radiology reports and agreed with their findings. No results found.   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  Arimidex counseling: Patient is tolerating Arimidex extremely well without any major problems. I recommended that she undergo a bone density test. She would like to do this the gynecologist office and I ordered the bone density to be scheduled at their office.  Surveillance: This was examined the right breast does not reveal any nodules. She was supposed mammogram in June 2015. She missed that appointment. I will reschedule her for another mammogram to be done within the next month. I will see her back once a year for surveillance and physical exams. Today's physical exam does not reveal any abnormalities.  Survivorship:Discussed the importance of physical exercise in  decreasing the likelihood of breast cancer recurrence. Recommended 30 mins daily 6 days a week of either brisk walking or cycling or swimming. Encouraged patient to eat more fruits and vegetables and decrease red meat.     Orders Placed This Encounter  Procedures  . MM Digital Diagnostic Unilat R    Standing Status: Future     Number of Occurrences:      Standing Expiration Date: 12/23/2014    Order Specific Question:  Reason for Exam (SYMPTOM  OR DIAGNOSIS REQUIRED)    Answer:  annual follow up on right breast with H/O Left breast cancer    Order Specific Question:  Is the patient pregnant?    Answer:  No    Order Specific Question:  Preferred imaging location?    Answer:  [facility]  . DG Bone Density    Standing Status: Future     Number of Occurrences:      Standing Expiration Date: 12/23/2014    Order Specific Question:  Reason for Exam (SYMPTOM  OR DIAGNOSIS REQUIRED)    Answer:  On Aromatase inhibitor therapy    Order Specific Question:  Is the patient pregnant?    Answer:  No    Order Specific Question:  Preferred imaging location?    Answer:  External     Comments:  [facility]  . CBC with Differential    Standing Status: Future     Number of Occurrences:      Standing Expiration Date: 12/23/2014  . Comprehensive metabolic panel (Cmet) - [facility]    Standing Status: Future     Number of Occurrences:      Standing Expiration Date: 12/23/2014   The patient has a good understanding of the overall plan. she agrees with it. She will call with any problems that may develop before her next visit here.  I spent 20 minutes counseling the patient face to face. The total time spent in the appointment was 25 minutes and more than 50% was on counseling and review of test results    Rulon Eisenmenger, MD 12/23/2013 9:18 AM

## 2013-12-23 NOTE — Assessment & Plan Note (Signed)
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  Arimidex counseling: Patient is tolerating Arimidex extremely well without any major problems. I recommended that she undergo a bone density test. She would like to do this the gynecologist office and I ordered the bone density to be scheduled at their office.  Surveillance: This was examined the right breast does not reveal any nodules. She was supposed mammogram in June 2015. She missed that appointment. I will reschedule her for another mammogram to be done within the next month. I will see her back once a year for surveillance and physical exams. Today's physical exam does not reveal any abnormalities.  Survivorship:Discussed the importance of physical exercise in decreasing the likelihood of breast cancer recurrence. Recommended 30 mins daily 6 days a week of either brisk walking or cycling or swimming. Encouraged patient to eat more fruits and vegetables and decrease red meat.

## 2013-12-23 NOTE — Telephone Encounter (Signed)
per pof to sch pt appt-gave pt copy of sch °

## 2013-12-26 ENCOUNTER — Telehealth: Payer: Self-pay

## 2013-12-26 NOTE — Telephone Encounter (Signed)
Dispensing order faxed to [facility].  Sent to scan.

## 2013-12-30 ENCOUNTER — Telehealth: Payer: Self-pay | Admitting: *Deleted

## 2013-12-30 NOTE — Telephone Encounter (Signed)
Signed orders for bras faxed to [facility]. Original sent to HIM to be scanned.

## 2014-01-27 ENCOUNTER — Ambulatory Visit: Payer: Managed Care, Other (non HMO)

## 2014-01-31 ENCOUNTER — Other Ambulatory Visit: Payer: Self-pay | Admitting: Family Medicine

## 2014-03-11 ENCOUNTER — Ambulatory Visit: Payer: Managed Care, Other (non HMO)

## 2014-03-16 ENCOUNTER — Other Ambulatory Visit: Payer: Self-pay | Admitting: Family Medicine

## 2014-03-23 ENCOUNTER — Ambulatory Visit: Payer: Managed Care, Other (non HMO)

## 2014-04-28 ENCOUNTER — Other Ambulatory Visit: Payer: Self-pay | Admitting: Family Medicine

## 2014-05-19 ENCOUNTER — Other Ambulatory Visit: Payer: Self-pay | Admitting: Family Medicine

## 2014-05-19 ENCOUNTER — Encounter: Payer: Self-pay | Admitting: Nurse Practitioner

## 2014-05-19 ENCOUNTER — Ambulatory Visit (INDEPENDENT_AMBULATORY_CARE_PROVIDER_SITE_OTHER): Payer: Managed Care, Other (non HMO) | Admitting: Nurse Practitioner

## 2014-05-19 VITALS — BP 130/70 | HR 88 | Temp 98.3°F | Ht 69.0 in | Wt 268.0 lb

## 2014-05-19 DIAGNOSIS — R7989 Other specified abnormal findings of blood chemistry: Secondary | ICD-10-CM

## 2014-05-19 DIAGNOSIS — N3001 Acute cystitis with hematuria: Secondary | ICD-10-CM

## 2014-05-19 DIAGNOSIS — R945 Abnormal results of liver function studies: Secondary | ICD-10-CM

## 2014-05-19 DIAGNOSIS — E119 Type 2 diabetes mellitus without complications: Secondary | ICD-10-CM

## 2014-05-19 DIAGNOSIS — H04123 Dry eye syndrome of bilateral lacrimal glands: Secondary | ICD-10-CM

## 2014-05-19 LAB — COMPREHENSIVE METABOLIC PANEL
ALBUMIN: 4.6 g/dL (ref 3.5–5.2)
ALT: 202 U/L — ABNORMAL HIGH (ref 0–35)
AST: 100 U/L — ABNORMAL HIGH (ref 0–37)
Alkaline Phosphatase: 42 U/L (ref 39–117)
BUN: 21 mg/dL (ref 6–23)
CO2: 30 meq/L (ref 19–32)
Calcium: 10.4 mg/dL (ref 8.4–10.5)
Chloride: 102 mEq/L (ref 96–112)
Creatinine, Ser: 0.69 mg/dL (ref 0.40–1.20)
GFR: 96.59 mL/min (ref >60.00)
GLUCOSE: 126 mg/dL — AB (ref 70–99)
POTASSIUM: 4.4 meq/L (ref 3.5–5.1)
SODIUM: 139 meq/L (ref 135–145)
TOTAL PROTEIN: 7.9 g/dL (ref 6.0–8.3)
Total Bilirubin: 0.7 mg/dL (ref 0.2–1.2)

## 2014-05-19 LAB — LIPID PANEL
Cholesterol: 182 mg/dL (ref 0–200)
HDL: 33 mg/dL — ABNORMAL LOW (ref >39.00)
NonHDL: 149
Total CHOL/HDL Ratio: 6
Triglycerides: 208 mg/dL — ABNORMAL HIGH (ref 0.0–149.0)
VLDL: 41.6 mg/dL — ABNORMAL HIGH (ref 0.0–40.0)

## 2014-05-19 LAB — POCT URINALYSIS DIPSTICK
Bilirubin, UA: NEGATIVE
Glucose, UA: NEGATIVE
Ketones, UA: NEGATIVE
NITRITE UA: NEGATIVE
PH UA: 5.5
PROTEIN UA: NEGATIVE
Spec Grav, UA: >=1.03
Urobilinogen, UA: 0.2

## 2014-05-19 LAB — CBC WITH DIFFERENTIAL/PLATELET
BASOS PCT: 1 % (ref 0.0–3.0)
Basophils Absolute: 0.1 10*3/uL (ref 0.0–0.1)
Eosinophils Absolute: 0.2 10*3/uL (ref 0.0–0.7)
Eosinophils Relative: 2.8 % (ref 0.0–5.0)
HEMATOCRIT: 43.4 % (ref 36.0–46.0)
Hemoglobin: 15 g/dL (ref 12.0–15.0)
LYMPHS ABS: 2.5 10*3/uL (ref 0.7–4.0)
Lymphocytes Relative: 33.8 % (ref 12.0–46.0)
MCHC: 34.7 g/dL (ref 30.0–36.0)
MCV: 92.2 fl (ref 78.0–100.0)
MONO ABS: 0.5 10*3/uL (ref 0.1–1.0)
Monocytes Relative: 7.1 % (ref 3.0–12.0)
Neutro Abs: 4.1 10*3/uL (ref 1.4–7.7)
Neutrophils Relative %: 55.3 % (ref 43.0–77.0)
PLATELETS: 315 10*3/uL (ref 150.0–400.0)
RBC: 4.71 Mil/uL (ref 3.87–5.11)
RDW: 12.8 % (ref 11.5–15.5)
WBC: 7.4 10*3/uL (ref 4.0–10.5)

## 2014-05-19 LAB — SEDIMENTATION RATE: Sed Rate: 31 mm/hr — ABNORMAL HIGH (ref 0–22)

## 2014-05-19 LAB — URINALYSIS, MICROSCOPIC ONLY

## 2014-05-19 LAB — MICROALBUMIN / CREATININE URINE RATIO
Creatinine,U: 77.3 mg/dL
MICROALB/CREAT RATIO: 1.9 mg/g (ref 0.0–30.0)
Microalb, Ur: 1.5 mg/dL (ref 0.0–1.9)

## 2014-05-19 LAB — HEMOGLOBIN A1C: Hgb A1c MFr Bld: 6.8 % — ABNORMAL HIGH (ref 4.6–6.5)

## 2014-05-19 LAB — LDL CHOLESTEROL, DIRECT: Direct LDL: 118 mg/dL

## 2014-05-19 MED ORDER — CIPROFLOXACIN HCL 250 MG PO TABS: 250.0000 mg | ORAL_TABLET | Freq: Two times a day (BID) | ORAL | Status: DC

## 2014-05-19 NOTE — Patient Instructions (Signed)
My office will call with lab results and follow up.  In the meantime, use biotin products for dry mouth. Continue systane.  Start antibiotic. Our office will call if we need to change the antibiotic. Sip hydrating fluids (water, juice, colorless soda, decaff tea) every hour to flush kidneys. Return in 1 month to recheck urine or sooner if symptoms do not improve or you feel worse.  Great job with diet changes! Keep up the good work!  Urinary Tract Infection Urinary tract infections (UTIs) can develop anywhere along your urinary tract. Your urinary tract is your body's drainage system for removing wastes and extra water. Your urinary tract includes two kidneys, two ureters, a bladder, and a urethra. Your kidneys are a pair of bean-shaped organs. Each kidney is about the size of your fist. They are located below your ribs, one on each side of your spine. CAUSES Infections are caused by microbes, which are microscopic organisms, including fungi, viruses, and bacteria. These organisms are so small that they can only be seen through a microscope. Bacteria are the microbes that most commonly cause UTIs. SYMPTOMS  Symptoms of UTIs may vary by age and gender of the patient and by the location of the infection. Symptoms in young women typically include a frequent and intense urge to urinate and a painful, burning feeling in the bladder or urethra during urination. Older women and men are more likely to be tired, shaky, and weak and have muscle aches and abdominal pain. A fever may mean the infection is in your kidneys. Other symptoms of a kidney infection include pain in your back or sides below the ribs, nausea, and vomiting. DIAGNOSIS To diagnose a UTI, your caregiver will ask you about your symptoms. Your caregiver also will ask to provide a urine sample. The urine sample will be tested for bacteria and white blood cells. White blood cells are made by your body to help fight infection. TREATMENT    Typically, UTIs can be treated with medication. Because most UTIs are caused by a bacterial infection, they usually can be treated with the use of antibiotics. The choice of antibiotic and length of treatment depend on your symptoms and the type of bacteria causing your infection. HOME CARE INSTRUCTIONS  If you were prescribed antibiotics, take them exactly as your caregiver instructs you. Finish the medication even if you feel better after you have only taken some of the medication.  Drink enough water and fluids to keep your urine clear or pale yellow.  Avoid caffeine, tea, and carbonated beverages. They tend to irritate your bladder.  Empty your bladder often. Avoid holding urine for long periods of time.  Empty your bladder before and after sexual intercourse.  After a bowel movement, women should cleanse from front to back. Use each tissue only once. SEEK MEDICAL CARE IF:   You have back pain.  You develop a fever.  Your symptoms do not begin to resolve within 3 days. SEEK IMMEDIATE MEDICAL CARE IF:   You have severe back pain or lower abdominal pain.  You develop chills.  You have nausea or vomiting.  You have continued burning or discomfort with urination. MAKE SURE YOU:   Understand these instructions.  Will watch your condition.  Will get help right away if you are not doing well or get worse. Document Released: 12/07/2004 Document Revised: 08/29/2011 Document Reviewed: 04/07/2011 [facility] Patient Information 2014 [place].

## 2014-05-19 NOTE — Progress Notes (Signed)
Pre visit review using our clinic review tool, if applicable. No additional management support is needed unless otherwise documented below in the visit note. 

## 2014-05-20 LAB — SJOGRENS SYNDROME-A EXTRACTABLE NUCLEAR ANTIBODY: SSA (Ro) (ENA) Antibody, IgG: <1

## 2014-05-20 LAB — HEPATITIS A ANTIBODY, TOTAL: HEP A TOTAL AB: REACTIVE — AB

## 2014-05-20 LAB — SJOGRENS SYNDROME-B EXTRACTABLE NUCLEAR ANTIBODY: SSB (La) (ENA) Antibody, IgG: <1

## 2014-05-20 LAB — ANTINUCLEAR ANTIBODIES, IFA: ANA TITER 1: NEGATIVE

## 2014-05-20 LAB — HEPATITIS B SURFACE ANTIGEN: HEP B S AG: NEGATIVE

## 2014-05-20 LAB — HEPATITIS C ANTIBODY: HCV AB: NEGATIVE

## 2014-05-22 ENCOUNTER — Telehealth: Payer: Self-pay | Admitting: Family Medicine

## 2014-05-22 LAB — URINE CULTURE: Colony Count: >=100000

## 2014-05-22 NOTE — Telephone Encounter (Signed)
Patient wants to know lab results.  Please advise.

## 2014-05-22 NOTE — Telephone Encounter (Signed)
Labs: liver enzymes slightly higher. Pt says she saw hepatologist 2 yrs ago. Dx fatty liver. Hep A was reactv-pt thinks she had Hep A vaccine [place]. A1C 6.8. Taking max dose metformin. She has made diet changes & planning for more. Wants to come in for nutrition counseling. Will check again in 3 mos. sjogrens w/u neg. Sed rate is high nml. Elevation would be 33 per ACR standards. Lipids improved, still need work. Will address w/diet change UTI Tx appropriate. Pt to call to sched appt for wt loss management

## 2014-05-22 NOTE — Telephone Encounter (Signed)
Sorry, I sent this to Dr. [name].  Please advise.

## 2014-05-24 NOTE — Progress Notes (Signed)
Subjective:     [name] is a 49 y.o. female c/o "thirsty all the time", urinary frequency, & dry eyes. All symptom present for few weeks. Symptoms associated w/flank pain, malodorous urine that started 2-3 days ago.  She is using systane for dry eyes w/mild relief. Eyes "feel like they have sand in them". Denies vision changes. She c/o dry mouth & "hard to swallow sometimes". Denies vag dryness. She is taking metformin as prescribed. She is morbidly obese, but has made recent diet changes-decreased carbs & has lost 4 lbs. In last 10 days. Past med Hx: L breast ca Dx 2012- Tx: mastectomy , chemo, currently on arimadex. 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 Elevated liver enzymes-pt reports she was evaluated by hepatologist-thinks due to fatty liver. DM 2-current meds metformin 1000 mg bid.  The following portions of the patient's history were reviewed and updated as appropriate: allergies, current medications, past medical history, past social history, past surgical history and problem list.  Review of Systems Constitutional: negative for chills, fatigue and fevers Ears, nose, mouth, throat, and face: negative for sore mouth and sore throat Respiratory: negative for cough Cardiovascular: negative for irregular heart beat Gastrointestinal: positive for reflux symptoms Genitourinary:positive for dysuria and frequency, negative for vaginal discharge Musculoskeletal:negative for arthralgias Neurological: negative for headaches Endocrine: negative for diabetic symptoms including poor wound healing and temperature intolerance    Objective:    BP 130/70 mmHg  Pulse 88  Temp(Src) 98.3 F (36.8 C) (Temporal)  Ht 5' 9"  (1.753 m)  Wt 268 lb (121.564 kg)  BMI 39.56 kg/m2  SpO2 96%  LMP 07/14/2010 BP 130/70 mmHg  Pulse 88  Temp(Src) 98.3 F (36.8 C) (Temporal)  Ht 5' 9"  (1.753 m)  Wt 268 lb (121.564 kg)  BMI 39.56 kg/m2  SpO2 96%  LMP 07/14/2010 General appearance: alert, cooperative, appears stated age  and no distress Head: Normocephalic, without obvious abnormality, atraumatic Eyes: negative findings: lids and lashes normal, conjunctivae and sclerae normal, corneas clear and pupils equal, round, reactive to light and accomodation Neck: no adenopathy, no carotid bruit, supple, symmetrical, trachea midline and thyroid not enlarged, symmetric, no tenderness/mass/nodules Lungs: clear to auscultation bilaterally Heart: regular rate and rhythm, S1, S2 normal, no murmur, click, rub or gallop Abdomen: normal findings: no masses palpable, no organomegaly, soft, non-tender and spleen non-palpable and abnormal findings:  obese Extremities: extremities normal, atraumatic, no cyanosis or edema Lymph nodes: Cervical adenopathy: none and Supraclavicular adenopathy: none Neurologic: Grossly normal   MSK: no CVA tenderness Assessment:Plan   1. Acute cystitis with hematuria, new - POCT urinalysis dipstick-leuks, trace blood, SG 1.030 - ciprofloxacin (CIPRO) 250 MG tablet; Take 1 tablet (250 mg total) by mouth 2 (two) times daily.  Dispense: 6 tablet; Refill: 0 - Urine culture - Urine Microscopic  2. Elevated LFTs, chronic DD: fatty liver, viral hep, SE arimadex - CBC with Differential/Platelet - Comprehensive metabolic panel - Hepatitis B surface antigen - Hepatitis C antibody - Hepatitis A Ab, Total  3. Dry eye syndrome, bilateral, New XJ:OITGPQDI, uncontrolled DM - Sedimentation rate - Antinuclear Antibodies, IFA - Sjogrens syndrome-A extractable nuclear antibody - Sjogrens syndrome-B extractable nuclear antibody  4. Type 2 diabetes mellitus without complication Having symptoms that may be r/t hyperglycemia - Hemoglobin A1c - Microalbumin / creatinine urine ratio - Lipid panel  See pt instructions. F/u  1 wk

## 2014-05-25 NOTE — Telephone Encounter (Signed)
Noted  

## 2014-06-06 ENCOUNTER — Other Ambulatory Visit: Payer: Self-pay | Admitting: Family Medicine

## 2014-06-22 ENCOUNTER — Other Ambulatory Visit: Payer: Self-pay | Admitting: Family Medicine

## 2014-07-10 ENCOUNTER — Other Ambulatory Visit: Payer: Self-pay | Admitting: Family Medicine

## 2014-07-28 ENCOUNTER — Encounter: Payer: Self-pay | Admitting: Family Medicine

## 2014-07-28 ENCOUNTER — Ambulatory Visit (INDEPENDENT_AMBULATORY_CARE_PROVIDER_SITE_OTHER): Payer: Managed Care, Other (non HMO) | Admitting: Family Medicine

## 2014-07-28 VITALS — BP 122/85 | HR 98 | Temp 100.2°F | Resp 18 | Wt 262.0 lb

## 2014-07-28 DIAGNOSIS — J069 Acute upper respiratory infection, unspecified: Secondary | ICD-10-CM

## 2014-07-28 DIAGNOSIS — J4531 Mild persistent asthma with (acute) exacerbation: Secondary | ICD-10-CM

## 2014-07-28 MED ORDER — AZITHROMYCIN 250 MG PO TABS: ORAL_TABLET | ORAL | Status: DC

## 2014-07-28 MED ORDER — PREDNISONE 20 MG PO TABS: ORAL_TABLET | ORAL | Status: DC

## 2014-07-28 MED ORDER — IPRATROPIUM BROMIDE 0.02 % IN SOLN
0.5000 mg | Freq: Once | RESPIRATORY_TRACT | Status: AC
  Administered 2014-07-28: 0.5 mg via RESPIRATORY_TRACT

## 2014-07-28 NOTE — Patient Instructions (Signed)
Buy OTC generic robitussin DM to use as directed on the packaging for cough.

## 2014-07-28 NOTE — Progress Notes (Signed)
Pre visit review using our clinic review tool, if applicable. No additional management support is needed unless otherwise documented below in the visit note. 

## 2014-07-28 NOTE — Progress Notes (Signed)
OFFICE NOTE  07/28/2014  CC:  Chief Complaint  Patient presents with  . Cough    x 2-3 days, was non-productive but in now productive, nasal congestion, ear pain and felt feverish.    HPI: Patient is a 49 y.o. Caucasian female who is here for respiratory complaints. Onset 2-3d/a, nasal cong/runny nose, cough getting more productive, ST initially, subjective fevers, +SOB/wheezing. Albuterol inhaler helped some yesterday. Ears/jaw and some neck aching.     Pertinent PMH:  Past medical, surgical, social, and family history reviewed and no changes are noted since last office visit. +Mild persistent asthma-hx of noncompliance with controller therapy due to cost. +OSA. +Tob dependence  MEDS: Pt not taking cipro listed below Outpatient Prescriptions Prior to Visit  Medication Sig Dispense Refill  . albuterol (PROAIR HFA) 108 (90 BASE) MCG/ACT inhaler Inhale 1 puff into the lungs every 6 (six) hours as needed for wheezing or shortness of breath.    . anastrozole (ARIMIDEX) 1 MG tablet Take 1 tablet (1 mg total) by mouth daily. 90 tablet 1  . cetirizine (ZYRTEC) 10 MG tablet Take 10 mg by mouth daily.      . cholecalciferol (VITAMIN D) 1000 UNITS tablet Take 1,000 Units by mouth daily.    . citalopram (CELEXA) 20 MG tablet TAKE 1 TABLET BY MOUTH EVERY DAY 30 tablet 1  . dextroamphetamine (DEXEDRINE SPANSULE) 10 MG 24 hr capsule Take 1 capsule (10 mg total) by mouth daily. 30 capsule 0  . gabapentin (NEURONTIN) 300 MG capsule TAKE 2 CAPSULES (600 MG TOTAL) BY MOUTH 2 (TWO) TIMES DAILY. 120 capsule 6  . ibuprofen (ADVIL,MOTRIN) 200 MG tablet Take 200 mg by mouth as needed for fever or moderate pain.     . metFORMIN (GLUCOPHAGE) 1000 MG tablet TAKE 1 TABLET(S) BY MOUTH TWICE DAILY WITTH MEALS 60 tablet 2  . Multiple Vitamin (MULTIVITAMIN) tablet Take 1 tablet by mouth daily.      Marland Kitchen rOPINIRole (REQUIP) 0.5 MG tablet TAKE 1 TABLET (0.5 MG TOTAL) BY MOUTH DAILY. 30 tablet 6  .  triamterene-hydrochlorothiazide (MAXZIDE-25) 37.5-25 MG per tablet TAKE 1/2 TABLET BY MOUTH EVERY DAY 45 tablet 0  . verapamil (CALAN) 120 MG tablet TAKE 0.5 TABLETS (60 MG TOTAL) BY MOUTH 2 (TWO) TIMES DAILY. 90 tablet 0  . ciprofloxacin (CIPRO) 250 MG tablet Take 1 tablet (250 mg total) by mouth 2 (two) times daily. (Patient not taking: Reported on 07/28/2014) 6 tablet 0   No facility-administered medications prior to visit.    PE: Blood pressure 122/85, pulse 98, temperature 100.2 F (37.9 C), temperature source Oral, resp. rate 18, weight 262 lb (118.842 kg), last menstrual period 07/14/2010, SpO2 91 %. Room air. VS: noted--normal. Gen: alert, NAD, NONTOXIC APPEARING. HEENT: eyes without injection, drainage, or swelling.  Ears: EACs clear, TMs with normal light reflex and landmarks.  Nose: Clear rhinorrhea, with some dried, crusty exudate adherent to mildly injected mucosa.  No purulent d/c.  No paranasal sinus TTP.  No facial swelling.  Throat and mouth without focal lesion.  No pharyngial swelling, erythema, or exudate.   Neck: supple, no LAD.   LUNGS: Diffuse insp rhonchi and diffuse coarse exp wheezing, exp phase is interrupted consistently with lots of coughing.  Nonlabored resps.   CV: Regular, tachy to about 100-105, no m/r/g. EXT: no c/c/e SKIN: no rash  IMPRESSION AND PLAN:  URI, with acute flare of mild persistent asthma. Alb/atr neb in office today: markedly improved aeration and less wheezing after this, +pt  reported feeling like breathing was much improved. Prednisone 2m qd x 5d, then 241mqd x 5d. Albuterol q4h prn at home. Zpack sent to pharmacy.  Again, would recommend daily controller therapy for her asthma but pt unable to afford so she declines.  An After Visit Summary was printed and given to the patient.  FOLLOW UP: prn

## 2014-08-16 ENCOUNTER — Other Ambulatory Visit: Payer: Self-pay | Admitting: Family Medicine

## 2014-08-18 ENCOUNTER — Other Ambulatory Visit: Payer: Self-pay | Admitting: Family Medicine

## 2014-08-27 ENCOUNTER — Other Ambulatory Visit: Payer: Self-pay | Admitting: Family Medicine

## 2014-08-27 NOTE — Telephone Encounter (Signed)
Pt advised and voiced understanding.   

## 2014-08-27 NOTE — Telephone Encounter (Signed)
RF request for 1) Citalopram 05/19/14 #30 w/ 1 and 2) metformin 12/09/13 #60 w/ 2. LOV 07/28/14, no up coming office visit. Please advise. Thanks.

## 2014-08-27 NOTE — Telephone Encounter (Signed)
Will do metformin and citalopram RF's as per pt request.  Needs DM 2 f/u in 3 mo.-thx

## 2014-09-10 ENCOUNTER — Telehealth: Payer: Self-pay

## 2014-09-10 NOTE — Telephone Encounter (Signed)
Dispensing Order faxed to [facility].  Sent to scan.

## 2014-09-22 ENCOUNTER — Other Ambulatory Visit: Payer: Self-pay | Admitting: Family Medicine

## 2014-09-22 NOTE — Telephone Encounter (Signed)
RF request for ropinirole and verapamil LOV: for f/u 08/19/2013 for acute 07/28/14 Next ov: None Last written: 07/10/14 #30 w/ 6RF and 06/22/14 #90 w/ 0RF Please advise. Thanks.

## 2014-10-13 ENCOUNTER — Encounter: Payer: Self-pay | Admitting: Family Medicine

## 2014-10-13 ENCOUNTER — Ambulatory Visit (INDEPENDENT_AMBULATORY_CARE_PROVIDER_SITE_OTHER): Payer: Managed Care, Other (non HMO) | Admitting: Family Medicine

## 2014-10-13 VITALS — BP 131/85 | HR 89 | Temp 98.0°F | Resp 16 | Ht 69.0 in | Wt 261.0 lb

## 2014-10-13 DIAGNOSIS — Z716 Tobacco abuse counseling: Secondary | ICD-10-CM | POA: Diagnosis not present

## 2014-10-13 DIAGNOSIS — R740 Nonspecific elevation of levels of transaminase and lactic acid dehydrogenase [LDH]: Secondary | ICD-10-CM

## 2014-10-13 DIAGNOSIS — I10 Essential (primary) hypertension: Secondary | ICD-10-CM

## 2014-10-13 DIAGNOSIS — R7401 Elevation of levels of liver transaminase levels: Secondary | ICD-10-CM

## 2014-10-13 DIAGNOSIS — E119 Type 2 diabetes mellitus without complications: Secondary | ICD-10-CM

## 2014-10-13 LAB — COMPREHENSIVE METABOLIC PANEL
ALT: 107 U/L — ABNORMAL HIGH (ref 0–35)
AST: 51 U/L — ABNORMAL HIGH (ref 0–37)
Albumin: 4.1 g/dL (ref 3.5–5.2)
Alkaline Phosphatase: 37 U/L — ABNORMAL LOW (ref 39–117)
BILIRUBIN TOTAL: 0.5 mg/dL (ref 0.2–1.2)
BUN: 13 mg/dL (ref 6–23)
CO2: 30 meq/L (ref 19–32)
Calcium: 9.6 mg/dL (ref 8.4–10.5)
Chloride: 102 mEq/L (ref 96–112)
Creatinine, Ser: 0.57 mg/dL (ref 0.40–1.20)
GFR: 120.22 mL/min (ref >60.00)
Glucose, Bld: 171 mg/dL — ABNORMAL HIGH (ref 70–99)
Potassium: 4.2 mEq/L (ref 3.5–5.1)
Sodium: 141 mEq/L (ref 135–145)
Total Protein: 6.9 g/dL (ref 6.0–8.3)

## 2014-10-13 LAB — HEMOGLOBIN A1C: Hgb A1c MFr Bld: 5.9 % (ref 4.6–6.5)

## 2014-10-13 MED ORDER — DEXTROAMPHETAMINE SULFATE ER 10 MG PO CP24: 10.0000 mg | ORAL_CAPSULE | Freq: Every day | ORAL | Status: DC

## 2014-10-13 MED ORDER — FREESTYLE FREEDOM KIT
PACK | Status: DC
Start: 2014-10-13 — End: 2016-03-22

## 2014-10-13 MED ORDER — BUPROPION HCL ER (XL) 150 MG PO TB24: 150.0000 mg | ORAL_TABLET | Freq: Every day | ORAL | Status: DC

## 2014-10-13 NOTE — Progress Notes (Signed)
OFFICE VISIT  10/13/2014   CC:  Chief Complaint  Patient presents with  . Follow-up    Pt is not fasting.     HPI:    Patient is a 49 y.o. Caucasian female who presents for f/u DM 2, HTN, morbid obesity, hx of elevated transaminases secondary to fatty liver, and adult ADD.  DM 2: lost glucometer.  Will rx freestyle gluc and supplies.   Not doing too well with diabetic diet.  Compliant with metformin. Not exercising any but has a non-sedentary job.  HTN: Has no bp cuff to monitor bp at home.  Alcohol intake: a drink or two every 3-6 mo.  Has been seen by liver specialist around 2012 and was told it was "no big deal".  Out of dexedrine: this helps her focus/concentrate, keeps her from being overwhelmed, has positive effect of appetite suppression.   She still smokes, chantix was too complicated.  Tolerated wellbutrin in the past for depression. Smokes 1/2-1 pack per day.  Past Medical History  Diagnosis Date  . Breast cancer 2012    left mastectomy and chemotherapy  . ADD (attention deficit disorder with hyperactivity)   . Allergic rhinitis   . Mild persistent asthma     dx'd age 17 (Dr. Gwenette Greet) hx of noncompliance with controller therapy due to cost  . Hypertension   . Hyperlipidemia   . GERD (gastroesophageal reflux disease)   . Morbid obesity   . RLS (restless legs syndrome)   . OSA (obstructive sleep apnea)     Dr Gwenette Greet  . Depression   . Bowel obstruction 06-25-2010    post mastectomy  . Ovarian mass     resected 07/12/10  . Type II or unspecified type diabetes mellitus without mention of complication, uncontrolled 05/15/2012  . Fatty liver 02/2010    u/s confirmed 02/2010 (hx of transaminitis)  . Neuropathy     Past Surgical History  Procedure Laterality Date  . Mastectomy  06/21/2010    Left ; Dr [facility]  . Bilateral salpingoophorectomy  07/12/2010    enlarged ovary  . G 2 p 2      Outpatient Prescriptions Prior to Visit  Medication Sig  Dispense Refill  . albuterol (PROAIR HFA) 108 (90 BASE) MCG/ACT inhaler Inhale 1 puff into the lungs every 6 (six) hours as needed for wheezing or shortness of breath.    . anastrozole (ARIMIDEX) 1 MG tablet Take 1 tablet (1 mg total) by mouth daily. 90 tablet 1  . cetirizine (ZYRTEC) 10 MG tablet Take 10 mg by mouth daily.      . cholecalciferol (VITAMIN D) 1000 UNITS tablet Take 1,000 Units by mouth daily.    . citalopram (CELEXA) 20 MG tablet TAKE 1 TABLET BY MOUTH EVERY DAY 30 tablet 11  . gabapentin (NEURONTIN) 300 MG capsule TAKE 2 CAPSULES (600 MG TOTAL) BY MOUTH 2 (TWO) TIMES DAILY. 120 capsule 6  . ibuprofen (ADVIL,MOTRIN) 200 MG tablet Take 200 mg by mouth as needed for fever or moderate pain.     . metFORMIN (GLUCOPHAGE) 1000 MG tablet TAKE 1 TABLET (1,000 MG TOTAL) BY MOUTH 2 (TWO) TIMES DAILY WITH A MEAL. 60 tablet 3  . Multiple Vitamin (MULTIVITAMIN) tablet Take 1 tablet by mouth daily.      Marland Kitchen rOPINIRole (REQUIP) 0.5 MG tablet TAKE 1 TABLET (0.5 MG TOTAL) BY MOUTH DAILY. 30 tablet 6  . triamterene-hydrochlorothiazide (MAXZIDE-25) 37.5-25 MG per tablet TAKE 1/2 TABLET BY MOUTH EVERY DAY 45 tablet 1  .  verapamil (CALAN) 120 MG tablet TAKE 0.5 TABLETS (60 MG TOTAL) BY MOUTH 2 (TWO) TIMES DAILY. 60 tablet 6  . dextroamphetamine (DEXEDRINE SPANSULE) 10 MG 24 hr capsule Take 1 capsule (10 mg total) by mouth daily. 30 capsule 0  . azithromycin (ZITHROMAX) 250 MG tablet 2 tabs po qd x 1d, then 1 tab po qd x 4d (Patient not taking: Reported on 10/13/2014) 6 tablet 0  . citalopram (CELEXA) 20 MG tablet TAKE 1 TABLET BY MOUTH EVERY DAY (Patient not taking: Reported on 10/13/2014) 30 tablet 1  . metFORMIN (GLUCOPHAGE) 1000 MG tablet TAKE 1 TABLET(S) BY MOUTH TWICE DAILY WITTH MEALS (Patient not taking: Reported on 10/13/2014) 60 tablet 2  . predniSONE (DELTASONE) 20 MG tablet 2 tabs po qd x 5d, then 1 tab po qd x 5d (Patient not taking: Reported on 10/13/2014) 15 tablet 0  . PROAIR HFA 108 (90 BASE)  MCG/ACT inhaler INHALE 2 PUFFS EVERY 6 HOURS AS NEEDED (Patient not taking: Reported on 10/13/2014) 8.5 g 2  . rOPINIRole (REQUIP) 0.5 MG tablet TAKE 1 TABLET (0.5 MG TOTAL) BY MOUTH DAILY. (Patient not taking: Reported on 10/13/2014) 30 tablet 6   No facility-administered medications prior to visit.    Allergies  Allergen Reactions  . Metoprolol     REACTION: " chest pain"  . Sulfonamide Derivatives     nausea    ROS As per HPI  PE: Blood pressure 131/85, pulse 89, temperature 98 F (36.7 C), temperature source Oral, resp. rate 16, height _0  (1.753 m), weight 261 lb (118.389 kg), last menstrual period 07/14/2010, SpO2 96 %. Gen: Alert, well appearing.  Patient is oriented to person, place, time, and situation. AFFECT: pleasant, lucid thought and speech. No further exam today.  LABS:  Lab Results  Component Value Date   TSH 1.00 05/07/2012   Lab Results  Component Value Date   WBC 7.4 05/19/2014   HGB 15.0 05/19/2014   HCT 43.4 05/19/2014   MCV 92.2 05/19/2014   PLT 315.0 05/19/2014   Lab Results  Component Value Date   CREATININE 0.69 05/19/2014   BUN 21 05/19/2014   NA 139 05/19/2014   K 4.4 05/19/2014   CL 102 05/19/2014   CO2 30 05/19/2014   Lab Results  Component Value Date   ALT 202* 05/19/2014   AST 100* 05/19/2014   ALKPHOS 42 05/19/2014   BILITOT 0.7 05/19/2014   Lab Results  Component Value Date   CHOL 182 05/19/2014   Lab Results  Component Value Date   HDL 33.00* 05/19/2014   Lab Results  Component Value Date   LDLCALC 102* 05/03/2010   Lab Results  Component Value Date   TRIG 208.0* 05/19/2014   Lab Results  Component Value Date   CHOLHDL 6 05/19/2014   Lab Results  Component Value Date   HGBA1C 6.8* 05/19/2014     IMPRESSION AND PLAN:  1) DM 2, needs testing supplies (freestyle freedom kit eRx'd today). HbA1c today. Feet exam next f/u visit. Reminded pt of need for diabetic retinopathy screening exam.  2) HTN: The  current medical regimen is effective;  continue present plan and medications. Lytes/cr today. Rx bp cuff.  3) Fatty liver, with elevated AST/ALT: check AST/ALt today.  4) Adult ADD: stable.  I printed rx's for dexedrine 10 mg 1 tab qAM today for this month, September, and October 2016.  Appropriate fill on/after date was noted on each rx.  5) Tobacco dependence: Chantix too confusing/difficult for  her to take. We'll try combo of wellbutrin XL 145m qd and nicotine patches (14 mg qd x 162mothen 7 mg qd x 1 mo, and use nicotine gum or lozenge prn breakthrough cravings). Spent 10 min today doing tob cessation counseling/discussion/treatment plan.  An After Visit Summary was printed and given to the patient.  FOLLOW UP: Return in about 3 months (around 01/13/2015) for routine chronic illness f/u.

## 2014-10-13 NOTE — Progress Notes (Signed)
Pre visit review using our clinic review tool, if applicable. No additional management support is needed unless otherwise documented below in the visit note. 

## 2014-10-15 ENCOUNTER — Encounter: Payer: Self-pay | Admitting: *Deleted

## 2014-11-13 ENCOUNTER — Other Ambulatory Visit: Payer: Self-pay | Admitting: Hematology and Oncology

## 2014-11-17 NOTE — Telephone Encounter (Signed)
Last OV 12/23/13.  Next OV 12/29/14.  Chart reviewed.

## 2014-12-14 ENCOUNTER — Other Ambulatory Visit: Payer: Self-pay | Admitting: Family Medicine

## 2014-12-14 NOTE — Telephone Encounter (Signed)
RF request for proair LOV: 10/13/14 Next ov: None Last written: unknown

## 2014-12-28 ENCOUNTER — Other Ambulatory Visit: Payer: Self-pay

## 2014-12-28 DIAGNOSIS — C50212 Malignant neoplasm of upper-inner quadrant of left female breast: Secondary | ICD-10-CM

## 2014-12-29 ENCOUNTER — Other Ambulatory Visit: Payer: Managed Care, Other (non HMO)

## 2014-12-29 ENCOUNTER — Ambulatory Visit: Payer: Managed Care, Other (non HMO) | Admitting: Hematology and Oncology

## 2014-12-29 ENCOUNTER — Other Ambulatory Visit: Payer: Self-pay

## 2014-12-30 ENCOUNTER — Telehealth: Payer: Self-pay | Admitting: Hematology and Oncology

## 2014-12-30 NOTE — Telephone Encounter (Signed)
Called and left a message with rescheduled appointments

## 2015-01-12 NOTE — Assessment & Plan Note (Signed)
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 Breast exam 01/13/15: Normal 2. Mammogram  No abnormalities. Postsurgical changes. Breast Density Category . I recommended that she get 3-D mammograms for surveillance. Discussed the differences between different breast density categories.

## 2015-01-13 ENCOUNTER — Telehealth: Payer: Self-pay | Admitting: Hematology and Oncology

## 2015-01-13 ENCOUNTER — Ambulatory Visit: Payer: Managed Care, Other (non HMO) | Admitting: Hematology and Oncology

## 2015-01-13 ENCOUNTER — Other Ambulatory Visit: Payer: Managed Care, Other (non HMO)

## 2015-01-13 NOTE — Telephone Encounter (Signed)
Patient called in to reschedule todays appointment

## 2015-01-15 ENCOUNTER — Other Ambulatory Visit (HOSPITAL_BASED_OUTPATIENT_CLINIC_OR_DEPARTMENT_OTHER): Payer: Managed Care, Other (non HMO)

## 2015-01-15 ENCOUNTER — Telehealth: Payer: Self-pay | Admitting: Hematology and Oncology

## 2015-01-15 ENCOUNTER — Ambulatory Visit (HOSPITAL_BASED_OUTPATIENT_CLINIC_OR_DEPARTMENT_OTHER): Payer: Managed Care, Other (non HMO) | Admitting: Hematology and Oncology

## 2015-01-15 ENCOUNTER — Encounter: Payer: Self-pay | Admitting: Hematology and Oncology

## 2015-01-15 VITALS — BP 124/71 | HR 88 | Temp 98.4°F | Resp 18 | Ht 69.0 in | Wt 254.7 lb

## 2015-01-15 DIAGNOSIS — C50212 Malignant neoplasm of upper-inner quadrant of left female breast: Secondary | ICD-10-CM

## 2015-01-15 DIAGNOSIS — R21 Rash and other nonspecific skin eruption: Secondary | ICD-10-CM | POA: Diagnosis not present

## 2015-01-15 DIAGNOSIS — Z170 Estrogen receptor positive status [ER+]: Secondary | ICD-10-CM | POA: Diagnosis not present

## 2015-01-15 LAB — CBC WITH DIFFERENTIAL/PLATELET
BASO%: 1 % (ref 0.0–2.0)
BASOS ABS: 0.1 10*3/uL (ref 0.0–0.1)
EOS ABS: 0.3 10*3/uL (ref 0.0–0.5)
EOS%: 3.8 % (ref 0.0–7.0)
HEMATOCRIT: 40.2 % (ref 34.8–46.6)
HEMOGLOBIN: 14 g/dL (ref 11.6–15.9)
LYMPH#: 3 10*3/uL (ref 0.9–3.3)
LYMPH%: 37.4 % (ref 14.0–49.7)
MCH: 31.8 pg (ref 25.1–34.0)
MCHC: 34.8 g/dL (ref 31.5–36.0)
MCV: 91.3 fL (ref 79.5–101.0)
MONO#: 0.6 10*3/uL (ref 0.1–0.9)
MONO%: 7.5 % (ref 0.0–14.0)
NEUT#: 4.1 10*3/uL (ref 1.5–6.5)
NEUT%: 50.3 % (ref 38.4–76.8)
PLATELETS: 277 10*3/uL (ref 145–400)
RBC: 4.4 10*6/uL (ref 3.70–5.45)
RDW: 12.8 % (ref 11.2–14.5)
WBC: 8.1 10*3/uL (ref 3.9–10.3)

## 2015-01-15 LAB — COMPREHENSIVE METABOLIC PANEL (CC13)
ALT: 153 U/L — ABNORMAL HIGH (ref 0–55)
ANION GAP: 11 meq/L (ref 3–11)
AST: 64 U/L — ABNORMAL HIGH (ref 5–34)
Albumin: 4.1 g/dL (ref 3.5–5.0)
Alkaline Phosphatase: 42 U/L (ref 40–150)
BUN: 17.2 mg/dL (ref 7.0–26.0)
CALCIUM: 10.7 mg/dL — AB (ref 8.4–10.4)
CHLORIDE: 103 meq/L (ref 98–109)
CO2: 27 mEq/L (ref 22–29)
Creatinine: 0.8 mg/dL (ref 0.6–1.1)
EGFR: 88 mL/min/{1.73_m2} — ABNORMAL LOW (ref >90)
Glucose: 153 mg/dl — ABNORMAL HIGH (ref 70–140)
POTASSIUM: 3.6 meq/L (ref 3.5–5.1)
Sodium: 141 mEq/L (ref 136–145)
Total Bilirubin: 0.57 mg/dL (ref 0.20–1.20)
Total Protein: 7.4 g/dL (ref 6.4–8.3)

## 2015-01-15 NOTE — Assessment & Plan Note (Signed)
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  Arimidex counseling: Patient is tolerating Arimidex extremely well without any major problems. I recommended that she undergo a bone density test. She would like to do this the gynecologist office and I ordered the bone density to be scheduled at their office.  Breast Cancer Surveillance: 1. Breast exam 01/15/15: Normal 2. Mammogram  RTC in 1 year

## 2015-01-15 NOTE — Progress Notes (Signed)
Patient Care Team: Tammi Sou, MD as PCP - General (Family Medicine) Eston Esters, MD as Consulting Physician (Hematology and Oncology) Marylynn Pearson, MD as Consulting Physician (Ophthalmology) Kennith Center, RD as Dietitian (Family Medicine) Alda Berthold, DO as Consulting Physician (Neurology)  DIAGNOSIS: Breast cancer of upper-inner quadrant of left female breast [facility])   Staging form: Breast, AJCC 7th Edition     Clinical: No stage assigned - Unsigned     Pathologic: Stage IA (T1b, N0, cM0) - Signed by Rulon Eisenmenger, MD on 12/23/2013   SUMMARY OF ONCOLOGIC HISTORY:   Breast cancer of upper-inner quadrant of left female breast ([place])   05/24/2010 Initial Biopsy Invasive ductal carcinoma with DCIS extra was 80 minutes and, lymphovascular invasion identified   05/29/2010 Breast MRI Mass and masslike enhancement in the upper outer quadrant of left breast 8.3 x 5 x 3.6 cm extending up the pectoralis muscle, lymph node left axilla 1.9 cm   06/21/2010 Surgery Left mastectomy 2 foci of IDC 0.9 cm and 0.8 cm grade 1 with high-grade DCIS, angiolymphatic invasion present 3 SLN negative, ER 78%, PR 0%, Ki-67 25%, HER-2 negative, Oncotype DX recurrence score 23 (15% ROR)   07/12/2010 Surgery Hysterectomy and bilateral salpingo-oophorectomy for ovarian cyst   08/02/2010 - 10/21/2013 Chemotherapy Taxotere Cytoxan x4   10/24/2010 -  Anti-estrogen oral therapy Arimidex 1 mg daily 5 years treatment is the plan    CHIEF COMPLIANT: Follow-up on Arimidex.  INTERVAL HISTORY: [name] is a 49 year old with above-mentioned history of left-sided breast cancer treated with mastectomy followed by adjuvant chemotherapy with Taxotere and Cytoxan and is currently on Arimidex. She is tolerating it fairly well. She has a rash on the palms and the web area on both her hands. She initially thought was related to chemicals at work. But in spite of changing work she still gets a once a month. Is accompanied by profound  itching that lasts for about 1 hour and goes away. She has contractures so much that there is some dry eczematous changes on both her hands. She also has profound neuropathy for which Neurontin has been working very well.  REVIEW OF SYSTEMS:   Constitutional: Denies fevers, chills or abnormal weight loss Eyes: Denies blurriness of vision Ears, nose, mouth, throat, and face: Denies mucositis or sore throat Respiratory: Denies cough, dyspnea or wheezes Cardiovascular: Denies palpitation, chest discomfort or lower extremity swelling Gastrointestinal:  Denies nausea, heartburn or change in bowel habits Skin: Rash on the webs of her thumbs once a month Lymphatics: Denies new lymphadenopathy or easy bruising Neurological:Denies numbness, tingling or new weaknesses Behavioral/Psych: Mood is stable, no new changes  Breast:  denies any pain or lumps or nodules in either breasts All other systems were reviewed with the patient and are negative.  I have reviewed the past medical history, past surgical history, social history and family history with the patient and they are unchanged from previous note.  ALLERGIES:  is allergic to metoprolol and sulfonamide derivatives.  MEDICATIONS:  Current Outpatient Prescriptions  Medication Sig Dispense Refill  . albuterol (PROAIR HFA) 108 (90 BASE) MCG/ACT inhaler Inhale 1 puff into the lungs every 6 (six) hours as needed for wheezing or shortness of breath.    . anastrozole (ARIMIDEX) 1 MG tablet TAKE 1 TABLET (1 MG TOTAL) BY MOUTH DAILY. 90 tablet 0  . Blood Glucose Monitoring Suppl (FREESTYLE FREEDOM) KIT Check glucose once daily 1 each 0  . buPROPion (WELLBUTRIN XL) 150 MG 24  hr tablet Take 1 tablet (150 mg total) by mouth daily. 30 tablet 4  . cetirizine (ZYRTEC) 10 MG tablet Take 10 mg by mouth daily.      . cholecalciferol (VITAMIN D) 1000 UNITS tablet Take 1,000 Units by mouth daily.    . citalopram (CELEXA) 20 MG tablet TAKE 1 TABLET BY MOUTH EVERY  DAY 30 tablet 11  . dextroamphetamine (DEXEDRINE SPANSULE) 10 MG 24 hr capsule Take 1 capsule (10 mg total) by mouth daily. 30 capsule 0  . gabapentin (NEURONTIN) 300 MG capsule TAKE 2 CAPSULES (600 MG TOTAL) BY MOUTH 2 (TWO) TIMES DAILY. 120 capsule 6  . ibuprofen (ADVIL,MOTRIN) 200 MG tablet Take 200 mg by mouth as needed for fever or moderate pain.     . metFORMIN (GLUCOPHAGE) 1000 MG tablet TAKE 1 TABLET (1,000 MG TOTAL) BY MOUTH 2 (TWO) TIMES DAILY WITH A MEAL. 60 tablet 3  . Multiple Vitamin (MULTIVITAMIN) tablet Take 1 tablet by mouth daily.      Marland Kitchen PROAIR HFA 108 (90 BASE) MCG/ACT inhaler INHALE 2 PUFFS EVERY 6 HOURS AS NEEDED 8.5 g 2  . rOPINIRole (REQUIP) 0.5 MG tablet TAKE 1 TABLET (0.5 MG TOTAL) BY MOUTH DAILY. 30 tablet 6  . triamterene-hydrochlorothiazide (MAXZIDE-25) 37.5-25 MG per tablet TAKE 1/2 TABLET BY MOUTH EVERY DAY 45 tablet 1  . verapamil (CALAN) 120 MG tablet TAKE 0.5 TABLETS (60 MG TOTAL) BY MOUTH 2 (TWO) TIMES DAILY. 60 tablet 6   No current facility-administered medications for this visit.    PHYSICAL EXAMINATION: ECOG PERFORMANCE STATUS: 1 - Symptomatic but completely ambulatory  Filed Vitals:   01/15/15 0924  BP: 124/71  Pulse: 88  Temp: 98.4 F (36.9 C)  Resp: 18   Filed Weights   01/15/15 0924  Weight: 254 lb 11.2 oz (115.531 kg)    GENERAL:alert, no distress and comfortable SKIN: skin color, texture, turgor are normal, no rashes or significant lesions EYES: normal, Conjunctiva are pink and non-injected, sclera clear OROPHARYNX:no exudate, no erythema and lips, buccal mucosa, and tongue normal  NECK: supple, thyroid normal size, non-tender, without nodularity LYMPH:  no palpable lymphadenopathy in the cervical, axillary or inguinal LUNGS: clear to auscultation and percussion with normal breathing effort HEART: regular rate & rhythm and no murmurs and no lower extremity edema ABDOMEN:abdomen soft, non-tender and normal bowel  sounds Musculoskeletal:no cyanosis of digits and no clubbing  NEURO: alert & oriented x 3 with fluent speech, no focal motor/sensory deficits BREAST: No palpable masses or nodules in the right breast or left chest wall. Bilateral axilla without lymphadenopathy.. (exam performed in the presence of a chaperone)  LABORATORY DATA:  I have reviewed the data as listed   Chemistry      Component Value Date/Time   NA 141 01/15/2015 0911   NA 141 10/13/2014 0939   K 3.6 01/15/2015 0911   K 4.2 10/13/2014 0939   CL 102 10/13/2014 0939   CO2 27 01/15/2015 0911   CO2 30 10/13/2014 0939   BUN 17.2 01/15/2015 0911   BUN 13 10/13/2014 0939   CREATININE 0.8 01/15/2015 0911   CREATININE 0.57 10/13/2014 0939      Component Value Date/Time   CALCIUM 10.7* 01/15/2015 0911   CALCIUM 9.6 10/13/2014 0939   ALKPHOS 42 01/15/2015 0911   ALKPHOS 37* 10/13/2014 0939   AST 64* 01/15/2015 0911   AST 51* 10/13/2014 0939   ALT 153* 01/15/2015 0911   ALT 107* 10/13/2014 0939   BILITOT 0.57 01/15/2015 0911  BILITOT 0.5 10/13/2014 0939       Lab Results  Component Value Date   WBC 8.1 01/15/2015   HGB 14.0 01/15/2015   HCT 40.2 01/15/2015   MCV 91.3 01/15/2015   PLT 277 01/15/2015   NEUTROABS 4.1 01/15/2015   ASSESSMENT & PLAN:  Breast cancer of upper-inner quadrant of left female breast ([place]) 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  Arimidex counseling: Patient is tolerating Arimidex extremely well without any major problems. I recommended that she undergo a bone density test. She would like to do this the gynecologist office and I ordered the bone density to be scheduled at their office.  The results of MA 17 were discussed in detail The extension of treatment with an adjuvant  aromatase inhibitor to 10 years resulted in significantly higher rates of disease-free survival and a lower incidence of contralateral breast cancer than those with placebo, but the rate of overall survival was not higher with the aromatase inhibitor than with placebo.  Bone-related toxic effects occurred more frequently among patients receiving letrozole than among those receiving placebo, including a higher incidence of bone pain, bone fractures, and new-onset osteoporosis. I recommended that we send for breast cancer index which is a molecular test to help [place] determine if she would benefit from extended adjuvant antiestrogen therapy beyond 5 years.  Rash on the hands: Very dry skin the rash happens once every month and only last for about one hour. It is accompanied by a feeling of being bitten all over her body although there are no skin changes anywhere else. She has some eczema-like changes at the web of her hands bilaterally.  Breast Cancer Surveillance: 1. Breast exam 01/15/15: Normal 2. Mammogram: On the right breast will be done and of November. She had missed her mammogram appointment earlier.  Patient works for [employer] which keeps her extremely busy and it's like a constant exercise throughout the day. It appears that because of this her liver function tests had improved. The AST and ALT came down. I will call her with the results of BCI.   RTC in 1 year    No orders of the defined types were placed in this encounter.   The patient has a good understanding of the overall plan. she agrees with it. she will call with any problems that may develop before the next visit here.   Rulon Eisenmenger, MD 01/15/2015

## 2015-01-15 NOTE — Addendum Note (Signed)
Addended by: Prentiss Bells on: 01/15/2015 05:51 PM   Modules accepted: Medications

## 2015-01-15 NOTE — Telephone Encounter (Signed)
Gave adn printed appt sched and avs fo rpt for NOV 2017

## 2015-01-18 ENCOUNTER — Encounter: Payer: Self-pay | Admitting: *Deleted

## 2015-01-18 NOTE — Progress Notes (Signed)
Ordered BCI per Dr. Lindi Adie.  Faxed requisition to [facility] and informed pathology.

## 2015-02-07 ENCOUNTER — Other Ambulatory Visit: Payer: Self-pay | Admitting: Family Medicine

## 2015-02-08 NOTE — Telephone Encounter (Signed)
Left message for pt to call back  °

## 2015-02-08 NOTE — Telephone Encounter (Signed)
RF request for metformin LOV: 10/13/14 - due for f/u around 01/13/15 Next ov: None Last written: 08/27/14 #60 w/ 3RF  Will refill x 3 months. Needs ov for f/u before refills are out.

## 2015-02-09 NOTE — Telephone Encounter (Signed)
Pt advised and voiced understanding.   

## 2015-02-12 ENCOUNTER — Encounter: Payer: Self-pay | Admitting: Family Medicine

## 2015-02-12 ENCOUNTER — Ambulatory Visit (INDEPENDENT_AMBULATORY_CARE_PROVIDER_SITE_OTHER): Payer: Managed Care, Other (non HMO) | Admitting: Family Medicine

## 2015-02-12 DIAGNOSIS — N390 Urinary tract infection, site not specified: Secondary | ICD-10-CM

## 2015-02-12 DIAGNOSIS — R300 Dysuria: Secondary | ICD-10-CM

## 2015-02-12 LAB — POCT URINALYSIS DIPSTICK
Glucose, UA: NEGATIVE
KETONES UA: NEGATIVE
NITRITE UA: NEGATIVE
RBC UA: NEGATIVE
Spec Grav, UA: 1.025
Urobilinogen, UA: 0.2
pH, UA: 5.5

## 2015-02-12 MED ORDER — CEPHALEXIN 500 MG PO CAPS: 500.0000 mg | ORAL_CAPSULE | Freq: Four times a day (QID) | ORAL | Status: DC

## 2015-02-12 NOTE — Patient Instructions (Signed)
Urinary Tract Infection Urinary tract infections (UTIs) can develop anywhere along your urinary tract. Your urinary tract is your body's drainage system for removing wastes and extra water. Your urinary tract includes two kidneys, two ureters, a bladder, and a urethra. Your kidneys are a pair of bean-shaped organs. Each kidney is about the size of your fist. They are located below your ribs, one on each side of your spine. CAUSES Infections are caused by microbes, which are microscopic organisms, including fungi, viruses, and bacteria. These organisms are so small that they can only be seen through a microscope. Bacteria are the microbes that most commonly cause UTIs. SYMPTOMS  Symptoms of UTIs may vary by age and gender of the patient and by the location of the infection. Symptoms in young women typically include a frequent and intense urge to urinate and a painful, burning feeling in the bladder or urethra during urination. Older women and men are more likely to be tired, shaky, and weak and have muscle aches and abdominal pain. A fever may mean the infection is in your kidneys. Other symptoms of a kidney infection include pain in your back or sides below the ribs, nausea, and vomiting. DIAGNOSIS To diagnose a UTI, your caregiver will ask you about your symptoms. Your caregiver will also ask you to provide a urine sample. The urine sample will be tested for bacteria and white blood cells. White blood cells are made by your body to help fight infection. TREATMENT  Typically, UTIs can be treated with medication. Because most UTIs are caused by a bacterial infection, they usually can be treated with the use of antibiotics. The choice of antibiotic and length of treatment depend on your symptoms and the type of bacteria causing your infection. HOME CARE INSTRUCTIONS  If you were prescribed antibiotics, take them exactly as your caregiver instructs you. Finish the medication even if you feel better after  you have only taken some of the medication.  Drink enough water and fluids to keep your urine clear or pale yellow.  Avoid caffeine, tea, and carbonated beverages. They tend to irritate your bladder.  Empty your bladder often. Avoid holding urine for long periods of time.  Empty your bladder before and after sexual intercourse.  After a bowel movement, women should cleanse from front to back. Use each tissue only once. SEEK MEDICAL CARE IF:   You have back pain.  You develop a fever.  Your symptoms do not begin to resolve within 3 days. SEEK IMMEDIATE MEDICAL CARE IF:   You have severe back pain or lower abdominal pain.  You develop chills.  You have nausea or vomiting.  You have continued burning or discomfort with urination. MAKE SURE YOU:   Understand these instructions.  Will watch your condition.  Will get help right away if you are not doing well or get worse.   This information is not intended to replace advice given to you by your health care provider. Make sure you discuss any questions you have with your health care provider.   Document Released: 12/07/2004 Document Revised: 11/18/2014 Document Reviewed: 04/07/2011 Elsevier Interactive Patient Education [employer].  I have called in keflex for you to take 4x a day for 7 days,

## 2015-02-12 NOTE — Progress Notes (Signed)
   Subjective:    Patient ID: [name], female    DOB: [birth year], 49 y.o.   MRN: [id number]  HPI   Dysuria: Patient presents for an acute office visit secondary to dysuria. She states that a few months ago she noticed a small amount of blood in her urine, and she started taking cranberry juice in the changes in her urine result. She denied any fever, chills or dysuria at that time. Currently she states that 1 week ago she has noticed an increase in urinary frequency, mild suprapubic pressure and burning with urination. She hasn't noticed any blood in her urine. She has noticed a change in odor to her urine. Patient denies any nausea, vomit,  or rash. She does endorse intermittent diarrhea that she experiences frequently secondary to what she says is metformin. He has noticed an increase in fatigue with mild nausea. She denies low back pain. She states that she is on gabapentin and NSAIDs and feels that this may be masking some of her discomfort.   Past Medical History  Diagnosis Date  . Breast cancer ([place]) 2012    left mastectomy and chemotherapy  . ADD (attention deficit disorder with hyperactivity)   . Allergic rhinitis   . Mild persistent asthma     dx'd age 37 (Dr. Gwenette Greet) hx of noncompliance with controller therapy due to cost  . Hypertension   . Hyperlipidemia   . GERD (gastroesophageal reflux disease)   . Morbid obesity ([place])   . RLS (restless legs syndrome)   . OSA (obstructive sleep apnea)     Dr Gwenette Greet  . Depression   . Bowel obstruction ([place]) 06-25-2010    post mastectomy  . Ovarian mass     resected 07/12/10  . Type II or unspecified type diabetes mellitus without mention of complication, uncontrolled 05/15/2012  . Fatty liver 02/2010    u/s confirmed 02/2010 (hx of transaminitis)  . Neuropathy ([facility])    Allergies  Allergen Reactions  . Metoprolol     REACTION: " chest pain"  . Sulfonamide Derivatives     nausea   Review of Systems Negative, with the exception of  above mentioned in HPI     Objective:   Physical Exam BP 144/108 mmHg  Pulse 90  Temp(Src) 98.2 F (36.8 C) (Oral)  Resp 16  Ht 5' 9"  (1.753 m)  Wt 256 lb (116.121 kg)  BMI 37.79 kg/m2  LMP 07/14/2010 Gen: Afebrile. No acute distress. Nontoxic in appearance, well-developed, well-nourished, Caucasian female, obese. HENT: AT. [place].MMM. Eyes:Pupils Equal Round Reactive to light, Extraocular movements intact,  Conjunctiva without redness, discharge or icterus. CV: RRR  Chest: CTAB, no wheeze or crackles Abd: Soft. Obese. NTND. BS present. No Masses palpated.  MSK: No CVA tenderness bilaterally Neuro: Normal gait. PERLA. EOMi. Alert. Oriented x3    Assessment & Plan:  Dysuria/Urinary tract infection without hematuria, site unspecified - POCT urinalysis dipstick--> small bili, large leukocytes, negative nitrites, negative red blood cell. - Urinalysis with Reflex Microscopic, send for urinalysis with reflex secondary to bilirubin. - Culture, Urine, patient will be called to sensitivities are not to Keflex. - cephALEXin (KEFLEX) 500 MG capsule; Take 1 capsule (500 mg total) by mouth 4 (four) times daily.  Dispense: 28 capsule; Refill: 0  Follow-up in one week, sooner if needed or no improvement.

## 2015-02-12 NOTE — Progress Notes (Signed)
Pre visit review using our clinic review tool, if applicable. No additional management support is needed unless otherwise documented below in the visit note. 

## 2015-02-13 LAB — URINALYSIS, ROUTINE W REFLEX MICROSCOPIC
BILIRUBIN URINE: NEGATIVE
Glucose, UA: NEGATIVE
Hgb urine dipstick: NEGATIVE
NITRITE: NEGATIVE
Protein, ur: NEGATIVE
SPECIFIC GRAVITY, URINE: 1.021 (ref 1.001–1.035)
pH: 5 (ref 5.0–8.0)

## 2015-02-13 LAB — URINALYSIS, MICROSCOPIC ONLY
BACTERIA UA: NONE SEEN [HPF]
CRYSTALS: NONE SEEN [HPF]
SQUAMOUS EPITHELIAL / LPF: NONE SEEN [HPF] (ref <=5)
Yeast: NONE SEEN [HPF]

## 2015-02-14 LAB — URINE CULTURE: Colony Count: 50000

## 2015-05-04 ENCOUNTER — Other Ambulatory Visit: Payer: Self-pay | Admitting: Family Medicine

## 2015-05-05 NOTE — Telephone Encounter (Signed)
LOV: 10/13/14 NOV: None  RF request for gabapentin - please advise. Thanks.  Last written: 07/10/14 #120 w/ 6RF  RF request for triamterene/hctz - Rx sent.  Last written: 08/17/14 #45 w/ 1RF

## 2015-06-11 ENCOUNTER — Other Ambulatory Visit: Payer: Self-pay | Admitting: Family Medicine

## 2015-06-11 NOTE — Telephone Encounter (Signed)
LOV: 10/13/14 NOV: None  RF request for metfromin Last written: 02/08/15 #60 w/ 2RF  RF request for verapamil Last written: 09/23/14 #60 w/ 6RF

## 2015-06-22 ENCOUNTER — Encounter: Payer: Self-pay | Admitting: Family Medicine

## 2015-06-22 ENCOUNTER — Ambulatory Visit (INDEPENDENT_AMBULATORY_CARE_PROVIDER_SITE_OTHER): Payer: Managed Care, Other (non HMO) | Admitting: Family Medicine

## 2015-06-22 VITALS — BP 123/89 | HR 85 | Temp 98.4°F | Resp 20 | Wt 262.8 lb

## 2015-06-22 DIAGNOSIS — I889 Nonspecific lymphadenitis, unspecified: Secondary | ICD-10-CM

## 2015-06-22 DIAGNOSIS — J0100 Acute maxillary sinusitis, unspecified: Secondary | ICD-10-CM | POA: Diagnosis not present

## 2015-06-22 MED ORDER — AMOXICILLIN-POT CLAVULANATE 875-125 MG PO TABS: 1.0000 | ORAL_TABLET | Freq: Two times a day (BID) | ORAL | Status: DC

## 2015-06-22 MED ORDER — PREDNISONE 20 MG PO TABS: ORAL_TABLET | ORAL | Status: DC

## 2015-06-22 NOTE — Patient Instructions (Signed)
- Continue zyrtec, augmentin, prednisone prescribed.  - Rest, hydrate, mucinex, flonase, nasal saline.   Sinusitis, Adult Sinusitis is redness, soreness, and inflammation of the paranasal sinuses. Paranasal sinuses are air pockets within the bones of your face. They are located beneath your eyes, in the middle of your forehead, and above your eyes. In healthy paranasal sinuses, mucus is able to drain out, and air is able to circulate through them by way of your nose. However, when your paranasal sinuses are inflamed, mucus and air can become trapped. This can allow bacteria and other germs to grow and cause infection. Sinusitis can develop quickly and last only a short time (acute) or continue over a long period (chronic). Sinusitis that lasts for more than 12 weeks is considered chronic. CAUSES Causes of sinusitis include:  Allergies.  Structural abnormalities, such as displacement of the cartilage that separates your nostrils (deviated septum), which can decrease the air flow through your nose and sinuses and affect sinus drainage.  Functional abnormalities, such as when the small hairs (cilia) that line your sinuses and help remove mucus do not work properly or are not present. SIGNS AND SYMPTOMS Symptoms of acute and chronic sinusitis are the same. The primary symptoms are pain and pressure around the affected sinuses. Other symptoms include:  Upper toothache.  Earache.  Headache.  Bad breath.  Decreased sense of smell and taste.  A cough, which worsens when you are lying flat.  Fatigue.  Fever.  Thick drainage from your nose, which often is green and may contain pus (purulent).  Swelling and warmth over the affected sinuses. DIAGNOSIS Your health care provider will perform a physical exam. During your exam, your health care provider may perform any of the following to help determine if you have acute sinusitis or chronic sinusitis:  Look in your nose for signs of abnormal  growths in your nostrils (nasal polyps).  Tap over the affected sinus to check for signs of infection.  View the inside of your sinuses using an imaging device that has a light attached (endoscope). If your health care provider suspects that you have chronic sinusitis, one or more of the following tests may be recommended:  Allergy tests.  Nasal culture. A sample of mucus is taken from your nose, sent to a lab, and screened for bacteria.  Nasal cytology. A sample of mucus is taken from your nose and examined by your health care provider to determine if your sinusitis is related to an allergy. TREATMENT Most cases of acute sinusitis are related to a viral infection and will resolve on their own within 10 days. Sometimes, medicines are prescribed to help relieve symptoms of both acute and chronic sinusitis. These may include pain medicines, decongestants, nasal steroid sprays, or saline sprays. However, for sinusitis related to a bacterial infection, your health care provider will prescribe antibiotic medicines. These are medicines that will help kill the bacteria causing the infection. Rarely, sinusitis is caused by a fungal infection. In these cases, your health care provider will prescribe antifungal medicine. For some cases of chronic sinusitis, surgery is needed. Generally, these are cases in which sinusitis recurs more than 3 times per year, despite other treatments. HOME CARE INSTRUCTIONS  Drink plenty of water. Water helps thin the mucus so your sinuses can drain more easily.  Use a humidifier.  Inhale steam 3-4 times a day (for example, sit in the bathroom with the shower running).  Apply a warm, moist washcloth to your face 3-4 times a  day, or as directed by your health care provider.  Use saline nasal sprays to help moisten and clean your sinuses.  Take medicines only as directed by your health care provider.  If you were prescribed either an antibiotic or antifungal medicine,  finish it all even if you start to feel better. SEEK IMMEDIATE MEDICAL CARE IF:  You have increasing pain or severe headaches.  You have nausea, vomiting, or drowsiness.  You have swelling around your face.  You have vision problems.  You have a stiff neck.  You have difficulty breathing.   This information is not intended to replace advice given to you by your health care provider. Make sure you discuss any questions you have with your health care provider.   Document Released: 02/27/2005 Document Revised: 03/20/2014 Document Reviewed: 03/14/2011 Elsevier Interactive Patient Education [employer].

## 2015-06-22 NOTE — Progress Notes (Signed)
Patient ID: [name], female   DOB: [birth year], 50 y.o.   MRN: [id number]    [name] , [birth year], 50 y.o., female MRN: [id number]  CC: right ear pain  Subjective: Pt presents for an acute OV with complaints of right ear pain of 4 days duration. Associated symptoms include nasal congestion, swollen glands, cough, fever and nausea. Pt feels symptoms are progressively becoming worse.  Pt has tried advil and alk seltzer to ease their symptoms. She denies current sore throat. Hurts to swallow, but that is because of the swollen glands. Her daughter is also sick.h/o asthma, not needing inhaler more frequently. No wheezing. Using zyrtec.   Allergies  Allergen Reactions  . Metoprolol     REACTION: " chest pain"  . Sulfonamide Derivatives     nausea   Social History  Substance Use Topics  . Smoking status: Current Every Day Smoker -- 0.50 packs/day for 24 years  . Smokeless tobacco: Never Used  . Alcohol Use: No   Past Medical History  Diagnosis Date  . Breast cancer ([place]) 2012    left mastectomy and chemotherapy  . ADD (attention deficit disorder with hyperactivity)   . Allergic rhinitis   . Mild persistent asthma     dx'd age 60 (Dr. Gwenette Greet) hx of noncompliance with controller therapy due to cost  . Hypertension   . Hyperlipidemia   . GERD (gastroesophageal reflux disease)   . Morbid obesity ([place])   . RLS (restless legs syndrome)   . OSA (obstructive sleep apnea)     Dr Gwenette Greet  . Depression   . Bowel obstruction ([place]) 06-25-2010    post mastectomy  . Ovarian mass     resected 07/12/10  . Type II or unspecified type diabetes mellitus without mention of complication, uncontrolled 05/15/2012  . Fatty liver 02/2010    u/s confirmed 02/2010 (hx of transaminitis)  . Neuropathy [facility])    Past Surgical History  Procedure Laterality Date  . Mastectomy  06/21/2010    Left ; Dr [facility]  . Bilateral salpingoophorectomy  07/12/2010    enlarged ovary  . G 2 p 2      Family History  Problem Relation Age of Onset  . Adopted: Yes  . Healthy Son     94  . Healthy Daughter     106     Medication List       This list is accurate as of: 06/22/15  1:52 PM.  Always use your most recent med list.               anastrozole 1 MG tablet  Commonly known as:  ARIMIDEX  TAKE 1 TABLET (1 MG TOTAL) BY MOUTH DAILY.     buPROPion 150 MG 24 hr tablet  Commonly known as:  WELLBUTRIN XL  Take 1 tablet (150 mg total) by mouth daily.     cetirizine 10 MG tablet  Commonly known as:  ZYRTEC  Take 10 mg by mouth daily.     cholecalciferol 1000 units tablet  Commonly known as:  VITAMIN D  Take 1,000 Units by mouth daily. Reported on 06/22/2015     citalopram 20 MG tablet  Commonly known as:  CELEXA  TAKE 1 TABLET BY MOUTH EVERY DAY     dextroamphetamine 10 MG 24 hr capsule  Commonly known as:  DEXEDRINE SPANSULE  Take 1 capsule (10 mg total) by mouth daily.     FREESTYLE FREEDOM Kit  Check glucose once daily  gabapentin 300 MG capsule  Commonly known as:  NEURONTIN  TAKE 2 CAPSULES (600 MG TOTAL) BY MOUTH 2 (TWO) TIMES DAILY.     ibuprofen 200 MG tablet  Commonly known as:  ADVIL,MOTRIN  Take 200 mg by mouth as needed for fever or moderate pain.     metFORMIN 1000 MG tablet  Commonly known as:  GLUCOPHAGE  TAKE 1 TABLET (1,000 MG TOTAL) BY MOUTH 2 (TWO) TIMES DAILY WITH A MEAL.     multivitamin tablet  Take 1 tablet by mouth daily.     PROAIR HFA 108 (90 Base) MCG/ACT inhaler  Generic drug:  albuterol  Inhale 1 puff into the lungs every 6 (six) hours as needed for wheezing or shortness of breath.     rOPINIRole 0.5 MG tablet  Commonly known as:  REQUIP  TAKE 1 TABLET (0.5 MG TOTAL) BY MOUTH DAILY.     triamterene-hydrochlorothiazide 37.5-25 MG tablet  Commonly known as:  MAXZIDE-25  TAKE 1/2 TABLET BY MOUTH EVERY DAY     verapamil 120 MG tablet  Commonly known as:  CALAN  TAKE 0.5 TABLETS (60 MG TOTAL) BY MOUTH 2 (TWO) TIMES  DAILY.        ROS: Negative, with the exception of above mentioned in HPI  Objective:  BP 123/89 mmHg  Pulse 85  Temp(Src) 98.4 F (36.9 C)  Resp 20  Wt 262 lb 12 oz (119.183 kg)  SpO2 95%  LMP 07/14/2010 Body mass index is 38.78 kg/(m^2). Gen: Afebrile. No acute distress. Nontoxic in appearance. Well developed, well nourished pleasant female.  HENT: AT. [place]. Bilateral TM visualized, with air fluid levels bilaterally, no erythema or bulging. MMM, no oral lesions. Bilateral nares erythema and bogginess present. Throat without erythema or exudates. No cough or hoarseness on exam.  Eyes:Pupils Equal Round Reactive to light, Extraocular movements intact,  Conjunctiva without redness, discharge or icterus. Neck/lymp/endocrine: Supple, right anterior cervical lymphadenopathy, with large tender node.  CV: RRR  Chest: CTAB, no wheeze or crackles. Good air movement, normal resp effort.  Skin: No rashes, purpura or petechiae.  Neuro: Normal gait. PERLA. EOMi. Alert. Oriented x3   Assessment/Plan: [name] is a 50 y.o. female present for acute OV for  1. Acute maxillary sinusitis, recurrence not specified/lympadenitis - Continue zyrtec, augmentin, prednisone burst. - Rest, hydrate, mucinex, flonase, nasal saline.  - F/U PRN   electronically signed by:  Howard Pouch, DO  [place]

## 2015-07-23 ENCOUNTER — Emergency Department (HOSPITAL_COMMUNITY): Payer: Managed Care, Other (non HMO)

## 2015-07-23 ENCOUNTER — Encounter (HOSPITAL_COMMUNITY): Payer: Self-pay | Admitting: Emergency Medicine

## 2015-07-23 ENCOUNTER — Telehealth: Payer: Self-pay | Admitting: Family Medicine

## 2015-07-23 ENCOUNTER — Emergency Department (HOSPITAL_COMMUNITY)
Admission: EM | Admit: 2015-07-23 | Discharge: 2015-07-23 | Disposition: A | Discharge status: Home / Self Care | Payer: Managed Care, Other (non HMO) | Attending: Emergency Medicine | Admitting: Emergency Medicine

## 2015-07-23 DIAGNOSIS — Z79899 Other long term (current) drug therapy: Secondary | ICD-10-CM | POA: Diagnosis not present

## 2015-07-23 DIAGNOSIS — F17200 Nicotine dependence, unspecified, uncomplicated: Secondary | ICD-10-CM | POA: Diagnosis not present

## 2015-07-23 DIAGNOSIS — R0600 Dyspnea, unspecified: Secondary | ICD-10-CM | POA: Diagnosis not present

## 2015-07-23 DIAGNOSIS — E1140 Type 2 diabetes mellitus with diabetic neuropathy, unspecified: Secondary | ICD-10-CM | POA: Insufficient documentation

## 2015-07-23 DIAGNOSIS — Z7984 Long term (current) use of oral hypoglycemic drugs: Secondary | ICD-10-CM | POA: Insufficient documentation

## 2015-07-23 DIAGNOSIS — R7989 Other specified abnormal findings of blood chemistry: Secondary | ICD-10-CM | POA: Diagnosis not present

## 2015-07-23 DIAGNOSIS — R0602 Shortness of breath: Secondary | ICD-10-CM | POA: Diagnosis present

## 2015-07-23 DIAGNOSIS — R945 Abnormal results of liver function studies: Secondary | ICD-10-CM

## 2015-07-23 DIAGNOSIS — E785 Hyperlipidemia, unspecified: Secondary | ICD-10-CM | POA: Insufficient documentation

## 2015-07-23 DIAGNOSIS — R0789 Other chest pain: Secondary | ICD-10-CM | POA: Insufficient documentation

## 2015-07-23 DIAGNOSIS — I10 Essential (primary) hypertension: Secondary | ICD-10-CM | POA: Insufficient documentation

## 2015-07-23 DIAGNOSIS — Z7951 Long term (current) use of inhaled steroids: Secondary | ICD-10-CM | POA: Diagnosis not present

## 2015-07-23 DIAGNOSIS — F329 Major depressive disorder, single episode, unspecified: Secondary | ICD-10-CM | POA: Insufficient documentation

## 2015-07-23 DIAGNOSIS — Z853 Personal history of malignant neoplasm of breast: Secondary | ICD-10-CM | POA: Diagnosis not present

## 2015-07-23 LAB — CBC WITH DIFFERENTIAL/PLATELET
Basophils Absolute: 0.1 10*3/uL (ref 0.0–0.1)
Basophils Relative: 1 %
Eosinophils Absolute: 0.4 10*3/uL (ref 0.0–0.7)
Eosinophils Relative: 4 %
HCT: 42.1 % (ref 36.0–46.0)
Hemoglobin: 14.4 g/dL (ref 12.0–15.0)
Lymphocytes Relative: 43 %
Lymphs Abs: 3.8 10*3/uL (ref 0.7–4.0)
MCH: 31.6 pg (ref 26.0–34.0)
MCHC: 34.2 g/dL (ref 30.0–36.0)
MCV: 92.3 fL (ref 78.0–100.0)
Monocytes Absolute: 0.7 10*3/uL (ref 0.1–1.0)
Monocytes Relative: 8 %
Neutro Abs: 3.9 10*3/uL (ref 1.7–7.7)
Neutrophils Relative %: 44 %
Platelets: 293 10*3/uL (ref 150–400)
RBC: 4.56 MIL/uL (ref 3.87–5.11)
RDW: 12.8 % (ref 11.5–15.5)
WBC: 8.9 10*3/uL (ref 4.0–10.5)

## 2015-07-23 LAB — COMPREHENSIVE METABOLIC PANEL
ALT: 205 U/L — ABNORMAL HIGH (ref 14–54)
AST: 124 U/L — ABNORMAL HIGH (ref 15–41)
Albumin: 4.4 g/dL (ref 3.5–5.0)
Alkaline Phosphatase: 38 U/L (ref 38–126)
Anion gap: 12 (ref 5–15)
BUN: 27 mg/dL — ABNORMAL HIGH (ref 6–20)
CO2: 25 mmol/L (ref 22–32)
Calcium: 10.1 mg/dL (ref 8.9–10.3)
Chloride: 97 mmol/L — ABNORMAL LOW (ref 101–111)
Creatinine, Ser: 0.94 mg/dL (ref 0.44–1.00)
GFR calc Af Amer: >60 mL/min (ref >60)
GFR calc non Af Amer: >60 mL/min (ref >60)
Glucose, Bld: 133 mg/dL — ABNORMAL HIGH (ref 65–99)
Potassium: 3.4 mmol/L — ABNORMAL LOW (ref 3.5–5.1)
Sodium: 134 mmol/L — ABNORMAL LOW (ref 135–145)
Total Bilirubin: 0.7 mg/dL (ref 0.3–1.2)
Total Protein: 8.2 g/dL — ABNORMAL HIGH (ref 6.5–8.1)

## 2015-07-23 LAB — BRAIN NATRIURETIC PEPTIDE: B Natriuretic Peptide: 9 pg/mL (ref 0.0–100.0)

## 2015-07-23 LAB — D-DIMER, QUANTITATIVE: D-Dimer, Quant: 0.4 ug/mL-FEU (ref 0.00–0.50)

## 2015-07-23 LAB — TROPONIN I: Troponin I: <0.03 ng/mL (ref <0.031)

## 2015-07-23 MED ORDER — IOPAMIDOL (ISOVUE-370) INJECTION 76%
100.0000 mL | Freq: Once | INTRAVENOUS | Status: AC | PRN
  Administered 2015-07-23: 100 mL via INTRAVENOUS

## 2015-07-23 NOTE — Discharge Instructions (Signed)
Shortness of Breath There is no evidence of heart attack or blood clot in the lung. Follow-up with your doctor for a stress test. You also should have an ultrasound of her gallbladder because of your elevated liver function. Return to the ED if you develop worsening shortness of breath, chest pain or any other concerns. Shortness of breath means you have trouble breathing. It could also mean that you have a medical problem. You should get immediate medical care for shortness of breath. CAUSES   Not enough oxygen in the air such as with high altitudes or a smoke-filled room.  Certain lung diseases, infections, or problems.  Heart disease or conditions, such as angina or heart failure.  Low red blood cells (anemia).  Poor physical fitness, which can cause shortness of breath when you exercise.  Chest or back injuries or stiffness.  Being overweight.  Smoking.  Anxiety, which can make you feel like you are not getting enough air. DIAGNOSIS  Serious medical problems can often be found during your physical exam. Tests may also be done to determine why you are having shortness of breath. Tests may include:  Chest X-rays.  Lung function tests.  Blood tests.  An electrocardiogram (ECG).  An ambulatory electrocardiogram. An ambulatory ECG records your heartbeat patterns over a 24-hour period.  Exercise testing.  A transthoracic echocardiogram (TTE). During echocardiography, sound waves are used to evaluate how blood flows through your heart.  A transesophageal echocardiogram (TEE).  Imaging scans. Your health care provider may not be able to find a cause for your shortness of breath after your exam. In this case, it is important to have a follow-up exam with your health care provider as directed.  TREATMENT  Treatment for shortness of breath depends on the cause of your symptoms and can vary greatly. HOME CARE INSTRUCTIONS   Do not smoke. Smoking is a common cause of shortness  of breath. If you smoke, ask for help to quit.  Avoid being around chemicals or things that may bother your breathing, such as paint fumes and dust.  Rest as needed. Slowly resume your usual activities.  If medicines were prescribed, take them as directed for the full length of time directed. This includes oxygen and any inhaled medicines.  Keep all follow-up appointments as directed by your health care provider. SEEK MEDICAL CARE IF:   Your condition does not improve in the time expected.  You have a hard time doing your normal activities even with rest.  You have any new symptoms. SEEK IMMEDIATE MEDICAL CARE IF:   Your shortness of breath gets worse.  You feel light-headed, faint, or develop a cough not controlled with medicines.  You start coughing up blood.  You have pain with breathing.  You have chest pain or pain in your arms, shoulders, or abdomen.  You have a fever.  You are unable to walk up stairs or exercise the way you normally do. MAKE SURE YOU:  Understand these instructions.  Will watch your condition.  Will get help right away if you are not doing well or get worse.   This information is not intended to replace advice given to you by your health care provider. Make sure you discuss any questions you have with your health care provider.   Document Released: 11/22/2000 Document Revised: 03/04/2013 Document Reviewed: 05/15/2011 Elsevier Interactive Patient Education [employer].

## 2015-07-23 NOTE — ED Provider Notes (Signed)
CSN: [id number]     Arrival date & time 07/23/15  7673 History  By signing my name below, I, Sonum Patel, attest that this documentation has been prepared under the direction and in the presence of Ezequiel Essex, MD. Electronically Signed: Sonum Patel, Scribe. 07/23/2015. 9:11 AM.    Chief Complaint  Patient presents with  . Shortness of Breath   The history is provided by the patient. No language interpreter was used.     HPI Comments: [name] is a 50 y.o. female with past medical history of CA, HTN, HLD, DM who presents to the Emergency Department complaining of intermittent episodes of gradually worsened SOB for the past 2 days. She states the SOB is unchanged with exertion. She reports an intermittent "weird feeling" to her central chest that lasts for a few seconds at a time. Denies chest pain. She reports having 1 year of intermittent left arm and hand paresthesias that occurs 1-2 times every few months. She reports a cough at baseline which she states is related to asthma. She denies fever, CP, diaphoretic, lightheadedness, vomiting, nausea, back pain, abdominal pain, HA, neck pain. She denies a history of a stress test. She is a 0.5 ppd smoker.   PCP [place]   Past Medical History  Diagnosis Date  . Breast cancer ([place]) 2012    left mastectomy and chemotherapy  . ADD (attention deficit disorder with hyperactivity)   . Allergic rhinitis   . Mild persistent asthma     dx'd age 73 (Dr. Gwenette Greet) hx of noncompliance with controller therapy due to cost  . Hypertension   . Hyperlipidemia   . GERD (gastroesophageal reflux disease)   . Morbid obesity ([place])   . RLS (restless legs syndrome)   . OSA (obstructive sleep apnea)     Dr Gwenette Greet  . Depression   . Bowel obstruction ([place]) 06-25-2010    post mastectomy  . Ovarian mass     resected 07/12/10  . Type II or unspecified type diabetes mellitus without mention of complication, uncontrolled 05/15/2012  . Fatty liver 02/2010    u/s  confirmed 02/2010 (hx of transaminitis)  . Neuropathy [facility])    Past Surgical History  Procedure Laterality Date  . Mastectomy  06/21/2010    Left ; Dr [facility]  . Bilateral salpingoophorectomy  07/12/2010    enlarged ovary  . G 2 p 2     Family History  Problem Relation Age of Onset  . Adopted: Yes  . Healthy Son     49  . Healthy Daughter     22   Social History  Substance Use Topics  . Smoking status: Current Every Day Smoker -- 0.50 packs/day for 24 years  . Smokeless tobacco: Never Used  . Alcohol Use: No   OB History    No data available     Review of Systems  A complete 10 system review of systems was obtained and all systems are negative except as noted in the HPI and PMH.    Allergies  Metoprolol and Sulfonamide derivatives  Home Medications   Prior to Admission medications   Medication Sig Start Date End Date Taking? Authorizing Provider  albuterol (PROAIR HFA) 108 (90 BASE) MCG/ACT inhaler Inhale 1 puff into the lungs every 6 (six) hours as needed for wheezing or shortness of breath.   Yes Historical Provider, MD  anastrozole (ARIMIDEX) 1 MG tablet TAKE 1 TABLET (1 MG TOTAL) BY MOUTH DAILY. 11/17/14  Yes Nicholas Lose, MD  cetirizine (ZYRTEC) 10 MG tablet Take 10 mg by mouth daily.     Yes Historical Provider, MD  citalopram (CELEXA) 20 MG tablet TAKE 1 TABLET BY MOUTH EVERY DAY 08/27/14  Yes Tammi Sou, MD  dextroamphetamine (DEXEDRINE SPANSULE) 10 MG 24 hr capsule Take 1 capsule (10 mg total) by mouth daily. 10/13/14  Yes Tammi Sou, MD  gabapentin (NEURONTIN) 300 MG capsule TAKE 2 CAPSULES (600 MG TOTAL) BY MOUTH 2 (TWO) TIMES DAILY. 05/06/15  Yes Tammi Sou, MD  ibuprofen (ADVIL,MOTRIN) 200 MG tablet Take 800 mg by mouth as needed for fever or moderate pain.    Yes Historical Provider, MD  metFORMIN (GLUCOPHAGE) 1000 MG tablet TAKE 1 TABLET (1,000 MG TOTAL) BY MOUTH 2 (TWO) TIMES DAILY WITH A MEAL. 06/11/15  Yes Tammi Sou, MD   Multiple Vitamin (MULTIVITAMIN) tablet Take 1 tablet by mouth daily.     Yes Historical Provider, MD  rOPINIRole (REQUIP) 0.5 MG tablet TAKE 1 TABLET (0.5 MG TOTAL) BY MOUTH DAILY. 09/23/14  Yes Tammi Sou, MD  tetrahydrozoline 0.05 % ophthalmic solution Place 2 drops into both eyes daily as needed (dry eyes).   Yes Historical Provider, MD  triamterene-hydrochlorothiazide (MAXZIDE-25) 37.5-25 MG tablet TAKE 1/2 TABLET BY MOUTH EVERY DAY 05/05/15  Yes Tammi Sou, MD  verapamil (CALAN) 120 MG tablet TAKE 0.5 TABLETS (60 MG TOTAL) BY MOUTH 2 (TWO) TIMES DAILY. 06/11/15  Yes Tammi Sou, MD  amoxicillin-clavulanate (AUGMENTIN) 875-125 MG tablet Take 1 tablet by mouth 2 (two) times daily. Patient not taking: Reported on 07/23/2015 06/22/15   [name] A Kuneff, DO  Blood Glucose Monitoring Suppl (FREESTYLE FREEDOM) KIT Check glucose once daily 10/13/14   Tammi Sou, MD  predniSONE (DELTASONE) 20 MG tablet 60 mg x3d, 40 mg x3d, 20 mg x2d, 10 mg x2d Patient not taking: Reported on 07/23/2015 06/22/15   [name] A Kuneff, DO   BP 120/82 mmHg  Pulse 93  Temp(Src) 98 F (36.7 C) (Oral)  Resp 21  Ht 5' 3"  (1.6 m)  Wt 260 lb (117.935 kg)  BMI 46.07 kg/m2  SpO2 96%  LMP 07/14/2010 Physical Exam  Constitutional: She is oriented to person, place, and time. She appears well-developed and well-nourished. No distress.  HENT:  Head: Normocephalic and atraumatic.  Mouth/Throat: Oropharynx is clear and moist. No oropharyngeal exudate.  Eyes: Conjunctivae and EOM are normal. Pupils are equal, round, and reactive to light.  Neck: Normal range of motion. Neck supple.  No meningismus.  Cardiovascular: Normal rate, regular rhythm, normal heart sounds and intact distal pulses.   No murmur heard. Equal pulses   Pulmonary/Chest: Effort normal and breath sounds normal. No respiratory distress. She exhibits no tenderness.  Abdominal: Soft. There is no tenderness. There is no rebound and no guarding.   Musculoskeletal: Normal range of motion. She exhibits no edema or tenderness.  Neurological: She is alert and oriented to person, place, and time. No cranial nerve deficit. She exhibits normal muscle tone. Coordination normal.  No ataxia on finger to nose bilaterally. No pronator drift. 5/5 strength throughout. CN 2-12 intact.Equal grip strength. Sensation intact.   Skin: Skin is warm.  Psychiatric: She has a normal mood and affect. Her behavior is normal.  Nursing note and vitals reviewed.   ED Course  Procedures (including critical care time)  DIAGNOSTIC STUDIES: Oxygen Saturation is 97% on RA, adequate by my interpretation.    COORDINATION OF CARE: 9:19 AM Discussed treatment plan with pt  at bedside and pt agreed to plan.  11:13 AM Updated patient on imaging and labs.    Labs Review Labs Reviewed  COMPREHENSIVE METABOLIC PANEL - Abnormal; Notable for the following:    Sodium 134 (*)    Potassium 3.4 (*)    Chloride 97 (*)    Glucose, Bld 133 (*)    BUN 27 (*)    Total Protein 8.2 (*)    AST 124 (*)    ALT 205 (*)    All other components within normal limits  CBC WITH DIFFERENTIAL/PLATELET  TROPONIN I  BRAIN NATRIURETIC PEPTIDE  D-DIMER, QUANTITATIVE (NOT AT [facility])  TROPONIN I    Imaging Review Dg Chest 2 View  07/23/2015  CLINICAL DATA:  Shortness of breath with tingling sensation in chest EXAM: CHEST  2 VIEW COMPARISON:  January 12, 2012 FINDINGS: There is no edema or consolidation. The heart size and pulmonary vascularity are normal. No adenopathy. There is degenerative change in the thoracic spine. No pneumothorax. IMPRESSION: No edema or consolidation. Electronically Signed   By: Lowella Grip III M.D.   On: 07/23/2015 09:47   Ct Angio Chest Pe W/cm &/or Wo Cm  07/23/2015  CLINICAL DATA:  Shortness of breath for 2 days. History of breast carcinoma EXAM: CT ANGIOGRAPHY CHEST WITH CONTRAST TECHNIQUE: Multidetector CT imaging of the chest was performed using the  standard protocol during bolus administration of intravenous contrast. Multiplanar CT image reconstructions and MIPs were obtained to evaluate the vascular anatomy. CONTRAST:  100 mL Isovue 370 nonionic COMPARISON:  Chest radiograph Jul 23, 2015 FINDINGS: Mediastinum/Lymph Nodes: There is no demonstrable pulmonary embolus. There is no thoracic aortic aneurysm or dissection. The pericardium is not thickened. The visualized great vessels appear unremarkable. Thyroid appears unremarkable. There are subcentimeter mediastinal lymph nodes. By size criteria, there is no adenopathy in the abdomen or pelvis. Lungs/Pleura: There is mild scarring in the lateral left base. There is no parenchymal lung edema or consolidation. Upper abdomen: There appears to be a degree of hepatic steatosis. Visualized upper abdominal structures otherwise appear unremarkable. Note that there is mild reflux of contrast into the inferior vena cava. Musculoskeletal: There is degenerative change in the thoracic spine. No blastic or lytic bone lesions are evident. Review of the MIP images confirms the above findings. IMPRESSION: No demonstrable pulmonary embolus. Mild scarring lateral left lung base. No parenchymal lung edema or consolidation. No adenopathy. Evidence a degree of hepatic steatosis. Electronically Signed   By: Lowella Grip III M.D.   On: 07/23/2015 14:35   I have personally reviewed and evaluated these images and lab results as part of my medical decision-making.   EKG Interpretation   Date/Time:  Friday Jul 23 2015 09:06:47 EDT Ventricular Rate:  93 PR Interval:  130 QRS Duration: 85 QT Interval:  358 QTC Calculation: 445 R Axis:   45 Text Interpretation:  Sinus rhythm RSR' in V1 or V2, right VCD or RVH No  significant change was found Confirmed by Wyvonnia Dusky  MD, Dashanti Burr [phone]) on  07/23/2015 9:09:31 AM      MDM   Final diagnoses:  Elevated LFTs  Atypical chest pain  Dyspnea  intermittent sOB x 2 days.  Intermittent "funny feeling in chest like worms" but denies pain.  No fever or cough. Remote history of cancer.   EKG nsr. Troponin negative. D-dimer negative. Mild LFT elevation similar to previous.  No RUQ pain. No fever. She states she has previous evaluation by liver specialist and declines [place]  today.  Troponin negative x2.  CT without PE.  Tolerating PO and ambulatory.   Low suspicion for ACS or PE.  Would benefit from outpatient stress test.  EKG nsr.  Follow up with PCP. Return precautions discussed.   I personally performed the services described in this documentation, which was scribed in my presence. The recorded information has been reviewed and is accurate.   Ezequiel Essex, MD 07/23/15 [phone]

## 2015-07-23 NOTE — Telephone Encounter (Signed)
Patient called to schedule an appointment for SOB, left arm pain, and chest pain.  I advised patient that she needs to be seen at ER due to her symptoms.

## 2015-07-23 NOTE — ED Notes (Signed)
Pt reports shortness of breath for last several days. Pt reports intermittent chest pain and "not feeling right" for last several days as well. Pt denies cp,n/v at this time. Pt also reports intermittent tingling in left arm x1 year. nad noted. Mild dyspnea noted with exertion.

## 2015-07-27 ENCOUNTER — Other Ambulatory Visit: Payer: Self-pay | Admitting: *Deleted

## 2015-07-27 NOTE — Telephone Encounter (Signed)
Pt called requesting refills. Please advise.Thanks.   RF request for requip LOV: 10/13/14 Next ov: None Last written: 09/23/14 #30 w/ 6RF

## 2015-07-28 MED ORDER — ROPINIROLE HCL 0.5 MG PO TABS: ORAL_TABLET | ORAL | Status: DC

## 2015-07-28 NOTE — Telephone Encounter (Signed)
LMOM for pt to CB.  

## 2015-07-28 NOTE — Telephone Encounter (Signed)
Will do #30 requip with 1 additional RF. Pt overdue for office f/u: needs 30 min appt in the next 1-2 mo.

## 2015-07-28 NOTE — Telephone Encounter (Signed)
Pt CB to schedule appt. 30 min OV schedule 07/26/15.

## 2015-08-05 ENCOUNTER — Ambulatory Visit: Payer: Managed Care, Other (non HMO) | Admitting: Family Medicine

## 2015-08-27 ENCOUNTER — Ambulatory Visit (INDEPENDENT_AMBULATORY_CARE_PROVIDER_SITE_OTHER): Payer: Managed Care, Other (non HMO) | Admitting: Family Medicine

## 2015-08-27 ENCOUNTER — Other Ambulatory Visit: Payer: Self-pay | Admitting: *Deleted

## 2015-08-27 ENCOUNTER — Encounter: Payer: Self-pay | Admitting: Family Medicine

## 2015-08-27 VITALS — BP 124/84 | HR 87 | Temp 98.5°F | Resp 20 | Wt 265.0 lb

## 2015-08-27 DIAGNOSIS — N390 Urinary tract infection, site not specified: Secondary | ICD-10-CM | POA: Diagnosis not present

## 2015-08-27 DIAGNOSIS — R350 Frequency of micturition: Secondary | ICD-10-CM | POA: Diagnosis not present

## 2015-08-27 DIAGNOSIS — R8290 Unspecified abnormal findings in urine: Secondary | ICD-10-CM | POA: Diagnosis not present

## 2015-08-27 LAB — POC URINALSYSI DIPSTICK (AUTOMATED)
Bilirubin, UA: NEGATIVE
GLUCOSE UA: NEGATIVE
KETONES UA: NEGATIVE
Nitrite, UA: NEGATIVE
Protein, UA: 100
SPEC GRAV UA: 1.02
Urobilinogen, UA: 0.2
pH, UA: 6

## 2015-08-27 MED ORDER — FLUCONAZOLE 150 MG PO TABS: 150.0000 mg | ORAL_TABLET | Freq: Every day | ORAL | Status: DC

## 2015-08-27 MED ORDER — CEPHALEXIN 500 MG PO CAPS: 500.0000 mg | ORAL_CAPSULE | Freq: Four times a day (QID) | ORAL | Status: DC

## 2015-08-27 MED ORDER — DEXTROAMPHETAMINE SULFATE ER 10 MG PO CP24: 10.0000 mg | ORAL_CAPSULE | Freq: Every day | ORAL | Status: DC

## 2015-08-27 NOTE — Patient Instructions (Signed)
I have called in antibiotic, take every 6 hours,. I have also called in diflucan.  You will need to follow if yeast is not resolved.

## 2015-08-27 NOTE — Progress Notes (Signed)
Patient ID: Rolinda Impson, female   DOB: Oct 16, 1965, 50 y.o.   MRN: [id number]    Maisee Vollman , [birth year], 50 y.o., female MRN: [id number]  CC: UTI symptoms  Subjective: Pt presents for an acute OV with complaints of dysuria of 1 week duration.  Associated symptoms include suprapubic pressure, urinary frequency, hematuria. Pt has tried advil, naproxen to ease their symptoms. No fever, chills or  low back pain. Prior UTI/Micro pan-sensitve E.Coli. Never had kidney stones. Sulphur allergy.   Allergies  Allergen Reactions  . Metoprolol     REACTION: " chest pain"  . Sulfonamide Derivatives     nausea   Social History  Substance Use Topics  . Smoking status: Current Every Day Smoker -- 0.50 packs/day for 24 years  . Smokeless tobacco: Never Used  . Alcohol Use: No   Past Medical History  Diagnosis Date  . Breast cancer ([place]) 2012    left mastectomy and chemotherapy  . ADD (attention deficit disorder with hyperactivity)   . Allergic rhinitis   . Mild persistent asthma     dx'd age 49 (Dr. Gwenette Greet) hx of noncompliance with controller therapy due to cost  . Hypertension   . Hyperlipidemia   . GERD (gastroesophageal reflux disease)   . Morbid obesity ([place])   . RLS (restless legs syndrome)   . OSA (obstructive sleep apnea)     Dr Gwenette Greet  . Depression   . Bowel obstruction ([place]) 06-25-2010    post mastectomy  . Ovarian mass     resected 07/12/10  . Type II or unspecified type diabetes mellitus without mention of complication, uncontrolled 05/15/2012  . Fatty liver 02/2010    u/s confirmed 02/2010 (hx of transaminitis)  . Neuropathy [facility])    Past Surgical History  Procedure Laterality Date  . Mastectomy  06/21/2010    Left ; Dr [facility]  . Bilateral salpingoophorectomy  07/12/2010    enlarged ovary  . G 2 p 2     Family History  Problem Relation Age of Onset  . Adopted: Yes  . Healthy Son     41  . Healthy Daughter     75     Medication List       This list  is accurate as of: 08/27/15  1:50 PM.  Always use your most recent med list.               anastrozole 1 MG tablet  Commonly known as:  ARIMIDEX  TAKE 1 TABLET (1 MG TOTAL) BY MOUTH DAILY.     cetirizine 10 MG tablet  Commonly known as:  ZYRTEC  Take 10 mg by mouth daily.     citalopram 20 MG tablet  Commonly known as:  CELEXA  TAKE 1 TABLET BY MOUTH EVERY DAY     dextroamphetamine 10 MG 24 hr capsule  Commonly known as:  DEXEDRINE SPANSULE  Take 1 capsule (10 mg total) by mouth daily.     FREESTYLE FREEDOM Kit  Check glucose once daily     gabapentin 300 MG capsule  Commonly known as:  NEURONTIN  TAKE 2 CAPSULES (600 MG TOTAL) BY MOUTH 2 (TWO) TIMES DAILY.     ibuprofen 200 MG tablet  Commonly known as:  ADVIL,MOTRIN  Take 800 mg by mouth as needed for fever or moderate pain.     metFORMIN 1000 MG tablet  Commonly known as:  GLUCOPHAGE  TAKE 1 TABLET (1,000 MG TOTAL) BY MOUTH 2 (TWO) TIMES DAILY  WITH A MEAL.     multivitamin tablet  Take 1 tablet by mouth daily.     PROAIR HFA 108 (90 Base) MCG/ACT inhaler  Generic drug:  albuterol  Inhale 1 puff into the lungs every 6 (six) hours as needed for wheezing or shortness of breath.     rOPINIRole 0.5 MG tablet  Commonly known as:  REQUIP  TAKE 1 TABLET (0.5 MG TOTAL) BY MOUTH DAILY.     tetrahydrozoline 0.05 % ophthalmic solution  Place 2 drops into both eyes daily as needed (dry eyes). Reported on 08/27/2015     triamterene-hydrochlorothiazide 37.5-25 MG tablet  Commonly known as:  MAXZIDE-25  TAKE 1/2 TABLET BY MOUTH EVERY DAY     verapamil 120 MG tablet  Commonly known as:  CALAN  TAKE 0.5 TABLETS (60 MG TOTAL) BY MOUTH 2 (TWO) TIMES DAILY.         ROS: Negative, with the exception of above mentioned in HPI  Results for orders placed or performed in visit on 08/27/15 (from the past 24 hour(s))  POCT Urinalysis Dipstick (Automated)     Status: Abnormal   Collection Time: 08/27/15  1:39 PM  Result Value  Ref Range   Color, UA orange    Clarity, UA cloudy    Glucose, UA negative    Bilirubin, UA negative    Ketones, UA negative    Spec Grav, UA 1.020    Blood, UA moderate    pH, UA 6.0    Protein, UA 100    Urobilinogen, UA 0.2    Nitrite, UA negative    Leukocytes, UA large (3+) (A) Negative    Objective:  BP 124/84 mmHg  Pulse 87  Temp(Src) 98.5 F (36.9 C)  Resp 20  Wt 265 lb (120.203 kg)  SpO2 98%  LMP 07/14/2010 Body mass index is 46.95 kg/(m^2). Gen: Afebrile. No acute distress. Nontoxic in appearance, morbidly obese, caucasian female.  HENT: AT. [place]. MMM, no oral lesions.  Eyes:Pupils Equal Round Reactive to light, Extraocular movements intact,  Conjunctiva without redness, discharge or icterus. CV: RRR  Chest: CTAB, no wheeze or crackles.   Abd: Soft. Morbidly obese. NTND. BS present.   MSK: No CVA tenderness Neuro: Normal gait. PERLA. EOMi. Alert. Oriented x3   Assessment/Plan: Amra Shukla is a 50 y.o. female present for acute OV for  Urinary frequency/ Urinary tract infection without hematuria, site unspecified - POCT Urinalysis Dipstick (Automated) - Urine Culture - cephALEXin (KEFLEX) 500 MG capsule; Take 1 capsule (500 mg total) by mouth 4 (four) times daily.  Dispense: 28 capsule; Refill: 0 - fluconazole (DIFLUCAN) 150 MG tablet; Take 1 tablet (150 mg total) by mouth daily.  Dispense: 1 tablet; Refill: 0 - discussed with pt she will need to follow either here or with her GYN if yeast is not resolved. Sounds like this is a chronic issue secondary to DM, obesity etc. However, explained to pt a pelvic is needed to confirm if not resolving. - F/U PRN  electronically signed by:  Howard Pouch, DO  [place]

## 2015-08-27 NOTE — Telephone Encounter (Signed)
I'll print 1 mo supply only. Pt is way overdue for f/u diabetes.

## 2015-08-27 NOTE — Telephone Encounter (Signed)
Pt in office today seeing Dr. Raoul Pitch for acute problem. She is requesting refill.   RF request for dextroamphetamin LOV: 10/13/14 Next ov: None Last written: 10/13/14 #30 w/ 4MH  Please advise. Thanks.

## 2015-08-29 LAB — URINE CULTURE
COLONY COUNT: NO GROWTH
ORGANISM ID, BACTERIA: NO GROWTH

## 2015-08-30 ENCOUNTER — Telehealth: Payer: Self-pay | Admitting: Family Medicine

## 2015-08-30 NOTE — Telephone Encounter (Signed)
Please call pt: - her urine culture did not show bacteria growth. Therefore, her signs of infection may be coming from the yeast (fungal). If your symptoms are not resolved with the diflucan than we may need to consider a longer treatment course, after exam/re-eval/pelvic/urine cytology.

## 2015-08-30 NOTE — Telephone Encounter (Signed)
Rx put up front for p/u. Left message on home phone stating Rx has been put up front at front desk for p/u.

## 2015-08-31 NOTE — Telephone Encounter (Signed)
Left message for patient to return call.

## 2015-09-01 NOTE — Telephone Encounter (Signed)
Left message for patient to return call.

## 2015-09-01 NOTE — Telephone Encounter (Signed)
Patient aware of results and she will call back if she is still having symptoms are diflucan treatment.

## 2015-09-02 ENCOUNTER — Other Ambulatory Visit: Payer: Self-pay | Admitting: *Deleted

## 2015-09-02 MED ORDER — ALBUTEROL SULFATE HFA 108 (90 BASE) MCG/ACT IN AERS: 1.0000 | INHALATION_SPRAY | Freq: Four times a day (QID) | RESPIRATORY_TRACT | Status: DC | PRN

## 2015-09-02 NOTE — Telephone Encounter (Signed)
RF request for proair LOV: 10/13/14 Next ov: 09/17/15 Last written: unknown

## 2015-09-08 ENCOUNTER — Other Ambulatory Visit: Payer: Self-pay | Admitting: *Deleted

## 2015-09-08 MED ORDER — CITALOPRAM HYDROBROMIDE 20 MG PO TABS: 20.0000 mg | ORAL_TABLET | Freq: Every day | ORAL | Status: DC

## 2015-09-08 NOTE — Telephone Encounter (Signed)
RF request for citalopram LOV: 10/13/14 Next ov: 09/17/15 Last written: 08/27/14 #30 w/ 11RF

## 2015-09-17 ENCOUNTER — Other Ambulatory Visit (HOSPITAL_COMMUNITY)
Admission: RE | Admit: 2015-09-17 | Discharge: 2015-09-17 | Disposition: A | Discharge status: Home / Self Care | Payer: Managed Care, Other (non HMO) | Source: Ambulatory Visit | Attending: Family Medicine | Admitting: Family Medicine

## 2015-09-17 ENCOUNTER — Encounter: Payer: Self-pay | Admitting: Family Medicine

## 2015-09-17 ENCOUNTER — Ambulatory Visit (INDEPENDENT_AMBULATORY_CARE_PROVIDER_SITE_OTHER): Payer: Managed Care, Other (non HMO) | Admitting: Family Medicine

## 2015-09-17 VITALS — BP 115/77 | HR 102 | Temp 98.3°F | Resp 16 | Ht 64.5 in | Wt 267.5 lb

## 2015-09-17 DIAGNOSIS — Z124 Encounter for screening for malignant neoplasm of cervix: Secondary | ICD-10-CM | POA: Diagnosis not present

## 2015-09-17 DIAGNOSIS — Z01419 Encounter for gynecological examination (general) (routine) without abnormal findings: Secondary | ICD-10-CM | POA: Diagnosis present

## 2015-09-17 DIAGNOSIS — Z0000 Encounter for general adult medical examination without abnormal findings: Secondary | ICD-10-CM | POA: Diagnosis not present

## 2015-09-17 DIAGNOSIS — Z1151 Encounter for screening for human papillomavirus (HPV): Secondary | ICD-10-CM | POA: Insufficient documentation

## 2015-09-17 DIAGNOSIS — R0789 Other chest pain: Secondary | ICD-10-CM

## 2015-09-17 DIAGNOSIS — E118 Type 2 diabetes mellitus with unspecified complications: Secondary | ICD-10-CM | POA: Diagnosis not present

## 2015-09-17 LAB — HM PAP SMEAR: HM Pap smear: NEGATIVE

## 2015-09-17 NOTE — Progress Notes (Signed)
Office Note 09/17/2015  CC:  Chief Complaint  Patient presents with  . Annual Exam    with PAP    HPI:  [name] is a 50 y.o. White female who is here for annual health maintenance exam with pelvic/pap. Interim hx: ED visit 07/23/15 for atypical CP, ruled out for ACS.  EDP commented that she would benefit from outpatient stress test.  Her most recent pap smear was normal: 10/23/12.  HPV testing was not done. She had a normal Pap on 05/03/10 as well. No hx of abnormal pap.   Past Medical History  Diagnosis Date  . Breast cancer ([place]) 2012    left mastectomy and chemotherapy  . ADD (attention deficit disorder with hyperactivity)   . Allergic rhinitis   . Mild persistent asthma     dx'd age 14 (Dr. Gwenette Greet) hx of noncompliance with controller therapy due to cost  . Hypertension   . Hyperlipidemia   . GERD (gastroesophageal reflux disease)   . Morbid obesity ([place])   . RLS (restless legs syndrome)   . OSA (obstructive sleep apnea)     Dr Gwenette Greet  . Depression   . Bowel obstruction ([place]) 06-25-2010    post mastectomy  . Ovarian mass     resected 07/12/10  . Type II or unspecified type diabetes mellitus without mention of complication, uncontrolled 05/15/2012  . NASH (nonalcoholic steatohepatitis) 02/2010    u/s confirmed 02/2010 (hx of transaminitis)  . Neuropathy [facility])     Past Surgical History  Procedure Laterality Date  . Mastectomy  06/21/2010    Left ; Dr [facility]  . Bilateral salpingoophorectomy  07/12/2010    enlarged ovary  . G 2 p 2      Family History  Problem Relation Age of Onset  . Adopted: Yes  . Healthy Son     104  . Healthy Daughter     72    Social History   Social History  . Marital Status: Married    Spouse Name: N/A  . Number of Children: 2  . Years of Education: N/A   Occupational History  . Jewelry maker    Social History Main Topics  . Smoking status: Current Every Day Smoker -- 0.50 packs/day for 24 years  . Smokeless  tobacco: Never Used  . Alcohol Use: No  . Drug Use: No  . Sexual Activity: Not on file     Comment: 1/2 pack a day   Other Topics Concern  . Not on file   Social History Narrative   She is working at [employer], previously worked as a Art therapist.   Lives with husband and two children   2-3 caffeine drinks daily           Outpatient Prescriptions Prior to Visit  Medication Sig Dispense Refill  . albuterol (PROAIR HFA) 108 (90 Base) MCG/ACT inhaler Inhale 1 puff into the lungs every 6 (six) hours as needed for wheezing or shortness of breath. 18 g 0  . anastrozole (ARIMIDEX) 1 MG tablet TAKE 1 TABLET (1 MG TOTAL) BY MOUTH DAILY. 90 tablet 0  . Blood Glucose Monitoring Suppl (FREESTYLE FREEDOM) KIT Check glucose once daily 1 each 0  . cetirizine (ZYRTEC) 10 MG tablet Take 10 mg by mouth daily.      . citalopram (CELEXA) 20 MG tablet Take 1 tablet (20 mg total) by mouth daily. 30 tablet 1  . dextroamphetamine (DEXEDRINE SPANSULE) 10 MG 24 hr capsule Take 1 capsule (  10 mg total) by mouth daily. 30 capsule 0  . gabapentin (NEURONTIN) 300 MG capsule TAKE 2 CAPSULES (600 MG TOTAL) BY MOUTH 2 (TWO) TIMES DAILY. 120 capsule 6  . ibuprofen (ADVIL,MOTRIN) 200 MG tablet Take 800 mg by mouth as needed for fever or moderate pain.     . metFORMIN (GLUCOPHAGE) 1000 MG tablet TAKE 1 TABLET (1,000 MG TOTAL) BY MOUTH 2 (TWO) TIMES DAILY WITH A MEAL. 60 tablet 6  . Multiple Vitamin (MULTIVITAMIN) tablet Take 1 tablet by mouth daily.      Marland Kitchen rOPINIRole (REQUIP) 0.5 MG tablet TAKE 1 TABLET (0.5 MG TOTAL) BY MOUTH DAILY. 30 tablet 1  . triamterene-hydrochlorothiazide (MAXZIDE-25) 37.5-25 MG tablet TAKE 1/2 TABLET BY MOUTH EVERY DAY 45 tablet 3  . verapamil (CALAN) 120 MG tablet TAKE 0.5 TABLETS (60 MG TOTAL) BY MOUTH 2 (TWO) TIMES DAILY. 90 tablet 6  . cephALEXin (KEFLEX) 500 MG capsule Take 1 capsule (500 mg total) by mouth 4 (four) times daily. (Patient not taking: Reported on 09/17/2015) 28 capsule 0   . fluconazole (DIFLUCAN) 150 MG tablet Take 1 tablet (150 mg total) by mouth daily. (Patient not taking: Reported on 09/17/2015) 1 tablet 0  . tetrahydrozoline 0.05 % ophthalmic solution Place 2 drops into both eyes daily as needed (dry eyes). Reported on 09/17/2015     No facility-administered medications prior to visit.    Allergies  Allergen Reactions  . Metoprolol     REACTION: " chest pain"  . Sulfonamide Derivatives     nausea    ROS Review of Systems  Constitutional: Negative for fever, chills, appetite change and fatigue.  HENT: Negative for congestion, dental problem, ear pain and sore throat.   Eyes: Negative for discharge, redness and visual disturbance.  Respiratory: Negative for cough, chest tightness, shortness of breath and wheezing.   Cardiovascular: Negative for chest pain, palpitations and leg swelling.  Gastrointestinal: Negative for nausea, vomiting, abdominal pain, diarrhea and blood in stool.  Genitourinary: Negative for dysuria, urgency, frequency, hematuria, flank pain and difficulty urinating.  Musculoskeletal: Negative for myalgias, back pain, joint swelling, arthralgias and neck stiffness.  Skin: Negative for pallor and rash.  Neurological: Negative for dizziness, speech difficulty, weakness and headaches.  Hematological: Negative for adenopathy. Does not bruise/bleed easily.  Psychiatric/Behavioral: Negative for confusion and sleep disturbance. The patient is not nervous/anxious.     PE; Blood pressure 115/77, pulse 102, temperature 98.3 F (36.8 C), temperature source Oral, resp. rate 16, height 5' 4.5" (1.638 m), weight 267 lb 8 oz (121.337 kg), last menstrual period 07/14/2010, SpO2 94 %. Gen: Alert, well appearing.  Patient is oriented to person, place, time, and situation. AFFECT: pleasant, lucid thought and speech. ENT: Ears: EACs clear, normal epithelium.  TMs with good light reflex and landmarks bilaterally.  Eyes: no injection, icteris, swelling,  or exudate.  EOMI, PERRLA. Nose: no drainage or turbinate edema/swelling.  No injection or focal lesion.  Mouth: lips without lesion/swelling.  Oral mucosa pink and moist.  Dentition intact and without obvious caries or gingival swelling.  Oropharynx without erythema, exudate, or swelling.  Neck: supple/nontender.  No LAD, mass, or TM.  Carotid pulses 2+ bilaterally, without bruits. CV: RRR, no m/r/g.   LUNGS: CTA bilat, nonlabored resps, good aeration in all lung fields. ABD: soft, NT, ND, BS normal.  No hepatospenomegaly or mass.  No bruits. EXT: no clubbing, cyanosis, or edema.  Musculoskeletal: no joint swelling, erythema, warmth, or tenderness.  ROM of all joints intact. Skin -  no sores or suspicious lesions or rashes or color changes Vagina and vulva are normal;  Some white discharge is noted.  Cervix normal without lesions. Uterus not palpable due to pt's body habitus. Pap Smear - is completed today. Exam chaperoned by female assistant Pt examined with Sharen Hones, CMA, as chaperone.   Pertinent labs:  Lab Results  Component Value Date   TSH 1.00 05/07/2012   Lab Results  Component Value Date   WBC 8.9 07/23/2015   HGB 14.4 07/23/2015   HCT 42.1 07/23/2015   MCV 92.3 07/23/2015   PLT 293 07/23/2015   Lab Results  Component Value Date   CREATININE 0.94 07/23/2015   BUN 27* 07/23/2015   NA 134* 07/23/2015   K 3.4* 07/23/2015   CL 97* 07/23/2015   CO2 25 07/23/2015   Lab Results  Component Value Date   ALT 205* 07/23/2015   AST 124* 07/23/2015   ALKPHOS 38 07/23/2015   BILITOT 0.7 07/23/2015   Lab Results  Component Value Date   CHOL 182 05/19/2014   Lab Results  Component Value Date   HDL 33.00* 05/19/2014   Lab Results  Component Value Date   LDLCALC 102* 05/03/2010   Lab Results  Component Value Date   TRIG 208.0* 05/19/2014   Lab Results  Component Value Date   CHOLHDL 6 05/19/2014    ASSESSMENT AND PLAN:   1) Health maintenance  exam: Reviewed age and gender appropriate health maintenance issues (prudent diet, regular exercise, health risks of tobacco and excessive alcohol, use of seatbelts, fire alarms in home, use of sunscreen).  Also reviewed age and gender appropriate health screening as well as vaccine recommendations. Fasting labs +HbA1c ordered--future b/c pt not fasting today. Pap smear + HPV testing done today. Pt is to arrange her mammogram at her convenience. Encouraged increased exercise and smoking cessation.  2) Atypical chest pain: given her risk factors, I do think a stress test is indicated. I have ordered a myocardial perfusion imaging test with exercise as her stress.  She has lots of chronic illness f/u to do, so we'll have her back in 50moto try to catch up on some of this, particularly a plan for f/u of her NASH (go back to Dr. SFuller Plan Repeat u/s?).  An After Visit Summary was printed and given to the patient.  FOLLOW UP:  Return in about 1 month (around 10/18/2015) for routine chronic illness f/u (30 min)--lab visit in the next 1 week (fasting).  Signed:  PCrissie Sickles MD           09/17/2015

## 2015-09-17 NOTE — Patient Instructions (Signed)
Arrange a mammogram at your convenience.

## 2015-09-17 NOTE — Progress Notes (Signed)
Pre visit review using our clinic review tool, if applicable. No additional management support is needed unless otherwise documented below in the visit note. 

## 2015-09-20 ENCOUNTER — Encounter: Payer: Self-pay | Admitting: Family Medicine

## 2015-09-20 ENCOUNTER — Other Ambulatory Visit: Payer: Managed Care, Other (non HMO)

## 2015-09-20 DIAGNOSIS — Z124 Encounter for screening for malignant neoplasm of cervix: Secondary | ICD-10-CM | POA: Insufficient documentation

## 2015-09-20 LAB — CYTOLOGY - PAP

## 2015-09-21 ENCOUNTER — Encounter: Payer: Self-pay | Admitting: Family Medicine

## 2015-09-23 ENCOUNTER — Other Ambulatory Visit (INDEPENDENT_AMBULATORY_CARE_PROVIDER_SITE_OTHER): Payer: Managed Care, Other (non HMO)

## 2015-09-23 DIAGNOSIS — E118 Type 2 diabetes mellitus with unspecified complications: Secondary | ICD-10-CM

## 2015-09-23 DIAGNOSIS — Z124 Encounter for screening for malignant neoplasm of cervix: Secondary | ICD-10-CM

## 2015-09-23 DIAGNOSIS — R7989 Other specified abnormal findings of blood chemistry: Secondary | ICD-10-CM | POA: Diagnosis not present

## 2015-09-23 DIAGNOSIS — Z0000 Encounter for general adult medical examination without abnormal findings: Secondary | ICD-10-CM

## 2015-09-23 LAB — TSH: TSH: 1.62 u[IU]/mL (ref 0.35–4.50)

## 2015-09-23 LAB — HEMOGLOBIN A1C: Hgb A1c MFr Bld: 6.3 % (ref 4.6–6.5)

## 2015-09-24 LAB — COMPREHENSIVE METABOLIC PANEL
ALBUMIN: 4.3 g/dL (ref 3.5–5.2)
ALT: 166 U/L — ABNORMAL HIGH (ref 0–35)
AST: 72 U/L — AB (ref 0–37)
Alkaline Phosphatase: 39 U/L (ref 39–117)
BILIRUBIN TOTAL: 0.3 mg/dL (ref 0.2–1.2)
BUN: 17 mg/dL (ref 6–23)
CALCIUM: 10.3 mg/dL (ref 8.4–10.5)
CO2: 26 mEq/L (ref 19–32)
CREATININE: 0.63 mg/dL (ref 0.40–1.20)
Chloride: 103 mEq/L (ref 96–112)
GFR: 106.68 mL/min (ref >60.00)
Glucose, Bld: 86 mg/dL (ref 70–99)
Potassium: 4.5 mEq/L (ref 3.5–5.1)
Sodium: 141 mEq/L (ref 135–145)
TOTAL PROTEIN: 7.4 g/dL (ref 6.0–8.3)

## 2015-09-24 LAB — LIPID PANEL
Cholesterol: 161 mg/dL (ref 0–200)
HDL: 29.9 mg/dL — ABNORMAL LOW (ref >39.00)
NonHDL: 131.32
Total CHOL/HDL Ratio: 5
Triglycerides: 271 mg/dL — ABNORMAL HIGH (ref 0.0–149.0)
VLDL: 54.2 mg/dL — ABNORMAL HIGH (ref 0.0–40.0)

## 2015-09-24 LAB — LDL CHOLESTEROL, DIRECT: LDL DIRECT: 103 mg/dL

## 2015-09-25 ENCOUNTER — Other Ambulatory Visit: Payer: Self-pay | Admitting: Family Medicine

## 2015-09-27 NOTE — Telephone Encounter (Signed)
RF request for ropiniroloe LOV: 09/17/15 Next ov: 10/14/15 Last written: 07/28/15 #30 w/ 1RF

## 2015-10-01 ENCOUNTER — Other Ambulatory Visit: Payer: Self-pay | Admitting: *Deleted

## 2015-10-01 DIAGNOSIS — K7581 Nonalcoholic steatohepatitis (NASH): Secondary | ICD-10-CM

## 2015-10-01 DIAGNOSIS — E785 Hyperlipidemia, unspecified: Secondary | ICD-10-CM

## 2015-10-01 MED ORDER — ATORVASTATIN CALCIUM 40 MG PO TABS: 40.0000 mg | ORAL_TABLET | Freq: Every day | ORAL | Status: DC

## 2015-10-14 ENCOUNTER — Ambulatory Visit (INDEPENDENT_AMBULATORY_CARE_PROVIDER_SITE_OTHER): Payer: Managed Care, Other (non HMO) | Admitting: Family Medicine

## 2015-10-14 ENCOUNTER — Encounter: Payer: Self-pay | Admitting: Family Medicine

## 2015-10-14 VITALS — BP 147/84 | HR 108 | Temp 98.5°F | Resp 16 | Ht 64.5 in | Wt 264.5 lb

## 2015-10-14 DIAGNOSIS — F418 Other specified anxiety disorders: Secondary | ICD-10-CM

## 2015-10-14 DIAGNOSIS — F32A Depression, unspecified: Secondary | ICD-10-CM

## 2015-10-14 DIAGNOSIS — K7581 Nonalcoholic steatohepatitis (NASH): Secondary | ICD-10-CM

## 2015-10-14 DIAGNOSIS — F419 Anxiety disorder, unspecified: Secondary | ICD-10-CM

## 2015-10-14 DIAGNOSIS — E785 Hyperlipidemia, unspecified: Secondary | ICD-10-CM | POA: Diagnosis not present

## 2015-10-14 DIAGNOSIS — F329 Major depressive disorder, single episode, unspecified: Secondary | ICD-10-CM

## 2015-10-14 MED ORDER — CITALOPRAM HYDROBROMIDE 40 MG PO TABS: 40.0000 mg | ORAL_TABLET | Freq: Every day | ORAL | 6 refills | Status: DC

## 2015-10-14 MED ORDER — DEXTROAMPHETAMINE SULFATE ER 10 MG PO CP24: 10.0000 mg | ORAL_CAPSULE | Freq: Every day | ORAL | 0 refills | Status: DC

## 2015-10-14 NOTE — Progress Notes (Signed)
OFFICE VISIT  10/14/2015   CC:  Chief Complaint  Patient presents with  . Follow-up    HPI:    Patient is a 50 y.o. Caucasian female who presents for 1 mo f/u hyperlipidemia. Started atorva 82m qd about 3 wks ago and feels no side effects.  She has NASH, no imaging has been done to follow this for at least 5 yrs. She is frustrated with her inability to get herself to diet.  Says "I just eat all the time". Eats even when not hungry, eats emotionally/anxiously.  She does not exercise. She is overwhelmed with life right now, feels like she may benefit from increase in antidepressant or change to new one.  She has been on wellbutrin for smoking cessation and it didn't seem to help with this but she can't recall if it helped w/depression.  Denies SI or HI.  No home bp monitoring being done. She is extremely stressed out, house getting foreclosed on.   Past Medical History:  Diagnosis Date  . ADD (attention deficit disorder with hyperactivity)   . Allergic rhinitis   . [place]   post mastectomy  . Breast [place]   left mastectomy and chemotherapy  . Depression   . GERD (gastroesophageal reflux disease)   . Hyperlipidemia   . Hypertension   . Mild persistent asthma    dx'd age 50(Dr. CGwenette Greet hx of noncompliance with controller therapy due to cost  . Morbid obesity ([place]   . NASH (nonalcoholic steatohepatitis) 02/2010   u/s confirmed 02/2010 (hx of transaminitis)  . Neuropathy ([place]   . OSA (obstructive sleep apnea)    Dr CGwenette Greet . Ovarian mass    resected 07/12/10  . RLS (restless legs syndrome)   . Type II or unspecified type diabetes mellitus without mention of complication, uncontrolled 05/15/2012    Past Surgical History:  Procedure Laterality Date  . BILATERAL SALPINGOOPHORECTOMY  07/12/2010   enlarged ovary  . G 2 P 2    . [facility]  [facility]21[facility]2010   [facility]   Outpatient Medications Prior to Visit   Medication Sig Dispense Refill  . albuterol (PROAIR HFA) 108 (90 Base) MCG/ACT inhaler Inhale 1 puff into the lungs every 6 (six) hours as needed for wheezing or shortness of breath. 18 g 0  . anastrozole (ARIMIDEX) 1 MG tablet TAKE 1 TABLET (1 MG TOTAL) BY MOUTH DAILY. 90 tablet 0  . atorvastatin (LIPITOR) 40 MG tablet Take 1 tablet (40 mg total) by mouth daily. 30 tablet 3  . Blood Glucose Monitoring Suppl (FREESTYLE FREEDOM) KIT Check glucose once daily 1 each 0  . cetirizine (ZYRTEC) 10 MG tablet Take 10 mg by mouth daily.      .Marland Kitchengabapentin (NEURONTIN) 300 MG capsule TAKE 2 CAPSULES (600 MG TOTAL) BY MOUTH 2 (TWO) TIMES DAILY. 120 capsule 6  . ibuprofen (ADVIL,MOTRIN) 200 MG tablet Take 800 mg by mouth as needed for fever or moderate pain.     . metFORMIN (GLUCOPHAGE) 1000 MG tablet TAKE 1 TABLET (1,000 MG TOTAL) BY MOUTH 2 (TWO) TIMES DAILY WITH A MEAL. 60 tablet 6  . Multiple Vitamin (MULTIVITAMIN) tablet Take 1 tablet by mouth daily.      .Marland KitchenrOPINIRole (REQUIP) 0.5 MG tablet TAKE ONE TABLET BY MOUTH DAILY 30 tablet 6  . triamterene-hydrochlorothiazide (MAXZIDE-25) 37.5-25 MG tablet TAKE 1/2 TABLET BY MOUTH EVERY DAY 45 tablet 3  . verapamil (CALAN) 120 MG tablet TAKE  0.5 TABLETS (60 MG TOTAL) BY MOUTH 2 (TWO) TIMES DAILY. 90 tablet 6  . citalopram (CELEXA) 20 MG tablet Take 1 tablet (20 mg total) by mouth daily. 30 tablet 1  . dextroamphetamine (DEXEDRINE SPANSULE) 10 MG 24 hr capsule Take 1 capsule (10 mg total) by mouth daily. 30 capsule 0   No facility-administered medications prior to visit.     Allergies  Allergen Reactions  . Metoprolol     REACTION: " chest pain"  . Sulfonamide Derivatives     nausea    ROS As per HPI  PE: Blood pressure (!) 147/84, pulse (!) 108, temperature 98.5 F (36.9 C), temperature source Oral, resp. rate 16, height 5' 4.5" (1.638 m), weight 264 lb 8 oz (120 kg), last menstrual period 07/14/2010, SpO2 95 %. BP recheck manually 110/70. Gen:  Alert, well appearing.  Patient is oriented to person, place, time, and situation. AFFECT: pleasant, lucid thought and speech. No further exam today.  LABS:  Lab Results  Component Value Date   HGBA1C 6.3 09/23/2015    Lab Results  Component Value Date   TSH 1.62 09/23/2015   Lab Results  Component Value Date   WBC 8.9 07/23/2015   HGB 14.4 07/23/2015   HCT 42.1 07/23/2015   MCV 92.3 07/23/2015   PLT 293 07/23/2015   Lab Results  Component Value Date   CREATININE 0.63 09/23/2015   BUN 17 09/23/2015   NA 141 09/23/2015   K 4.5 09/23/2015   CL 103 09/23/2015   CO2 26 09/23/2015   Lab Results  Component Value Date   ALT 166 (H) 09/23/2015   AST 72 (H) 09/23/2015   ALKPHOS 39 09/23/2015   BILITOT 0.3 09/23/2015   Lab Results  Component Value Date   CHOL 161 09/23/2015   Lab Results  Component Value Date   HDL 29.90 (L) 09/23/2015   Lab Results  Component Value Date   LDLCALC 102 (H) 05/03/2010   Lab Results  Component Value Date   TRIG 271.0 (H) 09/23/2015   Lab Results  Component Value Date   CHOLHDL 5 09/23/2015    IMPRESSION AND PLAN:  1) NASH: continue efforts at wt loss, continue statin to try to lower lipids, and we'll set up an abd u/s to monitor for any progression of her liver dz from an imaging standpoint.  If any sign of cirrhotic changes then I'll refer her to a gastroenterologist.  2) Depression/anxiety: increase citalopram to 51m qd.  3) Hyperlipidemia: tolerating atorvastatin.  Continue this and we'll recheck FLP and hepatic panel at next f/u in 2 mo.  An After Visit Summary was printed and given to the patient.  FOLLOW UP: Return in about 2 months (around 12/14/2015) for routine chronic illness f/u.  Signed:  PCrissie Sickles MD           10/14/2015

## 2015-10-14 NOTE — Progress Notes (Signed)
Pre visit review using our clinic review tool, if applicable. No additional management support is needed unless otherwise documented below in the visit note. 

## 2015-10-15 ENCOUNTER — Ambulatory Visit: Payer: Managed Care, Other (non HMO) | Admitting: Family Medicine

## 2015-10-21 ENCOUNTER — Telehealth: Payer: Self-pay | Admitting: Family Medicine

## 2015-10-21 NOTE — Telephone Encounter (Signed)
Copied over from staff message regarding my order for her abd ultrasound to follow her NASH:  Alexa Blue, MD  Cc: Marylu Lund Tomerlin; Vaughan Browner        We have left messages for Moria to call us back and schedule. No return phone call.   Thanks,  Hoyle Sauer

## 2015-10-27 ENCOUNTER — Ambulatory Visit (HOSPITAL_BASED_OUTPATIENT_CLINIC_OR_DEPARTMENT_OTHER): Payer: Managed Care, Other (non HMO)

## 2015-10-29 ENCOUNTER — Ambulatory Visit (HOSPITAL_BASED_OUTPATIENT_CLINIC_OR_DEPARTMENT_OTHER): Payer: Managed Care, Other (non HMO)

## 2015-12-17 ENCOUNTER — Ambulatory Visit: Payer: Managed Care, Other (non HMO) | Admitting: Family Medicine

## 2016-01-10 ENCOUNTER — Telehealth (HOSPITAL_COMMUNITY): Payer: Self-pay | Admitting: Family Medicine

## 2016-01-10 NOTE — Telephone Encounter (Signed)
Called pt and spoke with her to try to set up an appt and she voiced that she needed a 2pm , I informed her that we only did 2 day appts at 12:30 and 12:45 and she voiced that she would not be setting up and appt.. Pt had been contacted previously on 7/11 and 9/11. She will be removed from the workqueue.

## 2016-01-13 NOTE — Assessment & Plan Note (Deleted)
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  Arimidex counseling: Patient is tolerating Arimidex extremely well without any major problems. I recommended that she undergo a bone density test. She would like to do this the gynecologist office and I ordered the bone density to be scheduled at their office.  BCI: Breast Cancer Surveillance: 1. Breast exam 01/15/15: Normal 2. Mammogram: On the right breast will be done and of November. She had missed her mammogram appointment earlier.  Patient works for [employer] which keeps her extremely busy and it's like a constant exercise throughout the day. It appears that because of this her liver function tests had improved. The AST and ALT came down.  RTC 1 year

## 2016-01-14 ENCOUNTER — Ambulatory Visit: Payer: Managed Care, Other (non HMO) | Admitting: Hematology and Oncology

## 2016-01-14 NOTE — Progress Notes (Deleted)
Patient Care Team: Tammi Sou, MD as PCP - General (Family Medicine) Eston Esters, MD as Consulting Physician (Hematology and Oncology) Marylynn Pearson, MD as Consulting Physician (Ophthalmology) Kennith Center, RD as Dietitian (Family Medicine) Alda Berthold, DO as Consulting Physician (Neurology)  DIAGNOSIS:  Encounter Diagnosis  Name Primary?  . Malignant neoplasm of upper-inner quadrant of left breast in female, estrogen receptor positive ([place])     SUMMARY OF ONCOLOGIC HISTORY:   Breast cancer of upper-inner quadrant of left female breast ([place])   05/24/2010 Initial Biopsy    Invasive ductal carcinoma with DCIS extra was 80 minutes and, lymphovascular invasion identified      05/29/2010 Breast MRI    Mass and masslike enhancement in the upper outer quadrant of left breast 8.3 x 5 x 3.6 cm extending up the pectoralis muscle, lymph node left axilla 1.9 cm      06/21/2010 Surgery    Left mastectomy 2 foci of IDC 0.9 cm and 0.8 cm grade 1 with high-grade DCIS, angiolymphatic invasion present 3 SLN negative, ER 78%, PR 0%, Ki-67 25%, HER-2 negative, Oncotype DX recurrence score 23 (15% ROR)      07/12/2010 Surgery    Hysterectomy and bilateral salpingo-oophorectomy for ovarian cyst      08/02/2010 - 10/22/2010 Chemotherapy    Taxotere Cytoxan x4      10/24/2010 -  Anti-estrogen oral therapy    Arimidex 1 mg daily      01/22/2015 Procedure    Breast cancer index: Low risk category 2.7% years 5-10, She was found to have low likelihood of benefit from extended adjuvant therapy. She will stop Arimidex August 2017       CHIEF COMPLIANT: Follow-up after completion of anastrozole therapy  INTERVAL HISTORY: [name] is a 50 year old with above-mentioned history of left breast cancer treated with mastectomy followed by adjuvant chemotherapy with Taxotere and Cytoxan 4 and had taken Arimidex therapy since August 2012 and completed in August 2017. We had done breast cancer index  which showed that she had a low risk of recurrence and that she has a low likelihood of benefit from extended adjuvant therapy.  REVIEW OF SYSTEMS:   Constitutional: Denies fevers, chills or abnormal weight loss Eyes: Denies blurriness of vision Ears, nose, mouth, throat, and face: Denies mucositis or sore throat Respiratory: Denies cough, dyspnea or wheezes Cardiovascular: Denies palpitation, chest discomfort Gastrointestinal:  Denies nausea, heartburn or change in bowel habits Skin: Denies abnormal skin rashes Lymphatics: Denies new lymphadenopathy or easy bruising Neurological:Denies numbness, tingling or new weaknesses Behavioral/Psych: Mood is stable, no new changes  Extremities: No lower extremity edema Breast:  denies any pain or lumps or nodules in either breasts All other systems were reviewed with the patient and are negative.  I have reviewed the past medical history, past surgical history, social history and family history with the patient and they are unchanged from previous note.  ALLERGIES:  is allergic to metoprolol and sulfonamide derivatives.  MEDICATIONS:  Current Outpatient Prescriptions  Medication Sig Dispense Refill  . albuterol (PROAIR HFA) 108 (90 Base) MCG/ACT inhaler Inhale 1 puff into the lungs every 6 (six) hours as needed for wheezing or shortness of breath. 18 g 0  . anastrozole (ARIMIDEX) 1 MG tablet TAKE 1 TABLET (1 MG TOTAL) BY MOUTH DAILY. 90 tablet 0  . atorvastatin (LIPITOR) 40 MG tablet Take 1 tablet (40 mg total) by mouth daily. 30 tablet 3  . Blood Glucose Monitoring Suppl (FREESTYLE FREEDOM) KIT  Check glucose once daily 1 each 0  . cetirizine (ZYRTEC) 10 MG tablet Take 10 mg by mouth daily.      . citalopram (CELEXA) 40 MG tablet Take 1 tablet (40 mg total) by mouth daily. 30 tablet 6  . dextroamphetamine (DEXEDRINE SPANSULE) 10 MG 24 hr capsule Take 1 capsule (10 mg total) by mouth daily. 30 capsule 0  . gabapentin (NEURONTIN) 300 MG capsule  TAKE 2 CAPSULES (600 MG TOTAL) BY MOUTH 2 (TWO) TIMES DAILY. 120 capsule 6  . ibuprofen (ADVIL,MOTRIN) 200 MG tablet Take 800 mg by mouth as needed for fever or moderate pain.     . metFORMIN (GLUCOPHAGE) 1000 MG tablet TAKE 1 TABLET (1,000 MG TOTAL) BY MOUTH 2 (TWO) TIMES DAILY WITH A MEAL. 60 tablet 6  . Multiple Vitamin (MULTIVITAMIN) tablet Take 1 tablet by mouth daily.      Marland Kitchen rOPINIRole (REQUIP) 0.5 MG tablet TAKE ONE TABLET BY MOUTH DAILY 30 tablet 6  . triamterene-hydrochlorothiazide (MAXZIDE-25) 37.5-25 MG tablet TAKE 1/2 TABLET BY MOUTH EVERY DAY 45 tablet 3  . verapamil (CALAN) 120 MG tablet TAKE 0.5 TABLETS (60 MG TOTAL) BY MOUTH 2 (TWO) TIMES DAILY. 90 tablet 6   No current facility-administered medications for this visit.     PHYSICAL EXAMINATION: ECOG PERFORMANCE STATUS: 0 - Asymptomatic  There were no vitals filed for this visit. There were no vitals filed for this visit.  GENERAL:alert, no distress and comfortable SKIN: skin color, texture, turgor are normal, no rashes or significant lesions EYES: normal, Conjunctiva are pink and non-injected, sclera clear OROPHARYNX:no exudate, no erythema and lips, buccal mucosa, and tongue normal  NECK: supple, thyroid normal size, non-tender, without nodularity LYMPH:  no palpable lymphadenopathy in the cervical, axillary or inguinal LUNGS: clear to auscultation and percussion with normal breathing effort HEART: regular rate & rhythm and no murmurs and no lower extremity edema ABDOMEN:abdomen soft, non-tender and normal bowel sounds MUSCULOSKELETAL:no cyanosis of digits and no clubbing  NEURO: alert & oriented x 3 with fluent speech, no focal motor/sensory deficits EXTREMITIES: No lower extremity edema BREAST: No palpable masses or nodules in either right or left breasts. No palpable axillary supraclavicular or infraclavicular adenopathy no breast tenderness or nipple discharge. (exam performed in the presence of a  chaperone)  LABORATORY DATA:  I have reviewed the data as listed   Chemistry      Component Value Date/Time   NA 141 09/23/2015 1340   NA 141 01/15/2015 0911   K 4.5 09/23/2015 1340   K 3.6 01/15/2015 0911   CL 103 09/23/2015 1340   CO2 26 09/23/2015 1340   CO2 27 01/15/2015 0911   BUN 17 09/23/2015 1340   BUN 17.2 01/15/2015 0911   CREATININE 0.63 09/23/2015 1340   CREATININE 0.8 01/15/2015 0911      Component Value Date/Time   CALCIUM 10.3 09/23/2015 1340   CALCIUM 10.7 (H) 01/15/2015 0911   ALKPHOS 39 09/23/2015 1340   ALKPHOS 42 01/15/2015 0911   AST 72 (H) 09/23/2015 1340   AST 64 (H) 01/15/2015 0911   ALT 166 (H) 09/23/2015 1340   ALT 153 (H) 01/15/2015 0911   BILITOT 0.3 09/23/2015 1340   BILITOT 0.57 01/15/2015 0911       Lab Results  Component Value Date   WBC 8.9 07/23/2015   HGB 14.4 07/23/2015   HCT 42.1 07/23/2015   MCV 92.3 07/23/2015   PLT 293 07/23/2015   NEUTROABS 3.9 07/23/2015     ASSESSMENT &  PLAN:  Breast cancer of upper-inner quadrant of left female breast ([place]) Left breast invasive ductal carcinoma with DCIS T1 B. N0 M0 stage IA ER positive PR negative HER-2 negative Oncotype DX recurrence score 23 status post adjuvant chemotherapy Received Arimidex from 10/24/2010 until August 2017 after undergoing salpingo-oophorectomy.  Arimidex counseling: Patient Completed Arimidex therapy and she tolerated it extremely well  Patient gets bone density test at her gynecologist office  BCI: Low risk of recurrence, low likelihood of benefit from extended adjuvant therapy Because of the above result, we discontinued Arimidex therapy in August 2017.  Breast Cancer Surveillance: 1. Breast exam 01/15/15: Normal 2. Mammogram: On the right breast will be done and of November. She had missed her mammogram appointment earlier.  Patient works for [employer] which keeps her extremely busy and it's like a constant exercise throughout the day. It appears that  because of this her liver function tests had improved. The AST and ALT came down.  RTC 1 year With survivorship   No orders of the defined types were placed in this encounter.  The patient has a good understanding of the overall plan. she agrees with it. she will call with any problems that may develop before the next visit here.   Rulon Eisenmenger, MD 01/14/16

## 2016-02-17 ENCOUNTER — Other Ambulatory Visit: Payer: Self-pay | Admitting: Family Medicine

## 2016-02-17 MED ORDER — GABAPENTIN 300 MG PO CAPS: ORAL_CAPSULE | ORAL | 6 refills | Status: DC

## 2016-02-17 NOTE — Telephone Encounter (Signed)
LM for pt that RX was sent to pharmacy.

## 2016-02-17 NOTE — Telephone Encounter (Signed)
Patient called requesting RF of gabapentin.  Patient said she forgot and has let it run out.  Okay to refill for patient?

## 2016-03-13 DIAGNOSIS — K8020 Calculus of gallbladder without cholecystitis without obstruction: Secondary | ICD-10-CM

## 2016-03-13 HISTORY — DX: Calculus of gallbladder without cholecystitis without obstruction: K80.20

## 2016-03-16 ENCOUNTER — Other Ambulatory Visit: Payer: Self-pay | Admitting: *Deleted

## 2016-03-16 MED ORDER — METFORMIN HCL 1000 MG PO TABS: ORAL_TABLET | ORAL | 0 refills | Status: DC

## 2016-03-16 NOTE — Telephone Encounter (Signed)
Refill request from [facility].  RF request for metformin LOV: 10/14/15 Next ov: 03/22/16 Last written: 06/11/15 #60 w/ 6RF

## 2016-03-21 NOTE — Progress Notes (Signed)
Pre visit review using our clinic review tool, if applicable. No additional management support is needed unless otherwise documented below in the visit note. 

## 2016-03-22 ENCOUNTER — Encounter: Payer: Self-pay | Admitting: Family Medicine

## 2016-03-22 ENCOUNTER — Ambulatory Visit (INDEPENDENT_AMBULATORY_CARE_PROVIDER_SITE_OTHER): Payer: Managed Care, Other (non HMO) | Admitting: Family Medicine

## 2016-03-22 VITALS — BP 154/85 | HR 96 | Temp 98.0°F | Resp 16 | Ht 64.5 in | Wt 275.8 lb

## 2016-03-22 DIAGNOSIS — F329 Major depressive disorder, single episode, unspecified: Secondary | ICD-10-CM | POA: Diagnosis not present

## 2016-03-22 DIAGNOSIS — R0789 Other chest pain: Secondary | ICD-10-CM

## 2016-03-22 DIAGNOSIS — F909 Attention-deficit hyperactivity disorder, unspecified type: Secondary | ICD-10-CM | POA: Diagnosis not present

## 2016-03-22 DIAGNOSIS — M7711 Lateral epicondylitis, right elbow: Secondary | ICD-10-CM

## 2016-03-22 DIAGNOSIS — K7581 Nonalcoholic steatohepatitis (NASH): Secondary | ICD-10-CM

## 2016-03-22 DIAGNOSIS — G4733 Obstructive sleep apnea (adult) (pediatric): Secondary | ICD-10-CM | POA: Diagnosis not present

## 2016-03-22 MED ORDER — DEXTROAMPHETAMINE SULFATE ER 10 MG PO CP24: ORAL_CAPSULE | ORAL | 0 refills | Status: DC

## 2016-03-22 NOTE — Progress Notes (Signed)
OFFICE VISIT  03/22/2016   CC:  Chief Complaint  Patient presents with  . Follow-up    Sleep Apnea, has sleep study done 01/18/05, would like to have this re-done   HPI:    Patient is a 51 y.o. Caucasian female who presents for discussion of obstructive sleep apnea and mood. Was put on CPAP after getting a sleep study back in 2006, felt like it was too strong so stopped using it w/out following up with pulmonary. She doesn't think she has the machine anymore, not all of it anyway. She is feeling unrested upon awakening.  Excessive daytime sleepiness is present but ? Moderate. She is sleeping through her alarm, late for work, almost getting fired from [employer] and this has her feeling very down.  Poor concentration and focus.  Ran out of her ADHD med 3 wks ago, says she feels like it wasn't working as well the last few months.  She basically says she feels like she is falling apart lately.  Right elbow pain last several weeks, no injury prior.  Goes down extensor aspect of forearm sometimes. Hurts to grap things with R hand and lift up.   Past Medical History:  Diagnosis Date  . ADD (attention deficit disorder with hyperactivity)   . Allergic rhinitis   . Bowel obstruction 06-25-2010   post mastectomy  . Breast cancer ([place]) 2012   left mastectomy and chemotherapy  . Depression   . GERD (gastroesophageal reflux disease)   . Hyperlipidemia   . Hypertension   . Mild persistent asthma    dx'd age 5 (Dr. Gwenette Greet) hx of noncompliance with controller therapy due to cost  . Morbid obesity ([place])   . NASH (nonalcoholic steatohepatitis) 02/2010   u/s confirmed 02/2010 (hx of transaminitis)  . Neuropathy ([place])   . OSA (obstructive sleep apnea)    Dr Gwenette Greet  . Ovarian mass    resected 07/12/10  . RLS (restless legs syndrome)   . Type II or unspecified type diabetes mellitus without mention of complication, uncontrolled 05/15/2012    Past Surgical History:  Procedure Laterality Date  .  BILATERAL SALPINGOOPHORECTOMY  07/12/2010   enlarged ovary  . G 2 P 2    . MASTECTOMY  06/21/2010   Left ; [facility]    Outpatient Medications Prior to Visit  Medication Sig Dispense Refill  . albuterol (PROAIR HFA) 108 (90 Base) MCG/ACT inhaler Inhale 1 puff into the lungs every 6 (six) hours as needed for wheezing or shortness of breath. 18 g 0  . atorvastatin (LIPITOR) 40 MG tablet Take 1 tablet (40 mg total) by mouth daily. 30 tablet 3  . cetirizine (ZYRTEC) 10 MG tablet Take 10 mg by mouth daily.      . citalopram (CELEXA) 40 MG tablet Take 1 tablet (40 mg total) by mouth daily. 30 tablet 6  . gabapentin (NEURONTIN) 300 MG capsule TAKE 2 CAPSULES (600 MG TOTAL) BY MOUTH 2 (TWO) TIMES DAILY. 120 capsule 6  . ibuprofen (ADVIL,MOTRIN) 200 MG tablet Take 800 mg by mouth as needed for fever or moderate pain.     . metFORMIN (GLUCOPHAGE) 1000 MG tablet TAKE 1 TABLET (1,000 MG TOTAL) BY MOUTH 2 (TWO) TIMES DAILY WITH A MEAL. 60 tablet 0  . Multiple Vitamin (MULTIVITAMIN) tablet Take 1 tablet by mouth daily.      Marland Kitchen rOPINIRole (REQUIP) 0.5 MG tablet TAKE ONE TABLET BY MOUTH DAILY 30 tablet 6  . triamterene-hydrochlorothiazide (MAXZIDE-25) 37.5-25 MG tablet TAKE  1/2 TABLET BY MOUTH EVERY DAY 45 tablet 3  . verapamil (CALAN) 120 MG tablet TAKE 0.5 TABLETS (60 MG TOTAL) BY MOUTH 2 (TWO) TIMES DAILY. 90 tablet 6  . dextroamphetamine (DEXEDRINE SPANSULE) 10 MG 24 hr capsule Take 1 capsule (10 mg total) by mouth daily. 30 capsule 0  . anastrozole (ARIMIDEX) 1 MG tablet TAKE 1 TABLET (1 MG TOTAL) BY MOUTH DAILY. (Patient not taking: Reported on 03/22/2016) 90 tablet 0  . Blood Glucose Monitoring Suppl (FREESTYLE FREEDOM) KIT Check glucose once daily (Patient not taking: Reported on 03/22/2016) 1 each 0   No facility-administered medications prior to visit.     Allergies  Allergen Reactions  . Metoprolol     REACTION: " chest pain"  . Sulfonamide Derivatives     nausea    ROS As per  HPI  PE: Blood pressure (!) 154/85, pulse 96, temperature 98 F (36.7 C), temperature source Oral, resp. rate 16, height 5' 4.5" (1.638 m), weight 275 lb 12 oz (125.1 kg), last menstrual period 07/14/2010, SpO2 96 %. Gen: Alert, well appearing.  Patient is oriented to person, place, time, and situation. AFFECT: morose, but lucid thought and speech.  No crying. Right arm: TTP over lateral epicondyle.  Mild pain with resisted wrist extension.  No elbow swelling or erythema or warmth.  LABS:    Chemistry      Component Value Date/Time   NA 141 09/23/2015 1340   NA 141 01/15/2015 0911   K 4.5 09/23/2015 1340   K 3.6 01/15/2015 0911   CL 103 09/23/2015 1340   CO2 26 09/23/2015 1340   CO2 27 01/15/2015 0911   BUN 17 09/23/2015 1340   BUN 17.2 01/15/2015 0911   CREATININE 0.63 09/23/2015 1340   CREATININE 0.8 01/15/2015 0911      Component Value Date/Time   CALCIUM 10.3 09/23/2015 1340   CALCIUM 10.7 (H) 01/15/2015 0911   ALKPHOS 39 09/23/2015 1340   ALKPHOS 42 01/15/2015 0911   AST 72 (H) 09/23/2015 1340   AST 64 (H) 01/15/2015 0911   ALT 166 (H) 09/23/2015 1340   ALT 153 (H) 01/15/2015 0911   BILITOT 0.3 09/23/2015 1340   BILITOT 0.57 01/15/2015 0911     Lab Results  Component Value Date   WBC 8.9 07/23/2015   HGB 14.4 07/23/2015   HCT 42.1 07/23/2015   MCV 92.3 07/23/2015   PLT 293 07/23/2015   Lab Results  Component Value Date   HGBA1C 6.3 09/23/2015   IMPRESSION AND PLAN:  1) Excessive daytime somnolence, history of OSA.  Was intolerant of CPAP but admits she took no steps to get her initial intolerance rectified.  Will proceed with referral to neurology for further eval/sleep study.  2) Adult ADHD: not ideally controlled by dexedrine  24h cap 65m qd.  Increase to TWO of the 133mcaps qAM, rx for #60 given today.  Recheck this problem in 1 mo.  3) Depression/dysthymia; some of her recent worsening is situational depression.  I told her I don't think med change  will help this.  We'll continue with citalopram 40 mg qd.  4) Right arm lateral epicondylitis: discussed use of otc tennis elbow strap.    5) hx of atypical CP: tried to proceed with stress testing about 6 mo ago but pt did not follow through and schedule this.  She says she wants to proceed with this, so we'll get these things ordered again.  6) NASH: u/s 2011, planned for repeat  u/s 6 mo ago but pt didn't go.  Will order again.  An After Visit Summary was printed and given to the patient.  FOLLOW UP: Return in about 4 weeks (around 04/19/2016) for 30 min f/u dep/adhd/DM/HLD (fasting).  Signed:  Crissie Sickles, MD           03/22/2016

## 2016-03-23 ENCOUNTER — Telehealth (HOSPITAL_COMMUNITY): Payer: Self-pay | Admitting: Family Medicine

## 2016-03-23 ENCOUNTER — Telehealth: Payer: Self-pay

## 2016-03-23 ENCOUNTER — Encounter: Payer: Self-pay | Admitting: Family Medicine

## 2016-03-23 NOTE — Telephone Encounter (Signed)
Letter printed.

## 2016-03-23 NOTE — Telephone Encounter (Signed)
Patient requesting a letter for work explaining health problems that causes her to be late for work due to legitimate problems due to RLS, sleep apnea, etc.

## 2016-03-23 NOTE — Telephone Encounter (Signed)
03/23/16  Called pt and lmsg for her to CB to get scheduled for her myoview.     By Verdene Rio     03/23/16  Called back to Dr. Daryel Gerald office and spoke with Rhetta Mura to schedule her appt. She voiced that she would give the information to the patient.

## 2016-03-23 NOTE — Progress Notes (Signed)
A user error has taken place: encounter opened in error, closed for administrative reasons.

## 2016-03-24 ENCOUNTER — Ambulatory Visit (HOSPITAL_BASED_OUTPATIENT_CLINIC_OR_DEPARTMENT_OTHER)
Admission: RE | Admit: 2016-03-24 | Discharge: 2016-03-24 | Disposition: A | Discharge status: Home / Self Care | Payer: Managed Care, Other (non HMO) | Source: Ambulatory Visit | Attending: Family Medicine | Admitting: Family Medicine

## 2016-03-24 DIAGNOSIS — K8020 Calculus of gallbladder without cholecystitis without obstruction: Secondary | ICD-10-CM | POA: Diagnosis not present

## 2016-03-24 DIAGNOSIS — K7581 Nonalcoholic steatohepatitis (NASH): Secondary | ICD-10-CM

## 2016-03-24 NOTE — Telephone Encounter (Signed)
Message left on patients voice mail that letter she requested is completed and she can pick up at any time.

## 2016-03-26 ENCOUNTER — Encounter: Payer: Self-pay | Admitting: Family Medicine

## 2016-03-27 ENCOUNTER — Ambulatory Visit (HOSPITAL_COMMUNITY): Payer: Managed Care, Other (non HMO) | Attending: Cardiovascular Disease

## 2016-03-27 DIAGNOSIS — R9439 Abnormal result of other cardiovascular function study: Secondary | ICD-10-CM | POA: Insufficient documentation

## 2016-03-27 DIAGNOSIS — R0789 Other chest pain: Secondary | ICD-10-CM | POA: Diagnosis present

## 2016-03-27 MED ORDER — TECHNETIUM TC 99M TETROFOSMIN IV KIT
32.1000 | PACK | Freq: Once | INTRAVENOUS | Status: AC | PRN
  Administered 2016-03-27: 32.1 via INTRAVENOUS
  Filled 2016-03-27: qty 33

## 2016-03-28 ENCOUNTER — Ambulatory Visit (HOSPITAL_COMMUNITY): Payer: Managed Care, Other (non HMO)

## 2016-03-30 ENCOUNTER — Ambulatory Visit (HOSPITAL_COMMUNITY): Payer: Managed Care, Other (non HMO)

## 2016-04-04 ENCOUNTER — Encounter: Payer: Self-pay | Admitting: Family Medicine

## 2016-04-04 ENCOUNTER — Ambulatory Visit (HOSPITAL_COMMUNITY): Payer: Managed Care, Other (non HMO) | Attending: Internal Medicine

## 2016-04-04 HISTORY — PX: CARDIOVASCULAR STRESS TEST: SHX262

## 2016-04-04 LAB — MYOCARDIAL PERFUSION IMAGING
CHL CUP NUCLEAR SDS: 5
CHL CUP NUCLEAR SRS: 13
CSEPED: 5 min
CSEPEW: 6.9 METS
CSEPPHR: 153 {beats}/min
Exercise duration (sec): 51 s
LHR: 0.41
LV dias vol: 104 mL (ref 46–106)
LVSYSVOL: 46 mL
MPHR: 171 {beats}/min
Percent HR: 90 %
RPE: 17
Rest HR: 92 {beats}/min
SSS: 16
TID: 0.97

## 2016-04-04 MED ORDER — TECHNETIUM TC 99M TETROFOSMIN IV KIT
31.5000 | PACK | Freq: Once | INTRAVENOUS | Status: AC | PRN
  Administered 2016-04-04: 31.5 via INTRAVENOUS
  Filled 2016-04-04: qty 32

## 2016-04-05 ENCOUNTER — Other Ambulatory Visit: Payer: Self-pay | Admitting: Family Medicine

## 2016-04-05 ENCOUNTER — Telehealth: Payer: Self-pay | Admitting: *Deleted

## 2016-04-05 DIAGNOSIS — IMO0002 Reserved for concepts with insufficient information to code with codable children: Secondary | ICD-10-CM

## 2016-04-05 DIAGNOSIS — R0789 Other chest pain: Secondary | ICD-10-CM

## 2016-04-05 DIAGNOSIS — E118 Type 2 diabetes mellitus with unspecified complications: Principal | ICD-10-CM

## 2016-04-05 DIAGNOSIS — R9439 Abnormal result of other cardiovascular function study: Secondary | ICD-10-CM

## 2016-04-05 DIAGNOSIS — E1165 Type 2 diabetes mellitus with hyperglycemia: Secondary | ICD-10-CM

## 2016-04-05 NOTE — Telephone Encounter (Signed)
Pt LMOM on 04/05/16 at 2:37pm stating that she spoke with Dr. [name] and he stated that her stress test showed that she had a heart attack in the past. She is trying to figure out when she had a heart attack and wants to know if the EKG done at the hospital a few months back would have shown that she had a heart attack. Please advise. Thanks.

## 2016-04-05 NOTE — Telephone Encounter (Signed)
Pls notify pt that it is impossible to know when she had the heart attack. The EKG from May 2017 did not show any sign of a heart attack, but EKGs are not the best way to see damage to the heart (sometimes an EKG is normal when the heart is abnormal, AND sometimes an EKG is abnormal when the heart is normal.  Try to reassure her.-thx

## 2016-04-06 NOTE — Telephone Encounter (Signed)
Left detailed message on cell voice mail, okay per DPR.

## 2016-04-06 NOTE — Telephone Encounter (Signed)
RF request for atorvastatin LOV: 10/14/15 Next ov: 04/19/16 Last written: 10/01/15 #30 w/ 3RF

## 2016-04-11 NOTE — Progress Notes (Signed)
Cardiology Office Note    Date:  04/12/2016   ID:  Hill, Alexa 03/30/1965, MRN [id number]  PCP:  Tammi Sou, MD  Cardiologist:  Reiana Poteet Martinique, MD    History of Present Illness:  [name] is a 51 y.o. female seen at the request of Dr. [name] for evaluation of atypical chest pain and abnormal stress test. She has multiple risk factors including diabetes mellitus, HTN, HL, and obesity. She has a history of OSA. She is a smoker.   She was seen in the ED last May with chest pain. Evaluation at that time was normal. Ecg was normal. She was later referred for nuclear stress test this month that showed evidence of inferior infarct with EF 51%. She describes symptoms of intermittent chest pain, jaw pain, and left arm pain for 1-2 years. No relation to activity or stress. No clear triggers. Denies SOB, N/V, or palpitations. No diaphoresis. Cannot remember a time when her symptoms were worse than usual. Family history is unknown since she is adopted.  Past Medical History:  Diagnosis Date  . ADD (attention deficit disorder with hyperactivity)   . Allergic rhinitis   . Bowel obstruction 06-25-2010   post mastectomy  . Breast cancer ([place]) 2012   left mastectomy and chemotherapy  . CAD (coronary artery disease)    Myocardial perf imaging 04/04/16: Low risk stress nuclear study with prior inferior infarct and mild peri-infarct ischemia; EF 55 with mild hypokinesis of the inferior wall.  . Cholelithiasis 03/2016   u/s: no signs of cholecystitis  . Depression   . GERD (gastroesophageal reflux disease)   . Hyperlipidemia   . Hypertension   . Mild persistent asthma    dx'd age 4 (Dr. Gwenette Greet) hx of noncompliance with controller therapy due to cost  . Morbid obesity ([place])   . NASH (nonalcoholic steatohepatitis) 02/2010   u/s confirmed 02/2010 (hx of transaminitis).  Repeat u/s 03/2016 showed no sign of cirrhosis or liver mass, just moderate hepatic steatosis.  . Neuropathy ([place])   .  OSA (obstructive sleep apnea)    Dr Gwenette Greet  . Ovarian mass    resected 07/12/10  . RLS (restless legs syndrome)   . Type II or unspecified type diabetes mellitus without mention of complication, uncontrolled 05/15/2012    Past Surgical History:  Procedure Laterality Date  . BILATERAL SALPINGOOPHORECTOMY  07/12/2010   enlarged ovary  . CARDIOVASCULAR STRESS TEST  04/04/2016   Low risk stress nuclear study with prior inferior infarct and mild peri-infarct ischemia; EF 55% with mild hypokinesis of the inferior wall.  . G 2 P 2    . MASTECTOMY  06/21/2010   Left ; [facility]    Current Medications: Outpatient Medications Prior to Visit  Medication Sig Dispense Refill  . albuterol (PROAIR HFA) 108 (90 Base) MCG/ACT inhaler Inhale 1 puff into the lungs every 6 (six) hours as needed for wheezing or shortness of breath. 18 g 0  . atorvastatin (LIPITOR) 40 MG tablet TAKE ONE TABLET BY MOUTH DAILY 30 tablet 6  . cetirizine (ZYRTEC) 10 MG tablet Take 10 mg by mouth daily.      . citalopram (CELEXA) 40 MG tablet Take 1 tablet (40 mg total) by mouth daily. 30 tablet 6  . gabapentin (NEURONTIN) 300 MG capsule TAKE 2 CAPSULES (600 MG TOTAL) BY MOUTH 2 (TWO) TIMES DAILY. 120 capsule 6  . ibuprofen (ADVIL,MOTRIN) 200 MG tablet Take 800 mg by mouth as needed for fever or moderate  pain.     . metFORMIN (GLUCOPHAGE) 1000 MG tablet TAKE 1 TABLET (1,000 MG TOTAL) BY MOUTH 2 (TWO) TIMES DAILY WITH A MEAL. 60 tablet 0  . Multiple Vitamin (MULTIVITAMIN) tablet Take 1 tablet by mouth daily.      Marland Kitchen rOPINIRole (REQUIP) 0.5 MG tablet TAKE ONE TABLET BY MOUTH DAILY 30 tablet 6  . triamterene-hydrochlorothiazide (MAXZIDE-25) 37.5-25 MG tablet TAKE 1/2 TABLET BY MOUTH EVERY DAY 45 tablet 3  . verapamil (CALAN) 120 MG tablet TAKE 0.5 TABLETS (60 MG TOTAL) BY MOUTH 2 (TWO) TIMES DAILY. 90 tablet 6  . dextroamphetamine (DEXEDRINE SPANSULE) 10 MG 24 hr capsule 2 caps po qAM 60 capsule 0   No  facility-administered medications prior to visit.      Allergies:   Metoprolol and Sulfonamide derivatives   Social History   Social History  . Marital status: Married    Spouse name: [name]  . Number of children: 2  . Years of education: [name]   Occupational History  . 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 Smoking status: Current Every Day Smoker    Packs/day: 0.50    Years: 24.00  . Smokeless tobacco: Never Used  . Alcohol use No  . Drug use: No  . Sexual activity: Not Asked     Comment: 1/2 pack a day   Other Topics Concern  . None   Social History Narrative   She is working at [employer], previously worked as a Art therapist.   Lives with husband and two children   2-3 caffeine drinks daily            Family History:  The patient's family history includes Healthy in her daughter and son. She was adopted. she does not know her family history.  ROS:   Please see the history of present illness.    ROS All other systems reviewed and are negative.   PHYSICAL EXAM:   VS:  BP 128/90   Pulse 86   Ht 5' 4"  (1.626 m)   Wt 269 lb (122 kg)   LMP 07/14/2010   BMI 46.17 kg/m    GEN: Well nourished, obese, in no acute distress  HEENT: normal  Neck: no JVD, carotid bruits, or masses Cardiac: RRR; no murmurs, rubs, or gallops,no edema, pulses 2+ equal Respiratory:  clear to auscultation bilaterally, normal work of breathing GI: soft, nontender, nondistended, + BS MS: no deformity or atrophy  Skin: warm and dry, no rash Neuro:  Alert and Oriented x 3, Strength and sensation are intact Psych: euthymic mood, full affect  Wt Readings from Last 3 Encounters:  04/12/16 269 lb (122 kg)  03/27/16 275 lb (124.7 kg)  03/22/16 275 lb 12 oz (125.1 kg)      Studies/Labs Reviewed:   EKG:  EKG dated 07/26/15 shows NSR with rSr' consistent with RV conduction delay, otherwise normal EKG is ordered today.  The ekg ordered today demonstrates NSR with rate 87.  Normal Ecg.I have personally reviewed and interpreted this study.   Recent Labs: 07/23/2015: B Natriuretic Peptide 9.0; Hemoglobin 14.4; Platelets 293 09/23/2015: ALT 166; BUN 17; Creatinine, Ser 0.63; Potassium 4.5; Sodium 141; TSH 1.62   Lipid Panel    Component Value Date/Time   CHOL 161 09/23/2015 1340   TRIG 271.0 (H) 09/23/2015 1340   HDL 29.90 (L) 09/23/2015 1340   CHOLHDL 5 09/23/2015 1340   VLDL 54.2 (H) 09/23/2015 1340   LDLCALC 102 (H) 05/03/2010 1433  LDLDIRECT 103.0 09/23/2015 1340    Additional studies/ records that were reviewed today include:  Myoview 04/04/16: Study Highlights     Blood pressure demonstrated a hypertensive response to exercise.  Nuclear stress EF: 55%.  There was no ST segment deviation noted during stress.  Defect 1: There is a large defect of severe severity present in the basal inferior and mid inferior location.  This is a low risk study.  Findings consistent with prior myocardial infarction with peri-infarct ischemia.  The left ventricular ejection fraction is normal (55-65%).   Low risk stress nuclear study with prior inferior infarct and mild peri-infarct ischemia; EF 55 with mild hypokinesis of the inferior wall.   CT chest reviewed from 5/17 showed no significant coronary calcification.  ASSESSMENT:    1. Hyperlipidemia, mixed   2. Essential hypertension   3. Abnormal nuclear stress test   4. Diabetes mellitus without complication ([place])      PLAN:  In order of problems listed above:  1. Patient has symptoms concerning for angina. She has multiple risk factors. She has an abnormal Myoview suggesting inferior infarct. Recommend proceeding to definitive evaluation with cardiac cath. Possible PCI. The procedure and risks were reviewed including but not limited to death, myocardial infarction, stroke, arrythmias, bleeding, transfusion, emergency surgery, dye allergy, or renal dysfunction. The patient voices understanding and  is agreeable to proceed. Will update lab work and Ecg today.  2. BP currently well controlled. 3. Now on statin. Will update labs today. 4. OSA is apparently not bad enough to need CPAP. Encourage weight loss. Patient is motivated to begin an exercise program depending on results of cardiac cath.    Medication Adjustments/Labs and Tests Ordered: Current medicines are reviewed at length with the patient today.  Concerns regarding medicines are outlined above.  Medication changes, Labs and Tests ordered today are listed in the Patient Instructions below. Patient Instructions  We will schedule you for a cardiac cath procedure      Signed, Yoselin Amerman Martinique, MD  04/12/2016 2:49 PM    [facility] [address], [place], [place], [address] [phone]

## 2016-04-12 ENCOUNTER — Other Ambulatory Visit: Payer: Self-pay | Admitting: Cardiology

## 2016-04-12 ENCOUNTER — Encounter: Payer: Self-pay | Admitting: Cardiology

## 2016-04-12 ENCOUNTER — Ambulatory Visit (INDEPENDENT_AMBULATORY_CARE_PROVIDER_SITE_OTHER): Payer: Managed Care, Other (non HMO) | Admitting: Cardiology

## 2016-04-12 VITALS — BP 128/90 | HR 86 | Ht 64.0 in | Wt 269.0 lb

## 2016-04-12 DIAGNOSIS — E782 Mixed hyperlipidemia: Secondary | ICD-10-CM

## 2016-04-12 DIAGNOSIS — E119 Type 2 diabetes mellitus without complications: Secondary | ICD-10-CM

## 2016-04-12 DIAGNOSIS — R9439 Abnormal result of other cardiovascular function study: Secondary | ICD-10-CM | POA: Insufficient documentation

## 2016-04-12 DIAGNOSIS — I10 Essential (primary) hypertension: Secondary | ICD-10-CM | POA: Diagnosis not present

## 2016-04-12 MED ORDER — ASPIRIN 81 MG PO CHEW: 81.0000 mg | CHEWABLE_TABLET | Freq: Once | ORAL | Status: DC

## 2016-04-12 NOTE — Patient Instructions (Signed)
We will schedule you for a cardiac cath procedure

## 2016-04-13 ENCOUNTER — Encounter: Payer: Self-pay | Admitting: Family Medicine

## 2016-04-13 LAB — PROTIME-INR
INR: 1
Prothrombin Time: 10.7 s (ref 9.0–11.5)

## 2016-04-13 LAB — CBC WITH DIFFERENTIAL/PLATELET
Basophils Absolute: 90 cells/uL (ref 0–200)
Basophils Relative: 1 %
EOS PCT: 4 %
Eosinophils Absolute: 360 cells/uL (ref 15–500)
HEMATOCRIT: 40.6 % (ref 35.0–45.0)
Hemoglobin: 13.9 g/dL (ref 11.7–15.5)
LYMPHS PCT: 43 %
Lymphs Abs: 3870 cells/uL (ref 850–3900)
MCH: 31.4 pg (ref 27.0–33.0)
MCHC: 34.2 g/dL (ref 32.0–36.0)
MCV: 91.9 fL (ref 80.0–100.0)
MPV: 9.1 fL (ref 7.5–12.5)
Monocytes Absolute: 720 cells/uL (ref 200–950)
Monocytes Relative: 8 %
NEUTROS PCT: 44 %
Neutro Abs: 3960 cells/uL (ref 1500–7800)
Platelets: 306 10*3/uL (ref 140–400)
RBC: 4.42 MIL/uL (ref 3.80–5.10)
RDW: 13.1 % (ref 11.0–15.0)
WBC: 9 10*3/uL (ref 3.8–10.8)

## 2016-04-13 LAB — COMPREHENSIVE METABOLIC PANEL
ALT: 72 U/L — ABNORMAL HIGH (ref 6–29)
AST: 32 U/L (ref 10–35)
Albumin: 4.1 g/dL (ref 3.6–5.1)
Alkaline Phosphatase: 38 U/L (ref 33–115)
BUN: 9 mg/dL (ref 7–25)
CALCIUM: 9.7 mg/dL (ref 8.6–10.2)
CHLORIDE: 104 mmol/L (ref 98–110)
CO2: 28 mmol/L (ref 20–31)
Creat: 0.67 mg/dL (ref 0.50–1.10)
GLUCOSE: 76 mg/dL (ref 65–99)
POTASSIUM: 4.7 mmol/L (ref 3.5–5.3)
Sodium: 141 mmol/L (ref 135–146)
Total Bilirubin: 0.4 mg/dL (ref 0.2–1.2)
Total Protein: 7 g/dL (ref 6.1–8.1)

## 2016-04-13 LAB — LIPID PANEL
CHOL/HDL RATIO: 3.5 ratio (ref <5.0)
Cholesterol: 94 mg/dL (ref <200)
HDL: 27 mg/dL — AB (ref >50)
LDL CALC: 44 mg/dL (ref <100)
Triglycerides: 117 mg/dL (ref <150)
VLDL: 23 mg/dL (ref <30)

## 2016-04-13 LAB — HEMOGLOBIN A1C
Hgb A1c MFr Bld: 6.5 % — ABNORMAL HIGH (ref <5.7)
MEAN PLASMA GLUCOSE: 140 mg/dL

## 2016-04-14 ENCOUNTER — Telehealth: Payer: Self-pay | Admitting: Cardiology

## 2016-04-14 NOTE — Telephone Encounter (Signed)
new message   pt verbalized that she is returning call for rn

## 2016-04-14 NOTE — Telephone Encounter (Signed)
Results and recommendations discussed with patient, who verbalized understanding and thanks.

## 2016-04-19 ENCOUNTER — Ambulatory Visit (INDEPENDENT_AMBULATORY_CARE_PROVIDER_SITE_OTHER): Payer: Managed Care, Other (non HMO) | Admitting: Family Medicine

## 2016-04-19 ENCOUNTER — Encounter: Payer: Self-pay | Admitting: Family Medicine

## 2016-04-19 VITALS — BP 135/83 | HR 81 | Temp 98.7°F | Resp 16 | Ht 64.0 in | Wt 274.0 lb

## 2016-04-19 DIAGNOSIS — F418 Other specified anxiety disorders: Secondary | ICD-10-CM | POA: Diagnosis not present

## 2016-04-19 DIAGNOSIS — F419 Anxiety disorder, unspecified: Secondary | ICD-10-CM

## 2016-04-19 DIAGNOSIS — F329 Major depressive disorder, single episode, unspecified: Secondary | ICD-10-CM

## 2016-04-19 DIAGNOSIS — R252 Cramp and spasm: Secondary | ICD-10-CM | POA: Diagnosis not present

## 2016-04-19 DIAGNOSIS — F909 Attention-deficit hyperactivity disorder, unspecified type: Secondary | ICD-10-CM | POA: Diagnosis not present

## 2016-04-19 DIAGNOSIS — F17200 Nicotine dependence, unspecified, uncomplicated: Secondary | ICD-10-CM

## 2016-04-19 DIAGNOSIS — G4733 Obstructive sleep apnea (adult) (pediatric): Secondary | ICD-10-CM

## 2016-04-19 DIAGNOSIS — F32A Depression, unspecified: Secondary | ICD-10-CM

## 2016-04-19 DIAGNOSIS — R9439 Abnormal result of other cardiovascular function study: Secondary | ICD-10-CM

## 2016-04-19 MED ORDER — GUANFACINE HCL ER 1 MG PO TB24: 1.0000 mg | ORAL_TABLET | Freq: Every day | ORAL | 0 refills | Status: DC

## 2016-04-19 MED ORDER — GUANFACINE HCL ER 2 MG PO TB24: 2.0000 mg | ORAL_TABLET | Freq: Every day | ORAL | 0 refills | Status: DC

## 2016-04-19 NOTE — Patient Instructions (Signed)
Take one otc magnesium oxide tab (500 mg) once daily. Drink 3 oz of tonic water every morning and every evening.

## 2016-04-19 NOTE — Progress Notes (Signed)
Pre visit review using our clinic review tool, if applicable. No additional management support is needed unless otherwise documented below in the visit note. 

## 2016-04-19 NOTE — Progress Notes (Signed)
OFFICE VISIT  04/19/2016   CC:  Chief Complaint  Patient presents with  . Follow-up    Depression, ADHD, DM and HLD   HPI:    Patient is a 51 y.o. Caucasian female who presents for 1 mo f/u adult ADHD and depression. Last visit I increased her dexedrine to 68m qAM but she did not start it b/c of fear of what it may due to complicate her recent heart issues (abnormal stress test).  She is considering changing to non-stimulant ADHD med.  Strattera caused too much GI upset.  She hasn't tried intuniv.  As stated above, pt had abnormal stress test since I last saw her, is set to get a cath tomorrow. Her cardiologist did labs recently and all looked great, including HbA1c.  She has neurology appt for eval of OSA set for tomorrow but will have to reschedule b/c she is getting her cardiac cath tomorrow.    Mood/anxiety is pretty stable.  Trying to cut back more on smoking.  Says she can't handle the way chantix has to be titrated up--"too confusing/ec".  Thinking about [facility].  She complains of recurrent muscle cramps x 25+ years ago: virtually can occur anywhere on her body.  Says they are debilitating and last 5-10 min.  She is unable to get up and move around when they occur.   Past Medical History:  Diagnosis Date  . ADD (attention deficit disorder with hyperactivity)   . Allergic rhinitis   . Bowel obstruction 06-25-2010   post mastectomy  . Breast cancer ([place] 2012   left mastectomy and chemotherapy  . CAD (coronary artery disease)    Myocardial perf imaging 04/04/16: Low risk stress nuclear study with prior inferior infarct and mild peri-infarct ischemia; EF 55 with mild hypokinesis of the inferior wall.  Cardiologist plans cath as of 04/13/16.  .Marland KitchenCholelithiasis 03/2016   u/s: no signs of cholecystitis  . Depression   . GERD (gastroesophageal reflux disease)   . Hyperlipidemia   . Hypertension   . Mild persistent asthma    dx'd age 51(Dr. CGwenette Greet  hx of noncompliance with controller therapy due to cost  . Morbid obesity ([place]   . NASH (nonalcoholic steatohepatitis) 02/2010   u/s confirmed 02/2010 (hx of transaminitis).  Repeat u/s 03/2016 showed no sign of cirrhosis or liver mass, just moderate hepatic steatosis.  . [place]uropathy ([place]   . OSA (obstructive sleep apnea)    Dr CGwenette Greet . Ovarian mass    resected 07/12/10  . RLS (restless legs syndrome)   . Type II or unspecified type diabetes mellitus without mention of complication, uncontrolled 05/15/2012    Past Surgical History:  Procedure Laterality Date  . BILATERAL SALPINGOOPHORECTOMY  07/12/2010   enlarged ovary  . CARDIOVASCULAR STRESS TEST  04/04/2016   Low risk stress nuclear study with prior inferior infarct and mild peri-infarct ischemia; EF 55% with mild hypokinesis of the inferior wall.  . G 2 P 2    . [facility]   [facility]   Outpatient Medications Prior to Visit  Medication Sig Dispense Refill  . albuterol (PROAIR HFA) 108 (90 Base) MCG/ACT inhaler Inhale 1 puff into the lungs every 6 (six) hours as needed for wheezing or shortness of breath. 18 g 0  . aspirin EC 81 MG tablet Take 81 mg by mouth daily.    .Marland Kitchenatorvastatin (LIPITOR) 40 MG tablet TAKE ONE TABLET BY MOUTH DAILY  30 tablet 6  . Calcium Carbonate-Vitamin D (CALCIUM 600+D PO) Take 1 tablet by mouth daily.    . cetirizine (ZYRTEC) 10 MG tablet Take 10 mg by mouth daily.      . cholecalciferol (VITAMIN D) 1000 units tablet Take 1,000 Units by mouth daily.    . citalopram (CELEXA) 40 MG tablet Take 1 tablet (40 mg total) by mouth daily. 30 tablet 6  . gabapentin (NEURONTIN) 300 MG capsule TAKE 2 CAPSULES (600 MG TOTAL) BY MOUTH 2 (TWO) TIMES DAILY. 120 capsule 6  . ibuprofen (ADVIL,MOTRIN) 200 MG tablet Take 800 mg by mouth as needed for fever or moderate pain.     . metFORMIN (GLUCOPHAGE) 1000 MG tablet TAKE 1 TABLET (1,000 MG TOTAL) BY MOUTH 2 (TWO) TIMES DAILY WITH A MEAL. 60  tablet 0  . Multiple Vitamin (MULTIVITAMIN) tablet Take 1 tablet by mouth daily.      Marland Kitchen Propylene Glycol (SYSTANE BALANCE OP) Place 1 drop into both eyes 2 (two) times daily as needed.    Marland Kitchen rOPINIRole (REQUIP) 0.5 MG tablet TAKE ONE TABLET BY MOUTH DAILY 30 tablet 6  . triamterene-hydrochlorothiazide (MAXZIDE-25) 37.5-25 MG tablet TAKE 1/2 TABLET BY MOUTH EVERY DAY 45 tablet 3  . verapamil (CALAN) 120 MG tablet TAKE 0.5 TABLETS (60 MG TOTAL) BY MOUTH 2 (TWO) TIMES DAILY. 90 tablet 6   Facility-Administered Medications Prior to Visit  Medication Dose Route Frequency Provider Last Rate Last Dose  . aspirin chewable tablet 81 mg  81 mg Oral Once Peter M Martinique, MD        Allergies  Allergen Reactions  . Metoprolol     REACTION: " chest pain"  . Sulfonamide Derivatives     nausea    ROS As per HPI  PE: Blood pressure 135/83, pulse 81, temperature 98.7 F (37.1 C), temperature source Oral, resp. rate 16, height 5' 4"  (1.626 m), weight 274 lb (124.3 kg), last menstrual period 07/14/2010, SpO2 94 %. Gen: Alert, well appearing.  Patient is oriented to person, place, time, and situation. AFFECT: pleasant, lucid thought and speech. No further exam today.  LABS:  Lab Results  Component Value Date   TSH 1.62 09/23/2015   Lab Results  Component Value Date   WBC 9.0 04/12/2016   HGB 13.9 04/12/2016   HCT 40.6 04/12/2016   MCV 91.9 04/12/2016   PLT 306 04/12/2016   Lab Results  Component Value Date   CREATININE 0.67 04/12/2016   BUN 9 04/12/2016   NA 141 04/12/2016   K 4.7 04/12/2016   CL 104 04/12/2016   CO2 28 04/12/2016   Lab Results  Component Value Date   ALT 72 (H) 04/12/2016   AST 32 04/12/2016   ALKPHOS 38 04/12/2016   BILITOT 0.4 04/12/2016   Lab Results  Component Value Date   CHOL 94 04/12/2016   Lab Results  Component Value Date   HDL 27 (L) 04/12/2016   Lab Results  Component Value Date   LDLCALC 44 04/12/2016   Lab Results  Component Value Date    TRIG 117 04/12/2016   Lab Results  Component Value Date   CHOLHDL 3.5 04/12/2016   Lab Results  Component Value Date   HGBA1C 6.5 (H) 04/12/2016   IMPRESSION AND PLAN:  1) Adult ADHD: stay off dexedrine at this time. Start trial of intuniv 26m x 15d, then 274mx 15d--then recheck in office.  2) Anxiety and depression: stable through the rough situations lately.  Continue  citalopram 55m qd.  3) Roving, recurrent muscle cramps--chronic: recent electrolytes normal. Stressed the importance of good hydration.   Further instructions:Take one otc magnesium oxide tab (500 mg) once daily. Drink 3 oz of tonic water every morning and every evening.  4) Tob dependence: encouraged cessation.  She is considering taking a cessation class via [facility]  5) CAD: stress test recently abnormal; set for cardiac cath tomorrow. Recent lipids very good.  She is on ASA.  BP control is good.  6) OSA: she'll reschedule her neurology consult that we arranged earlier for further evaluation of this problem.  An After Visit Summary was printed and given to the patient.  Spent 30 min with pt today, with >50% of this time spent in counseling and care coordination regarding the above problems.  FOLLOW UP: Return in about 4 weeks (around 05/17/2016) for routine chronic illness f/u (30 min).  Signed:  PCrissie Sickles MD           04/19/2016

## 2016-04-20 ENCOUNTER — Institutional Professional Consult (permissible substitution): Payer: Managed Care, Other (non HMO) | Admitting: Neurology

## 2016-04-20 ENCOUNTER — Ambulatory Visit (HOSPITAL_COMMUNITY)
Admission: RE | Admit: 2016-04-20 | Discharge: 2016-04-20 | Disposition: A | Discharge status: Home / Self Care | Payer: Managed Care, Other (non HMO) | Source: Ambulatory Visit | Attending: Cardiology | Admitting: Cardiology

## 2016-04-20 ENCOUNTER — Encounter (HOSPITAL_COMMUNITY): Payer: Self-pay | Admitting: Cardiology

## 2016-04-20 ENCOUNTER — Encounter (HOSPITAL_COMMUNITY)
Admission: RE | Disposition: A | Discharge status: Home / Self Care | Payer: Self-pay | Source: Ambulatory Visit | Attending: Cardiology

## 2016-04-20 DIAGNOSIS — G4733 Obstructive sleep apnea (adult) (pediatric): Secondary | ICD-10-CM | POA: Diagnosis not present

## 2016-04-20 DIAGNOSIS — Z882 Allergy status to sulfonamides status: Secondary | ICD-10-CM | POA: Diagnosis not present

## 2016-04-20 DIAGNOSIS — Z6841 Body Mass Index (BMI) 40.0 and over, adult: Secondary | ICD-10-CM | POA: Insufficient documentation

## 2016-04-20 DIAGNOSIS — R9439 Abnormal result of other cardiovascular function study: Secondary | ICD-10-CM | POA: Insufficient documentation

## 2016-04-20 DIAGNOSIS — E1140 Type 2 diabetes mellitus with diabetic neuropathy, unspecified: Secondary | ICD-10-CM | POA: Insufficient documentation

## 2016-04-20 DIAGNOSIS — G2581 Restless legs syndrome: Secondary | ICD-10-CM | POA: Diagnosis not present

## 2016-04-20 DIAGNOSIS — J4530 Mild persistent asthma, uncomplicated: Secondary | ICD-10-CM | POA: Diagnosis not present

## 2016-04-20 DIAGNOSIS — I10 Essential (primary) hypertension: Secondary | ICD-10-CM | POA: Insufficient documentation

## 2016-04-20 DIAGNOSIS — F17200 Nicotine dependence, unspecified, uncomplicated: Secondary | ICD-10-CM | POA: Diagnosis present

## 2016-04-20 DIAGNOSIS — F909 Attention-deficit hyperactivity disorder, unspecified type: Secondary | ICD-10-CM | POA: Diagnosis not present

## 2016-04-20 DIAGNOSIS — Z9114 Patient's other noncompliance with medication regimen: Secondary | ICD-10-CM | POA: Diagnosis not present

## 2016-04-20 DIAGNOSIS — F17210 Nicotine dependence, cigarettes, uncomplicated: Secondary | ICD-10-CM | POA: Insufficient documentation

## 2016-04-20 DIAGNOSIS — K219 Gastro-esophageal reflux disease without esophagitis: Secondary | ICD-10-CM | POA: Insufficient documentation

## 2016-04-20 DIAGNOSIS — Z7984 Long term (current) use of oral hypoglycemic drugs: Secondary | ICD-10-CM | POA: Diagnosis not present

## 2016-04-20 DIAGNOSIS — E119 Type 2 diabetes mellitus without complications: Secondary | ICD-10-CM

## 2016-04-20 DIAGNOSIS — E782 Mixed hyperlipidemia: Secondary | ICD-10-CM | POA: Diagnosis present

## 2016-04-20 DIAGNOSIS — K7581 Nonalcoholic steatohepatitis (NASH): Secondary | ICD-10-CM | POA: Insufficient documentation

## 2016-04-20 DIAGNOSIS — R0789 Other chest pain: Secondary | ICD-10-CM | POA: Insufficient documentation

## 2016-04-20 DIAGNOSIS — F329 Major depressive disorder, single episode, unspecified: Secondary | ICD-10-CM | POA: Insufficient documentation

## 2016-04-20 DIAGNOSIS — Z853 Personal history of malignant neoplasm of breast: Secondary | ICD-10-CM | POA: Diagnosis not present

## 2016-04-20 HISTORY — PX: LEFT HEART CATH AND CORONARY ANGIOGRAPHY: CATH118249

## 2016-04-20 LAB — GLUCOSE, CAPILLARY: GLUCOSE-CAPILLARY: 136 mg/dL — AB (ref 65–99)

## 2016-04-20 SURGERY — LEFT HEART CATH AND CORONARY ANGIOGRAPHY
Anesthesia: LOCAL

## 2016-04-20 MED ORDER — MIDAZOLAM HCL 2 MG/2ML IJ SOLN
INTRAMUSCULAR | Status: AC
  Filled 2016-04-20: qty 2

## 2016-04-20 MED ORDER — MIDAZOLAM HCL 2 MG/2ML IJ SOLN
INTRAMUSCULAR | Status: DC | PRN
  Administered 2016-04-20: 1 mg via INTRAVENOUS

## 2016-04-20 MED ORDER — SODIUM CHLORIDE 0.9% FLUSH: 3.0000 mL | INTRAVENOUS | Status: DC | PRN

## 2016-04-20 MED ORDER — SODIUM CHLORIDE 0.9 % WEIGHT BASED INFUSION: 1.0000 mL/kg/h | INTRAVENOUS | Status: DC

## 2016-04-20 MED ORDER — SODIUM CHLORIDE 0.9% FLUSH: 3.0000 mL | Freq: Two times a day (BID) | INTRAVENOUS | Status: DC

## 2016-04-20 MED ORDER — SODIUM CHLORIDE 0.9% FLUSH
3.0000 mL | Freq: Two times a day (BID) | INTRAVENOUS | Status: DC
Start: 2016-04-20 — End: 2016-04-20

## 2016-04-20 MED ORDER — VERAPAMIL HCL 2.5 MG/ML IV SOLN
INTRAVENOUS | Status: DC | PRN
  Administered 2016-04-20: 10 mL via INTRA_ARTERIAL

## 2016-04-20 MED ORDER — ASPIRIN 81 MG PO CHEW
CHEWABLE_TABLET | ORAL | Status: AC
  Filled 2016-04-20: qty 1

## 2016-04-20 MED ORDER — SODIUM CHLORIDE 0.9 % WEIGHT BASED INFUSION: 1.0000 mL/kg/h | INTRAVENOUS | Status: AC

## 2016-04-20 MED ORDER — ASPIRIN 81 MG PO CHEW
81.0000 mg | CHEWABLE_TABLET | ORAL | Status: AC
  Administered 2016-04-20: 81 mg via ORAL

## 2016-04-20 MED ORDER — HEPARIN (PORCINE) IN NACL 2-0.9 UNIT/ML-% IJ SOLN
INTRAMUSCULAR | Status: DC | PRN
  Administered 2016-04-20: 1000 mL

## 2016-04-20 MED ORDER — SODIUM CHLORIDE 0.9 % IV SOLN: 250.0000 mL | INTRAVENOUS | Status: DC | PRN

## 2016-04-20 MED ORDER — FENTANYL CITRATE (PF) 100 MCG/2ML IJ SOLN
INTRAMUSCULAR | Status: AC
  Filled 2016-04-20: qty 2

## 2016-04-20 MED ORDER — SODIUM CHLORIDE 0.9 % WEIGHT BASED INFUSION
3.0000 mL/kg/h | INTRAVENOUS | Status: DC
  Administered 2016-04-20: 3 mL/kg/h via INTRAVENOUS

## 2016-04-20 MED ORDER — FENTANYL CITRATE (PF) 100 MCG/2ML IJ SOLN
INTRAMUSCULAR | Status: DC | PRN
  Administered 2016-04-20: 25 ug via INTRAVENOUS

## 2016-04-20 MED ORDER — IOPAMIDOL (ISOVUE-370) INJECTION 76%
INTRAVENOUS | Status: AC
  Filled 2016-04-20: qty 100

## 2016-04-20 MED ORDER — LIDOCAINE HCL (PF) 1 % IJ SOLN
INTRAMUSCULAR | Status: AC
  Filled 2016-04-20: qty 30

## 2016-04-20 MED ORDER — HEPARIN SODIUM (PORCINE) 1000 UNIT/ML IJ SOLN
INTRAMUSCULAR | Status: DC | PRN
  Administered 2016-04-20: 6000 [IU] via INTRAVENOUS

## 2016-04-20 MED ORDER — IOPAMIDOL (ISOVUE-370) INJECTION 76%
INTRAVENOUS | Status: DC | PRN
Start: 2016-04-20 — End: 2016-04-20
  Administered 2016-04-20: 80 mL via INTRA_ARTERIAL

## 2016-04-20 MED ORDER — VERAPAMIL HCL 2.5 MG/ML IV SOLN
INTRAVENOUS | Status: AC
  Filled 2016-04-20: qty 2

## 2016-04-20 MED ORDER — LIDOCAINE HCL (PF) 1 % IJ SOLN
INTRAMUSCULAR | Status: DC | PRN
Start: 2016-04-20 — End: 2016-04-20
  Administered 2016-04-20: 2 mL via INTRADERMAL

## 2016-04-20 MED ORDER — HEPARIN SODIUM (PORCINE) 1000 UNIT/ML IJ SOLN
INTRAMUSCULAR | Status: AC
  Filled 2016-04-20: qty 1

## 2016-04-20 SURGICAL SUPPLY — 12 items
CATH EXPO 5F FL3.5 (CATHETERS) ×1 IMPLANT
CATH EXPO 5FR ANG PIGTAIL 145 (CATHETERS) ×1 IMPLANT
CATH EXPO 5FR FR4 (CATHETERS) ×1 IMPLANT
DEVICE RAD COMP TR BAND LRG (VASCULAR PRODUCTS) ×1 IMPLANT
GLIDESHEATH SLEND SS 6F .021 (SHEATH) ×1 IMPLANT
GUIDEWIRE INQWIRE 1.5J.035X260 (WIRE) IMPLANT
INQWIRE 1.5J .035X260CM (WIRE) ×2
KIT HEART LEFT (KITS) ×2 IMPLANT
PACK CARDIAC CATHETERIZATION (CUSTOM PROCEDURE TRAY) ×2 IMPLANT
SYR MEDRAD MARK V 150ML (SYRINGE) ×2 IMPLANT
TRANSDUCER W/STOPCOCK (MISCELLANEOUS) ×2 IMPLANT
TUBING CIL FLEX 10 FLL-RA (TUBING) ×2 IMPLANT

## 2016-04-20 NOTE — Interval H&P Note (Signed)
History and Physical Interval Note:  04/20/2016 9:16 AM  [name]  has presented today for surgery, with the diagnosis of cp, abnormal stress test  The various methods of treatment have been discussed with the patient and family. After consideration of risks, benefits and other options for treatment, the patient has consented to  Procedure(s): Left Heart Cath and Coronary Angiography (N/A) as a surgical intervention .  The patient's history has been reviewed, patient examined, no change in status, stable for surgery.  I have reviewed the patient's chart and labs.  Questions were answered to the patient's satisfaction.   Cath Lab Visit (complete for each Cath Lab visit)  Clinical Evaluation Leading to the Procedure:   ACS: No.  Non-ACS:    Anginal Classification: CCS II  Anti-ischemic medical therapy: Minimal Therapy (1 class of medications)  Non-Invasive Test Results: Intermediate-risk stress test findings: cardiac mortality 1-3%/year  Prior CABG: No previous CABG       [name] [facility] 04/20/2016 9:16 AM

## 2016-04-20 NOTE — Discharge Instructions (Signed)
Introduction _______MOIRA [place]____________________________________________ was treated at our facility. 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  Employee may return to modified work on ______________________. Work activity restrictions Work activities that are not tolerated include: ___Bending ___Prolonged sitting _X__Lifting more than __5______ lb ___Squatting __X_ Prolonged standing ___Climbing _X__Reaching _X-__Pushing and pulling ___ Walking ___Other ______________________ These restrictions are effective until ______________________. Show this Return to Work statement to your supervisor at work as soon as possible. Your employer should be aware of your condition and can help with the necessary work activity restrictions. If you wish to return to work sooner than the date that is listed above, or if you have further problems that make it difficult for you to return at that time, please call our clinic or your health care provider. _________________________________________ Health Care Provider Name (printed) _________________________________________ Health Care Provider (signature) _________________________________________ Date This information is not intended to replace advice given to you by your health care provider. Make sure you discuss any questions you have with your health care provider. Document Released: 02/27/2005 Document Revised: 09/17/2015 Document Reviewed: 09/26/2013  2017 Elsevier Radial Site Care Introduction Refer to this sheet in the next few weeks. These instructions provide you with information about caring for yourself after your procedure. Your health care provider may also give you more specific instructions. Your treatment has been planned according to current medical practices, but problems sometimes occur. Call your  health care provider if you have any problems or questions after your procedure. What can I expect after the procedure? After your procedure, it is typical to have the following:  Bruising at the radial site that usually fades within 1-2 weeks.  Blood collecting in the tissue (hematoma) that may be painful to the touch. It should usually decrease in size and tenderness within 1-2 weeks. Follow these instructions at home:  Take medicines only as directed by your health care provider.  You may shower 24-48 hours after the procedure or as directed by your health care provider. Remove the bandage (dressing) and gently wash the site with plain soap and water. Pat the area dry with a clean towel. Do not rub the site, because this may cause bleeding.  Do not take baths, swim, or use a hot tub until your health care provider approves.  Check your insertion site every day for redness, swelling, or drainage.  Do not apply powder or lotion to the site.  Do not flex or bend the affected arm for 24 hours or as directed by your health care provider.  Do not push or pull heavy objects with the affected arm for 24 hours or as directed by your health care provider.  Do not lift over 10 lb (4.5 kg) for 5 days after your procedure or as directed by your health care provider.  Ask your health care provider when it is okay to:  Return to work or school.  Resume usual physical activities or sports.  Resume sexual activity.  Do not drive home if you are discharged the same day as the procedure. Have someone else drive you.  You may drive 24 hours after the procedure unless otherwise instructed by your health care provider.  Do not operate machinery or power tools for 24 hours after the procedure.  If your procedure was done as an outpatient procedure, which means that you went home the same day as your procedure, a responsible adult  should be with you for the first 24 hours after you arrive  home.  Keep all follow-up visits as directed by your health care provider. This is important. Contact a health care provider if:  You have a fever.  You have chills.  You have increased bleeding from the radial site. Hold pressure on the site. Get help right away if:  You have unusual pain at the radial site.  You have redness, warmth, or swelling at the radial site.  You have drainage (other than a small amount of blood on the dressing) from the radial site.  The radial site is bleeding, and the bleeding does not stop after 30 minutes of holding steady pressure on the site.  Your arm or hand becomes pale, cool, tingly, or numb. This information is not intended to replace advice given to you by your health care provider. Make sure you discuss any questions you have with your health care provider. Document Released: 04/01/2010 Document Revised: 08/05/2015 Document Reviewed: 09/15/2013  2017 Elsevier

## 2016-04-20 NOTE — H&P (View-Only) (Signed)
Cardiology Office Note    Date:  04/12/2016   ID:  Alexa, Hill November 10, 1965, MRN [id number]  PCP:  Alexa Sou, MD  Cardiologist:  Alexa Martinique, MD    History of Present Illness:  [name] is a 51 y.o. female seen at the request of Dr. Anitra Hill for evaluation of atypical chest pain and abnormal stress test. She has multiple risk factors including diabetes mellitus, HTN, HL, and obesity. She has a history of OSA. She is a smoker.   She was seen in the ED last May with chest pain. Evaluation at that time was normal. Ecg was normal. She was later referred for nuclear stress test this month that showed evidence of inferior infarct with EF 51%. She describes symptoms of intermittent chest pain, jaw pain, and left arm pain for 1-2 years. No relation to activity or stress. No clear triggers. Denies SOB, N/V, or palpitations. No diaphoresis. Cannot remember a time when her symptoms were worse than usual. Family history is unknown since she is adopted.  Past Medical History:  Diagnosis Date  . ADD (attention deficit disorder with hyperactivity)   . Allergic rhinitis   . Bowel obstruction 06-25-2010   post mastectomy  . Breast cancer ([place]) 2012   left mastectomy and chemotherapy  . CAD (coronary artery disease)    Myocardial perf imaging 04/04/16: Low risk stress nuclear study with prior inferior infarct and mild peri-infarct ischemia; EF 55 with mild hypokinesis of the inferior wall.  . Cholelithiasis 03/2016   u/s: no signs of cholecystitis  . Depression   . GERD (gastroesophageal reflux disease)   . Hyperlipidemia   . Hypertension   . Mild persistent asthma    dx'd age 25 (Dr. Gwenette Hill) hx of noncompliance with controller therapy due to cost  . Morbid obesity ([place])   . NASH (nonalcoholic steatohepatitis) 02/2010   u/s confirmed 02/2010 (hx of transaminitis).  Repeat u/s 03/2016 showed no sign of cirrhosis or liver mass, just moderate hepatic steatosis.  . Neuropathy ([place])   .  OSA (obstructive sleep apnea)    Dr [name]  . Ovarian mass    resected 07/12/10  . RLS (restless legs syndrome)   . Type II or unspecified type diabetes mellitus without mention of complication, uncontrolled 05/15/2012    Past Surgical History:  Procedure Laterality Date  . BILATERAL SALPINGOOPHORECTOMY  07/12/2010   enlarged ovary  . CARDIOVASCULAR STRESS TEST  04/04/2016   Low risk stress nuclear study with prior inferior infarct and mild peri-infarct ischemia; EF 55% with mild hypokinesis of the inferior wall.  . G 2 P 2    . MASTECTOMY  06/21/2010   Left ; Dr [facility]    Current Medications: Outpatient Medications Prior to Visit  Medication Sig Dispense Refill  . albuterol (PROAIR HFA) 108 (90 Base) MCG/ACT inhaler Inhale 1 puff into the lungs every 6 (six) hours as needed for wheezing or shortness of breath. 18 g 0  . atorvastatin (LIPITOR) 40 MG tablet TAKE ONE TABLET BY MOUTH DAILY 30 tablet 6  . cetirizine (ZYRTEC) 10 MG tablet Take 10 mg by mouth daily.      . citalopram (CELEXA) 40 MG tablet Take 1 tablet (40 mg total) by mouth daily. 30 tablet 6  . gabapentin (NEURONTIN) 300 MG capsule TAKE 2 CAPSULES (600 MG TOTAL) BY MOUTH 2 (TWO) TIMES DAILY. 120 capsule 6  . ibuprofen (ADVIL,MOTRIN) 200 MG tablet Take 800 mg by mouth as needed for fever or moderate  pain.     . metFORMIN (GLUCOPHAGE) 1000 MG tablet TAKE 1 TABLET (1,000 MG TOTAL) BY MOUTH 2 (TWO) TIMES DAILY WITH A MEAL. 60 tablet 0  . Multiple Vitamin (MULTIVITAMIN) tablet Take 1 tablet by mouth daily.      Marland Kitchen rOPINIRole (REQUIP) 0.5 MG tablet TAKE ONE TABLET BY MOUTH DAILY 30 tablet 6  . triamterene-hydrochlorothiazide (MAXZIDE-25) 37.5-25 MG tablet TAKE 1/2 TABLET BY MOUTH EVERY DAY 45 tablet 3  . verapamil (CALAN) 120 MG tablet TAKE 0.5 TABLETS (60 MG TOTAL) BY MOUTH 2 (TWO) TIMES DAILY. 90 tablet 6  . dextroamphetamine (DEXEDRINE SPANSULE) 10 MG 24 hr capsule 2 caps po qAM 60 capsule 0   No  facility-administered medications prior to visit.      Allergies:   Metoprolol and Sulfonamide derivatives   Social History   Social History  . Marital status: Married    Spouse name: N/A  . Number of children: 2  . Years of education: N/A   Occupational History  . 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 Smoking status: Current Every Day Smoker    Packs/day: 0.50    Years: 24.00  . Smokeless tobacco: Never Used  . Alcohol use No  . Drug use: No  . Sexual activity: Not Asked     Comment: 1/2 pack a day   Other Topics Concern  . None   Social History Narrative   She is working at [employer], previously worked as a Art therapist.   Lives with husband and two children   2-3 caffeine drinks daily            Family History:  The patient's family history includes Healthy in her daughter and son. She was adopted. she does not know her family history.  ROS:   Please see the history of present illness.    ROS All other systems reviewed and are negative.   PHYSICAL EXAM:   VS:  BP 128/90   Pulse 86   Ht 5' 4"  (1.626 m)   Wt 269 lb (122 kg)   LMP 07/14/2010   BMI 46.17 kg/m    GEN: Well nourished, obese, in no acute distress  HEENT: normal  Neck: no JVD, carotid bruits, or masses Cardiac: RRR; no murmurs, rubs, or gallops,no edema, pulses 2+ equal Respiratory:  clear to auscultation bilaterally, normal work of breathing GI: soft, nontender, nondistended, + BS MS: no deformity or atrophy  Skin: warm and dry, no rash Neuro:  Alert and Oriented x 3, Strength and sensation are intact Psych: euthymic mood, full affect  Wt Readings from Last 3 Encounters:  04/12/16 269 lb (122 kg)  03/27/16 275 lb (124.7 kg)  03/22/16 275 lb 12 oz (125.1 kg)      Studies/Labs Reviewed:   EKG:  EKG dated 07/26/15 shows NSR with rSr' consistent with RV conduction delay, otherwise normal EKG is ordered today.  The ekg ordered today demonstrates NSR with rate 87.  Normal Ecg.I have personally reviewed and interpreted this study.   Recent Labs: 07/23/2015: B Natriuretic Peptide 9.0; Hemoglobin 14.4; Platelets 293 09/23/2015: ALT 166; BUN 17; Creatinine, Ser 0.63; Potassium 4.5; Sodium 141; TSH 1.62   Lipid Panel    Component Value Date/Time   CHOL 161 09/23/2015 1340   TRIG 271.0 (H) 09/23/2015 1340   HDL 29.90 (L) 09/23/2015 1340   CHOLHDL 5 09/23/2015 1340   VLDL 54.2 (H) 09/23/2015 1340   LDLCALC 102 (H) 05/03/2010 1433  LDLDIRECT 103.0 09/23/2015 1340    Additional studies/ records that were reviewed today include:  Myoview 04/04/16: Study Highlights     Blood pressure demonstrated a hypertensive response to exercise.  Nuclear stress EF: 55%.  There was no ST segment deviation noted during stress.  Defect 1: There is a large defect of severe severity present in the basal inferior and mid inferior location.  This is a low risk study.  Findings consistent with prior myocardial infarction with peri-infarct ischemia.  The left ventricular ejection fraction is normal (55-65%).   Low risk stress nuclear study with prior inferior infarct and mild peri-infarct ischemia; EF 55 with mild hypokinesis of the inferior wall.   CT chest reviewed from 5/17 showed no significant coronary calcification.  ASSESSMENT:    1. Hyperlipidemia, mixed   2. Essential hypertension   3. Abnormal nuclear stress test   4. Diabetes mellitus without complication ([place])      PLAN:  In order of problems listed above:  1. Patient has symptoms concerning for angina. She has multiple risk factors. She has an abnormal Myoview suggesting inferior infarct. Recommend proceeding to definitive evaluation with cardiac cath. Possible PCI. The procedure and risks were reviewed including but not limited to death, myocardial infarction, stroke, arrythmias, bleeding, transfusion, emergency surgery, dye allergy, or renal dysfunction. The patient voices understanding and  is agreeable to proceed. Will update lab work and Ecg today.  2. BP currently well controlled. 3. Now on statin. Will update labs today. 4. OSA is apparently not bad enough to need CPAP. Encourage weight loss. Patient is motivated to begin an exercise program depending on results of cardiac cath.    Medication Adjustments/Labs and Tests Ordered: Current medicines are reviewed at length with the patient today.  Concerns regarding medicines are outlined above.  Medication changes, Labs and Tests ordered today are listed in the Patient Instructions below. Patient Instructions  We will schedule you for a cardiac cath procedure      Signed, Alexa Martinique, MD  04/12/2016 2:49 PM    [facility] [address], [place], [place], [address] [phone]

## 2016-04-21 ENCOUNTER — Encounter: Payer: Self-pay | Admitting: Family Medicine

## 2016-04-21 MED FILL — Lidocaine HCl Local Preservative Free (PF) Inj 1%: INTRAMUSCULAR | Qty: 30 | Status: AC

## 2016-05-02 ENCOUNTER — Other Ambulatory Visit: Payer: Self-pay | Admitting: Family Medicine

## 2016-05-02 NOTE — Telephone Encounter (Signed)
[name].   RF request for metformin LOV: 04/29/16 Next ov: None Last written: 03/16/16 #60 w/ 0RF

## 2016-05-04 NOTE — Progress Notes (Deleted)
Cardiology Office Note    Date:  05/04/2016   ID:  Alexa, Hill 01-06-1966, MRN [id number]  PCP:  Tammi Sou, MD  Cardiologist:  Mayra Brahm Martinique, MD    History of Present Illness:  [name] is a 51 y.o. female seen for follow up post cardiac cath. She presented with  atypical chest pain and abnormal stress test. She has multiple risk factors including diabetes mellitus, HTN, HL, and obesity. She has a history of OSA. She is a smoker.   She was seen in the ED last May with chest pain. Evaluation at that time was normal. Ecg was normal. She was later referred for nuclear stress test this month that showed evidence of inferior infarct with EF 51%. She describes symptoms of intermittent chest pain, jaw pain, and left arm pain for 1-2 years. No relation to activity or stress. No clear triggers. Denies SOB, N/V, or palpitations. No diaphoresis. Cannot remember a time when her symptoms were worse than usual. Family history is unknown since she is adopted. She underwent cardiac cath on 04/20/16 which showed normal coronary anatomy, normal LV function and normal LVEDP.   Past Medical History:  Diagnosis Date  . ADD (attention deficit disorder with hyperactivity)   . Allergic rhinitis   . Atypical chest pain    Myocardial perf imaging 04/04/16: Low risk stress nuclear study with prior inferior infarct and mild peri-infarct ischemia; EF 55 with mild hypokinesis of the inferior wall.  Cath normal, so stress test was FALSE POS.  . Bowel obstruction 06-25-2010   post mastectomy  . Breast cancer ([place]) 2012   left mastectomy and chemotherapy  . Cholelithiasis 03/2016   u/s: no signs of cholecystitis  . Depression   . GERD (gastroesophageal reflux disease)   . Hyperlipidemia   . Hypertension   . Mild persistent asthma    dx'd age 78 (Dr. Gwenette Greet) hx of noncompliance with controller therapy due to cost  . Morbid obesity ([place])   . NASH (nonalcoholic steatohepatitis) 02/2010   u/s confirmed  02/2010 (hx of transaminitis).  Repeat u/s 03/2016 showed no sign of cirrhosis or liver mass, just moderate hepatic steatosis.  . Neuropathy ([place])   . OSA (obstructive sleep apnea)    Dr Gwenette Greet  . Ovarian mass    resected 07/12/10  . RLS (restless legs syndrome)   . Type II or unspecified type diabetes mellitus without mention of complication, uncontrolled 05/15/2012    Past Surgical History:  Procedure Laterality Date  . BILATERAL SALPINGOOPHORECTOMY  07/12/2010   enlarged ovary  . CARDIOVASCULAR STRESS TEST  04/04/2016   Low risk stress nuclear study with prior inferior infarct and mild peri-infarct ischemia; EF 55% with mild hypokinesis of the inferior wall.  F/u cath was NORMAL, so this is suspected to be a false pos stress test (diaphragmatic attenuation)  . G 2 P 2    . LEFT HEART CATH AND CORONARY ANGIOGRAPHY N/A 04/20/2016   Normal coronaries.  EF 55-65%, normal LV function. Procedure: Left Heart Cath and Coronary Angiography;  Surgeon: Letoya Stallone M Martinique, MD;  Location: [facility];  Service: Cardiovascular;  Laterality: N/A;  . MASTECTOMY  06/21/2010   Left ; Dr [facility]    Current Medications: Outpatient Medications Prior to Visit  Medication Sig Dispense Refill  . albuterol (PROAIR HFA) 108 (90 Base) MCG/ACT inhaler Inhale 1 puff into the lungs every 6 (six) hours as needed for wheezing or shortness of breath. 18 g 0  .  aspirin EC 81 MG tablet Take 81 mg by mouth daily.    Marland Kitchen atorvastatin (LIPITOR) 40 MG tablet TAKE ONE TABLET BY MOUTH DAILY 30 tablet 6  . Calcium Carbonate-Vitamin D (CALCIUM 600+D PO) Take 1 tablet by mouth daily.    . cetirizine (ZYRTEC) 10 MG tablet Take 10 mg by mouth daily.      . cholecalciferol (VITAMIN D) 1000 units tablet Take 1,000 Units by mouth daily.    . citalopram (CELEXA) 40 MG tablet Take 1 tablet (40 mg total) by mouth daily. 30 tablet 6  . gabapentin (NEURONTIN) 300 MG capsule TAKE 2 CAPSULES (600 MG TOTAL) BY MOUTH 2 (TWO) TIMES  DAILY. 120 capsule 6  . guanFACINE (INTUNIV) 1 MG TB24 Take 1 tablet (1 mg total) by mouth daily. 15 tablet 0  . guanFACINE (INTUNIV) 2 MG TB24 SR tablet Take 1 tablet (2 mg total) by mouth daily. 15 tablet 0  . ibuprofen (ADVIL,MOTRIN) 200 MG tablet Take 800 mg by mouth as needed for fever or moderate pain.     . magnesium oxide (MAG-OX) 400 MG tablet Take 400 mg by mouth daily.    . metFORMIN (GLUCOPHAGE) 1000 MG tablet TAKE ONE TABLET BY MOUTH TWICE A DAY WITH AMEAL 60 tablet 3  . Multiple Vitamin (MULTIVITAMIN) tablet Take 1 tablet by mouth daily.      Marland Kitchen Propylene Glycol (SYSTANE BALANCE OP) Place 1 drop into both eyes 2 (two) times daily as needed.    Marland Kitchen rOPINIRole (REQUIP) 0.5 MG tablet TAKE ONE TABLET BY MOUTH DAILY 30 tablet 6  . triamterene-hydrochlorothiazide (MAXZIDE-25) 37.5-25 MG tablet TAKE 1/2 TABLET BY MOUTH EVERY DAY 45 tablet 3  . verapamil (CALAN) 120 MG tablet TAKE 0.5 TABLETS (60 MG TOTAL) BY MOUTH 2 (TWO) TIMES DAILY. 90 tablet 6   No facility-administered medications prior to visit.      Allergies:   Metoprolol and Sulfonamide derivatives   Social History   Social History  . Marital status: Married    Spouse name: N/A  . Number of children: 2  . Years of education: N/A   Occupational History  . 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 Smoking status: Current Every Day Smoker    Packs/day: 0.50    Years: 24.00  . Smokeless tobacco: Never Used  . Alcohol use No  . Drug use: No  . Sexual activity: Not on file     Comment: 1/2 pack a day   Other Topics Concern  . Not on file   Social History Narrative   She is working at [employer], previously worked as a Art therapist.   Lives with husband and two children   2-3 caffeine drinks daily            Family History:  The patient's family history includes Healthy in her daughter and son. She was adopted. she does not know her family history.  ROS:   Please see the history of present  illness.    ROS All other systems reviewed and are negative.   PHYSICAL EXAM:   VS:  There were no vitals taken for this visit.   GEN: Well nourished, obese, in no acute distress  HEENT: normal  Neck: no JVD, carotid bruits, or masses Cardiac: RRR; no murmurs, rubs, or gallops,no edema, pulses 2+ equal Respiratory:  clear to auscultation bilaterally, normal work of breathing GI: soft, nontender, nondistended, + BS MS: no deformity or atrophy  Skin: warm and  dry, no rash Neuro:  Alert and Oriented x 3, Strength and sensation are intact Psych: euthymic mood, full affect  Wt Readings from Last 3 Encounters:  04/20/16 274 lb (124.3 kg)  04/19/16 274 lb (124.3 kg)  04/12/16 269 lb (122 kg)      Studies/Labs Reviewed:   EKG:  EKG dated 07/26/15 shows NSR with rSr' consistent with RV conduction delay, otherwise normal EKG is ordered today.  The ekg ordered today demonstrates NSR with rate 87. Normal Ecg.I have personally reviewed and interpreted this study.   Recent Labs: 07/23/2015: B Natriuretic Peptide 9.0 09/23/2015: TSH 1.62 04/12/2016: ALT 72; BUN 9; Creat 0.67; Hemoglobin 13.9; Platelets 306; Potassium 4.7; Sodium 141   Lipid Panel    Component Value Date/Time   CHOL 94 04/12/2016 1311   TRIG 117 04/12/2016 1311   HDL 27 (L) 04/12/2016 1311   CHOLHDL 3.5 04/12/2016 1311   VLDL 23 04/12/2016 1311   LDLCALC 44 04/12/2016 1311   LDLDIRECT 103.0 09/23/2015 1340    Additional studies/ records that were reviewed today include:  Myoview 04/04/16: Study Highlights     Blood pressure demonstrated a hypertensive response to exercise.  Nuclear stress EF: 55%.  There was no ST segment deviation noted during stress.  Defect 1: There is a large defect of severe severity present in the basal inferior and mid inferior location.  This is a low risk study.  Findings consistent with prior myocardial infarction with peri-infarct ischemia.  The left ventricular ejection  fraction is normal (55-65%).   Low risk stress nuclear study with prior inferior infarct and mild peri-infarct ischemia; EF 55 with mild hypokinesis of the inferior wall.   CT chest reviewed from 5/17 showed no significant coronary calcification.  Cardiac cath 04/20/16: Conclusion     The left ventricular systolic function is normal.  LV end diastolic pressure is normal.  The left ventricular ejection fraction is 55-65% by visual estimate.   1. Normal coronary anatomy 2. Normal LV function 3. Normal LVEDP  Plan: based on these findings I think her stress test was falsely positive and findings were probably from diaphragmatic attenuation. Risk factor modification     ASSESSMENT:    No diagnosis found.   PLAN:  In order of problems listed above:  1. Cardiac cath showed normal coronary anatomy and normal LV function. Myoview study falsely abnormal probably due to diaphragmatic attenuation. 2. BP currently well controlled. 3. Now on statin.  4. OSA is apparently not bad enough to need CPAP. Encourage weight loss. Patient is motivated to begin an exercise program depending on results of cardiac cath.    Medication Adjustments/Labs and Tests Ordered: Current medicines are reviewed at length with the patient today.  Concerns regarding medicines are outlined above.  Medication changes, Labs and Tests ordered today are listed in the Patient Instructions below. There are no Patient Instructions on file for this visit.   Signed, Latina Frank Martinique, MD  05/04/2016 8:07 PM    [facility] [address], [place], [place], [address] [phone]

## 2016-05-05 ENCOUNTER — Ambulatory Visit: Payer: Managed Care, Other (non HMO) | Admitting: Cardiology

## 2016-05-05 ENCOUNTER — Encounter: Payer: Self-pay | Admitting: *Deleted

## 2016-05-10 ENCOUNTER — Institutional Professional Consult (permissible substitution): Payer: Managed Care, Other (non HMO) | Admitting: Neurology

## 2016-05-10 ENCOUNTER — Telehealth: Payer: Self-pay

## 2016-05-10 NOTE — Telephone Encounter (Signed)
Pt did not show for their appt with Dr. Athar today.  

## 2016-05-16 ENCOUNTER — Encounter: Payer: Self-pay | Admitting: Neurology

## 2016-05-20 ENCOUNTER — Other Ambulatory Visit: Payer: Self-pay | Admitting: Family Medicine

## 2016-06-05 ENCOUNTER — Other Ambulatory Visit: Payer: Self-pay | Admitting: Family Medicine

## 2016-06-16 ENCOUNTER — Ambulatory Visit: Payer: Managed Care, Other (non HMO) | Admitting: Family Medicine

## 2016-06-16 ENCOUNTER — Encounter: Payer: Self-pay | Admitting: Family Medicine

## 2016-06-26 ENCOUNTER — Encounter: Payer: Self-pay | Admitting: Gastroenterology

## 2016-07-20 ENCOUNTER — Other Ambulatory Visit: Payer: Self-pay | Admitting: Family Medicine

## 2016-07-21 NOTE — Telephone Encounter (Signed)
[place] [facility]  RF request for ropinirole LOV: 04/19/16 Next ov: None Last written: 09/27/15 #30 w/ 6RF  Please advise. Thanks.   ________________________________  RF request for citalopram Last written: 10/14/15 #30 w/ 6RF

## 2016-07-26 ENCOUNTER — Other Ambulatory Visit: Payer: Self-pay | Admitting: *Deleted

## 2016-07-26 MED ORDER — VERAPAMIL HCL 120 MG PO TABS: ORAL_TABLET | ORAL | 1 refills | Status: DC

## 2016-07-26 NOTE — Telephone Encounter (Signed)
[name] [place]  RF request for verapamil LOV: 04/19/16 Next ov: None Last written: 06/11/15 #90 w/ 6RF

## 2016-09-06 ENCOUNTER — Ambulatory Visit: Payer: Managed Care, Other (non HMO) | Admitting: Family Medicine

## 2016-09-07 ENCOUNTER — Ambulatory Visit: Payer: Managed Care, Other (non HMO) | Admitting: Family Medicine

## 2016-10-03 ENCOUNTER — Emergency Department (HOSPITAL_BASED_OUTPATIENT_CLINIC_OR_DEPARTMENT_OTHER)
Admission: EM | Admit: 2016-10-03 | Discharge: 2016-10-04 | Disposition: A | Discharge status: Home / Self Care | Payer: Managed Care, Other (non HMO) | Attending: Emergency Medicine | Admitting: Emergency Medicine

## 2016-10-03 ENCOUNTER — Encounter (HOSPITAL_BASED_OUTPATIENT_CLINIC_OR_DEPARTMENT_OTHER): Payer: Self-pay

## 2016-10-03 DIAGNOSIS — J45909 Unspecified asthma, uncomplicated: Secondary | ICD-10-CM | POA: Diagnosis not present

## 2016-10-03 DIAGNOSIS — Z7984 Long term (current) use of oral hypoglycemic drugs: Secondary | ICD-10-CM | POA: Insufficient documentation

## 2016-10-03 DIAGNOSIS — Y939 Activity, unspecified: Secondary | ICD-10-CM | POA: Insufficient documentation

## 2016-10-03 DIAGNOSIS — Z853 Personal history of malignant neoplasm of breast: Secondary | ICD-10-CM | POA: Diagnosis not present

## 2016-10-03 DIAGNOSIS — S90932A Unspecified superficial injury of left great toe, initial encounter: Secondary | ICD-10-CM | POA: Diagnosis not present

## 2016-10-03 DIAGNOSIS — X58XXXA Exposure to other specified factors, initial encounter: Secondary | ICD-10-CM | POA: Diagnosis not present

## 2016-10-03 DIAGNOSIS — S90935A Unspecified superficial injury of left lesser toe(s), initial encounter: Secondary | ICD-10-CM

## 2016-10-03 DIAGNOSIS — E1140 Type 2 diabetes mellitus with diabetic neuropathy, unspecified: Secondary | ICD-10-CM | POA: Diagnosis not present

## 2016-10-03 DIAGNOSIS — F17200 Nicotine dependence, unspecified, uncomplicated: Secondary | ICD-10-CM | POA: Diagnosis not present

## 2016-10-03 DIAGNOSIS — I10 Essential (primary) hypertension: Secondary | ICD-10-CM | POA: Insufficient documentation

## 2016-10-03 DIAGNOSIS — Y999 Unspecified external cause status: Secondary | ICD-10-CM | POA: Insufficient documentation

## 2016-10-03 DIAGNOSIS — Y929 Unspecified place or not applicable: Secondary | ICD-10-CM | POA: Diagnosis not present

## 2016-10-03 DIAGNOSIS — Z7982 Long term (current) use of aspirin: Secondary | ICD-10-CM | POA: Insufficient documentation

## 2016-10-03 DIAGNOSIS — Z79899 Other long term (current) drug therapy: Secondary | ICD-10-CM | POA: Insufficient documentation

## 2016-10-03 DIAGNOSIS — S99922A Unspecified injury of left foot, initial encounter: Secondary | ICD-10-CM | POA: Diagnosis present

## 2016-10-03 DIAGNOSIS — L089 Local infection of the skin and subcutaneous tissue, unspecified: Secondary | ICD-10-CM

## 2016-10-03 MED ORDER — DOXYCYCLINE HYCLATE 100 MG PO TABS
100.0000 mg | ORAL_TABLET | Freq: Once | ORAL | Status: AC
  Administered 2016-10-03: 100 mg via ORAL
  Filled 2016-10-03: qty 1

## 2016-10-03 MED ORDER — BACITRACIN-NEOMYCIN-POLYMYXIN 400-5-5000 EX OINT
TOPICAL_OINTMENT | Freq: Once | CUTANEOUS | Status: DC
  Filled 2016-10-03: qty 1

## 2016-10-03 MED ORDER — BACITRACIN ZINC 500 UNIT/GM EX OINT
TOPICAL_OINTMENT | CUTANEOUS | Status: AC
  Administered 2016-10-03: 1 via TOPICAL
  Filled 2016-10-03: qty 28.35

## 2016-10-03 MED ORDER — DOXYCYCLINE HYCLATE 100 MG PO CAPS: 100.0000 mg | ORAL_CAPSULE | Freq: Two times a day (BID) | ORAL | 0 refills | Status: DC

## 2016-10-03 MED ORDER — BACITRACIN ZINC 500 UNIT/GM EX OINT: 1.0000 "application " | TOPICAL_OINTMENT | Freq: Two times a day (BID) | CUTANEOUS | 0 refills | Status: DC

## 2016-10-03 MED ORDER — BACITRACIN ZINC 500 UNIT/GM EX OINT
TOPICAL_OINTMENT | Freq: Two times a day (BID) | CUTANEOUS | Status: DC
  Administered 2016-10-03: 1 via TOPICAL

## 2016-10-03 NOTE — ED Triage Notes (Signed)
C/o left great toenail injury x 6 days ago-redness noted around nailbed-pt reports is draining-NAD-steady gait

## 2016-10-03 NOTE — ED Provider Notes (Signed)
[facility] DE[facility] Provider Note   CSN: [id number] Arrival date & time: 10/03/16  2145     History   Chief Complaint Chief Complaint  Patient presents with  . Toe Injury    HPI [name] is a 51 y.o. female.  HPI  51 year old female with a history of type 2 diabetes presents with concern for a left great toe infection. About 5 days ago she had a long toenail and when she put her sock on it pulled back the toenail. It did not fall off. She then thinks it was pushed against the shoe. She has diabetic neuropathy so she has not felt much pain. She has seen mild redness since the injury. However now the redness is larger and there seems to be some drainage under her toenail. No fevers. No streaking. No pain.  Past Medical History:  Diagnosis Date  . ADD (attention deficit disorder with hyperactivity)   . Allergic rhinitis   . Atypical chest pain    Myocardial perf imaging 04/04/16: Low risk stress nuclear study with prior inferior infarct and mild peri-infarct ischemia; EF 55 with mild hypokinesis of the inferior wall.  Cath normal, so stress test was FALSE POS.  . Bowel obstruction ([place]) 06-25-2010   post mastectomy  . Breast cancer ([place]) 2012   left mastectomy and chemotherapy  . Cholelithiasis 03/2016   u/s: no signs of cholecystitis  . Depression   . GERD (gastroesophageal reflux disease)   . Hyperlipidemia   . Hypertension   . Mild persistent asthma    dx'd age 74 (Dr. Gwenette Greet) hx of noncompliance with controller therapy due to cost  . Morbid obesity ([place])   . NASH (nonalcoholic steatohepatitis) 02/2010   u/s confirmed 02/2010 (hx of transaminitis).  Repeat u/s 03/2016 showed no sign of cirrhosis or liver mass, just moderate hepatic steatosis.  . Neuropathy   . OSA (obstructive sleep apnea)    Dr Gwenette Greet  . Ovarian mass    resected 07/12/10  . RLS (restless legs syndrome)   . Type II or unspecified type diabetes mellitus without mention of complication, uncontrolled  05/15/2012    Patient Active Problem List   Diagnosis Date Noted  . Abnormal nuclear stress test 04/12/2016  . Cervical cancer screening 09/20/2015  . UTI (urinary tract infection) 08/27/2015  . Maxillary sinusitis, acute 06/22/2015  . Lymphadenitis 06/22/2015  . Infection of urinary tract 02/12/2015  . Eustachian tube dysfunction 08/31/2013  . BPPV (benign paroxysmal positional vertigo) 04/21/2013  . Adult ADHD 04/21/2013  . Cellulitis 03/07/2013  . Obesity, Class III, BMI 40-49.9 (morbid obesity) ([place]) 06/24/2012  . Diabetes mellitus without complication ([place]) 53/66/4403  . Peripheral neuropathy 08/18/2011  . Breast cancer of upper-inner quadrant of left female breast ([place]) 04/19/2011  . TRANSAMINASES, SERUM, ELEVATED 05/09/2010  . Hyperlipidemia, mixed 04/26/2010  . SMOKER 04/26/2010  . Fatty liver 02/10/2010  . Essential hypertension 04/18/2007  . ALLERGIC RHINITIS 04/18/2007  . Asthma, mild persistent 04/18/2007  . Obstructive sleep apnea 04/10/2007  . RESTLESS LEGS SYNDROME 04/10/2007    Past Surgical History:  Procedure Laterality Date  . BILATERAL SALPINGOOPHORECTOMY  07/12/2010   enlarged ovary  . CARDIOVASCULAR STRESS TEST  04/04/2016   Low risk stress nuclear study with prior inferior infarct and mild peri-infarct ischemia; EF 55% with mild hypokinesis of the inferior wall.  F/u cath was NORMAL, so this is suspected to be a false pos stress test (diaphragmatic attenuation)  . G 2 P 2    .  LEFT HEART CATH AND CORONARY ANGIOGRAPHY N/A 04/20/2016   Normal coronaries.  EF 55-65%, normal LV function. (Stress test prior to this was false pos due to diaphragmatic attenuation). Procedure: Left Heart Cath and Coronary Angiography;  Surgeon: Peter M Martinique, MD;  Location: [facility];  Service: Cardiovascular;  Laterality: N/A;  . MASTECTOMY  06/21/2010   Left ; Dr [facility]    OB History    No data available       Home Medications    Prior to Admission  medications   Medication Sig Start Date End Date Taking? Authorizing Provider  aspirin EC 81 MG tablet Take 81 mg by mouth daily.    [provider]  atorvastatin (LIPITOR) 40 MG tablet TAKE ONE TABLET BY MOUTH DAILY 04/06/16   McGowen, Adrian Blackwater, MD  bacitracin ointment Apply 1 application topically 2 (two) times daily. 10/03/16   Sherwood Gambler, MD  Calcium Carbonate-Vitamin D (CALCIUM 600+D PO) Take 1 tablet by mouth daily.    [provider]  cetirizine (ZYRTEC) 10 MG tablet Take 10 mg by mouth daily.      [provider]  cholecalciferol (VITAMIN D) 1000 units tablet Take 1,000 Units by mouth daily.    [provider]  citalopram (CELEXA) 40 MG tablet TAKE ONE TABLET BY MOUTH DAILY 07/21/16   McGowen, Adrian Blackwater, MD  doxycycline (VIBRAMYCIN) 100 MG capsule Take 1 capsule (100 mg total) by mouth 2 (two) times daily. One po bid x 7 days 10/03/16   Sherwood Gambler, MD  gabapentin (NEURONTIN) 300 MG capsule TAKE 2 CAPSULES (600 MG TOTAL) BY MOUTH 2 (TWO) TIMES DAILY. 02/17/16   McGowen, Adrian Blackwater, MD  guanFACINE (INTUNIV) 1 MG TB24 Take 1 tablet (1 mg total) by mouth daily. 04/19/16   McGowen, Adrian Blackwater, MD  guanFACINE (INTUNIV) 2 MG TB24 SR tablet Take 1 tablet (2 mg total) by mouth daily. 04/19/16   McGowen, Adrian Blackwater, MD  ibuprofen (ADVIL,MOTRIN) 200 MG tablet Take 800 mg by mouth as needed for fever or moderate pain.     [provider]  magnesium oxide (MAG-OX) 400 MG tablet Take 400 mg by mouth daily.    [provider]  metFORMIN (GLUCOPHAGE) 1000 MG tablet TAKE ONE TABLET BY MOUTH TWICE A DAY WITH AMEAL 05/02/16   McGowen, Adrian Blackwater, MD  Multiple Vitamin (MULTIVITAMIN) tablet Take 1 tablet by mouth daily.      [provider]  PROAIR HFA 108 (90 Base) MCG/ACT inhaler INHALE 1 PUFF BY MOUTH EVERY 6 HOURS AS NEEDED FOR WHEEZING/SHORTNESS OF BREATH 06/05/16   McGowen, Adrian Blackwater, MD  Propylene Glycol (SYSTANE BALANCE OP) Place 1 drop into both eyes  2 (two) times daily as needed.    [provider]  rOPINIRole (REQUIP) 0.5 MG tablet TAKE ONE TABLET BY MOUTH DAILY 07/21/16   McGowen, Adrian Blackwater, MD  triamterene-hydrochlorothiazide (MAXZIDE-25) 37.5-25 MG tablet TAKE 1/2 TABLET BY MOUTH EVERY DAY 05/22/16   McGowen, Adrian Blackwater, MD  verapamil (CALAN) 120 MG tablet TAKE 0.5 TABLETS (60 MG TOTAL) BY MOUTH 2 (TWO) TIMES DAILY. 07/26/16   McGowen, Adrian Blackwater, MD    Family History Family History  Problem Relation Age of Onset  . Adopted: Yes  . Healthy Son        65  . Healthy Daughter        60    Social History Social History  Substance Use Topics  . Smoking status: Current Every Day  Smoker    Packs/day: 0.50    Years: 24.00  . Smokeless tobacco: Never Used  . Alcohol use No     Allergies   Metoprolol and Sulfonamide derivatives   Review of Systems Review of Systems  Constitutional: Negative for fever.  Skin: Positive for color change. Negative for wound.  All other systems reviewed and are negative.    Physical Exam Updated Vital Signs BP 120/73 (BP Location: Left Arm)   Pulse (!) 105   Temp 98.7 F (37.1 C) (Oral)   Resp 18   Ht 5' 4"  (1.626 m)   Wt 115.7 kg (255 lb)   SpO2 98%   BMI 43.77 kg/m   Physical Exam  Constitutional: She is oriented to person, place, and time. She appears well-developed and well-nourished. No distress.  obese  HENT:  Head: Normocephalic and atraumatic.  Right Ear: External ear normal.  Left Ear: External ear normal.  Nose: Nose normal.  Eyes: Right eye exhibits no discharge. Left eye exhibits no discharge.  Pulmonary/Chest: Effort normal.  Musculoskeletal:       Feet:  Feet:  Left Foot:  Skin Integrity: Positive for erythema. Negative for ulcer, blister or skin breakdown.  Neurological: She is alert and oriented to person, place, and time.  Skin: Skin is warm and dry. She is not diaphoretic. There is erythema.  Nursing note and vitals reviewed.    ED Treatments /  Results  Labs (all labs ordered are listed, but only abnormal results are displayed) Labs Reviewed - No data to display  EKG  EKG Interpretation None       Radiology No results found.  Procedures Procedures (including critical care time)  Medications Ordered in ED Medications  neomycin-bacitracin-polymyxin (NEOSPORIN) ointment (not administered)  doxycycline (VIBRA-TABS) tablet 100 mg (not administered)     Initial Impression / Assessment and Plan / ED Course  I have reviewed the triage vital signs and the nursing notes.  Pertinent labs & imaging results that were available during my care of the patient were reviewed by me and considered in my medical decision making (see chart for details).     Patient appears to now have an infection of her left great toe. I don't feel an obvious drainable abscess.Given this is been occurring over the last 24 hours, thing is reasonable to try oral and topical antibiotics. However this does not improve but told her she may have to have her toenail removed. Will have her follow-up with podiatry. Discussed return precautions.  Final Clinical Impressions(s) / ED Diagnoses   Final diagnoses:  Infected superficial injury of toe of left foot, initial encounter    New Prescriptions New Prescriptions   BACITRACIN OINTMENT    Apply 1 application topically 2 (two) times daily.   DOXYCYCLINE (VIBRAMYCIN) 100 MG CAPSULE    Take 1 capsule (100 mg total) by mouth 2 (two) times daily. One po bid x 7 days     Sherwood Gambler, MD 10/03/16 2344

## 2016-10-11 ENCOUNTER — Institutional Professional Consult (permissible substitution): Payer: Managed Care, Other (non HMO) | Admitting: Neurology

## 2016-10-11 NOTE — Telephone Encounter (Signed)
Please follow dismissal protocol as per our No Show Policy.

## 2016-10-11 NOTE — Telephone Encounter (Signed)
Pt did not show for their appt with Dr. Rexene Alberts today. This is the pt's second no show for a new patient consult.

## 2016-10-12 ENCOUNTER — Institutional Professional Consult (permissible substitution): Payer: Managed Care, Other (non HMO) | Admitting: Neurology

## 2016-10-12 ENCOUNTER — Encounter: Payer: Self-pay | Admitting: Neurology

## 2016-10-14 ENCOUNTER — Other Ambulatory Visit: Payer: Self-pay | Admitting: Family Medicine

## 2016-10-16 ENCOUNTER — Telehealth: Payer: Self-pay | Admitting: Neurology

## 2016-10-16 NOTE — Telephone Encounter (Signed)
Corder.

## 2016-10-19 ENCOUNTER — Ambulatory Visit: Payer: Managed Care, Other (non HMO) | Admitting: Family Medicine

## 2016-10-23 ENCOUNTER — Ambulatory Visit: Payer: Managed Care, Other (non HMO) | Admitting: Family Medicine

## 2016-10-24 ENCOUNTER — Encounter: Payer: Self-pay | Admitting: Family Medicine

## 2016-10-24 ENCOUNTER — Ambulatory Visit (INDEPENDENT_AMBULATORY_CARE_PROVIDER_SITE_OTHER): Payer: Managed Care, Other (non HMO) | Admitting: Family Medicine

## 2016-10-24 ENCOUNTER — Encounter: Payer: Self-pay | Admitting: Gastroenterology

## 2016-10-24 VITALS — BP 114/75 | HR 81 | Temp 98.2°F | Resp 16 | Ht 64.0 in | Wt 256.5 lb

## 2016-10-24 DIAGNOSIS — E119 Type 2 diabetes mellitus without complications: Secondary | ICD-10-CM

## 2016-10-24 DIAGNOSIS — E7800 Pure hypercholesterolemia, unspecified: Secondary | ICD-10-CM | POA: Diagnosis not present

## 2016-10-24 DIAGNOSIS — Z135 Encounter for screening for eye and ear disorders: Secondary | ICD-10-CM

## 2016-10-24 DIAGNOSIS — I10 Essential (primary) hypertension: Secondary | ICD-10-CM | POA: Diagnosis not present

## 2016-10-24 DIAGNOSIS — Z1231 Encounter for screening mammogram for malignant neoplasm of breast: Secondary | ICD-10-CM | POA: Diagnosis not present

## 2016-10-24 DIAGNOSIS — Z1211 Encounter for screening for malignant neoplasm of colon: Secondary | ICD-10-CM

## 2016-10-24 DIAGNOSIS — Z1239 Encounter for other screening for malignant neoplasm of breast: Secondary | ICD-10-CM

## 2016-10-24 LAB — BASIC METABOLIC PANEL
BUN: 11 mg/dL (ref 6–23)
CHLORIDE: 100 meq/L (ref 96–112)
CO2: 32 meq/L (ref 19–32)
Calcium: 9.7 mg/dL (ref 8.4–10.5)
Creatinine, Ser: 0.59 mg/dL (ref 0.40–1.20)
GFR: 114.56 mL/min (ref >60.00)
GLUCOSE: 194 mg/dL — AB (ref 70–99)
Potassium: 3.9 mEq/L (ref 3.5–5.1)
SODIUM: 138 meq/L (ref 135–145)

## 2016-10-24 LAB — HEMOGLOBIN A1C: HEMOGLOBIN A1C: 6.2 % (ref 4.6–6.5)

## 2016-10-24 MED ORDER — DEXTROAMPHETAMINE SULFATE ER 10 MG PO CP24: ORAL_CAPSULE | ORAL | 0 refills | Status: DC

## 2016-10-24 NOTE — Progress Notes (Signed)
OFFICE VISIT  10/24/2016   CC:  Chief Complaint  Patient presents with  . Follow-up    [facility]CI, pt is not fasting.     HPI:    Patient is a 51 y.o. Caucasian female who presents for f/u DM 2, HTN, HLD.  Was working at a [employer] and lost some wt due to physical work.  DM 2: no home monitoring.  No poldipsia.   Taking metformin as rx'd.  HTN: no home bp monitoring.  Compliant with meds.  HLD: compliant with statin.  No side effects.  Also, regarding her adult ADD: she was going to stay on intuniv b/c we thought she may have heart dz, but this workup turned out normal.  She now wants to return to using dexedrine.  This was effective for her in the past.  Past Medical History:  Diagnosis Date  . ADD (attention deficit disorder with hyperactivity)   . Allergic rhinitis   . Atypical chest pain    Myocardial perf imaging 04/04/16: Low risk stress nuclear study with prior inferior infarct and mild peri-infarct ischemia; EF 55 with mild hypokinesis of the inferior wall.  Cath normal, so stress test was FALSE POS.  . Bowel obstruction ([place]) 06-25-2010   post mastectomy  . Breast cancer ([place]) 2012   left mastectomy and chemotherapy  . Cholelithiasis 03/2016   u/s: no signs of cholecystitis  . Depression   . GERD (gastroesophageal reflux disease)   . Hyperlipidemia   . Hypertension   . Mild persistent asthma    dx'd age 33 (Dr. Gwenette Greet) hx of noncompliance with controller therapy due to cost  . Morbid obesity ([place])   . NASH (nonalcoholic steatohepatitis) 02/2010   u/s confirmed 02/2010 (hx of transaminitis).  Repeat u/s 03/2016 showed no sign of cirrhosis or liver mass, just moderate hepatic steatosis.  . Neuropathy   . OSA (obstructive sleep apnea)    Dr Gwenette Greet  . Ovarian mass    resected 07/12/10  . RLS (restless legs syndrome)   . Type II or unspecified type diabetes mellitus without mention of complication, uncontrolled 05/15/2012    Past Surgical History:  Procedure  Laterality Date  . BILATERAL SALPINGOOPHORECTOMY  07/12/2010   enlarged ovary  . CARDIOVASCULAR STRESS TEST  04/04/2016   Low risk stress nuclear study with prior inferior infarct and mild peri-infarct ischemia; EF 55% with mild hypokinesis of the inferior wall.  F/u cath was NORMAL, so this is suspected to be a false pos stress test (diaphragmatic attenuation)  . G 2 P 2    . LEFT HEART CATH AND CORONARY ANGIOGRAPHY N/A 04/20/2016   Normal coronaries.  EF 55-65%, normal LV function. (Stress test prior to this was false pos due to diaphragmatic attenuation). Procedure: Left Heart Cath and Coronary Angiography;  Surgeon: Peter M Martinique, MD;  Location: [facility];  Service: Cardiovascular;  Laterality: N/A;  . MASTECTOMY  06/21/2010   Left ; Dr [place] [facility]    Outpatient Medications Prior to Visit  Medication Sig Dispense Refill  . atorvastatin (LIPITOR) 40 MG tablet TAKE ONE TABLET BY MOUTH DAILY 30 tablet 6  . bacitracin ointment Apply 1 application topically 2 (two) times daily. 120 g 0  . Calcium Carbonate-Vitamin D (CALCIUM 600+D PO) Take 1 tablet by mouth daily.    . cetirizine (ZYRTEC) 10 MG tablet Take 10 mg by mouth daily.      . cholecalciferol (VITAMIN D) 1000 units tablet Take 1,000 Units by mouth daily.    Marland Kitchen  citalopram (CELEXA) 40 MG tablet TAKE ONE TABLET BY MOUTH DAILY 30 tablet 5  . gabapentin (NEURONTIN) 300 MG capsule TAKE 2 CAPSULES (600 MG TOTAL) BY MOUTH 2 (TWO) TIMES DAILY. 120 capsule 6  . ibuprofen (ADVIL,MOTRIN) 200 MG tablet Take 800 mg by mouth as needed for fever or moderate pain.     . magnesium oxide (MAG-OX) 400 MG tablet Take 400 mg by mouth daily.    . metFORMIN (GLUCOPHAGE) 1000 MG tablet TAKE ONE TABLET BY MOUTH TWICE A DAY WITH AMEAL 60 tablet 2  . Multiple Vitamin (MULTIVITAMIN) tablet Take 1 tablet by mouth daily.      Marland Kitchen PROAIR HFA 108 (90 Base) MCG/ACT inhaler INHALE 1 PUFF BY MOUTH EVERY 6 HOURS AS NEEDED FOR WHEEZING/SHORTNESS OF BREATH 8.5  each 1  . Propylene Glycol (SYSTANE BALANCE OP) Place 1 drop into both eyes 2 (two) times daily as needed.    Marland Kitchen rOPINIRole (REQUIP) 0.5 MG tablet TAKE ONE TABLET BY MOUTH DAILY 90 tablet 3  . triamterene-hydrochlorothiazide (MAXZIDE-25) 37.5-25 MG tablet TAKE 1/2 TABLET BY MOUTH EVERY DAY 45 tablet 2  . verapamil (CALAN) 120 MG tablet TAKE 0.5 TABLETS (60 MG TOTAL) BY MOUTH 2 (TWO) TIMES DAILY. 90 tablet 1  . aspirin EC 81 MG tablet Take 81 mg by mouth daily.    Marland Kitchen doxycycline (VIBRAMYCIN) 100 MG capsule Take 1 capsule (100 mg total) by mouth 2 (two) times daily. One po bid x 7 days (Patient not taking: Reported on 10/24/2016) 14 capsule 0  . guanFACINE (INTUNIV) 1 MG TB24 Take 1 tablet (1 mg total) by mouth daily. (Patient not taking: Reported on 10/24/2016) 15 tablet 0  . guanFACINE (INTUNIV) 2 MG TB24 SR tablet Take 1 tablet (2 mg total) by mouth daily. (Patient not taking: Reported on 10/24/2016) 15 tablet 0   No facility-administered medications prior to visit.     Allergies  Allergen Reactions  . Metoprolol     REACTION: " chest pain"  . Sulfonamide Derivatives     nausea    ROS As per HPI  PE: Blood pressure 114/75, pulse 81, temperature 98.2 F (36.8 C), temperature source Oral, resp. rate 16, height 5' 4"  (1.626 m), weight 256 lb 8 oz (116.3 kg), SpO2 95 %. Gen: Alert, well appearing.  Patient is oriented to person, place, time, and situation. AFFECT: pleasant, lucid thought and speech. CV: RRR, no m/r/g.   LUNGS: CTA bilat, nonlabored resps, good aeration in all lung fields. EXT: no clubbing, cyanosis, or edema.    LABS:  Lab Results  Component Value Date   TSH 1.62 09/23/2015   Lab Results  Component Value Date   WBC 9.0 04/12/2016   HGB 13.9 04/12/2016   HCT 40.6 04/12/2016   MCV 91.9 04/12/2016   PLT 306 04/12/2016   Lab Results  Component Value Date   CREATININE 0.67 04/12/2016   BUN 9 04/12/2016   NA 141 04/12/2016   K 4.7 04/12/2016   CL 104 04/12/2016    CO2 28 04/12/2016   Lab Results  Component Value Date   ALT 72 (H) 04/12/2016   AST 32 04/12/2016   ALKPHOS 38 04/12/2016   BILITOT 0.4 04/12/2016   Lab Results  Component Value Date   CHOL 94 04/12/2016   Lab Results  Component Value Date   HDL 27 (L) 04/12/2016   Lab Results  Component Value Date   LDLCALC 44 04/12/2016   Lab Results  Component Value Date  TRIG 117 04/12/2016   Lab Results  Component Value Date   CHOLHDL 3.5 04/12/2016   Lab Results  Component Value Date   HGBA1C 6.5 (H) 04/12/2016    IMPRESSION AND PLAN:  1) DM 2. A1c today. Urine microalb/cr and feet exam next f/u visit. Referral to new ophthalmologist made today ---more convenient to her home.  2) HTN; The current medical regimen is effective;  continue present plan and medications. Lytes/cr today.  3) Hyperlipidemia: FLP 6 mo ago was really good.  Recheck FLP and hepatic panel in 66mo  4) Adult ADD: restart dexedrine 24h cap, 193m 2 qAM.  I printed rx's for this med (#60 per mo) today for this month, Sept 2018, and October 2018.  Appropriate fill on/after date was noted on each rx.  5) Preventative health care: referred pt to GI for colon ca screening today. Ordered screening mammogram today.  An After Visit Summary was printed and given to the patient.  FOLLOW UP: Return in about 3 months (around 01/24/2017) for routine chronic illness f/u.  Signed:  PhCrissie SicklesMD           10/24/2016

## 2016-10-26 ENCOUNTER — Encounter: Payer: Self-pay | Admitting: Family Medicine

## 2016-10-30 NOTE — Telephone Encounter (Signed)
na

## 2016-11-15 ENCOUNTER — Encounter: Payer: Self-pay | Admitting: Family Medicine

## 2016-11-22 ENCOUNTER — Other Ambulatory Visit: Payer: Self-pay | Admitting: Family Medicine

## 2016-11-22 NOTE — Telephone Encounter (Signed)
[name]  RF request for gabapentin LOV: 10/24/16 Next ov: None Last written: 02/17/16 #120 w/ 6RF  Please advise. Thanks.

## 2016-12-27 ENCOUNTER — Encounter: Payer: Managed Care, Other (non HMO) | Admitting: Gastroenterology

## 2017-01-11 ENCOUNTER — Ambulatory Visit (INDEPENDENT_AMBULATORY_CARE_PROVIDER_SITE_OTHER): Payer: Managed Care, Other (non HMO) | Admitting: Family Medicine

## 2017-01-11 ENCOUNTER — Encounter: Payer: Self-pay | Admitting: Family Medicine

## 2017-01-11 VITALS — BP 126/83 | HR 90 | Temp 98.2°F | Resp 16 | Ht 64.0 in | Wt 261.5 lb

## 2017-01-11 DIAGNOSIS — L309 Dermatitis, unspecified: Secondary | ICD-10-CM | POA: Diagnosis not present

## 2017-01-11 DIAGNOSIS — Z23 Encounter for immunization: Secondary | ICD-10-CM

## 2017-01-11 MED ORDER — FLUTICASONE PROPIONATE 0.05 % EX CREA: TOPICAL_CREAM | Freq: Two times a day (BID) | CUTANEOUS | 1 refills | Status: DC

## 2017-01-11 NOTE — Progress Notes (Signed)
OFFICE VISIT  01/11/2017   CC:  Chief Complaint  Patient presents with  . itching on hands  . Pain in feet    HPI:    Patient is a 51 y.o. Caucasian female who presents for itching hands. Intermittent itching on tops of hands and sometimes anterior tibial regions.  Lately worse on hands and she has scratched a lot and has some abrasions.  No excessive hand dryness on consistent basis, no excessive hand watching. No hx of rash showing up first.   Has applied lotion and neosporin since the recent hand itching and abrasions.    Past Medical History:  Diagnosis Date  . ADD (attention deficit disorder with hyperactivity)   . Allergic rhinitis   . Atypical chest pain    Myocardial perf imaging 04/04/16: Low risk stress nuclear study with prior inferior infarct and mild peri-infarct ischemia; EF 55 with mild hypokinesis of the inferior wall.  Cath normal, so stress test was FALSE POS.  . Bowel obstruction ([place]) 06-25-2010   post mastectomy  . Breast cancer ([place]) 2012   left mastectomy and chemotherapy  . Cholelithiasis 03/2016   u/s: no signs of cholecystitis  . Depression   . GERD (gastroesophageal reflux disease)   . Hyperlipidemia   . Hypertension   . Mild persistent asthma    dx'd age 26 (Dr. Gwenette Greet) hx of noncompliance with controller therapy due to cost  . Morbid obesity ([place])   . NASH (nonalcoholic steatohepatitis) 02/2010   u/s confirmed 02/2010 (hx of transaminitis).  Repeat u/s 03/2016 showed no sign of cirrhosis or liver mass, just moderate hepatic steatosis.  . Neuropathy   . OSA (obstructive sleep apnea)    Dr Gwenette Greet  . Ovarian mass    resected 07/12/10  . RLS (restless legs syndrome)   . Type II or unspecified type diabetes mellitus without mention of complication, uncontrolled 05/15/2012    Past Surgical History:  Procedure Laterality Date  . BILATERAL SALPINGOOPHORECTOMY  07/12/2010   enlarged ovary  . CARDIOVASCULAR STRESS TEST  04/04/2016   Low risk stress  nuclear study with prior inferior infarct and mild peri-infarct ischemia; EF 55% with mild hypokinesis of the inferior wall.  F/u cath was NORMAL, so this is suspected to be a false pos stress test (diaphragmatic attenuation)  . G 2 P 2    . LEFT HEART CATH AND CORONARY ANGIOGRAPHY N/A 04/20/2016   Normal coronaries.  EF 55-65%, normal LV function. (Stress test prior to this was false pos due to diaphragmatic attenuation). Procedure: Left Heart Cath and Coronary Angiography;  Surgeon: Peter M Martinique, MD;  Location: [facility];  Service: Cardiovascular;  Laterality: N/A;  . MASTECTOMY  06/21/2010   Left ; Dr [facility]    Outpatient Medications Prior to Visit  Medication Sig Dispense Refill  . atorvastatin (LIPITOR) 40 MG tablet TAKE ONE TABLET BY MOUTH DAILY 30 tablet 6  . bacitracin ointment Apply 1 application topically 2 (two) times daily. 120 g 0  . Calcium Carbonate-Vitamin D (CALCIUM 600+D PO) Take 1 tablet by mouth daily.    . cetirizine (ZYRTEC) 10 MG tablet Take 10 mg by mouth daily.      . cholecalciferol (VITAMIN D) 1000 units tablet Take 1,000 Units by mouth daily.    . citalopram (CELEXA) 40 MG tablet TAKE ONE TABLET BY MOUTH DAILY 30 tablet 5  . dextroamphetamine (DEXEDRINE SPANSULE) 10 MG 24 hr capsule 2 caps po qAM 60 capsule 0  . gabapentin (  NEURONTIN) 300 MG capsule TAKE 2 CAPSULES (600 MG TOTAL) BY MOUTH 2 (TWO) TIMES DAILY. 120 capsule 5  . ibuprofen (ADVIL,MOTRIN) 200 MG tablet Take 800 mg by mouth as needed for fever or moderate pain.     . magnesium oxide (MAG-OX) 400 MG tablet Take 400 mg by mouth daily.    . metFORMIN (GLUCOPHAGE) 1000 MG tablet TAKE ONE TABLET BY MOUTH TWICE A DAY WITH AMEAL 60 tablet 2  . Multiple Vitamin (MULTIVITAMIN) tablet Take 1 tablet by mouth daily.      Marland Kitchen PROAIR HFA 108 (90 Base) MCG/ACT inhaler INHALE 1 PUFF BY MOUTH EVERY 6 HOURS AS NEEDED FOR WHEEZING/SHORTNESS OF BREATH 8.5 each 1  . Propylene Glycol (SYSTANE BALANCE OP)  Place 1 drop into both eyes 2 (two) times daily as needed.    Marland Kitchen rOPINIRole (REQUIP) 0.5 MG tablet TAKE ONE TABLET BY MOUTH DAILY 90 tablet 3  . triamterene-hydrochlorothiazide (MAXZIDE-25) 37.5-25 MG tablet TAKE 1/2 TABLET BY MOUTH EVERY DAY 45 tablet 2  . verapamil (CALAN) 120 MG tablet TAKE 0.5 TABLETS (60 MG TOTAL) BY MOUTH 2 (TWO) TIMES DAILY. 90 tablet 1   No facility-administered medications prior to visit.     Allergies  Allergen Reactions  . Metoprolol     REACTION: " chest pain"  . Sulfonamide Derivatives     nausea    ROS As per HPI  PE: Blood pressure 126/83, pulse 90, temperature 98.2 F (36.8 C), temperature source Oral, resp. rate 16, height 5' 4"  (1.626 m), weight 261 lb 8 oz (118.6 kg), SpO2 95 %. Gen: Alert, well appearing.  Patient is oriented to person, place, time, and situation. AFFECT: pleasant, lucid thought and speech. SKIN: tops of both hands with patchy excoriated/scabbed skin, R>L.  No vesicles, no pustules, no streaking or hand swelling.  LABS:    Chemistry      Component Value Date/Time   NA 138 10/24/2016 1014   NA 141 01/15/2015 0911   K 3.9 10/24/2016 1014   K 3.6 01/15/2015 0911   CL 100 10/24/2016 1014   CO2 32 10/24/2016 1014   CO2 27 01/15/2015 0911   BUN 11 10/24/2016 1014   BUN 17.2 01/15/2015 0911   CREATININE 0.59 10/24/2016 1014   CREATININE 0.67 04/12/2016 1311   CREATININE 0.8 01/15/2015 0911      Component Value Date/Time   CALCIUM 9.7 10/24/2016 1014   CALCIUM 10.7 (H) 01/15/2015 0911   ALKPHOS 38 04/12/2016 1311   ALKPHOS 42 01/15/2015 0911   AST 32 04/12/2016 1311   AST 64 (H) 01/15/2015 0911   ALT 72 (H) 04/12/2016 1311   ALT 153 (H) 01/15/2015 0911   BILITOT 0.4 04/12/2016 1311   BILITOT 0.57 01/15/2015 0911     Lab Results  Component Value Date   HGBA1C 6.2 10/24/2016   IMPRESSION AND PLAN:  Pruritis: ? Xerosis, +/- eczema. Rx'd cutivate 0.05% cream bid prn. Try to occlude the itchy areas so she cannot  scratch them. When this occurs she can increase her zyrtec to 57m bid.  An After Visit Summary was printed and given to the patient.  FOLLOW UP: Return if symptoms worsen or fail to improve.  Signed:  PCrissie Sickles MD           01/11/2017

## 2017-02-21 ENCOUNTER — Other Ambulatory Visit: Payer: Self-pay | Admitting: Family Medicine

## 2017-02-23 ENCOUNTER — Other Ambulatory Visit: Payer: Self-pay | Admitting: Family Medicine

## 2017-02-24 ENCOUNTER — Other Ambulatory Visit: Payer: Self-pay | Admitting: Family Medicine

## 2017-05-01 ENCOUNTER — Other Ambulatory Visit: Payer: Self-pay | Admitting: Family Medicine

## 2017-05-01 NOTE — Telephone Encounter (Signed)
Pt advised that she is over due for f/u RCI with Dr. [name]. Apt made for 05/07/17 at 3:45pm.

## 2017-05-07 ENCOUNTER — Ambulatory Visit: Payer: Managed Care, Other (non HMO) | Admitting: Family Medicine

## 2017-06-04 ENCOUNTER — Ambulatory Visit: Payer: Managed Care, Other (non HMO) | Admitting: Family Medicine

## 2017-06-04 NOTE — Progress Notes (Deleted)
OFFICE VISIT  06/04/2017   CC: No chief complaint on file.    HPI:    Patient is a 52 y.o. Caucasian female who presents for 7 month f/u DM 2, HTN, HLD, adult ADD  Past Medical History:  Diagnosis Date  . ADD (attention deficit disorder with hyperactivity)   . Allergic rhinitis   . Atypical chest pain    Myocardial perf imaging 04/04/16: Low risk stress nuclear study with prior inferior infarct and mild peri-infarct ischemia; EF 55 with mild hypokinesis of the inferior wall.  Cath normal, so stress test was FALSE POS.  . Bowel obstruction ([place]) 06-25-2010   post mastectomy  . Breast cancer ([place]) 2012   left mastectomy and chemotherapy  . Cholelithiasis 03/2016   u/s: no signs of cholecystitis  . Depression   . GERD (gastroesophageal reflux disease)   . Hyperlipidemia   . Hypertension   . Mild persistent asthma    dx'd age 63 (Dr. Gwenette Greet) hx of noncompliance with controller therapy due to cost  . Morbid obesity ([place])   . NASH (nonalcoholic steatohepatitis) 02/2010   u/s confirmed 02/2010 (hx of transaminitis).  Repeat u/s 03/2016 showed no sign of cirrhosis or liver mass, just moderate hepatic steatosis.  . Neuropathy   . OSA (obstructive sleep apnea)    Dr Gwenette Greet  . Ovarian mass    resected 07/12/10  . RLS (restless legs syndrome)   . Type II or unspecified type diabetes mellitus without mention of complication, uncontrolled 05/15/2012    Past Surgical History:  Procedure Laterality Date  . BILATERAL SALPINGOOPHORECTOMY  07/12/2010   enlarged ovary  . CARDIOVASCULAR STRESS TEST  04/04/2016   Low risk stress nuclear study with prior inferior infarct and mild peri-infarct ischemia; EF 55% with mild hypokinesis of the inferior wall.  F/u cath was NORMAL, so this is suspected to be a false pos stress test (diaphragmatic attenuation)  . G 2 P 2    . LEFT HEART CATH AND CORONARY ANGIOGRAPHY N/A 04/20/2016   Normal coronaries.  EF 55-65%, normal LV function. (Stress test prior to this  was false pos due to diaphragmatic attenuation). Procedure: Left Heart Cath and Coronary Angiography;  Surgeon: Peter M Martinique, MD;  Location: [facility];  Service: Cardiovascular;  Laterality: N/A;  . MASTECTOMY  06/21/2010   Left ; Dr [facility]    Outpatient Medications Prior to Visit  Medication Sig Dispense Refill  . atorvastatin (LIPITOR) 40 MG tablet TAKE ONE TABLET BY MOUTH DAILY 30 tablet 5  . bacitracin ointment Apply 1 application topically 2 (two) times daily. 120 g 0  . Calcium Carbonate-Vitamin D (CALCIUM 600+D PO) Take 1 tablet by mouth daily.    . cetirizine (ZYRTEC) 10 MG tablet Take 10 mg by mouth daily.      . cholecalciferol (VITAMIN D) 1000 units tablet Take 1,000 Units by mouth daily.    . citalopram (CELEXA) 40 MG tablet TAKE ONE TABLET BY MOUTH DAILY 30 tablet 0  . dextroamphetamine (DEXEDRINE SPANSULE) 10 MG 24 hr capsule 2 caps po qAM 60 capsule 0  . fluticasone (CUTIVATE) 0.05 % cream Apply topically 2 (two) times daily. 30 g 1  . gabapentin (NEURONTIN) 300 MG capsule TAKE 2 CAPSULES (600 MG TOTAL) BY MOUTH 2 (TWO) TIMES DAILY. 120 capsule 5  . ibuprofen (ADVIL,MOTRIN) 200 MG tablet Take 800 mg by mouth as needed for fever or moderate pain.     . magnesium oxide (MAG-OX) 400 MG tablet Take  400 mg by mouth daily.    . metFORMIN (GLUCOPHAGE) 1000 MG tablet TAKE ONE TABLET BY MOUTH TWICE A DAY WITH AMEAL 60 tablet 0  . Multiple Vitamin (MULTIVITAMIN) tablet Take 1 tablet by mouth daily.      Marland Kitchen PROAIR HFA 108 (90 Base) MCG/ACT inhaler INHALE 1 PUFF BY MOUTH EVERY 6 HOURS AS NEEDED FOR WHEEZING/SHORTNESS OF BREATH 8.5 each 1  . Propylene Glycol (SYSTANE BALANCE OP) Place 1 drop into both eyes 2 (two) times daily as needed.    Marland Kitchen rOPINIRole (REQUIP) 0.5 MG tablet TAKE ONE TABLET BY MOUTH DAILY 90 tablet 3  . triamterene-hydrochlorothiazide (MAXZIDE-25) 37.5-25 MG tablet TAKE 1/2 TABLET BY MOUTH EVERY DAY 45 tablet 2  . verapamil (CALAN) 120 MG tablet TAKE  0.5 TABLETS (60 MG TOTAL) BY MOUTH 2 (TWO) TIMES DAILY. 90 tablet 1   No facility-administered medications prior to visit.     Allergies  Allergen Reactions  . Metoprolol     REACTION: " chest pain"  . Sulfonamide Derivatives     nausea    ROS As per HPI  PE: There were no vitals taken for this visit. ***  LABS:  Lab Results  Component Value Date   TSH 1.62 09/23/2015   Lab Results  Component Value Date   WBC 9.0 04/12/2016   HGB 13.9 04/12/2016   HCT 40.6 04/12/2016   MCV 91.9 04/12/2016   PLT 306 04/12/2016   Lab Results  Component Value Date   CREATININE 0.59 10/24/2016   BUN 11 10/24/2016   NA 138 10/24/2016   K 3.9 10/24/2016   CL 100 10/24/2016   CO2 32 10/24/2016   Lab Results  Component Value Date   ALT 72 (H) 04/12/2016   AST 32 04/12/2016   ALKPHOS 38 04/12/2016   BILITOT 0.4 04/12/2016   Lab Results  Component Value Date   CHOL 94 04/12/2016   Lab Results  Component Value Date   HDL 27 (L) 04/12/2016   Lab Results  Component Value Date   LDLCALC 44 04/12/2016   Lab Results  Component Value Date   TRIG 117 04/12/2016   Lab Results  Component Value Date   CHOLHDL 3.5 04/12/2016   Lab Results  Component Value Date   HGBA1C 6.2 10/24/2016    IMPRESSION AND PLAN:  No problem-specific Assessment & Plan notes found for this encounter.   FOLLOW UP: No follow-ups on file.

## 2017-06-05 ENCOUNTER — Other Ambulatory Visit: Payer: Self-pay

## 2017-06-05 ENCOUNTER — Emergency Department (HOSPITAL_BASED_OUTPATIENT_CLINIC_OR_DEPARTMENT_OTHER)
Admission: EM | Admit: 2017-06-05 | Discharge: 2017-06-05 | Disposition: A | Discharge status: Home / Self Care | Payer: Managed Care, Other (non HMO) | Attending: Emergency Medicine | Admitting: Emergency Medicine

## 2017-06-05 ENCOUNTER — Encounter (HOSPITAL_BASED_OUTPATIENT_CLINIC_OR_DEPARTMENT_OTHER): Payer: Self-pay | Admitting: *Deleted

## 2017-06-05 DIAGNOSIS — L02211 Cutaneous abscess of abdominal wall: Secondary | ICD-10-CM | POA: Insufficient documentation

## 2017-06-05 DIAGNOSIS — F17210 Nicotine dependence, cigarettes, uncomplicated: Secondary | ICD-10-CM | POA: Diagnosis not present

## 2017-06-05 DIAGNOSIS — Z7984 Long term (current) use of oral hypoglycemic drugs: Secondary | ICD-10-CM | POA: Diagnosis not present

## 2017-06-05 DIAGNOSIS — I10 Essential (primary) hypertension: Secondary | ICD-10-CM | POA: Insufficient documentation

## 2017-06-05 DIAGNOSIS — E1140 Type 2 diabetes mellitus with diabetic neuropathy, unspecified: Secondary | ICD-10-CM | POA: Insufficient documentation

## 2017-06-05 DIAGNOSIS — J45909 Unspecified asthma, uncomplicated: Secondary | ICD-10-CM | POA: Diagnosis not present

## 2017-06-05 DIAGNOSIS — Z79899 Other long term (current) drug therapy: Secondary | ICD-10-CM | POA: Insufficient documentation

## 2017-06-05 DIAGNOSIS — L989 Disorder of the skin and subcutaneous tissue, unspecified: Secondary | ICD-10-CM | POA: Diagnosis present

## 2017-06-05 DIAGNOSIS — L0291 Cutaneous abscess, unspecified: Secondary | ICD-10-CM

## 2017-06-05 MED ORDER — DOXYCYCLINE HYCLATE 100 MG PO TABS
100.0000 mg | ORAL_TABLET | Freq: Once | ORAL | Status: AC
  Administered 2017-06-05: 100 mg via ORAL
  Filled 2017-06-05: qty 1

## 2017-06-05 MED ORDER — DOXYCYCLINE HYCLATE 100 MG PO CAPS: 100.0000 mg | ORAL_CAPSULE | Freq: Two times a day (BID) | ORAL | 0 refills | Status: DC

## 2017-06-05 NOTE — ED Provider Notes (Signed)
[facility] EMERGENCY DEPARTMENT Provider Note   CSN: [id number] Arrival date & time: 06/05/17  1735     History   Chief Complaint Chief Complaint  Patient presents with  . Abscess    HPI [name] is a 52 y.o. female.  Chief complaint is "boil".  HPI 52 year old female.  History of non-insulin-dependent diabetes.  Noticed some discomfort in her left lower abdominal wall in a skin fold.  Feels like it might be an infection.  No drainage.  Presents here.  Past Medical History:  Diagnosis Date  . ADD (attention deficit disorder with hyperactivity)   . Allergic rhinitis   . Atypical chest pain    Myocardial perf imaging 04/04/16: Low risk stress nuclear study with prior inferior infarct and mild peri-infarct ischemia; EF 55 with mild hypokinesis of the inferior wall.  Cath normal, so stress test was FALSE POS.  . Bowel obstruction ([place]) 06-25-2010   post mastectomy  . Breast cancer ([place]) 2012   left mastectomy and chemotherapy  . Cholelithiasis 03/2016   u/s: no signs of cholecystitis  . Depression   . GERD (gastroesophageal reflux disease)   . Hyperlipidemia   . Hypertension   . Mild persistent asthma    dx'd age 73 (Dr. Gwenette Greet) hx of noncompliance with controller therapy due to cost  . Morbid obesity ([place])   . NASH (nonalcoholic steatohepatitis) 02/2010   u/s confirmed 02/2010 (hx of transaminitis).  Repeat u/s 03/2016 showed no sign of cirrhosis or liver mass, just moderate hepatic steatosis.  . Neuropathy   . OSA (obstructive sleep apnea)    Dr Gwenette Greet  . Ovarian mass    resected 07/12/10  . RLS (restless legs syndrome)   . Type II or unspecified type diabetes mellitus without mention of complication, uncontrolled 05/15/2012    Patient Active Problem List   Diagnosis Date Noted  . Abnormal nuclear stress test 04/12/2016  . Cervical cancer screening 09/20/2015  . UTI (urinary tract infection) 08/27/2015  . Maxillary sinusitis, acute 06/22/2015  .  Lymphadenitis 06/22/2015  . Infection of urinary tract 02/12/2015  . Eustachian tube dysfunction 08/31/2013  . BPPV (benign paroxysmal positional vertigo) 04/21/2013  . Adult ADHD 04/21/2013  . Cellulitis 03/07/2013  . Obesity, Class III, BMI 40-49.9 (morbid obesity) ([place] 06/24/2012  . Diabetes mellitus without complication ([place]) 88/91/6945  . Peripheral neuropathy 08/18/2011  . Breast cancer of upper-inner quadrant of left female breast ([place]) 04/19/2011  . TRANSAMINASES, SERUM, ELEVATED 05/09/2010  . Hyperlipidemia, mixed 04/26/2010  . SMOKER 04/26/2010  . Fatty liver 02/10/2010  . Essential hypertension 04/18/2007  . ALLERGIC RHINITIS 04/18/2007  . Asthma, mild persistent 04/18/2007  . Obstructive sleep apnea 04/10/2007  . RESTLESS LEGS SYNDROME 04/10/2007    Past Surgical History:  Procedure Laterality Date  . BILATERAL SALPINGOOPHORECTOMY  07/12/2010   enlarged ovary  . CARDIOVASCULAR STRESS TEST  04/04/2016   Low risk stress nuclear study with prior inferior infarct and mild peri-infarct ischemia; EF 55% with mild hypokinesis of the inferior wall.  F/u cath was NORMAL, so this is suspected to be a false pos stress test (diaphragmatic attenuation)  . G 2 P 2    . LEFT HEART CATH AND CORONARY ANGIOGRAPHY N/A 04/20/2016   Normal coronaries.  EF 55-65%, normal LV function. (Stress test prior to this was false pos due to diaphragmatic attenuation). Procedure: Left Heart Cath and Coronary Angiography;  Surgeon: Peter M Martinique, MD;  Location: [facility];  Service: Cardiovascular;  Laterality:  N/A;  . MASTECTOMY  06/21/2010   Left ; Dr [facility]     OB History   None      Home Medications    Prior to Admission medications   Medication Sig Start Date End Date Taking? Authorizing Provider  atorvastatin (LIPITOR) 40 MG tablet TAKE ONE TABLET BY MOUTH DAILY 02/21/17   McGowen, Adrian Blackwater, MD  bacitracin ointment Apply 1 application topically 2 (two) times daily.  10/03/16   Sherwood Gambler, MD  Calcium Carbonate-Vitamin D (CALCIUM 600+D PO) Take 1 tablet by mouth daily.    [provider]  cetirizine (ZYRTEC) 10 MG tablet Take 10 mg by mouth daily.      [provider]  cholecalciferol (VITAMIN D) 1000 units tablet Take 1,000 Units by mouth daily.    [provider]  citalopram (CELEXA) 40 MG tablet TAKE ONE TABLET BY MOUTH DAILY 05/01/17   McGowen, Adrian Blackwater, MD  dextroamphetamine (DEXEDRINE SPANSULE) 10 MG 24 hr capsule 2 caps po qAM 10/24/16   McGowen, Adrian Blackwater, MD  doxycycline (VIBRAMYCIN) 100 MG capsule Take 1 capsule (100 mg total) by mouth 2 (two) times daily. 06/05/17   Tanna Furry, MD  fluticasone (CUTIVATE) 0.05 % cream Apply topically 2 (two) times daily. 01/11/17   McGowen, Adrian Blackwater, MD  gabapentin (NEURONTIN) 300 MG capsule TAKE 2 CAPSULES (600 MG TOTAL) BY MOUTH 2 (TWO) TIMES DAILY. 11/22/16   McGowen, Adrian Blackwater, MD  ibuprofen (ADVIL,MOTRIN) 200 MG tablet Take 800 mg by mouth as needed for fever or moderate pain.     [provider]  magnesium oxide (MAG-OX) 400 MG tablet Take 400 mg by mouth daily.    [provider]  metFORMIN (GLUCOPHAGE) 1000 MG tablet TAKE ONE TABLET BY MOUTH TWICE A DAY WITH AMEAL 05/01/17   McGowen, Adrian Blackwater, MD  Multiple Vitamin (MULTIVITAMIN) tablet Take 1 tablet by mouth daily.      [provider]  PROAIR HFA 108 (90 Base) MCG/ACT inhaler INHALE 1 PUFF BY MOUTH EVERY 6 HOURS AS NEEDED FOR WHEEZING/SHORTNESS OF BREATH 02/23/17   McGowen, Adrian Blackwater, MD  Propylene Glycol (SYSTANE BALANCE OP) Place 1 drop into both eyes 2 (two) times daily as needed.    [provider]  rOPINIRole (REQUIP) 0.5 MG tablet TAKE ONE TABLET BY MOUTH DAILY 07/21/16   McGowen, Adrian Blackwater, MD  triamterene-hydrochlorothiazide (MAXZIDE-25) 37.5-25 MG tablet TAKE 1/2 TABLET BY MOUTH EVERY DAY 05/22/16   McGowen, Adrian Blackwater, MD  verapamil (CALAN) 120 MG tablet TAKE 0.5 TABLETS (60 MG TOTAL) BY MOUTH 2  (TWO) TIMES DAILY. 07/26/16   McGowen, Adrian Blackwater, MD    Family History Family History  Adopted: Yes  Problem Relation Age of Onset  . Healthy Son        15  . Healthy Daughter        56    Social History Social History   Tobacco Use  . Smoking status: Current Every Day Smoker    Packs/day: 0.50    Years: 24.00    Pack years: 12.00  . Smokeless tobacco: Never Used  Substance Use Topics  . Alcohol use: No  . Drug use: No     Allergies   Metoprolol and Sulfonamide derivatives   Review of Systems Review of Systems  Constitutional: Negative for appetite change, chills, diaphoresis, fatigue and fever.  HENT: Negative for mouth sores, sore throat and trouble swallowing.   Eyes: Negative for visual disturbance.  Respiratory: Negative for  cough, chest tightness, shortness of breath and wheezing.   Cardiovascular: Negative for chest pain.  Gastrointestinal: Negative for abdominal distention, abdominal pain, diarrhea, nausea and vomiting.  Endocrine: Negative for polydipsia, polyphagia and polyuria.  Genitourinary: Negative for dysuria, frequency and hematuria.  Musculoskeletal: Negative for gait problem.  Skin: Negative for color change, pallor and rash.       Redness pain and swelling left lower abdominal wall  Neurological: Negative for dizziness, syncope, light-headedness and headaches.  Hematological: Does not bruise/bleed easily.  Psychiatric/Behavioral: Negative for behavioral problems and confusion.     Physical Exam Updated Vital Signs BP (!) 165/93   Pulse 99   Temp 98.4 F (36.9 C) (Oral)   Resp 20   Ht 5' 3"  (1.6 m)   Wt 127 kg (280 lb)   LMP 07/14/2010 Comment: ovaries removed 2012  SpO2 97%   BMI 49.60 kg/m   Physical Exam  Constitutional: She is oriented to person, place, and time. She appears well-developed and well-nourished. No distress.  HENT:  Head: Normocephalic.  Eyes: Pupils are equal, round, and reactive to light. Conjunctivae are  normal. No scleral icterus.  Neck: Normal range of motion. Neck supple. No thyromegaly present.  Cardiovascular: Normal rate and regular rhythm. Exam reveals no gallop and no friction rub.  No murmur heard. Pulmonary/Chest: Effort normal and breath sounds normal. No respiratory distress. She has no wheezes. She has no rales.  Abdominal: Soft. Bowel sounds are normal. She exhibits no distension. There is no tenderness. There is no rebound.  3 x 2 cm area of fluctuance left lower abdominal wall under skin fold consistent with subcutaneous abscess/carbuncle  Musculoskeletal: Normal range of motion.  Neurological: She is alert and oriented to person, place, and time.  Skin: Skin is warm and dry. No rash noted.  Psychiatric: She has a normal mood and affect. Her behavior is normal.     ED Treatments / Results  Labs (all labs ordered are listed, but only abnormal results are displayed) Labs Reviewed - No data to display  EKG None  Radiology No results found.  Procedures Procedures (including critical care time)  Medications Ordered in ED Medications  doxycycline (VIBRA-TABS) tablet 100 mg (has no administration in time range)     Initial Impression / Assessment and Plan / ED Course  I have reviewed the triage vital signs and the nursing notes.  Pertinent labs & imaging results that were available during my care of the patient were reviewed by me and considered in my medical decision making (see chart for details).    INCISION AND DRAINAGE Performed by: Lolita Patella Consent: Verbal consent obtained. Risks and benefits: risks, benefits and alternatives were discussed Type: abscess  Body area: Left lower abdominal wall  Anesthesia: local infiltration  Incision was made with a scalpel.  Local anesthetic: lidocaine 1% c epinephrine  Anesthetic total: 2 ml  Complexity: complex Blunt dissection to break up loculations  Drainage: purulent  Drainage amount: 2-3  ml  Packing material: 1/4 in iodoform gauze  Patient tolerance: Patient tolerated the procedure well with no immediate complications.  Instructed in aftercare of her abscess.  Will remove one half of gauze tomorrow.  The remainder the following day.  Begin soaks and rinses at that time.  Doxycycline prescription.   Final Clinical Impressions(s) / ED Diagnoses   Final diagnoses:  Abscess    ED Discharge Orders        Ordered    doxycycline (VIBRAMYCIN) 100 MG capsule  2 times daily     06/05/17 1830       Tanna Furry, MD 06/05/17 [phone]

## 2017-06-05 NOTE — Discharge Instructions (Signed)
Remove 1 inch of gauze tomorrow.  Remove remainder of gauze on Thursday. After gauze is out.  Begin showers or soaks twice per day.   Doxycycline prescription as directed.  Doxycycline can cause sensitivity to sunlight.  Take with food to avoid stomach upset

## 2017-06-05 NOTE — ED Triage Notes (Signed)
Abscess to her pubic area since yesterday. Red, painful. Hx of diabetes.

## 2017-06-06 ENCOUNTER — Ambulatory Visit: Payer: Managed Care, Other (non HMO) | Admitting: Family Medicine

## 2017-06-06 ENCOUNTER — Other Ambulatory Visit: Payer: Self-pay | Admitting: Family Medicine

## 2017-06-14 ENCOUNTER — Ambulatory Visit: Payer: Managed Care, Other (non HMO) | Admitting: Family Medicine

## 2017-06-14 ENCOUNTER — Encounter: Payer: Self-pay | Admitting: Family Medicine

## 2017-06-14 VITALS — BP 135/86 | HR 84 | Temp 99.1°F | Resp 16 | Ht 64.0 in | Wt 273.0 lb

## 2017-06-14 DIAGNOSIS — F5101 Primary insomnia: Secondary | ICD-10-CM | POA: Diagnosis not present

## 2017-06-14 DIAGNOSIS — E7800 Pure hypercholesterolemia, unspecified: Secondary | ICD-10-CM | POA: Diagnosis not present

## 2017-06-14 DIAGNOSIS — E118 Type 2 diabetes mellitus with unspecified complications: Secondary | ICD-10-CM

## 2017-06-14 DIAGNOSIS — Z79899 Other long term (current) drug therapy: Secondary | ICD-10-CM | POA: Diagnosis not present

## 2017-06-14 DIAGNOSIS — L02214 Cutaneous abscess of groin: Secondary | ICD-10-CM

## 2017-06-14 DIAGNOSIS — E1165 Type 2 diabetes mellitus with hyperglycemia: Secondary | ICD-10-CM | POA: Diagnosis not present

## 2017-06-14 DIAGNOSIS — E119 Type 2 diabetes mellitus without complications: Secondary | ICD-10-CM

## 2017-06-14 DIAGNOSIS — F988 Other specified behavioral and emotional disorders with onset usually occurring in childhood and adolescence: Secondary | ICD-10-CM

## 2017-06-14 DIAGNOSIS — I10 Essential (primary) hypertension: Secondary | ICD-10-CM | POA: Diagnosis not present

## 2017-06-14 DIAGNOSIS — IMO0002 Reserved for concepts with insufficient information to code with codable children: Secondary | ICD-10-CM

## 2017-06-14 LAB — MICROALBUMIN / CREATININE URINE RATIO
CREATININE, U: 163.6 mg/dL
MICROALB/CREAT RATIO: 2 mg/g (ref 0.0–30.0)
Microalb, Ur: 3.3 mg/dL — ABNORMAL HIGH (ref 0.0–1.9)

## 2017-06-14 LAB — COMPREHENSIVE METABOLIC PANEL
ALK PHOS: 49 U/L (ref 39–117)
ALT: 105 U/L — AB (ref 0–35)
AST: 68 U/L — ABNORMAL HIGH (ref 0–37)
Albumin: 4 g/dL (ref 3.5–5.2)
BILIRUBIN TOTAL: 0.4 mg/dL (ref 0.2–1.2)
BUN: 12 mg/dL (ref 6–23)
CO2: 28 meq/L (ref 19–32)
Calcium: 9.4 mg/dL (ref 8.4–10.5)
Chloride: 99 mEq/L (ref 96–112)
Creatinine, Ser: 0.62 mg/dL (ref 0.40–1.20)
GFR: 107.91 mL/min (ref >60.00)
GLUCOSE: 314 mg/dL — AB (ref 70–99)
Potassium: 4.2 mEq/L (ref 3.5–5.1)
SODIUM: 135 meq/L (ref 135–145)
TOTAL PROTEIN: 7.1 g/dL (ref 6.0–8.3)

## 2017-06-14 LAB — HEMOGLOBIN A1C: Hgb A1c MFr Bld: 8.6 % — ABNORMAL HIGH (ref 4.6–6.5)

## 2017-06-14 MED ORDER — DEXTROAMPHETAMINE SULFATE ER 10 MG PO CP24: ORAL_CAPSULE | ORAL | 0 refills | Status: DC

## 2017-06-14 MED ORDER — CLONAZEPAM 0.5 MG PO TABS: ORAL_TABLET | ORAL | 5 refills | Status: DC

## 2017-06-14 NOTE — Progress Notes (Signed)
OFFICE VISIT  06/22/2017   CC:  Chief Complaint  Patient presents with  . Follow-up    RCI, pt is not fasting.   HPI:    Patient is a 52 y.o. Caucasian female who presents accompanied by her mother for f/u DM 2, HTN, hyperlipidemia, recent R groin abscess, and adult ADD  Also new c/o insomnia.  Has not taken her meds today.  Says the metformin has been causing dyspepsia sx's--takes it irregularly lately.  Adult ADD: Takes this less than daily basis.  Pt states all is going well with the med at current dosing: much improved focus, concentration, task completion.  Less frustration, better multitasking, less impulsivity and restlessness.  Mood is stable. No side effects from the medication.  DM: no home gluc monitoring.  Polydipsia but no polyuria.  Chronic dry mouth.   No burning, tingling, or numbness in feet unless she doesn't take her gabapentin.  HTN: No home bp monitoring lately--says usually 130/70 avg.  Compliant with meds except for today.  HLD: compliant with med w/out side effects.  ROS: trouble sleeping lately, on and off for 2-3 yrs.  Is on requip for RLS and this does not bother her now. Worry and anxiety play a huge role.  Has tried benadryl and this helped but it made her a bit goofy the next day.  Ambien in the past: she made macaroni and cheese in her sleep. Clonaz at low dose has helped her in the past.  Recent L groin boil that was lanced about 9d ago, she was put on doxy but she didn't finish this b/c it caused signif GI upset.  Past Medical History:  Diagnosis Date  . ADD (attention deficit disorder with hyperactivity)   . Allergic rhinitis   . Atypical chest pain    Myocardial perf imaging 04/04/16: Low risk stress nuclear study with prior inferior infarct and mild peri-infarct ischemia; EF 55 with mild hypokinesis of the inferior wall.  Cath normal, so stress test was FALSE POS.  . Bowel obstruction ([place]) 06-25-2010   post mastectomy  . Breast cancer ([place])  2012   left mastectomy and chemotherapy  . Cholelithiasis 03/2016   u/s: no signs of cholecystitis  . Depression   . GERD (gastroesophageal reflux disease)   . Hyperlipidemia   . Hypertension   . Mild persistent asthma    dx'd age 55 (Dr. Gwenette Greet) hx of noncompliance with controller therapy due to cost  . Morbid obesity ([place])   . NASH (nonalcoholic steatohepatitis) 02/2010   u/s confirmed 02/2010 (hx of transaminitis).  Repeat u/s 03/2016 showed no sign of cirrhosis or liver mass, just moderate hepatic steatosis.  . Neuropathy   . OSA (obstructive sleep apnea)    Dr Gwenette Greet  . Ovarian mass    resected 07/12/10  . RLS (restless legs syndrome)   . Type II or unspecified type diabetes mellitus without mention of complication, uncontrolled 05/15/2012    Past Surgical History:  Procedure Laterality Date  . BILATERAL SALPINGOOPHORECTOMY  07/12/2010   enlarged ovary  . CARDIOVASCULAR STRESS TEST  04/04/2016   Low risk stress nuclear study with prior inferior infarct and mild peri-infarct ischemia; EF 55% with mild hypokinesis of the inferior wall.  F/u cath was NORMAL, so this is suspected to be a false pos stress test (diaphragmatic attenuation)  . G 2 P 2    . LEFT HEART CATH AND CORONARY ANGIOGRAPHY N/A 04/20/2016   Normal coronaries.  EF 55-65%, normal LV function. (Stress  test prior to this was false pos due to diaphragmatic attenuation). Procedure: Left Heart Cath and Coronary Angiography;  Surgeon: Peter M Martinique, MD;  Location: [facility];  Service: Cardiovascular;  Laterality: N/A;  . MASTECTOMY  06/21/2010   Left ; Dr [facility]    Outpatient Medications Prior to Visit  Medication Sig Dispense Refill  . atorvastatin (LIPITOR) 40 MG tablet TAKE ONE TABLET BY MOUTH DAILY 30 tablet 5  . bacitracin ointment Apply 1 application topically 2 (two) times daily. 120 g 0  . Calcium Carbonate-Vitamin D (CALCIUM 600+D PO) Take 1 tablet by mouth daily.    . cetirizine (ZYRTEC) 10  MG tablet Take 10 mg by mouth daily.      . cholecalciferol (VITAMIN D) 1000 units tablet Take 1,000 Units by mouth daily.    . citalopram (CELEXA) 40 MG tablet TAKE ONE TABLET BY MOUTH DAILY 30 tablet 0  . doxycycline (VIBRAMYCIN) 100 MG capsule Take 1 capsule (100 mg total) by mouth 2 (two) times daily. 20 capsule 0  . fluticasone (CUTIVATE) 0.05 % cream Apply topically 2 (two) times daily. 30 g 1  . gabapentin (NEURONTIN) 300 MG capsule TAKE 2 CAPSULES (600 MG TOTAL) BY MOUTH 2 (TWO) TIMES DAILY. 120 capsule 5  . ibuprofen (ADVIL,MOTRIN) 200 MG tablet Take 800 mg by mouth as needed for fever or moderate pain.     . magnesium oxide (MAG-OX) 400 MG tablet Take 400 mg by mouth daily.    . Multiple Vitamin (MULTIVITAMIN) tablet Take 1 tablet by mouth daily.      Marland Kitchen PROAIR HFA 108 (90 Base) MCG/ACT inhaler INHALE 1 PUFF BY MOUTH EVERY 6 HOURS AS NEEDED FOR WHEEZING/SHORTNESS OF BREATH 8.5 each 1  . Propylene Glycol (SYSTANE BALANCE OP) Place 1 drop into both eyes 2 (two) times daily as needed.    Marland Kitchen rOPINIRole (REQUIP) 0.5 MG tablet TAKE ONE TABLET BY MOUTH DAILY 90 tablet 3  . triamterene-hydrochlorothiazide (MAXZIDE-25) 37.5-25 MG tablet TAKE 1/2 TABLET BY MOUTH EVERY DAY 45 tablet 2  . verapamil (CALAN) 120 MG tablet TAKE 0.5 TABLETS (60 MG TOTAL) BY MOUTH 2 (TWO) TIMES DAILY. 90 tablet 1  . dextroamphetamine (DEXEDRINE SPANSULE) 10 MG 24 hr capsule 2 caps po qAM 60 capsule 0  . metFORMIN (GLUCOPHAGE) 1000 MG tablet TAKE ONE TABLET BY MOUTH TWICE A DAY WITH AMEAL 60 tablet 0   No facility-administered medications prior to visit.     Allergies  Allergen Reactions  . Metoprolol     REACTION: " chest pain"  . Sulfonamide Derivatives     nausea    ROS As per HPI  PE: Blood pressure 135/86, pulse 84, temperature 99.1 F (37.3 C), temperature source Oral, resp. rate 16, height 5' 4"  (1.626 m), weight 273 lb (123.8 kg), last menstrual period 07/14/2010, SpO2 95 %. Gen: Alert, well  appearing.  Patient is oriented to person, place, time, and situation. AFFECT: pleasant, lucid thought and speech. Foot exam -normal; no swelling, tenderness or skin or vascular lesions. Color and temperature is normal.  She has no sensation on any area of her feet. Peripheral pulses are palpable. Toenails are normal. Neck: no LAD, mass, or thyromegaly. CV: RRR, no m/r/g LUNGS: CTA bilat, nonlabored. NEURO: no tremor or tics noted on observation.  Coordination intact. CN 2-12 grossly intact bilaterally, strength 5/5 in all extremeties.  No ataxia. L groin: tiny skin opening with no drainage, no fluctuance or subQ mass, no erythema or tenderness.  LABS:  Lab Results  Component Value Date   TSH 1.62 09/23/2015   Lab Results  Component Value Date   WBC 9.0 04/12/2016   HGB 13.9 04/12/2016   HCT 40.6 04/12/2016   MCV 91.9 04/12/2016   PLT 306 04/12/2016   Lab Results  Component Value Date   CREATININE 0.62 06/14/2017   BUN 12 06/14/2017   NA 135 06/14/2017   K 4.2 06/14/2017   CL 99 06/14/2017   CO2 28 06/14/2017   Lab Results  Component Value Date   ALT 105 (H) 06/14/2017   AST 68 (H) 06/14/2017   ALKPHOS 49 06/14/2017   BILITOT 0.4 06/14/2017   Lab Results  Component Value Date   CHOL 94 04/12/2016   Lab Results  Component Value Date   HDL 27 (L) 04/12/2016   Lab Results  Component Value Date   LDLCALC 44 04/12/2016   Lab Results  Component Value Date   TRIG 117 04/12/2016   Lab Results  Component Value Date   CHOLHDL 3.5 04/12/2016   Lab Results  Component Value Date   HGBA1C 8.6 (H) 06/14/2017    IMPRESSION AND PLAN:  1) DM 2: some GI intol from metformin. Feet exam normal today. A1c today. Urine microalb/cr today. Refer to ophthalmology.  2) HTN.  The current medical regimen is effective;  continue present plan and medications. Lytes/cr today.  3) Hypercholesterolemia: tolerating statin.  LDL 03/2016.  Pt not fasting today--will get FLP at  future visit. Hepatic panel today.    4) Adult ADD: The current medical regimen is effective;  continue present plan and medications. I printed rx's for dexedrin spansule 46m, 1 cap qd, #30 today for this month, May 2019, and June 2019.  Appropriate fill on/after date was noted on each rx.  5) Insomnia: restart clonaz 0.514m 1-2 qhs prn.  Controlled substance contract reviewed with patient today.  Patient signed this and it will be placed in the chart.   UDS planned for next f/u visit.  6) Recent groin abscess/cyst: I& D site looks good.  No further abx needed. Signs/symptoms to call or return for were reviewed and pt expressed understanding.  An After Visit Summary was printed and given to the patient.  FOLLOW UP: Return in about 3 months (around 09/13/2017) for annual CPE (fasting).  Signed:  PhCrissie SicklesMD           06/22/2017

## 2017-06-15 ENCOUNTER — Other Ambulatory Visit: Payer: Self-pay

## 2017-06-15 ENCOUNTER — Other Ambulatory Visit: Payer: Self-pay | Admitting: Family Medicine

## 2017-06-15 ENCOUNTER — Telehealth: Payer: Self-pay | Admitting: Family Medicine

## 2017-06-15 MED ORDER — INSULIN PEN NEEDLE 32G X 4 MM MISC: 1 refills | Status: DC

## 2017-06-15 MED ORDER — LIRAGLUTIDE 18 MG/3ML ~~LOC~~ SOPN: PEN_INJECTOR | SUBCUTANEOUS | 1 refills | Status: DC

## 2017-06-15 NOTE — Telephone Encounter (Signed)
Copied from [place] [id number]. Topic: Quick Communication - Rx Refill/Question >> Jun 15, 2017 12:33 PM Neva Seat wrote: Pharmacy is needing the prescription for the needles  BD Nano once a day /twice a day - Please give this prescription for needles today for insurance purposes.  [facility] [address], [place] [address] Phone: [phone] Fax: [phone]

## 2017-06-15 NOTE — Telephone Encounter (Signed)
Rx sent to pharmacy   

## 2017-07-02 ENCOUNTER — Other Ambulatory Visit: Payer: Self-pay | Admitting: Family Medicine

## 2017-07-13 ENCOUNTER — Other Ambulatory Visit: Payer: Self-pay | Admitting: Family Medicine

## 2017-07-16 ENCOUNTER — Other Ambulatory Visit: Payer: Self-pay | Admitting: Family Medicine

## 2017-07-16 IMAGING — NM NM MISC PROCEDURE
6 series · 36 of 36 positions shown · non-contrast
Comparison: none

[Series 1: wbr_s-proj_st stress-gsp · 6.40mm/px · 6 of 512 frames shown]
[frame 43/512]
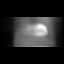
[frame 128/512]
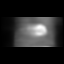
[frame 214/512]
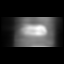
[frame 299/512]
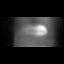
[frame 384/512]
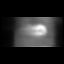
[frame 470/512]
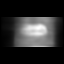

[Series 1: stress-gsp · 6.40mm/px · 6 of 509 frames shown]
[frame 43/509  full-range]
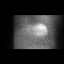
[frame 127/509  full-range]
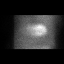
[frame 212/509  full-range]
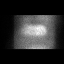
[frame 297/509  full-range]
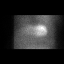
[frame 382/509  full-range]
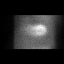
[frame 467/509  full-range]
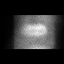

[Series 1: rest · 6.40mm/px · 6 of 64 frames shown]
[frame 6/64]
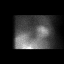
[frame 16/64]
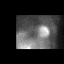
[frame 27/64]
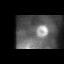
[frame 38/64]
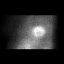
[frame 48/64]
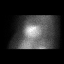
[frame 59/64]
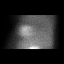

[Series 1: stress-sum-em · 6.40mm/px · 6 of 64 frames shown]
[frame 6/64]
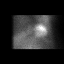
[frame 16/64]
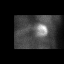
[frame 27/64]
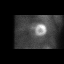
[frame 38/64]
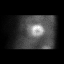
[frame 48/64]
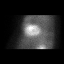
[frame 59/64]
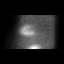

[Series 1: wbr_s-proj_st stress-sum-em · 6.40mm/px · 6 of 64 frames shown]
[frame 6/64]
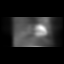
[frame 16/64]
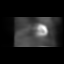
[frame 27/64]
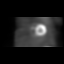
[frame 38/64]
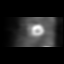
[frame 48/64]
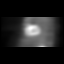
[frame 59/64]
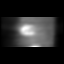

[Series 1: wbr_r-proj_st rest · 6.40mm/px · 6 of 64 frames shown]
[frame 6/64]
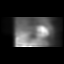
[frame 16/64]
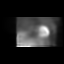
[frame 27/64]
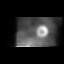
[frame 38/64]
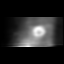
[frame 48/64]
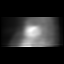
[frame 59/64]
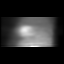

[36 of 36 positions shown; findings below may reference images not displayed]

Canned report from images found in remote index.

Refer to host system for actual result text.

## 2017-07-19 ENCOUNTER — Telehealth: Payer: Self-pay | Admitting: Family Medicine

## 2017-07-20 NOTE — Telephone Encounter (Signed)
Fax received from [employer]. Medication is covered. Letter scanned to chart and fax sent to pharmacy.

## 2017-07-20 NOTE — Telephone Encounter (Signed)
PA was sent and pharmacy will check on outcome 07/21/17. Message left on patients voice mail.

## 2017-07-20 NOTE — Telephone Encounter (Signed)
Pt is out of medication and states the pharmacist told her that she should not go without the victoza as it can cause problems. She was advised to expedite the PA by calling [phone].   Pt call back [phone]

## 2017-08-08 ENCOUNTER — Other Ambulatory Visit: Payer: Self-pay | Admitting: Family Medicine

## 2017-08-08 NOTE — Telephone Encounter (Signed)
RF request for gabapentin LOV: 06/14/17 Next ov: 10/03/17 Last written: 11/22/16 #120 w/ 5RF  Please advise. Thanks.

## 2017-08-15 ENCOUNTER — Other Ambulatory Visit: Payer: Self-pay | Admitting: Family Medicine

## 2017-08-20 ENCOUNTER — Other Ambulatory Visit: Payer: Self-pay | Admitting: Family Medicine

## 2017-08-21 NOTE — Telephone Encounter (Signed)
RF request for requip LOV: 06/14/17 Next ov: 10/03/17 Last written: 07/21/16 #90 w/ 3RF  Please advise. Thanks.

## 2017-09-25 ENCOUNTER — Other Ambulatory Visit: Payer: Self-pay | Admitting: Family Medicine

## 2017-09-25 NOTE — Telephone Encounter (Signed)
Dexedrine Spansule refill Last OV:06/14/17 Last refill:06/14/17 60 cap/0 refill TIR:[id number] Pharmacy: [facility] [address] [place] [phone] (Phone) [phone] (Fax)

## 2017-09-25 NOTE — Telephone Encounter (Signed)
Copied from [place] #[id number]. Topic: Quick Communication - Rx Refill/Question >> Sep 25, 2017 12:15 PM Oliver Pila B wrote: Medication: dextroamphetamine (DEXEDRINE SPANSULE) 10 MG 24 hr capsule [[id number]]   Has the patient contacted their pharmacy? Yes.   (Agent: If no, request that the patient contact the pharmacy for the refill.) (Agent: If yes, when and what did the pharmacy advise?)  Preferred Pharmacy (with phone number or street name): [facility]  Agent: Please be advised that RX refills may take up to 3 business days. We ask that you follow-up with your pharmacy.

## 2017-09-26 ENCOUNTER — Other Ambulatory Visit: Payer: Self-pay | Admitting: Family Medicine

## 2017-09-26 MED ORDER — DEXTROAMPHETAMINE SULFATE ER 10 MG PO CP24: ORAL_CAPSULE | ORAL | 0 refills | Status: DC

## 2017-09-26 NOTE — Telephone Encounter (Signed)
I am sending in 3 rx's for this med electronically.

## 2017-10-03 ENCOUNTER — Encounter: Payer: Self-pay | Admitting: Family Medicine

## 2017-10-03 ENCOUNTER — Ambulatory Visit (INDEPENDENT_AMBULATORY_CARE_PROVIDER_SITE_OTHER): Payer: Managed Care, Other (non HMO) | Admitting: Family Medicine

## 2017-10-03 DIAGNOSIS — E119 Type 2 diabetes mellitus without complications: Secondary | ICD-10-CM | POA: Diagnosis not present

## 2017-10-03 DIAGNOSIS — Z0000 Encounter for general adult medical examination without abnormal findings: Secondary | ICD-10-CM | POA: Diagnosis not present

## 2017-10-03 DIAGNOSIS — Z1211 Encounter for screening for malignant neoplasm of colon: Secondary | ICD-10-CM | POA: Diagnosis not present

## 2017-10-03 DIAGNOSIS — Z79899 Other long term (current) drug therapy: Secondary | ICD-10-CM | POA: Diagnosis not present

## 2017-10-03 DIAGNOSIS — I10 Essential (primary) hypertension: Secondary | ICD-10-CM | POA: Diagnosis not present

## 2017-10-03 DIAGNOSIS — R740 Nonspecific elevation of levels of transaminase and lactic acid dehydrogenase [LDH]: Secondary | ICD-10-CM | POA: Diagnosis not present

## 2017-10-03 DIAGNOSIS — F988 Other specified behavioral and emotional disorders with onset usually occurring in childhood and adolescence: Secondary | ICD-10-CM

## 2017-10-03 DIAGNOSIS — F5104 Psychophysiologic insomnia: Secondary | ICD-10-CM | POA: Diagnosis not present

## 2017-10-03 DIAGNOSIS — Z23 Encounter for immunization: Secondary | ICD-10-CM | POA: Diagnosis not present

## 2017-10-03 DIAGNOSIS — R7401 Elevation of levels of liver transaminase levels: Secondary | ICD-10-CM

## 2017-10-03 LAB — CBC WITH DIFFERENTIAL/PLATELET
BASOS ABS: 0 10*3/uL (ref 0.0–0.1)
Basophils Relative: 0.5 % (ref 0.0–3.0)
Eosinophils Absolute: 0.3 10*3/uL (ref 0.0–0.7)
Eosinophils Relative: 4.2 % (ref 0.0–5.0)
HEMATOCRIT: 45.2 % (ref 36.0–46.0)
Hemoglobin: 15.6 g/dL — ABNORMAL HIGH (ref 12.0–15.0)
LYMPHS PCT: 42.3 % (ref 12.0–46.0)
Lymphs Abs: 3.2 10*3/uL (ref 0.7–4.0)
MCHC: 34.5 g/dL (ref 30.0–36.0)
MCV: 92.3 fl (ref 78.0–100.0)
MONOS PCT: 6 % (ref 3.0–12.0)
Monocytes Absolute: 0.5 10*3/uL (ref 0.1–1.0)
Neutro Abs: 3.6 10*3/uL (ref 1.4–7.7)
Neutrophils Relative %: 47 % (ref 43.0–77.0)
Platelets: 303 10*3/uL (ref 150.0–400.0)
RBC: 4.9 Mil/uL (ref 3.87–5.11)
RDW: 13.1 % (ref 11.5–15.5)
WBC: 7.6 10*3/uL (ref 4.0–10.5)

## 2017-10-03 LAB — COMPREHENSIVE METABOLIC PANEL
ALBUMIN: 4.2 g/dL (ref 3.5–5.2)
ALT: 99 U/L — ABNORMAL HIGH (ref 0–35)
AST: 72 U/L — AB (ref 0–37)
Alkaline Phosphatase: 48 U/L (ref 39–117)
BUN: 12 mg/dL (ref 6–23)
CHLORIDE: 102 meq/L (ref 96–112)
CO2: 27 mEq/L (ref 19–32)
CREATININE: 0.64 mg/dL (ref 0.40–1.20)
Calcium: 9.8 mg/dL (ref 8.4–10.5)
GFR: 103.9 mL/min (ref >60.00)
Glucose, Bld: 161 mg/dL — ABNORMAL HIGH (ref 70–99)
POTASSIUM: 4 meq/L (ref 3.5–5.1)
SODIUM: 138 meq/L (ref 135–145)
TOTAL PROTEIN: 7.5 g/dL (ref 6.0–8.3)
Total Bilirubin: 0.6 mg/dL (ref 0.2–1.2)

## 2017-10-03 LAB — LIPID PANEL
Cholesterol: 124 mg/dL (ref 0–200)
HDL: 30 mg/dL — ABNORMAL LOW (ref >39.00)
NONHDL: 93.96
Total CHOL/HDL Ratio: 4
Triglycerides: 229 mg/dL — ABNORMAL HIGH (ref 0.0–149.0)
VLDL: 45.8 mg/dL — ABNORMAL HIGH (ref 0.0–40.0)

## 2017-10-03 LAB — HEMOGLOBIN A1C: HEMOGLOBIN A1C: 7.6 % — AB (ref 4.6–6.5)

## 2017-10-03 LAB — TSH: TSH: 1.72 u[IU]/mL (ref 0.35–4.50)

## 2017-10-03 LAB — LDL CHOLESTEROL, DIRECT: Direct LDL: 70 mg/dL

## 2017-10-03 NOTE — Progress Notes (Signed)
Office Note 10/03/2017  CC:  Chief Complaint  Patient presents with  . Annual Exam    Pt is fasting.     HPI:  [name] is a 52 y.o. White female who is here for annual health maintenance exam.  Exercise: none except housework.  She is currently out of work. Diet: has cut back on carbs.  Has trouble with motivation.  Avoiding fast foods but still going to non-fast food restaurants.  She last took dexedrine yesterday for ADHD, takes it daily. She takes clonazepam nightly. She has controlled substance contract, 06/14/17. Need to do routine UDS today.   Past Medical History:  Diagnosis Date  . ADD (attention deficit disorder with hyperactivity)   . Allergic rhinitis   . Atypical chest pain    Myocardial perf imaging 04/04/16: Low risk stress nuclear study with prior inferior infarct and mild peri-infarct ischemia; EF 55 with mild hypokinesis of the inferior wall.  Cath normal, so stress test was FALSE POS.  . Bowel obstruction ([place]) 06-25-2010   post mastectomy  . Breast cancer ([place]) 2012   left mastectomy and chemotherapy  . Cholelithiasis 03/2016   u/s: no signs of cholecystitis  . Depression   . GERD (gastroesophageal reflux disease)   . Hyperlipidemia   . Hypertension   . Mild persistent asthma    dx'd age 58 (Dr. Gwenette Greet) hx of noncompliance with controller therapy due to cost  . Morbid obesity ([place])   . NASH (nonalcoholic steatohepatitis) 02/2010   u/s confirmed 02/2010 (hx of transaminitis).  Repeat u/s 03/2016 showed no sign of cirrhosis or liver mass, just moderate hepatic steatosis.  . Neuropathy   . OSA (obstructive sleep apnea)    Dr Gwenette Greet  . Ovarian mass    resected 07/12/10  . RLS (restless legs syndrome)   . Type II or unspecified type diabetes mellitus without mention of complication, uncontrolled 05/15/2012    Past Surgical History:  Procedure Laterality Date  . BILATERAL SALPINGOOPHORECTOMY  07/12/2010   enlarged ovary  . CARDIOVASCULAR STRESS TEST   04/04/2016   Low risk stress nuclear study with prior inferior infarct and mild peri-infarct ischemia; EF 55% with mild hypokinesis of the inferior wall.  F/u cath was NORMAL, so this is suspected to be a false pos stress test (diaphragmatic attenuation)  . G 2 P 2    . LEFT HEART CATH AND CORONARY ANGIOGRAPHY N/A 04/20/2016   Normal coronaries.  EF 55-65%, normal LV function. (Stress test prior to this was false pos due to diaphragmatic attenuation). Procedure: Left Heart Cath and Coronary Angiography;  Surgeon: Peter M Martinique, MD;  Location: [facility];  Service: Cardiovascular;  Laterality: N/A;  . MASTECTOMY  06/21/2010   Left ; Dr [facility]    Family History  Adopted: Yes  Problem Relation Age of Onset  . Healthy Son        72  . Healthy Daughter        5    Social History   Socioeconomic History  . Marital status: Married    Spouse name: Not on file  . Number of children: 2  . Years of education: Not on file  . Highest education level: Not on file  Occupational History  . Occupation: Curator: [employer]  Social Needs  . Financial resource strain: Not on file  . Food insecurity:    Worry: Not on file    Inability: Not on file  .  Transportation needs:    Medical: Not on file    Non-medical: Not on file  Tobacco Use  . Smoking status: Current Every Day Smoker    Packs/day: 0.50    Years: 24.00    Pack years: 12.00  . Smokeless tobacco: Never Used  Substance and Sexual Activity  . Alcohol use: No  . Drug use: No  . Sexual activity: Not on file  Lifestyle  . Physical activity:    Days per week: Not on file    Minutes per session: Not on file  . Stress: Not on file  Relationships  . Social connections:    Talks on phone: Not on file    Gets together: Not on file    Attends religious service: Not on file    Active member of club or organization: Not on file    Attends meetings of clubs or organizations: Not on file     Relationship status: Not on file  . Intimate partner violence:    Fear of current or ex partner: Not on file    Emotionally abused: Not on file    Physically abused: Not on file    Forced sexual activity: Not on file  Other Topics Concern  . Not on file  Social History Narrative   Not working as of 09/2017.  Previously worked as a Art therapist.   Lives with husband and two children.   2-3 caffeine drinks daily        Outpatient Medications Prior to Visit  Medication Sig Dispense Refill  . albuterol (PROVENTIL HFA;VENTOLIN HFA) 108 (90 Base) MCG/ACT inhaler INHALE 1 PUFF BY MOUTH EVERY 6 HOURS AS NEEDED FOR WHEEZING/SHORTNESS OF BREATH 8.5 each 1  . atorvastatin (LIPITOR) 40 MG tablet TAKE ONE TABLET BY MOUTH DAILY 30 tablet 3  . cetirizine (ZYRTEC) 10 MG tablet Take 10 mg by mouth daily.      . citalopram (CELEXA) 40 MG tablet TAKE ONE TABLET BY MOUTH DAILY 30 tablet 5  . clonazePAM (KLONOPIN) 0.5 MG tablet 1-2  tabs po qhs prn insomnia 60 tablet 5  . dextroamphetamine (DEXEDRINE SPANSULE) 10 MG 24 hr capsule 2 caps po qAM 60 capsule 0  . fluticasone (CUTIVATE) 0.05 % cream Apply topically 2 (two) times daily. 30 g 1  . gabapentin (NEURONTIN) 300 MG capsule TAKE 2 CAPSULES (600 MG TOTAL) BY MOUTH TWO TIMES A DAY 120 capsule 6  . ibuprofen (ADVIL,MOTRIN) 200 MG tablet Take 800 mg by mouth as needed for fever or moderate pain.     . Insulin Pen Needle (BD PEN NEEDLE NANO U/F) 32G X 4 MM MISC Use with Victoza as directed once daily 100 each 1  . liraglutide (VICTOZA) 18 MG/3ML SOPN DIAL AND INJECT SUBCUTANEOUSLY 0.6MG UNDER THE SKIN EVERY MORNING FOR 7 DAYS THEN INCREASE 1.2MG DAILY 6 pen 1  . magnesium oxide (MAG-OX) 400 MG tablet Take 400 mg by mouth daily.    Marland Kitchen rOPINIRole (REQUIP) 0.5 MG tablet TAKE ONE TABLET BY MOUTH DAILY 90 tablet 3  . triamterene-hydrochlorothiazide (MAXZIDE-25) 37.5-25 MG tablet TAKE 1/2 TABLET BY MOUTH DAILY 45 tablet 1  . verapamil (CALAN) 120 MG tablet TAKE  ONE-HALF TABLET BY MOUTH TWICE A DAY 90 tablet 1  . bacitracin ointment Apply 1 application topically 2 (two) times daily. (Patient not taking: Reported on 10/03/2017) 120 g 0  . Calcium Carbonate-Vitamin D (CALCIUM 600+D PO) Take 1 tablet by mouth daily.    . cholecalciferol (VITAMIN D) 1000 units  tablet Take 1,000 Units by mouth daily.    Marland Kitchen doxycycline (VIBRAMYCIN) 100 MG capsule Take 1 capsule (100 mg total) by mouth 2 (two) times daily. (Patient not taking: Reported on 10/03/2017) 20 capsule 0  . metFORMIN (GLUCOPHAGE) 1000 MG tablet TAKE ONE TABLET BY MOUTH TWICE A DAY WITH AMEAL (Patient not taking: Reported on 10/03/2017) 60 tablet 5  . Multiple Vitamin (MULTIVITAMIN) tablet Take 1 tablet by mouth daily.      Marland Kitchen Propylene Glycol (SYSTANE BALANCE OP) Place 1 drop into both eyes 2 (two) times daily as needed.     No facility-administered medications prior to visit.     Allergies  Allergen Reactions  . Metoprolol Other (See Comments)    REACTION: " chest pain"  . Sulfonamide Derivatives     nausea    ROS Review of Systems  Constitutional: Negative for appetite change, chills, fatigue and fever.  HENT: Negative for congestion, dental problem, ear pain and sore throat.   Eyes: Negative for discharge, redness and visual disturbance.  Respiratory: Negative for cough, chest tightness, shortness of breath and wheezing.   Cardiovascular: Negative for chest pain, palpitations and leg swelling.  Gastrointestinal: Negative for abdominal pain, blood in stool, diarrhea, nausea and vomiting.  Genitourinary: Negative for difficulty urinating, dysuria, flank pain, frequency, hematuria and urgency.  Musculoskeletal: Negative for arthralgias, back pain, joint swelling, myalgias and neck stiffness.  Skin: Negative for pallor and rash.  Neurological: Negative for dizziness, speech difficulty, weakness and headaches.  Hematological: Negative for adenopathy. Does not bruise/bleed easily.   Psychiatric/Behavioral: Positive for dysphoric mood (chronic) and sleep disturbance (chronic). Negative for confusion. The patient is not nervous/anxious.     PE; Blood pressure 105/72, pulse 90, temperature 98.3 F (36.8 C), temperature source Oral, resp. rate 16, height 5' 5"  (1.651 m), weight 262 lb 6 oz (119 kg), last menstrual period 07/14/2010, SpO2 95 %. Body mass index is 43.66 kg/m. Pt examined with Etheleen Sia, nurse, as chaperone.  Gen: Alert, well appearing.  Patient is oriented to person, place, time, and situation. AFFECT: pleasant, lucid thought and speech. ENT: Ears: EACs clear, normal epithelium.  TMs with good light reflex and landmarks bilaterally.  Eyes: no injection, icteris, swelling, or exudate.  EOMI, PERRLA. Nose: no drainage or turbinate edema/swelling.  No injection or focal lesion.  Mouth: lips without lesion/swelling.  Oral mucosa pink and moist.  Dentition intact and without obvious caries or gingival swelling.  Oropharynx without erythema, exudate, or swelling.  Neck: supple/nontender.  No LAD, mass, or TM.  Carotid pulses 2+ bilaterally, without bruits. CV: RRR, no m/r/g.   LUNGS: CTA bilat, nonlabored resps, good aeration in all lung fields. ABD: soft, NT, ND, BS normal.  No hepatospenomegaly or mass.  No bruits. EXT: no clubbing, cyanosis, or edema.  Musculoskeletal: no joint swelling, erythema, warmth, or tenderness.  ROM of all joints intact. Skin - no sores or suspicious lesions or rashes or color changes   Pertinent labs:  Lab Results  Component Value Date   TSH 1.62 09/23/2015   Lab Results  Component Value Date   WBC 9.0 04/12/2016   HGB 13.9 04/12/2016   HCT 40.6 04/12/2016   MCV 91.9 04/12/2016   PLT 306 04/12/2016   Lab Results  Component Value Date   CREATININE 0.62 06/14/2017   BUN 12 06/14/2017   NA 135 06/14/2017   K 4.2 06/14/2017   CL 99 06/14/2017   CO2 28 06/14/2017   Lab Results  Component Value  Date   ALT 105  (H) 06/14/2017   AST 68 (H) 06/14/2017   ALKPHOS 49 06/14/2017   BILITOT 0.4 06/14/2017   Lab Results  Component Value Date   CHOL 94 04/12/2016   Lab Results  Component Value Date   HDL 27 (L) 04/12/2016   Lab Results  Component Value Date   LDLCALC 44 04/12/2016   Lab Results  Component Value Date   TRIG 117 04/12/2016   Lab Results  Component Value Date   CHOLHDL 3.5 04/12/2016   Lab Results  Component Value Date   HGBA1C 8.6 (H) 06/14/2017    ASSESSMENT AND PLAN:   Health maintenance exam: Reviewed age and gender appropriate health maintenance issues (prudent diet, regular exercise, health risks of tobacco and excessive alcohol, use of seatbelts, fire alarms in home, use of sunscreen).  Also reviewed age and gender appropriate health screening as well as vaccine recommendations. Vaccines: UTD.  Discussed shingrix--> #1 today. Labs: fasting HP + HbA1c (DM 2). Cervical ca screening:  Has GYN f/u with Dr. Harrington Challenger on 11/23/17. She will get DEXA through GYN as well. Breast ca screening: Scheduled for 11/13/17 via GYN office. Colon ca screening: she is due for her initial screening colonoscopy-->GI referral ordered today.  She has chronic dep/anx/insomnia/adult add---uses clonaz and dexedrine regularly.  CSC UTD. Will do UDS today: should see amphetamines and benzos.  Morbid obesity: pt struggles to stay on a healthy/calorie-restricted diet for more than a week at a time. Not exercising.  She agreed to referral today [facility] clinic.  Hx of MDD/GAD/Insomnia: not ideally controlled.   We discussed the need to make return appt at her earliest convenience to go over these problems in detail again and come up with plan of action.  Pt expressed understanding and agreed.  An After Visit Summary was printed and given to the patient.  FOLLOW UP:  Return for Make appt for mood/sleep prob's at your earliest convenience (30  min).  Signed:  Crissie Sickles, MD           10/03/2017

## 2017-10-03 NOTE — Patient Instructions (Signed)

## 2017-10-06 LAB — PAIN MGMT, PROFILE 8 W/CONF, U
6 Acetylmorphine: NEGATIVE ng/mL (ref <10)
ALCOHOL METABOLITES: NEGATIVE ng/mL (ref <500)
ALPHAHYDROXYALPRAZOLAM: NEGATIVE ng/mL (ref <25)
ALPHAHYDROXYMIDAZOLAM: NEGATIVE ng/mL (ref <50)
Alphahydroxytriazolam: NEGATIVE ng/mL (ref <50)
Aminoclonazepam: 304 ng/mL — ABNORMAL HIGH (ref <25)
Amphetamine: 5201 ng/mL — ABNORMAL HIGH (ref <250)
Amphetamines: POSITIVE ng/mL — AB (ref <500)
Benzodiazepines: POSITIVE ng/mL — AB (ref <100)
Buprenorphine, Urine: NEGATIVE ng/mL (ref <5)
COCAINE METABOLITE: NEGATIVE ng/mL (ref <150)
Creatinine: 208 mg/dL
HYDROXYETHYLFLURAZEPAM: NEGATIVE ng/mL (ref <50)
Lorazepam: NEGATIVE ng/mL (ref <50)
MDMA: NEGATIVE ng/mL (ref <500)
Marijuana Metabolite: NEGATIVE ng/mL (ref <20)
Methamphetamine: NEGATIVE ng/mL (ref <250)
NORDIAZEPAM: NEGATIVE ng/mL (ref <50)
Opiates: NEGATIVE ng/mL (ref <100)
Oxazepam: NEGATIVE ng/mL (ref <50)
Oxidant: NEGATIVE ug/mL (ref <200)
Oxycodone: NEGATIVE ng/mL (ref <100)
Temazepam: NEGATIVE ng/mL (ref <50)
pH: 6.84 (ref 4.5–9.0)

## 2017-10-11 ENCOUNTER — Ambulatory Visit: Payer: Managed Care, Other (non HMO) | Admitting: Family Medicine

## 2017-10-11 ENCOUNTER — Encounter: Payer: Self-pay | Admitting: Family Medicine

## 2017-10-11 VITALS — BP 103/72 | HR 93 | Temp 98.7°F | Resp 16 | Ht 65.0 in | Wt 258.5 lb

## 2017-10-11 DIAGNOSIS — G4733 Obstructive sleep apnea (adult) (pediatric): Secondary | ICD-10-CM | POA: Diagnosis not present

## 2017-10-11 DIAGNOSIS — F411 Generalized anxiety disorder: Secondary | ICD-10-CM

## 2017-10-11 DIAGNOSIS — F5101 Primary insomnia: Secondary | ICD-10-CM

## 2017-10-11 DIAGNOSIS — F341 Dysthymic disorder: Secondary | ICD-10-CM | POA: Diagnosis not present

## 2017-10-11 MED ORDER — SUVOREXANT 10 MG PO TABS: 1.0000 | ORAL_TABLET | Freq: Every day | ORAL | 1 refills | Status: DC

## 2017-10-11 NOTE — Progress Notes (Signed)
OFFICE VISIT  10/11/2017   CC:  Chief Complaint  Patient presents with  . Follow-up    Mood and Sleep   HPI:    Patient is a 52 y.o. Caucasian female who presents for depressed mood and sleeping problems. (Currently taking citalopram 40 mg qd and clonaz 0.5-1 mg qhs prn).  Takes dexedrine only in the morning.  She has dysthymia, not really sure if worse recently, just knows that inability to sleep at night is her worst problem and this is likely making her mood worse.  No SI or HI. Says the biggest issue is her guilt over not working, staying home and taking care of her ailing parents. Feels like she's always felt some depression.  Lays in bed hours before going to sleep at night, has RLS but this has only been minimally bothersome in the past but not really now, requip has helped.   Hx of mild OSA, got sleep study approx 2005.  Has CPAP but hasn't used it in years b/c it was not very tolerable and she felt like it never got titrated adequately.  She never followed up with sleep MD.   Sleep is erratic as far as duration--sometimes NO sleep at all, usually the night before she has a stressful day anticipated. The most she sleeps at night is 4 hours.  Next day sleeps all day. Very confusing whether or not she has excessive daytime somnolence. Says when she does sleep she sleeps very deeply, can't awaken to alarm, would often oversleep and miss work when she was working.  Ambien trial in the past--->sleep cooking.  Is adopted, has some abandonment issues that rear their head sometimes. Not motivated to do anything, tends to watch TV a lot when not caring for her parents. Describes poor sleep hygiene at times, other times fine. Her difficulty sleeping does seem to wax and wane in severity--in an unpredictable way. Sleep has been better the last 1 week!   Past Medical History:  Diagnosis Date  . ADD (attention deficit disorder with hyperactivity)   . Allergic rhinitis   . Atypical chest  pain    Myocardial perf imaging 04/04/16: Low risk stress nuclear study with prior inferior infarct and mild peri-infarct ischemia; EF 55 with mild hypokinesis of the inferior wall.  Cath normal, so stress test was FALSE POS.  . Bowel obstruction ([place]) 06-25-2010   post mastectomy  . Breast cancer ([place]) 2012   left mastectomy and chemotherapy  . Cholelithiasis 03/2016   u/s: no signs of cholecystitis  . Depression   . GERD (gastroesophageal reflux disease)   . Hyperlipidemia   . Hypertension   . Mild persistent asthma    dx'd age 15 (Dr. Gwenette Greet) hx of noncompliance with controller therapy due to cost  . Morbid obesity ([place])   . NASH (nonalcoholic steatohepatitis) 02/2010   u/s confirmed 02/2010 (hx of transaminitis).  Repeat u/s 03/2016 showed no sign of cirrhosis or liver mass, just moderate hepatic steatosis.  . Neuropathy   . OSA (obstructive sleep apnea)    Dr Gwenette Greet  . Ovarian mass    resected 07/12/10  . RLS (restless legs syndrome)   . Type II or unspecified type diabetes mellitus without mention of complication, uncontrolled 05/15/2012    Past Surgical History:  Procedure Laterality Date  . BILATERAL SALPINGOOPHORECTOMY  07/12/2010   enlarged ovary  . CARDIOVASCULAR STRESS TEST  04/04/2016   Low risk stress nuclear study with prior inferior infarct and mild peri-infarct ischemia; EF  55% with mild hypokinesis of the inferior wall.  F/u cath was NORMAL, so this is suspected to be a false pos stress test (diaphragmatic attenuation)  . G 2 P 2    . LEFT HEART CATH AND CORONARY ANGIOGRAPHY N/A 04/20/2016   Normal coronaries.  EF 55-65%, normal LV function. (Stress test prior to this was false pos due to diaphragmatic attenuation). Procedure: Left Heart Cath and Coronary Angiography;  Surgeon: Peter M Martinique, MD;  Location: [facility];  Service: Cardiovascular;  Laterality: N/A;  . MASTECTOMY  06/21/2010   Left ; Dr [facility]    Outpatient Medications Prior to Visit   Medication Sig Dispense Refill  . albuterol (PROVENTIL HFA;VENTOLIN HFA) 108 (90 Base) MCG/ACT inhaler INHALE 1 PUFF BY MOUTH EVERY 6 HOURS AS NEEDED FOR WHEEZING/SHORTNESS OF BREATH 8.5 each 1  . atorvastatin (LIPITOR) 40 MG tablet TAKE ONE TABLET BY MOUTH DAILY 30 tablet 3  . CALCIUM-VITAMIN D PO Take by mouth.    . cetirizine (ZYRTEC) 10 MG tablet Take 10 mg by mouth daily.      . citalopram (CELEXA) 40 MG tablet TAKE ONE TABLET BY MOUTH DAILY 30 tablet 5  . clonazePAM (KLONOPIN) 0.5 MG tablet 1-2  tabs po qhs prn insomnia 60 tablet 5  . dextroamphetamine (DEXEDRINE SPANSULE) 10 MG 24 hr capsule 2 caps po qAM 60 capsule 0  . fluticasone (CUTIVATE) 0.05 % cream Apply topically 2 (two) times daily. 30 g 1  . gabapentin (NEURONTIN) 300 MG capsule TAKE 2 CAPSULES (600 MG TOTAL) BY MOUTH TWO TIMES A DAY 120 capsule 6  . ibuprofen (ADVIL,MOTRIN) 200 MG tablet Take 800 mg by mouth as needed for fever or moderate pain.     . Insulin Pen Needle (BD PEN NEEDLE NANO U/F) 32G X 4 MM MISC Use with Victoza as directed once daily 100 each 1  . liraglutide (VICTOZA) 18 MG/3ML SOPN DIAL AND INJECT SUBCUTANEOUSLY 0.6MG UNDER THE SKIN EVERY MORNING FOR 7 DAYS THEN INCREASE 1.2MG DAILY 6 pen 1  . magnesium oxide (MAG-OX) 400 MG tablet Take 400 mg by mouth daily.    . Multiple Vitamin (MULTIVITAMIN) tablet Take 1 tablet by mouth daily.    Marland Kitchen rOPINIRole (REQUIP) 0.5 MG tablet TAKE ONE TABLET BY MOUTH DAILY 90 tablet 3  . triamterene-hydrochlorothiazide (MAXZIDE-25) 37.5-25 MG tablet TAKE 1/2 TABLET BY MOUTH DAILY 45 tablet 1  . verapamil (CALAN) 120 MG tablet TAKE ONE-HALF TABLET BY MOUTH TWICE A DAY 90 tablet 1   No facility-administered medications prior to visit.     Allergies  Allergen Reactions  . Metoprolol Other (See Comments)    REACTION: " chest pain"  . Sulfonamide Derivatives     nausea    ROS As per HPI  PE: Blood pressure 103/72, pulse 93, temperature 98.7 F (37.1 C), temperature  source Oral, resp. rate 16, height 5' 5"  (1.651 m), weight 258 lb 8 oz (117.3 kg), last menstrual period 07/14/2010, SpO2 96 %. Gen: Alert, well appearing.  Patient is oriented to person, place, time, and situation. AFFECT: pleasant, lucid thought and speech. No further exam today.  LABS:    Chemistry      Component Value Date/Time   NA 138 10/03/2017 1018   NA 141 01/15/2015 0911   K 4.0 10/03/2017 1018   K 3.6 01/15/2015 0911   CL 102 10/03/2017 1018   CO2 27 10/03/2017 1018   CO2 27 01/15/2015 0911   BUN 12 10/03/2017 1018  BUN 17.2 01/15/2015 0911   CREATININE 0.64 10/03/2017 1018   CREATININE 0.67 04/12/2016 1311   CREATININE 0.8 01/15/2015 0911      Component Value Date/Time   CALCIUM 9.8 10/03/2017 1018   CALCIUM 10.7 (H) 01/15/2015 0911   ALKPHOS 48 10/03/2017 1018   ALKPHOS 42 01/15/2015 0911   AST 72 (H) 10/03/2017 1018   AST 64 (H) 01/15/2015 0911   ALT 99 (H) 10/03/2017 1018   ALT 153 (H) 01/15/2015 0911   BILITOT 0.6 10/03/2017 1018   BILITOT 0.57 01/15/2015 0911     Lab Results  Component Value Date   TSH 1.72 10/03/2017     IMPRESSION AND PLAN:  She is complex with regard to her sleep problems:  1) Primary insomnia: sounds like her RLS is well controlled. Anxiety playing a big role here, as is underlying mood disorder.  Also, it is tough to get a sense of whether or not she is simply getting her restorative sleep in the daytime with naps and then at night has less need for sleep. With her hx of mild OSA, I would like to save an increase in her clonaz or different benzo trial as a last resort. She is intolerant of ambien--given her rxn to this med (sleep cooking), I think she is too high risk for similar reaction if she tried [place]. rozarem, or sonata. Will rx belsomra trial.  If not approved/cost effective then we'll increase her clonaz to 63m qhs.  2) Hx of mild OSA by sleep study in 2006.    Pt did not f/u with sleep MD as appropriate. She  never really did an adequate trial of CPAP. Still snores and has apneic events in sleep per family. Refer to neurology for consideration of another sleep study  Spent 25 min with pt today, with >50% of this time spent in counseling and care coordination regarding the above problems.  An After Visit Summary was printed and given to the patient.  FOLLOW UP: To be determined based on results of pending rx trials and referral workup.  Signed:  PCrissie Sickles MD           10/11/2017

## 2017-10-27 ENCOUNTER — Other Ambulatory Visit: Payer: Self-pay | Admitting: Family Medicine

## 2017-11-08 ENCOUNTER — Encounter: Payer: Self-pay | Admitting: Licensed Clinical Social Worker

## 2017-11-09 ENCOUNTER — Other Ambulatory Visit: Payer: Self-pay | Admitting: Family Medicine

## 2017-11-12 ENCOUNTER — Other Ambulatory Visit: Payer: Self-pay | Admitting: Family Medicine

## 2017-11-15 LAB — HM DIABETES EYE EXAM

## 2017-11-16 ENCOUNTER — Institutional Professional Consult (permissible substitution): Payer: Managed Care, Other (non HMO) | Admitting: Pulmonary Disease

## 2017-12-07 ENCOUNTER — Encounter: Payer: Self-pay | Admitting: Gastroenterology

## 2017-12-13 ENCOUNTER — Encounter: Payer: Self-pay | Admitting: Pulmonary Disease

## 2017-12-13 ENCOUNTER — Ambulatory Visit (INDEPENDENT_AMBULATORY_CARE_PROVIDER_SITE_OTHER): Payer: Managed Care, Other (non HMO) | Admitting: Pulmonary Disease

## 2017-12-13 VITALS — BP 128/82 | HR 104 | Ht 65.0 in | Wt 251.4 lb

## 2017-12-13 DIAGNOSIS — Z87898 Personal history of other specified conditions: Secondary | ICD-10-CM | POA: Diagnosis not present

## 2017-12-13 DIAGNOSIS — G4719 Other hypersomnia: Secondary | ICD-10-CM | POA: Diagnosis not present

## 2017-12-13 DIAGNOSIS — Z23 Encounter for immunization: Secondary | ICD-10-CM | POA: Diagnosis not present

## 2017-12-13 DIAGNOSIS — G4733 Obstructive sleep apnea (adult) (pediatric): Secondary | ICD-10-CM

## 2017-12-13 NOTE — Patient Instructions (Signed)
History of obstructive sleep apnea  Worsening symptoms Restless leg syndrome on Requip  We will order an in lab polysomnogram A split-night study will be appropriate  I will see you back in the office in about 2 to 3 months following initiation of treatment

## 2017-12-13 NOTE — Progress Notes (Signed)
[name]    [id number]    1965/11/23  Primary Care Physician:McGowen, Adrian Blackwater, MD  Referring Physician: Tammi Sou, MD [address] [place], [place] [address]  Chief complaint:   History of obstructive sleep apnea Restless legs  HPI:  History of obstructive sleep apnea, did not tolerate CPAP in the past-felt smothered She does not remember going to a titration study at the time She did have severe leg movements She does have symptoms suggestive of restless legs for which she has been on Requip  She has had increased snoring, increased daytime sleepiness Usually goes to bed between 10 and 11 PM Does take a little bit of time to fall asleep  Tries to get up in the morning by 10  Occupation: No pertinent occupational history Exposures: No significant exposure Smoking history: Never smoker  Outpatient Encounter Medications as of 12/13/2017  Medication Sig  . albuterol (PROVENTIL HFA;VENTOLIN HFA) 108 (90 Base) MCG/ACT inhaler INHALE 1 PUFF BY MOUTH EVERY 6 HOURS AS NEEDED FOR WHEEZING/SHORTNESS OF BREATH  . atorvastatin (LIPITOR) 40 MG tablet TAKE ONE TABLET BY MOUTH DAILY  . CALCIUM-VITAMIN D PO Take by mouth.  . cetirizine (ZYRTEC) 10 MG tablet Take 10 mg by mouth daily.    . citalopram (CELEXA) 40 MG tablet TAKE ONE TABLET BY MOUTH DAILY  . clonazePAM (KLONOPIN) 0.5 MG tablet 1-2  tabs po qhs prn insomnia  . dextroamphetamine (DEXEDRINE SPANSULE) 10 MG 24 hr capsule 2 caps po qAM  . fluticasone (CUTIVATE) 0.05 % cream Apply topically 2 (two) times daily.  Marland Kitchen gabapentin (NEURONTIN) 300 MG capsule TAKE 2 CAPSULES (600 MG TOTAL) BY MOUTH TWO TIMES A DAY  . ibuprofen (ADVIL,MOTRIN) 200 MG tablet Take 800 mg by mouth as needed for fever or moderate pain.   . Insulin Pen Needle (BD PEN NEEDLE NANO U/F) 32G X 4 MM MISC Use with Victoza as directed once daily  . liraglutide (VICTOZA) 18 MG/3ML SOPN DIAL AND INJECT SUBCUTANEOUSLY 0.6MG UNDER THE SKIN EVERY  MORNING FOR 7 DAYS THEN INCREASE 1.2MG DAILY  . magnesium oxide (MAG-OX) 400 MG tablet Take 400 mg by mouth daily.  . Multiple Vitamin (MULTIVITAMIN) tablet Take 1 tablet by mouth daily.  Marland Kitchen rOPINIRole (REQUIP) 0.5 MG tablet TAKE ONE TABLET BY MOUTH DAILY  . Suvorexant (BELSOMRA) 10 MG TABS Take 1 tablet by mouth at bedtime.  . triamterene-hydrochlorothiazide (MAXZIDE-25) 37.5-25 MG tablet TAKE 1/2 TABLET BY MOUTH DAILY  . verapamil (CALAN) 120 MG tablet TAKE ONE-HALF TABLET BY MOUTH TWICE A DAY   No facility-administered encounter medications on file as of 12/13/2017.     Allergies as of 12/13/2017 - Review Complete 12/13/2017  Allergen Reaction Noted  . Metoprolol Other (See Comments) 01/21/2007  . Sulfonamide derivatives  01/21/2007    Past Medical History:  Diagnosis Date  . ADD (attention deficit disorder with hyperactivity)   . Allergic rhinitis   . Atypical chest pain    Myocardial perf imaging 04/04/16: Low risk stress nuclear study with prior inferior infarct and mild peri-infarct ischemia; EF 55 with mild hypokinesis of the inferior wall.  Cath normal, so stress test was FALSE POS.  . Bowel obstruction ([place]) 06-25-2010   post mastectomy  . Breast cancer ([place]) 2012   left mastectomy and chemotherapy  . Cholelithiasis 03/2016   u/s: no signs of cholecystitis  . Depression   . GERD (gastroesophageal reflux disease)   . Hyperlipidemia   .  Hypertension   . Mild persistent asthma    dx'd age 42 (Dr. Gwenette Greet) hx of noncompliance with controller therapy due to cost  . Morbid obesity ([place])   . NASH (nonalcoholic steatohepatitis) 02/2010   u/s confirmed 02/2010 (hx of transaminitis).  Repeat u/s 03/2016 showed no sign of cirrhosis or liver mass, just moderate hepatic steatosis.  . Neuropathy   . OSA (obstructive sleep apnea)    Dr Gwenette Greet  . Ovarian mass    resected 07/12/10  . RLS (restless legs syndrome)   . Type II or unspecified type diabetes mellitus without mention of  complication, uncontrolled 05/15/2012    Past Surgical History:  Procedure Laterality Date  . BILATERAL SALPINGOOPHORECTOMY  07/12/2010   enlarged ovary  . CARDIOVASCULAR STRESS TEST  04/04/2016   Low risk stress nuclear study with prior inferior infarct and mild peri-infarct ischemia; EF 55% with mild hypokinesis of the inferior wall.  F/u cath was NORMAL, so this is suspected to be a false pos stress test (diaphragmatic attenuation)  . G 2 P 2    . LEFT HEART CATH AND CORONARY ANGIOGRAPHY N/A 04/20/2016   Normal coronaries.  EF 55-65%, normal LV function. (Stress test prior to this was false pos due to diaphragmatic attenuation). Procedure: Left Heart Cath and Coronary Angiography;  Surgeon: Peter M Martinique, MD;  Location: [facility];  Service: Cardiovascular;  Laterality: N/A;  . MASTECTOMY  06/21/2010   Left ; Dr [facility]    Family History  Adopted: Yes  Problem Relation Age of Onset  . Healthy Son        52  . Healthy Daughter        5    Social History   Socioeconomic History  . Marital status: Married    Spouse name: Not on file  . Number of children: 2  . Years of education: Not on file  . Highest education level: Not on file  Occupational History  . Occupation: Curator: artist [employer]  Social Needs  . Financial resource strain: Not on file  . Food insecurity:    Worry: Not on file    Inability: Not on file  . Transportation needs:    Medical: Not on file    Non-medical: Not on file  Tobacco Use  . Smoking status: Current Every Day Smoker    Packs/day: 0.50    Years: 24.00    Pack years: 12.00  . Smokeless tobacco: Never Used  Substance and Sexual Activity  . Alcohol use: No  . Drug use: No  . Sexual activity: Not on file  Lifestyle  . Physical activity:    Days per week: Not on file    Minutes per session: Not on file  . Stress: Not on file  Relationships  . Social connections:    Talks on phone: Not on file    Gets  together: Not on file    Attends religious service: Not on file    Active member of club or organization: Not on file    Attends meetings of clubs or organizations: Not on file    Relationship status: Not on file  . Intimate partner violence:    Fear of current or ex partner: Not on file    Emotionally abused: Not on file    Physically abused: Not on file    Forced sexual activity: Not on file  Other Topics Concern  . Not on file  Social History  Narrative   Not working as of 09/2017.  Previously worked as a Art therapist.   Lives with husband and two children.   2-3 caffeine drinks daily        Review of Systems  Constitutional: Negative.   HENT: Negative.   Respiratory: Positive for apnea.   Cardiovascular: Negative.   Gastrointestinal: Negative.   Musculoskeletal: Negative.   Psychiatric/Behavioral: Positive for sleep disturbance.    Vitals:   12/13/17 1601  BP: 128/82  Pulse: (!) 104  SpO2: 97%    Physical Exam  Constitutional: She is oriented to person, place, and time. She appears well-developed and well-nourished.  HENT:  Head: Normocephalic and atraumatic.  Crowded oropharynx  Eyes: Pupils are equal, round, and reactive to light. Conjunctivae and EOM are normal. Right eye exhibits no discharge. Left eye exhibits no discharge.  Neck: Normal range of motion. Neck supple. No tracheal deviation present. No thyromegaly present.  Cardiovascular: Normal rate and regular rhythm.  Pulmonary/Chest: Effort normal and breath sounds normal. No respiratory distress. She has no wheezes. She has no rales.  Abdominal: Soft. Bowel sounds are normal. She exhibits no distension. There is no tenderness.  Musculoskeletal: Normal range of motion. She exhibits no edema or deformity.  Neurological: She is alert and oriented to person, place, and time. She has normal reflexes. No cranial nerve deficit.  Skin: Skin is warm and dry. No erythema.  Psychiatric: She has a normal mood and  affect.   Epworth sleepiness of 13  Assessment:  High probability of significant sleep disordered breathing  Restless leg syndrome  Nonrestorative sleep  Plan/Recommendations:  We will schedule an in lab polysomnogram-a split-night study will be appropriate  Pathophysiology of sleep disordered breathing discussed  Treatment options for significant sleep disordered breathing discussed  Importance of weight management discussed  I will see her back in the office in about 2 to 3 months following initiation of treatment   Sherrilyn Rist MD [facility] 12/13/2017, 4:21 PM  CC: McGowen, Adrian Blackwater, MD

## 2017-12-19 ENCOUNTER — Ambulatory Visit: Payer: Managed Care, Other (non HMO)

## 2017-12-19 ENCOUNTER — Ambulatory Visit (INDEPENDENT_AMBULATORY_CARE_PROVIDER_SITE_OTHER): Payer: Managed Care, Other (non HMO) | Admitting: Family Medicine

## 2017-12-19 DIAGNOSIS — Z23 Encounter for immunization: Secondary | ICD-10-CM | POA: Diagnosis not present

## 2017-12-19 NOTE — Progress Notes (Signed)
Patient presented to office today to get second Shingirx vaccine.  Patient tolerated well.

## 2017-12-23 ENCOUNTER — Emergency Department (HOSPITAL_COMMUNITY)
Admission: EM | Admit: 2017-12-23 | Discharge: 2017-12-23 | Discharge status: Absent without leave | Payer: Managed Care, Other (non HMO)

## 2017-12-23 ENCOUNTER — Ambulatory Visit: Payer: Managed Care, Other (non HMO) | Attending: Pulmonary Disease | Admitting: Pulmonary Disease

## 2017-12-23 DIAGNOSIS — G4733 Obstructive sleep apnea (adult) (pediatric): Secondary | ICD-10-CM | POA: Diagnosis present

## 2017-12-26 ENCOUNTER — Encounter: Payer: Self-pay | Admitting: Family Medicine

## 2017-12-30 ENCOUNTER — Encounter: Payer: Self-pay | Admitting: Family Medicine

## 2017-12-31 ENCOUNTER — Other Ambulatory Visit: Payer: Self-pay | Admitting: Family Medicine

## 2017-12-31 NOTE — Telephone Encounter (Signed)
RF request for clonazepam LOV: 10/11/17 Next ov: None Last written: 06/14/17 #60 w/ 5RF  Please advise. Thanks.

## 2018-01-03 ENCOUNTER — Telehealth: Payer: Self-pay | Admitting: Internal Medicine

## 2018-01-03 DIAGNOSIS — G4733 Obstructive sleep apnea (adult) (pediatric): Secondary | ICD-10-CM

## 2018-01-03 NOTE — Telephone Encounter (Signed)
Patient aware of results and verbalized understanding and order has been placed and apt made nothing further needed at this time.

## 2018-01-03 NOTE — Telephone Encounter (Signed)
Sleep study result  Date of study  12/23/2017  DME referral  CPAP of 11 with Heated humidification  Mask of choice to be fitted by DME company  Will follow-up in office as scheduled

## 2018-01-03 NOTE — Procedures (Signed)
POLYSOMNOGRAPHY  Last, First: [name], [name] MRN: [id number] Gender: Female Age (years): 52 Weight (lbs): 251 DOB: [birth year] BMI: 42 Primary Care: No PCP Epworth Score: 10 Referring: Laurin Coder MD Technician: Rosebud Poles Interpreting: Laurin Coder MD Study Type: Split Night CPAP Ordered Study Type: Split Night CPAP Study date: 12/23/2017 Location: [place] CLINICAL INFORMATION [name] is a 52 year old Female and was referred to the sleep center for evaluation of N/A. Indications include OSA.  MEDICATIONS Patient self administered medications include: N/A. Medications administered during study include No sleep medicine administered.  SLEEP STUDY TECHNIQUE The patient underwent an attended overnight level one polysomnography titration to assess the effects of CPAP therapy. The following variables were monitored: EEG (C4-A1, C3-A2, O1-A2, O2-A1), EOG, submental and leg EMG, ECG, oxyhemoglobin saturation by pulse oximetry, thoracic and abdominal respiratory effort belts, nasal/oral airflow by pressure sensor, body position sensor and snoring sensor. CPAP pressure was titrated to eliminate apneas, hypopneas and oxygen desaturation. Hypopneas were scored per AASM definition IB (4% desaturation)  The NPSG portion of the study ended at 12:13:47 AM . The CPAP titration was initiated at 12:27:42 AM AM with the CPAP portion of the study ending at 5:14:42 AM.  TECHNICIAN COMMENTS Comments added by Technician: Patient met Split night protocol with an AHI of 78.9/hr within 2-3 hrs of TST. Moderate to loud snoring noticed pre-CPAP. CPAP therapy started at 4 cm of H20 and increased to 12 cm of H20. Patient tolerated CPAP very well Comments added by Scorer: N/A SLEEP ARCHITECTURE The recording time for the entire night was 452.5 minutes. The diagnostic portion was initiated at 9:42:15 PM and terminated at 12:13:47 AM. The time in bed was 151.5 minutes. EEG confirmed total  sleep time was 142.5 minutes yielding a sleep efficiency of 94.0%%. Sleep onset after lights out was 6.8 minutes with a REM latency of N/A minutes. The patient spent 1.1%% of the night in stage N1 sleep, 38.6%% in stage N2 sleep, 60.4%% in stage N3 and 0% in REM. The Arousal Index was 9.7/hour.  The titration portion was initiated at 12:27:42 AM and terminated at 5:14:42 AM. The time in bed was 287.0 minutes. EEG confirmed total sleep time was 270.7 minutes yielding a sleep efficiency of 94.3%%. Sleep onset after CPAP initiation was 10.8 minutes with a REM latency of 50.5 minutes. The patient spent 1.1%% of the night in stage N1 sleep, 47.7%% in stage N2 sleep, 28.9%% in stage N3 and 22.4% in REM. The Arousal Index was 14.9/hour. RESPIRATORY PARAMETERS During the diagnostic portion, there were a total of 188 respiratory disturbances recorded; 9 apneas ( 9 obstructive, 0 mixed, 0 central), 178 hypopneas and 1 RERAs. The apnea/hypopnea index 78.7 was events/hour and the RDI was 79.2 events/hour. The central sleep apnea index was 0.0 events/hour. The REM AHI was N/A/h and NREM AHI was 78.7/h. The REM RDI was 0.0/h and NREM RDI was 79.2/h. The supine AHI was 78.7/h, and the non supine AHI was 0/h; supine during 100.0%% of sleep. The supine RDI was 79.2 /h, and the non supine RDI was N/A/h. Respiratory disturbances were associated with oxygen desaturation down to a nadir of 83.0% during sleep. The mean oxygen saturation during the study was 91.5%. The cumulative time under 88% oxygen saturation was 10.6 minutes.  During the titration portion, the apnea/hypopnea index (AHI) was 27.7 events/hour and the RDI was 27.7 events/hour. The central sleep apnea index was events/hour. The most appropriate setting of CPAP was not determined. Respiratory events  persisted at all settings. Supplemental oxygen was not administered during the study. LEG MOVEMENT DATA The periodic limb movement index was 0.0/hour with an associated  arousal index of /hour. CARDIAC DATA The underlying cardiac rhythm was most consistent with sinus rhythm. Mean heart rate was 77.5 during diagnostic portion and 80.3 during titration portion of study. Additional rhythm abnormalities include PVCs.   IMPRESSIONS - Severe Obstructive Sleep apnea(OSA)  - Electrocardiographic data showed presence of PVCs. - No Significant Central Sleep Apnea (CSA) - Moderate Oxygen Desaturation - The patient snored with loud snoring volume. - EEG did not show alpha intrusion. - No significant periodic leg movements(PLMs) during sleep.  - Normal sleep efficiency, normal primary sleep latency, no REM sleep latency and long slow wave latency.   DIAGNOSIS - Obstructive Sleep Apnea (327.23 [G47.33 ICD-10])   RECOMMENDATIONS - Recommend CPAP 11CM of H2O. - Close clinical follow-up for optimization of care - Avoid alcohol, sedatives and other CNS depressants that may worsen sleep apnea and disrupt normal sleep architecture. - Sleep hygiene should be reviewed to assess factors that may improve sleep quality. - Weight management and regular exercise should be initiated or continued. - Return to Sleep Center for re-evaluation after 4 weeks of therapy  [Electronically signed] 01/03/2018 03:18 PM  Sherrilyn Rist MD NPI: [id number]

## 2018-01-08 ENCOUNTER — Encounter: Payer: Self-pay | Admitting: Family Medicine

## 2018-01-14 ENCOUNTER — Encounter: Payer: Managed Care, Other (non HMO) | Admitting: Gastroenterology

## 2018-01-16 ENCOUNTER — Telehealth: Payer: Self-pay | Admitting: Pulmonary Disease

## 2018-01-16 NOTE — Telephone Encounter (Signed)
Rodena Piety please advise if you have these forms, thank you.

## 2018-01-16 NOTE — Telephone Encounter (Signed)
They faxed it here yesterday but the patients name was incorrect. They resent again and cover sheet was right but actual signing sheet still was incorrect. Chrystal from [employer] just brought in a new copy and 2nd page still has the name misspelled

## 2018-01-17 NOTE — Telephone Encounter (Signed)
Is there anything clinical needs to do about this?

## 2018-01-17 NOTE — Telephone Encounter (Signed)
No I'm getting Dr. Ander Slade to sign after lunch

## 2018-01-18 NOTE — Telephone Encounter (Signed)
Form has been signed and faxed. I received confirmation that it was received

## 2018-01-28 ENCOUNTER — Encounter: Payer: Self-pay | Admitting: Family Medicine

## 2018-02-11 ENCOUNTER — Other Ambulatory Visit: Payer: Self-pay | Admitting: Family Medicine

## 2018-02-11 NOTE — Telephone Encounter (Signed)
Copied from [place] [id number]. Topic: Quick Communication - Rx Refill/Question >> Feb 11, 2018 12:59 PM Selinda Flavin B, NT wrote: Medication: albuterol (PROVENTIL HFA;VENTOLIN HFA) 108 (90 Base) MCG/ACT inhaler   Has the patient contacted their pharmacy? Yes.   (Agent: If no, request that the patient contact the pharmacy for the refill.) (Agent: If yes, when and what did the pharmacy advise?)  Preferred Pharmacy (with phone number or street name): [id number] - [place], [place]  Agent: Please be advised that RX refills may take up to 3 business days. We ask that you follow-up with your pharmacy.

## 2018-02-12 MED ORDER — ALBUTEROL SULFATE HFA 108 (90 BASE) MCG/ACT IN AERS: INHALATION_SPRAY | RESPIRATORY_TRACT | 1 refills | Status: DC

## 2018-02-15 ENCOUNTER — Other Ambulatory Visit: Payer: Self-pay | Admitting: Family Medicine

## 2018-02-15 NOTE — Telephone Encounter (Signed)
I'll do these rx's for 6 mo of RF's but tell her she is due for routine diabetes follow up.-thx

## 2018-03-04 ENCOUNTER — Telehealth: Payer: Self-pay | Admitting: Pulmonary Disease

## 2018-03-04 NOTE — Telephone Encounter (Signed)
Called and spoke with patient, she wanted to know who the company was the provide her with the CPAP machine. Per referal notes she gets this from [employer]. Advised patient of this, nothing further needed.

## 2018-03-11 ENCOUNTER — Ambulatory Visit: Payer: Managed Care, Other (non HMO) | Admitting: Pulmonary Disease

## 2018-03-19 ENCOUNTER — Other Ambulatory Visit: Payer: Self-pay | Admitting: Family Medicine

## 2018-04-15 ENCOUNTER — Ambulatory Visit: Payer: Managed Care, Other (non HMO) | Admitting: Pulmonary Disease

## 2018-04-22 ENCOUNTER — Telehealth: Payer: Self-pay | Admitting: Family Medicine

## 2018-04-22 ENCOUNTER — Other Ambulatory Visit: Payer: Self-pay | Admitting: Family Medicine

## 2018-04-22 ENCOUNTER — Other Ambulatory Visit: Payer: Self-pay

## 2018-04-22 ENCOUNTER — Encounter: Payer: Self-pay | Admitting: Physician Assistant

## 2018-04-22 ENCOUNTER — Ambulatory Visit (INDEPENDENT_AMBULATORY_CARE_PROVIDER_SITE_OTHER): Payer: Managed Care, Other (non HMO) | Admitting: Physician Assistant

## 2018-04-22 ENCOUNTER — Ambulatory Visit: Payer: Self-pay | Admitting: *Deleted

## 2018-04-22 VITALS — BP 102/70 | HR 104 | Temp 99.4°F | Resp 16 | Ht 65.0 in | Wt 268.0 lb

## 2018-04-22 DIAGNOSIS — R6889 Other general symptoms and signs: Secondary | ICD-10-CM

## 2018-04-22 DIAGNOSIS — J4521 Mild intermittent asthma with (acute) exacerbation: Secondary | ICD-10-CM | POA: Diagnosis not present

## 2018-04-22 DIAGNOSIS — K219 Gastro-esophageal reflux disease without esophagitis: Secondary | ICD-10-CM

## 2018-04-22 LAB — POC INFLUENZA A&B (BINAX/QUICKVUE)
Influenza A, POC: NEGATIVE
Influenza B, POC: NEGATIVE

## 2018-04-22 MED ORDER — IPRATROPIUM-ALBUTEROL 0.5-2.5 (3) MG/3ML IN SOLN
3.0000 mL | Freq: Once | RESPIRATORY_TRACT | Status: AC
  Administered 2018-04-22: 3 mL via RESPIRATORY_TRACT

## 2018-04-22 MED ORDER — OSELTAMIVIR PHOSPHATE 75 MG PO CAPS: 75.0000 mg | ORAL_CAPSULE | Freq: Two times a day (BID) | ORAL | 0 refills | Status: DC

## 2018-04-22 MED ORDER — BECLOMETHASONE DIPROP HFA 40 MCG/ACT IN AERB: 2.0000 | INHALATION_SPRAY | Freq: Two times a day (BID) | RESPIRATORY_TRACT | 0 refills | Status: DC

## 2018-04-22 MED ORDER — BENZONATATE 100 MG PO CAPS: 100.0000 mg | ORAL_CAPSULE | Freq: Three times a day (TID) | ORAL | 0 refills | Status: DC | PRN

## 2018-04-22 NOTE — Telephone Encounter (Signed)
Copied from [place] [id number]. Topic: General - Other >> Apr 22, 2018 12:39 PM [name] wrote: Reason for CRM:pt is calling back and would like flu medication if md agrees please send to [facility] on [address]. Theres nurse triage message. I can not addend

## 2018-04-22 NOTE — Progress Notes (Signed)
Patient presents to clinic today c/o < 24 hours of nonproductive cough, SOB and chest tightness (using inhaler about every 4 hours for relief)  Fever this morning of 101.6 and took ibuprofen for it. Chills, sinus congestion, clear nasal discharge, Pt endorses staying hydrated and resting as able  Denies nausea, vomiting, diarrhea, body aches, headache, ear fullness or pressure. Husband diagnosed with influenza Saturday. Is currently on treatment.   Patient also endorses daily heartburn and indigestion, not alleviated with TUMS. Denies epigastric pain, nausea or vomiting.   Past Medical History:  Diagnosis Date  . ADD (attention deficit disorder with hyperactivity)   . Allergic rhinitis   . Atypical chest pain    Myocardial perf imaging 04/04/16: Low risk stress nuclear study with prior inferior infarct and mild peri-infarct ischemia; EF 55 with mild hypokinesis of the inferior wall.  Cath normal, so stress test was FALSE POS.  . Bowel obstruction ([place]) 06-25-2010   post mastectomy  . Breast cancer ([place]) 2012   left mastectomy and chemotherapy  . Cholelithiasis 03/2016   u/s: no signs of cholecystitis  . Depression   . GERD (gastroesophageal reflux disease)   . Hyperlipidemia   . Hypertension   . Mild persistent asthma    dx'd age 10 (Dr. Gwenette Greet) hx of noncompliance with controller therapy due to cost  . Morbid obesity ([place])   . NASH (nonalcoholic steatohepatitis) 02/2010   u/s confirmed 02/2010 (hx of transaminitis).  Repeat u/s 03/2016 showed no sign of cirrhosis or liver mass, just moderate hepatic steatosis.  . Neuropathy   . OSA (obstructive sleep apnea)    Dr Gwenette Greet initially: pt couldn't tolerate CPAP.  Re-eval 12/2017 re-confirmed severe OSA, CPAP at 11 recommended.  . Ovarian mass    resected 07/12/10  . RLS (restless legs syndrome)   . Type II or unspecified type diabetes mellitus without mention of complication, uncontrolled 05/15/2012    Current Outpatient Medications on  File Prior to Visit  Medication Sig Dispense Refill  . albuterol (PROVENTIL HFA;VENTOLIN HFA) 108 (90 Base) MCG/ACT inhaler INHALE 1 PUFF BY MOUTH EVERY 6 HOURS AS NEEDED FOR WHEEZING/SHORTNESS OF BREATH 8.5 each 1  . atorvastatin (LIPITOR) 40 MG tablet TAKE ONE TABLET BY MOUTH DAILY 90 tablet 1  . CALCIUM-VITAMIN D PO Take by mouth.    . cetirizine (ZYRTEC) 10 MG tablet Take 10 mg by mouth daily.      . citalopram (CELEXA) 40 MG tablet TAKE ONE TABLET BY MOUTH DAILY 90 tablet 1  . clonazePAM (KLONOPIN) 0.5 MG tablet TAKE ONE TO TWO TABLETS BY MOUTH EVERY NIGHT AT BEDTIME AS NEEDED FOR INSOMNIA 60 tablet 5  . dextroamphetamine (DEXEDRINE SPANSULE) 10 MG 24 hr capsule 2 caps po qAM 60 capsule 0  . gabapentin (NEURONTIN) 300 MG capsule TAKE 2 CAPSULES (600 MG TOTAL) BY MOUTH TWO TIMES A DAY 360 capsule 1  . ibuprofen (ADVIL,MOTRIN) 200 MG tablet Take 800 mg by mouth as needed for fever or moderate pain.     . Insulin Pen Needle (BD PEN NEEDLE NANO U/F) 32G X 4 MM MISC Use with Victoza as directed once daily 100 each 1  . liraglutide (VICTOZA) 18 MG/3ML SOPN DIAL AND INJECT SUBCUTANEOUSLY 0.6MG UNDER THE SKIN EVERY MORNING FOR 7 DAYS THEN INCREASE 1.2MG DAILY 6 pen 1  . magnesium oxide (MAG-OX) 400 MG tablet Take 400 mg by mouth daily.    . Multiple Vitamin (MULTIVITAMIN) tablet Take 1 tablet by mouth daily.    Marland Kitchen  rOPINIRole (REQUIP) 0.5 MG tablet TAKE ONE TABLET BY MOUTH DAILY 90 tablet 3  . triamterene-hydrochlorothiazide (MAXZIDE-25) 37.5-25 MG tablet TAKE 1/2 TABLET BY MOUTH DAILY 45 tablet 1  . verapamil (CALAN) 120 MG tablet TAKE ONE-HALF TABLET BY MOUTH TWICE A DAY 90 tablet 0   No current facility-administered medications on file prior to visit.     Allergies  Allergen Reactions  . Metoprolol Other (See Comments)    REACTION: " chest pain"  . Sulfonamide Derivatives     nausea    Family History  Adopted: Yes  Problem Relation Age of Onset  . Healthy Son        77  . Healthy  Daughter        33    Social History   Socioeconomic History  . Marital status: Married    Spouse name: Not on file  . Number of children: 2  . Years of education: Not on file  . Highest education level: Not on file  Occupational History  . Occupation: Curator: artist [employer]  Social Needs  . Financial resource strain: Not on file  . Food insecurity:    Worry: Not on file    Inability: Not on file  . Transportation needs:    Medical: Not on file    Non-medical: Not on file  Tobacco Use  . Smoking status: Current Every Day Smoker    Packs/day: 0.50    Years: 24.00    Pack years: 12.00  . Smokeless tobacco: Never Used  Substance and Sexual Activity  . Alcohol use: No  . Drug use: No  . Sexual activity: Not on file  Lifestyle  . Physical activity:    Days per week: Not on file    Minutes per session: Not on file  . Stress: Not on file  Relationships  . Social connections:    Talks on phone: Not on file    Gets together: Not on file    Attends religious service: Not on file    Active member of club or organization: Not on file    Attends meetings of clubs or organizations: Not on file    Relationship status: Not on file  Other Topics Concern  . Not on file  Social History Narrative   Not working as of 09/2017.  Previously worked as a Art therapist.   Lives with husband and two children.   2-3 caffeine drinks daily       Review of Systems - See HPI.  All other ROS are negative.  BP 102/70   Pulse (!) 104   Temp 99.4 F (37.4 C) (Oral)   Resp 16   Ht 5' 5"  (1.651 m)   Wt 268 lb (121.6 kg)   LMP 07/14/2010 Comment: ovaries removed 2012  SpO2 96%   BMI 44.60 kg/m   Physical Exam Constitutional:      Appearance: Normal appearance.  HENT:     Right Ear: Tympanic membrane, ear canal and external ear normal.     Left Ear: Tympanic membrane, ear canal and external ear normal.     Mouth/Throat:     Pharynx: Uvula midline.  Neck:      Musculoskeletal: Neck supple.  Cardiovascular:     Rate and Rhythm: Normal rate and regular rhythm.     Heart sounds: Normal heart sounds.  Pulmonary:     Effort: Pulmonary effort is normal.     Breath sounds: Examination of the right-upper  field reveals wheezing. Examination of the right-middle field reveals wheezing. Examination of the left-middle field reveals wheezing. Wheezing present.  Neurological:     Mental Status: She is alert.    Assessment/Plan: 1. Flu-like symptoms Flu swab negative but classic symptoms and known exposure (husband). Start Tamiflu. Rx Tessalon. Supportive measures and OTC medications reviewed. - POC Influenza A&B(BINAX/QUICKVUE) - benzonatate (TESSALON) 100 MG capsule; Take 1 capsule (100 mg total) by mouth 3 (three) times daily as needed for cough.  Dispense: 30 capsule; Refill: 0 - oseltamivir (TAMIFLU) 75 MG capsule; Take 1 capsule (75 mg total) by mouth 2 (two) times daily.  Dispense: 10 capsule; Refill: 0  2. Mild intermittent asthma with exacerbation Duoneb given in office with improvement. Start Qvar 2 puffs BID. Continue Albuterol. Tessalon per orders. Strict return precautions reviewed. - benzonatate (TESSALON) 100 MG capsule; Take 1 capsule (100 mg total) by mouth 3 (three) times daily as needed for cough.  Dispense: 30 capsule; Refill: 0 - beclomethasone (QVAR) 40 MCG/ACT inhaler; Inhale 2 puffs into the lungs 2 (two) times daily.  Dispense: 10.6 g; Refill: 0  3. Gastroesophageal reflux disease without esophagitis Long-standing history. Not alleviated with TUMS. No alarm signs or symptoms. GERD diet reviewed and handout given. Start OTC Prilosec daily. Follow-up with PCP 2 weeks for further management.    Leeanne Rio, PA-C

## 2018-04-22 NOTE — Telephone Encounter (Signed)
Contacted patient and scheduled her an appointment at [facility] w/ Elyn Aquas.  I advised patient that Dr [name] is no in the office but would most likely need to see her to prescribe medications.

## 2018-04-22 NOTE — Telephone Encounter (Signed)
Please advise. Thanks.  

## 2018-04-22 NOTE — Telephone Encounter (Signed)
Patient advised to be seen.  Appointment scheduled at [facility] w/ Elyn Aquas.

## 2018-04-22 NOTE — Patient Instructions (Addendum)
For Flu-like illness:  - Starting Tamiflu taking as directed.  - Keep hydrated and get plenty of rest.  - Run a humidifier in the bedroom.  - Start saline nasal rinses  - Theraflu otc to help with symptoms.  - No work until fever free for 24 hours.  For Wheezing:  - Please use Qvar as directed to help calm down mild asthma exacerbation  - Continue albuterol but do not use more than as directed.  For Heartburn:  - Please follow the dietary recommendations below  - Start an over-the-counter Prilosec once daily to help with these symptoms.  - Please schedule follow-up for 1-2 weeks with Dr. [name] regarding this.    Food Choices for Gastroesophageal Reflux Disease, Adult When you have gastroesophageal reflux disease (GERD), the foods you eat and your eating habits are very important. Choosing the right foods can help ease your discomfort. Think about working with a nutrition specialist (dietitian) to help you make good choices. What are tips for following this plan?  Meals  Choose healthy foods that are low in fat, such as fruits, vegetables, whole grains, low-fat dairy products, and lean meat, fish, and poultry.  Eat small meals often instead of 3 large meals a day. Eat your meals slowly, and in a place where you are relaxed. Avoid bending over or lying down until 2-3 hours after eating.  Avoid eating meals 2-3 hours before bed.  Avoid drinking a lot of liquid with meals.  Cook foods using methods other than frying. Bake, grill, or broil food instead.  Avoid or limit: ? Chocolate. ? Peppermint or spearmint. ? Alcohol. ? Pepper. ? Black and decaffeinated coffee. ? Black and decaffeinated tea. ? Bubbly (carbonated) soft drinks. ? Caffeinated energy drinks and soft drinks.  Limit high-fat foods such as: ? Fatty meat or fried foods. ? Whole milk, cream, butter, or ice cream. ? Nuts and nut butters. ? Pastries, donuts, and sweets made with butter or shortening.  Avoid  foods that cause symptoms. These foods may be different for everyone. Common foods that cause symptoms include: ? Tomatoes. ? Oranges, lemons, and limes. ? Peppers. ? Spicy food. ? Onions and garlic. ? Vinegar. Lifestyle  Maintain a healthy weight. Ask your doctor what weight is healthy for you. If you need to lose weight, work with your doctor to do so safely.  Exercise for at least 30 minutes for 5 or more days each week, or as told by your doctor.  Wear loose-fitting clothes.  Do not smoke. If you need help quitting, ask your doctor.  Sleep with the head of your bed higher than your feet. Use a wedge under the mattress or blocks under the bed frame to raise the head of the bed. Summary  When you have gastroesophageal reflux disease (GERD), food and lifestyle choices are very important in easing your symptoms.  Eat small meals often instead of 3 large meals a day. Eat your meals slowly, and in a place where you are relaxed.  Limit high-fat foods such as fatty meat or fried foods.  Avoid bending over or lying down until 2-3 hours after eating.  Avoid peppermint and spearmint, caffeine, alcohol, and chocolate. This information is not intended to replace advice given to you by your health care provider. Make sure you discuss any questions you have with your health care provider. Document Released: 08/29/2011 Document Revised: 04/04/2016 Document Reviewed: 04/04/2016 Elsevier Interactive Patient Education  2019 [employer].

## 2018-04-22 NOTE — Telephone Encounter (Signed)
Hus[facility] diagnosed [facility] days ago.   Her cough and fever began yesterday. Temp. 100.1 this am.  Non productive cough with nasal congestion. Has a history of asthma and has started using Proair inhaler every 4 hours today with feeling tight in her chest with the coughing.  Reports she has not smoked in 2 days. Reviewed advice care with patient.  Use pharmacy on [address]. Please note this is not her pharmacy on file.Marland Kitchen  Routing to provider for advice.  Reason for Disposition . Patient is HIGH RISK (e.g., age > 23 years, pregnant, HIV+, or chronic medical condition)  Answer Assessment - Initial Assessment Questions 1. TYPE of EXPOSURE: "How were you exposed?" (e.g., close contact, not a close contact)     Hus[facility]. 2. DATE of EXPOSURE: "When did the exposure occur?" (e.g., hour, days, weeks)    2 days ago. 3. PREGNANCY: "Is there any chance you are pregnant?" "When was your last menstrual period?"     no 4. HIGH RISK for COMPLICATIONS: "Do you have any heart or lung problems? Do you have a weakened immune system?" (e.g., CHF, COPD, asthma, HIV positive, chemotherapy, renal failure, diabetes mellitus, sickle cell anemia)     asthma 5. SYMPTOMS: "Do you have any symptoms?" (e.g., cough, fever, sore throat, difficulty breathing).     Fever Nasal congestion Cough  Answer Assessment - Initial Assessment Questions 1. WORST SYMPTOM: "What is your worst symptom?" (e.g., cough, runny nose, muscle aches, headache, sore throat, fever)      Cough nasal congestion fatigue 2. ONSET: "When did your flu symptoms start?"      yesterday 3. COUGH: "How bad is the cough?"       Cough during the day. 4. RESPIRATORY DISTRESS: "Describe your breathing."      With inhaling she coughs more. 5. FEVER: "Do you have a fever?" If so, ask: "What is your temperature, how was it measured, and when did it start?"     100.1  this am 6. EXPOSURE: "Were you exposed to someone with influenza?"       Yes her husband diagnosed on Saturday  7. FLU VACCINE: "Did you get a flu shot this year?"     yes 8. HIGH RISK DISEASE: "Do you any chronic medical problems?" (e.g., heart or lung disease, asthma, weak immune system, or other HIGH RISK conditions)     Asthma and DM 9. PREGNANCY: "Is there any chance you are pregnant?" "When was your last menstrual period?"     no 10. OTHER SYMPTOMS: "Do you have any other symptoms?"  (e.g., runny nose, muscle aches, headache, sore throat)  Protocols used: INFLUENZA - SEASONAL-A-AH, INFLUENZA EXPOSURE-A-AH

## 2018-05-14 ENCOUNTER — Other Ambulatory Visit: Payer: Self-pay | Admitting: Family Medicine

## 2018-05-14 NOTE — Telephone Encounter (Signed)
RF request for Celexa LOV: 10/11/17 Next ov: none Last written: 11/13/17 #90 w/ 1RF  Lat OV, you said follow up was to be determined based on results of pending rx trials and referral workup.  Please advise, thanks.

## 2018-05-21 ENCOUNTER — Other Ambulatory Visit: Payer: Self-pay | Admitting: Family Medicine

## 2018-05-27 ENCOUNTER — Ambulatory Visit: Payer: Managed Care, Other (non HMO) | Admitting: Family Medicine

## 2018-05-27 ENCOUNTER — Other Ambulatory Visit: Payer: Self-pay

## 2018-05-27 ENCOUNTER — Encounter: Payer: Self-pay | Admitting: Family Medicine

## 2018-05-27 VITALS — BP 120/73 | HR 79 | Temp 97.7°F | Resp 16 | Ht 65.0 in | Wt 264.6 lb

## 2018-05-27 DIAGNOSIS — E7800 Pure hypercholesterolemia, unspecified: Secondary | ICD-10-CM | POA: Diagnosis not present

## 2018-05-27 DIAGNOSIS — E119 Type 2 diabetes mellitus without complications: Secondary | ICD-10-CM

## 2018-05-27 DIAGNOSIS — F909 Attention-deficit hyperactivity disorder, unspecified type: Secondary | ICD-10-CM

## 2018-05-27 DIAGNOSIS — K7581 Nonalcoholic steatohepatitis (NASH): Secondary | ICD-10-CM

## 2018-05-27 DIAGNOSIS — I10 Essential (primary) hypertension: Secondary | ICD-10-CM

## 2018-05-27 MED ORDER — DEXTROAMPHETAMINE SULFATE ER 10 MG PO CP24: ORAL_CAPSULE | ORAL | 0 refills | Status: DC

## 2018-05-27 NOTE — Progress Notes (Signed)
OFFICE VISIT  05/27/2018   CC:  Chief Complaint  Patient presents with  . Follow-up    RCI, pt is not fasting.   HPI:    Patient is a 53 y.o. Caucasian female who presents for f/u 7 mo f/u DM 2, HTN, HLD, adult ADHD, and NASH. Feeling well lately.  She looks great and is happy with the progress she is making in taking care of herself better.  DM: was remodeling, lost glucometer/supplies so she had to get new stuff, just started taking glucoses again last week.  Ranging 140-low 300s during this time. She has stopped sweet drinks.  Eating sugar-free snacks. Minimal effort at exercise.    HTN: rare home bp checks, but always <120/80.  No dizziness.  HLD: tolerating statin well.  NASH: making some dietary changes in effort to lose wt, but no exercise.  Adult ADHD:  Pt states all is going well with the med at current dosing: much improved focus, concentration, task completion.  Less frustration, better multitasking, less impulsivity and restlessness.  Mood is stable. No side effects from the medication.  ROS: no CP, no SOB, no wheezing, no cough, no dizziness, no HAs, no rashes, no melena/hematochezia.  No polyuria or polydipsia.  No joint swelling.  Past Medical History:  Diagnosis Date  . ADD (attention deficit disorder with hyperactivity)   . Allergic rhinitis   . Atypical chest pain    Myocardial perf imaging 04/04/16: Low risk stress nuclear study with prior inferior infarct and mild peri-infarct ischemia; EF 55 with mild hypokinesis of the inferior wall.  Cath normal, so stress test was FALSE POS.  . Bowel obstruction ([place]) 06-25-2010   post mastectomy  . Breast cancer ([place]) 2012   left mastectomy and chemotherapy  . Cholelithiasis 03/2016   u/s: no signs of cholecystitis  . Depression   . GERD (gastroesophageal reflux disease)   . Hyperlipidemia   . Hypertension   . Mild persistent asthma    dx'd age 51 (Dr. Gwenette Greet) hx of noncompliance with controller therapy due to cost   . Morbid obesity ([place])   . NASH (nonalcoholic steatohepatitis) 02/2010   u/s confirmed 02/2010 (hx of transaminitis).  Repeat u/s 03/2016 showed no sign of cirrhosis or liver mass, just moderate hepatic steatosis.  . Neuropathy   . OSA (obstructive sleep apnea)    Dr Gwenette Greet initially: pt couldn't tolerate CPAP.  Re-eval 12/2017 re-confirmed severe OSA, CPAP at 11 recommended.  . Ovarian mass    resected 07/12/10  . RLS (restless legs syndrome)   . Type II or unspecified type diabetes mellitus without mention of complication, uncontrolled 05/15/2012    Past Surgical History:  Procedure Laterality Date  . BILATERAL SALPINGOOPHORECTOMY  07/12/2010   enlarged ovary  . CARDIOVASCULAR STRESS TEST  04/04/2016   Low risk stress nuclear study with prior inferior infarct and mild peri-infarct ischemia; EF 55% with mild hypokinesis of the inferior wall.  F/u cath was NORMAL, so this is suspected to be a false pos stress test (diaphragmatic attenuation)  . G 2 P 2    . LEFT HEART CATH AND CORONARY ANGIOGRAPHY N/A 04/20/2016   Normal coronaries.  EF 55-65%, normal LV function. (Stress test prior to this was false pos due to diaphragmatic attenuation). Procedure: Left Heart Cath and Coronary Angiography;  Surgeon: Peter M Martinique, MD;  Location: [facility];  Service: Cardiovascular;  Laterality: N/A;  . MASTECTOMY  06/21/2010   Left ; [facility]  Outpatient Medications Prior to Visit  Medication Sig Dispense Refill  . albuterol (PROVENTIL HFA;VENTOLIN HFA) 108 (90 Base) MCG/ACT inhaler INHALE 1 PUFF BY MOUTH EVERY 6 HOURS AS NEEDED FOR WHEEZING/SHORTNESS OF BREATH 8.5 each 1  . atorvastatin (LIPITOR) 40 MG tablet TAKE ONE TABLET BY MOUTH DAILY 90 tablet 1  . CALCIUM-VITAMIN D PO Take by mouth.    . cetirizine (ZYRTEC) 10 MG tablet Take 10 mg by mouth daily.      . citalopram (CELEXA) 40 MG tablet TAKE ONE TABLET BY MOUTH DAILY 90 tablet 1  . clonazePAM (KLONOPIN) 0.5 MG tablet TAKE  ONE TO TWO TABLETS BY MOUTH EVERY NIGHT AT BEDTIME AS NEEDED FOR INSOMNIA 60 tablet 5  . gabapentin (NEURONTIN) 300 MG capsule TAKE 2 CAPSULES (600 MG TOTAL) BY MOUTH TWO TIMES A DAY 360 capsule 1  . ibuprofen (ADVIL,MOTRIN) 200 MG tablet Take 800 mg by mouth as needed for fever or moderate pain.     . Insulin Pen Needle (BD PEN NEEDLE NANO U/F) 32G X 4 MM MISC Use with Victoza as directed once daily 100 each 1  . liraglutide (VICTOZA) 18 MG/3ML SOPN INJECT 1.2 MG SUBCUTANEOUSLY DAILY 9 mL 1  . magnesium oxide (MAG-OX) 400 MG tablet Take 400 mg by mouth daily.    . Multiple Vitamin (MULTIVITAMIN) tablet Take 1 tablet by mouth daily.    Marland Kitchen rOPINIRole (REQUIP) 0.5 MG tablet TAKE ONE TABLET BY MOUTH DAILY 90 tablet 3  . triamterene-hydrochlorothiazide (MAXZIDE-25) 37.5-25 MG tablet TAKE 1/2 TABLET BY MOUTH DAILY 45 tablet 1  . verapamil (CALAN) 120 MG tablet TAKE ONE-HALF TABLET BY MOUTH TWICE A DAY 90 tablet 0  . beclomethasone (QVAR) 40 MCG/ACT inhaler Inhale 2 puffs into the lungs 2 (two) times daily. 10.6 g 0  . benzonatate (TESSALON) 100 MG capsule Take 1 capsule (100 mg total) by mouth 3 (three) times daily as needed for cough. 30 capsule 0  . dextroamphetamine (DEXEDRINE SPANSULE) 10 MG 24 hr capsule 2 caps po qAM 60 capsule 0  . oseltamivir (TAMIFLU) 75 MG capsule Take 1 capsule (75 mg total) by mouth 2 (two) times daily. 10 capsule 0   No facility-administered medications prior to visit.     Allergies  Allergen Reactions  . Metoprolol Other (See Comments)    REACTION: " chest pain"  . Sulfonamide Derivatives     nausea    ROS As per HPI  PE: Blood pressure 120/73, pulse 79, temperature 97.7 F (36.5 C), temperature source Oral, resp. rate 16, height 5' 5"  (1.651 m), weight 264 lb 9.6 oz (120 kg), last menstrual period 07/14/2010, SpO2 96 %. Body mass index is 44.03 kg/m.  Gen: Alert, well appearing.  Patient is oriented to person, place, time, and situation. AFFECT: pleasant,  lucid thought and speech. CV: RRR, no m/r/g.   LUNGS: CTA bilat, nonlabored resps, good aeration in all lung fields. EXT: no clubbing or cyanosis.  no edema.    LABS:  Lab Results  Component Value Date   TSH 1.72 10/03/2017   Lab Results  Component Value Date   WBC 7.6 10/03/2017   HGB 15.6 (H) 10/03/2017   HCT 45.2 10/03/2017   MCV 92.3 10/03/2017   PLT 303.0 10/03/2017   Lab Results  Component Value Date   CREATININE 0.64 10/03/2017   BUN 12 10/03/2017   NA 138 10/03/2017   K 4.0 10/03/2017   CL 102 10/03/2017   CO2 27 10/03/2017   Lab Results  Component Value Date   ALT 99 (H) 10/03/2017   AST 72 (H) 10/03/2017   ALKPHOS 48 10/03/2017   BILITOT 0.6 10/03/2017   Lab Results  Component Value Date   CHOL 124 10/03/2017   Lab Results  Component Value Date   HDL 30.00 (L) 10/03/2017   Lab Results  Component Value Date   LDLCALC 44 04/12/2016   Lab Results  Component Value Date   TRIG 229.0 (H) 10/03/2017   Lab Results  Component Value Date   CHOLHDL 4 10/03/2017   Lab Results  Component Value Date   HGBA1C 7.6 (H) 10/03/2017     IMPRESSION AND PLAN:  1) DM 2, control historically not ideal. Currently taking only victoza 1.2 mg qd.  HbA1c today.  Lytes/cr today.  2) HTN: The current medical regimen is effective;  continue present plan and medications. Lytes/cr today.  3) HLD: tolerating statin.  NOt fasting today.  Next f/u we'll hopefully be able to get her to come in fasting so we can update her lipid panel and adjust med as necessary.  4) Adult ADHD: The current medical regimen is effective;  continue present plan and medications. CSC UTD.  UDS UTD. I did electronic rx's for dexedrine spansule 24h, 41m tabs, 2 tabs po qAM, #60 today for this month, April, and May 2020.  Appropriate fill on/after date was noted on each rx.  5) NASH: making dietary changes appropriately but needs to start exercising. Recheck hepatic panel today.  An After  Visit Summary was printed and given to the patient.  FOLLOW UP: Return in about 3 months (around 08/27/2018) for routine chronic illness f/u.  Signed:  PCrissie Sickles MD           05/27/2018

## 2018-05-28 ENCOUNTER — Telehealth: Payer: Self-pay | Admitting: *Deleted

## 2018-05-28 LAB — COMPREHENSIVE METABOLIC PANEL
ALT: 96 U/L — AB (ref 0–35)
AST: 46 U/L — ABNORMAL HIGH (ref 0–37)
Albumin: 4.2 g/dL (ref 3.5–5.2)
Alkaline Phosphatase: 42 U/L (ref 39–117)
BILIRUBIN TOTAL: 0.6 mg/dL (ref 0.2–1.2)
BUN: 14 mg/dL (ref 6–23)
CO2: 31 mEq/L (ref 19–32)
Calcium: 9.5 mg/dL (ref 8.4–10.5)
Chloride: 104 mEq/L (ref 96–112)
Creatinine, Ser: 0.64 mg/dL (ref 0.40–1.20)
GFR: 97.51 mL/min (ref >60.00)
Glucose, Bld: 241 mg/dL — ABNORMAL HIGH (ref 70–99)
Potassium: 4.6 mEq/L (ref 3.5–5.1)
Sodium: 141 mEq/L (ref 135–145)
Total Protein: 7.1 g/dL (ref 6.0–8.3)

## 2018-05-28 LAB — HEMOGLOBIN A1C: Hgb A1c MFr Bld: 7.5 % — ABNORMAL HIGH (ref 4.6–6.5)

## 2018-05-28 NOTE — Telephone Encounter (Signed)
PA was approved.  CaseId:[id number];Status:Approved;Review Type:Qty;Coverage Start Date:05/28/2018;Coverage End Date:05/28/2019;

## 2018-05-28 NOTE — Telephone Encounter (Signed)
PA sent via covermymed on 05/28/18   Key: [id number]   Medication: dextroamphetamine   Dx: F90.0   Per Dr. [name] pt has tried and failed: Unknown    Waiting for response.

## 2018-05-29 ENCOUNTER — Encounter: Payer: Self-pay | Admitting: Family Medicine

## 2018-05-30 ENCOUNTER — Encounter: Payer: Self-pay | Admitting: *Deleted

## 2018-05-30 ENCOUNTER — Other Ambulatory Visit: Payer: Self-pay | Admitting: Physician Assistant

## 2018-05-30 DIAGNOSIS — J4521 Mild intermittent asthma with (acute) exacerbation: Secondary | ICD-10-CM

## 2018-06-03 ENCOUNTER — Ambulatory Visit: Payer: Self-pay | Admitting: *Deleted

## 2018-06-03 NOTE — Telephone Encounter (Signed)
Medication question regarding her dextroamphetamine refill. Answered questions. She will check with her local pharmacy.  Reason for Disposition . Caller has medication question only, adult not sick, and triager answers question  Answer Assessment - Initial Assessment Questions 1. SYMPTOMS: "Do you have any symptoms?"     no 2. SEVERITY: If symptoms are present, ask "Are they mild, moderate or severe?"     Na  Protocols used: MEDICATION QUESTION CALL-A-AH

## 2018-06-22 ENCOUNTER — Other Ambulatory Visit: Payer: Self-pay | Admitting: Family Medicine

## 2018-08-16 ENCOUNTER — Other Ambulatory Visit: Payer: Self-pay | Admitting: Family Medicine

## 2018-08-16 NOTE — Telephone Encounter (Signed)
RF request for Clonazepam LOV: 05/27/18 Next ov: advised to f/u 3 months Last written: 12/31/17(60,5) No CSC for this med but UDS:10/03/17  PMP aware printed and given to provider for review of refill. Please advise, thanks. Medication pending.

## 2018-08-16 NOTE — Telephone Encounter (Signed)
I'll do 1 mo supply. I need to see her in 3-4 wks for f/u anxiety and adult ADD. We need to get clonazepam on her controlled substance contract at that visit and repeat UDS.-thx

## 2018-08-19 NOTE — Telephone Encounter (Signed)
Patient advised, Appt scheduled 09/06/2018 @ 10:30am.

## 2018-09-02 ENCOUNTER — Other Ambulatory Visit: Payer: Self-pay | Admitting: Family Medicine

## 2018-09-03 NOTE — Telephone Encounter (Signed)
SW pt and she has enough to last until appt on 6/26.

## 2018-09-06 ENCOUNTER — Ambulatory Visit: Payer: Managed Care, Other (non HMO) | Admitting: Family Medicine

## 2018-09-06 ENCOUNTER — Encounter: Payer: Self-pay | Admitting: Family Medicine

## 2018-09-06 ENCOUNTER — Other Ambulatory Visit: Payer: Self-pay

## 2018-09-06 VITALS — BP 130/80 | HR 83 | Temp 98.1°F | Resp 16 | Ht 65.0 in | Wt 259.0 lb

## 2018-09-06 DIAGNOSIS — Z79899 Other long term (current) drug therapy: Secondary | ICD-10-CM

## 2018-09-06 DIAGNOSIS — F341 Dysthymic disorder: Secondary | ICD-10-CM

## 2018-09-06 DIAGNOSIS — E119 Type 2 diabetes mellitus without complications: Secondary | ICD-10-CM | POA: Diagnosis not present

## 2018-09-06 DIAGNOSIS — E7800 Pure hypercholesterolemia, unspecified: Secondary | ICD-10-CM | POA: Diagnosis not present

## 2018-09-06 DIAGNOSIS — F411 Generalized anxiety disorder: Secondary | ICD-10-CM

## 2018-09-06 DIAGNOSIS — F988 Other specified behavioral and emotional disorders with onset usually occurring in childhood and adolescence: Secondary | ICD-10-CM

## 2018-09-06 LAB — POCT GLYCOSYLATED HEMOGLOBIN (HGB A1C)
HbA1c POC (<> result, manual entry): 6.7 % (ref 4.0–5.6)
HbA1c, POC (controlled diabetic range): 6.7 % (ref 0.0–7.0)
HbA1c, POC (prediabetic range): 6.7 % — AB (ref 5.7–6.4)
Hemoglobin A1C: 6.7 % — AB (ref 4.0–5.6)

## 2018-09-06 LAB — MICROALBUMIN / CREATININE URINE RATIO
Creatinine,U: 183 mg/dL
Microalb Creat Ratio: 0.7 mg/g (ref 0.0–30.0)
Microalb, Ur: 1.2 mg/dL (ref 0.0–1.9)

## 2018-09-06 MED ORDER — CLONAZEPAM 0.5 MG PO TABS: ORAL_TABLET | ORAL | 5 refills | Status: DC

## 2018-09-06 MED ORDER — DEXTROAMPHETAMINE SULFATE ER 10 MG PO CP24: ORAL_CAPSULE | ORAL | 0 refills | Status: DC

## 2018-09-06 NOTE — Progress Notes (Signed)
OFFICE VISIT  10/04/2018   CC:  Chief Complaint  Patient presents with  . Follow-up    RCI, pt is fasting     HPI:    Patient is a 53 y.o. Caucasian female who presents for f/u anxiety, adult ADD, DM 2.  DM: fasting range 100-115.  Rare PP check about the same. No burning or tingling in feet.  NO SENSATION in feet at all x a couple years. No hx of ulcer.  Has broken 2 toes w/out pain! Gabapentin has helped a lot with DPN burning and tingling.  GAD/insomnia: doing fine.  Takes citalopram daily, clonaz nightly but occ in daytime for "panic".  ADD: Pt states all is going well with the med at current dosing: much improved focus, concentration, task completion.  Less frustration, better multitasking, less impulsivity and restlessness.  Mood is stable. No side effects from the medication.  She quit smoking 3 wks ago!  Using nicotine gum.    ROS: no CP, no SOB, no wheezing, no cough, no dizziness, no HAs, no rashes, no melena/hematochezia.  No polyuria or polydipsia.     Past Medical History:  Diagnosis Date  . ADD (attention deficit disorder with hyperactivity)   . Allergic rhinitis   . Atypical chest pain    Myocardial perf imaging 04/04/16: Low risk stress nuclear study with prior inferior infarct and mild peri-infarct ischemia; EF 55 with mild hypokinesis of the inferior wall.  Cath normal, so stress test was FALSE POS.  . Bowel obstruction ([place]) 06-25-2010   post mastectomy  . Breast cancer ([place]) 2012   left mastectomy and chemotherapy  . Cholelithiasis 03/2016   u/s: no signs of cholecystitis  . Depression   . GERD (gastroesophageal reflux disease)   . Hyperlipidemia   . Hypertension   . Mild persistent asthma    dx'd age 41 (Dr. Gwenette Greet) hx of noncompliance with controller therapy due to cost  . Morbid obesity ([place])   . NASH (nonalcoholic steatohepatitis) 02/2010   u/s confirmed 02/2010 (hx of transaminitis).  Repeat u/s 03/2016 showed no sign of cirrhosis or liver  mass, just moderate hepatic steatosis. AST/ALT stable 05/2018.  Marland Kitchen Neuropathy   . OSA (obstructive sleep apnea)    Dr Gwenette Greet initially: pt couldn't tolerate CPAP.  Re-eval 12/2017 re-confirmed severe OSA, CPAP at 11 recommended.  . Ovarian mass    resected 07/12/10  . RLS (restless legs syndrome)   . Type II or unspecified type diabetes mellitus without mention of complication, uncontrolled 05/15/2012    Past Surgical History:  Procedure Laterality Date  . BILATERAL SALPINGOOPHORECTOMY  07/12/2010   enlarged ovary  . CARDIOVASCULAR STRESS TEST  04/04/2016   Low risk stress nuclear study with prior inferior infarct and mild peri-infarct ischemia; EF 55% with mild hypokinesis of the inferior wall.  F/u cath was NORMAL, so this is suspected to be a false pos stress test (diaphragmatic attenuation)  . G 2 P 2    . LEFT HEART CATH AND CORONARY ANGIOGRAPHY N/A 04/20/2016   Normal coronaries.  EF 55-65%, normal LV function. (Stress test prior to this was false pos due to diaphragmatic attenuation). Procedure: Left Heart Cath and Coronary Angiography;  Surgeon: Peter M Martinique, MD;  Location: [place] [facility];  Service: Cardiovascular;  Laterality: N/A;  . MASTECTOMY  06/21/2010   Left ; Dr [facility]    Outpatient Medications Prior to Visit  Medication Sig Dispense Refill  . albuterol (PROVENTIL HFA;VENTOLIN HFA) 108 (90 Base) MCG/ACT  inhaler INHALE 1 PUFF BY MOUTH EVERY 6 HOURS AS NEEDED FOR WHEEZING/SHORTNESS OF BREATH 8.5 each 1  . atorvastatin (LIPITOR) 40 MG tablet TAKE ONE TABLET BY MOUTH DAILY 90 tablet 1  . CALCIUM-VITAMIN D PO Take by mouth.    . cetirizine (ZYRTEC) 10 MG tablet Take 10 mg by mouth daily.      . citalopram (CELEXA) 40 MG tablet TAKE ONE TABLET BY MOUTH DAILY 90 tablet 1  . gabapentin (NEURONTIN) 300 MG capsule TAKE 2 CAPSULES (600 MG TOTAL) BY MOUTH TWO TIMES A DAY 360 capsule 1  . ibuprofen (ADVIL,MOTRIN) 200 MG tablet Take 800 mg by mouth as needed for fever or  moderate pain.     . Insulin Pen Needle (BD PEN NEEDLE NANO U/F) 32G X 4 MM MISC Use with Victoza as directed once daily 100 each 1  . liraglutide (VICTOZA) 18 MG/3ML SOPN INJECT 1.2 MG SUBCUTANEOUSLY DAILY 9 mL 1  . magnesium oxide (MAG-OX) 400 MG tablet Take 400 mg by mouth daily.    . Multiple Vitamin (MULTIVITAMIN) tablet Take 1 tablet by mouth daily.    Marland Kitchen triamterene-hydrochlorothiazide (MAXZIDE-25) 37.5-25 MG tablet TAKE 1/2 TABLET BY MOUTH DAILY 45 tablet 1  . verapamil (CALAN) 120 MG tablet TAKE ONE-HALF TABLET BY MOUTH TWO TIMES A DAY 90 tablet 0  . clonazePAM (KLONOPIN) 0.5 MG tablet TAKE 1-2 TABLETS BY MOUTH EVERY EVENING AT BEDTIME AS NEEDED FOR INSOMNIA 60 tablet 0  . dextroamphetamine (DEXEDRINE SPANSULE) 10 MG 24 hr capsule 2 caps po qAM 60 capsule 0  . rOPINIRole (REQUIP) 0.5 MG tablet TAKE ONE TABLET BY MOUTH DAILY 90 tablet 3   No facility-administered medications prior to visit.     Allergies  Allergen Reactions  . Metoprolol Other (See Comments)    REACTION: " chest pain"  . Sulfonamide Derivatives     nausea    ROS As per HPI  PE: Blood pressure 130/80, pulse 83, temperature 98.1 F (36.7 C), temperature source Temporal, resp. rate 16, height 5' 5"  (1.651 m), weight 259 lb (117.5 kg), last menstrual period 07/14/2010, SpO2 94 %. Wt Readings from Last 2 Encounters:  09/06/18 259 lb (117.5 kg)  05/27/18 264 lb 9.6 oz (120 kg)    Gen: alert, oriented x 4, affect pleasant.  Lucid thinking and conversation noted. HEENT: PERRLA, EOMI.   Neck: no LAD, mass, or thyromegaly. CV: RRR, no m/r/g LUNGS: CTA bilat, nonlabored. NEURO: no tremor or tics noted on observation.  Coordination intact. CN 2-12 grossly intact bilaterally, strength 5/5 in all extremeties.  No ataxia. Foot exam - no swelling, tenderness or skin or vascular lesions. Color and temperature is normal. Sensation is ABSENT to monofilament testing of entire plantar surface of both feet.  Peripheral  pulses are palpable. Toenails are normal.   LABS:  Lab Results  Component Value Date   HGBA1C 6.7 (A) 09/06/2018   HGBA1C 6.7 09/06/2018   HGBA1C 6.7 (A) 09/06/2018   HGBA1C 6.7 09/06/2018     Chemistry      Component Value Date/Time   NA 141 05/27/2018 1637   NA 141 01/15/2015 0911   K 4.6 05/27/2018 1637   K 3.6 01/15/2015 0911   CL 104 05/27/2018 1637   CO2 31 05/27/2018 1637   CO2 27 01/15/2015 0911   BUN 14 05/27/2018 1637   BUN 17.2 01/15/2015 0911   CREATININE 0.64 05/27/2018 1637   CREATININE 0.67 04/12/2016 1311   CREATININE 0.8 01/15/2015 0911  Component Value Date/Time   CALCIUM 9.5 05/27/2018 1637   CALCIUM 10.7 (H) 01/15/2015 0911   ALKPHOS 42 05/27/2018 1637   ALKPHOS 42 01/15/2015 0911   AST 46 (H) 05/27/2018 1637   AST 64 (H) 01/15/2015 0911   ALT 96 (H) 05/27/2018 1637   ALT 153 (H) 01/15/2015 0911   BILITOT 0.6 05/27/2018 1637   BILITOT 0.57 01/15/2015 0911     Lab Results  Component Value Date   TSH 1.72 10/03/2017   Lab Results  Component Value Date   CHOL 124 10/03/2017   HDL 30.00 (L) 10/03/2017   LDLCALC 44 04/12/2016   LDLDIRECT 70.0 10/03/2017   TRIG 229.0 (H) 10/03/2017   CHOLHDL 4 10/03/2017   POC HbA1c today was 6.7%  IMPRESSION AND PLAN:  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 POC A1c->6.7%.  No med changes. Signif DPN: discussed shoes, check feet regularly, continue gabapentin. Urine microalbumin/cr today.  2) GAD: doing well on citalopram long term and prn use of clonazepam. Renewed CSC. UDS today: most recent clonaz and dexedrine were yesterday.  3) Adult ADD: The current medical regimen is effective;  continue present plan and medications. I did electronic rx's for dexedrine spansule 24h 37m, 2 tabs qAM, #60 today for July, Aug, and Sept 2020.  Appropriate fill on/after date was noted on each rx.  Doing great!  An After Visit Summary was printed and given to the patient.  FOLLOW UP: Return in about 3 months (around 12/07/2018)  for annual CPE (fasting).   Signed:  PCrissie Sickles MD           10/04/2018

## 2018-09-08 LAB — PAIN MGMT, PROFILE 8 W/CONF, U
6 Acetylmorphine: NEGATIVE ng/mL
Alcohol Metabolites: NEGATIVE ng/mL (ref <500)
Alphahydroxyalprazolam: NEGATIVE ng/mL
Alphahydroxymidazolam: NEGATIVE ng/mL
Alphahydroxytriazolam: NEGATIVE ng/mL
Aminoclonazepam: 271 ng/mL
Amphetamine: 11963 ng/mL
Amphetamines: POSITIVE ng/mL
Benzodiazepines: POSITIVE ng/mL
Buprenorphine, Urine: NEGATIVE ng/mL
Cocaine Metabolite: NEGATIVE ng/mL
Creatinine: 291.4 mg/dL
Hydroxyethylflurazepam: NEGATIVE ng/mL
Lorazepam: NEGATIVE ng/mL
MDMA: NEGATIVE ng/mL
Marijuana Metabolite: NEGATIVE ng/mL
Methamphetamine: NEGATIVE ng/mL
Nordiazepam: NEGATIVE ng/mL
Opiates: NEGATIVE ng/mL
Oxazepam: NEGATIVE ng/mL
Oxidant: NEGATIVE ug/mL
Oxycodone: NEGATIVE ng/mL
Temazepam: NEGATIVE ng/mL
pH: 5.5 (ref 4.5–9.0)

## 2018-10-11 ENCOUNTER — Other Ambulatory Visit: Payer: Self-pay | Admitting: Family Medicine

## 2018-10-11 ENCOUNTER — Telehealth: Payer: Self-pay | Admitting: Family Medicine

## 2018-10-11 MED ORDER — LIRAGLUTIDE 18 MG/3ML ~~LOC~~ SOPN: PEN_INJECTOR | SUBCUTANEOUS | 0 refills | Status: DC

## 2018-10-11 MED ORDER — VERAPAMIL HCL 120 MG PO TABS: ORAL_TABLET | ORAL | 0 refills | Status: DC

## 2018-10-11 NOTE — Telephone Encounter (Signed)
Refills were sent in. Pt was notified.

## 2018-10-11 NOTE — Telephone Encounter (Signed)
Needs refill asap --- she is going out of town We have not received from pharmacy  verapamil (CALAN) 120 MG tablet [[id number]  liraglutide ([place]) 18 MG/3ML SOPN [[id number]]  Pharmacy:  [facility] [address], [place]  She was sent to [facility]. She was there at pharmacy waiting for pick up of prescriptions.    Thank you

## 2018-10-21 ENCOUNTER — Other Ambulatory Visit: Payer: Self-pay | Admitting: Family Medicine

## 2018-10-21 ENCOUNTER — Encounter: Payer: Self-pay | Admitting: Family Medicine

## 2018-10-24 ENCOUNTER — Other Ambulatory Visit: Payer: Self-pay | Admitting: Family Medicine

## 2018-10-25 NOTE — Telephone Encounter (Signed)
RF request for gabapentin 376m LOV: 09/06/18 Next ov: 10/31/18 Last written: 02/13/19 #360 x 1 refill   Called patient to see if she will have enough of her medication until her appointment. She said that she will run out this Sunday. Please advise, thank you

## 2018-10-31 ENCOUNTER — Telehealth: Payer: Self-pay

## 2018-10-31 ENCOUNTER — Encounter: Payer: Self-pay | Admitting: Family Medicine

## 2018-10-31 ENCOUNTER — Ambulatory Visit: Payer: Managed Care, Other (non HMO) | Admitting: Family Medicine

## 2018-10-31 ENCOUNTER — Other Ambulatory Visit: Payer: Self-pay

## 2018-10-31 VITALS — BP 110/65 | HR 93 | Temp 98.1°F | Resp 16 | Ht 65.0 in | Wt 255.8 lb

## 2018-10-31 DIAGNOSIS — J4531 Mild persistent asthma with (acute) exacerbation: Secondary | ICD-10-CM | POA: Diagnosis not present

## 2018-10-31 MED ORDER — PREDNISONE 20 MG PO TABS: ORAL_TABLET | ORAL | 0 refills | Status: DC

## 2018-10-31 MED ORDER — BUDESONIDE-FORMOTEROL FUMARATE 160-4.5 MCG/ACT IN AERO: 2.0000 | INHALATION_SPRAY | Freq: Two times a day (BID) | RESPIRATORY_TRACT | 12 refills | Status: DC

## 2018-10-31 MED ORDER — FLUTICASONE-SALMETEROL 250-50 MCG/DOSE IN AEPB: INHALATION_SPRAY | RESPIRATORY_TRACT | 11 refills | Status: DC

## 2018-10-31 NOTE — Telephone Encounter (Signed)
Pt called and left voicemail requesting different inhaler to be sent. The symbicort is out of stock. Pt just had a visit today.   Please advise, thanks.

## 2018-10-31 NOTE — Progress Notes (Signed)
OFFICE VISIT  10/31/2018   CC:  Chief Complaint  Patient presents with  . trouble breathing    recently quit smoking    HPI:    Patient is a 53 y.o. Caucasian female who presents for shortness of breath.  The last 5 mo or so has felt increased SOB esp exhalation, +wheezing only occ, feeling tight in chest. Gives her a HA and makes using CPAP very difficult.  Always has a feeling of SOB ---as her baseline---seems to have worsened since quitting smoking 2 mo ago.  No SSCP.  No cough.  No URI sx's. Albut inhaler 2p-4p helpful. No fevers.    Past Medical History:  Diagnosis Date  . ADD (attention deficit disorder with hyperactivity)   . Allergic rhinitis   . Atypical chest pain    Myocardial perf imaging 04/04/16: Low risk stress nuclear study with prior inferior infarct and mild peri-infarct ischemia; EF 55 with mild hypokinesis of the inferior wall.  Cath normal, so stress test was FALSE POS.  . Bowel obstruction ([place]) 06-25-2010   post mastectomy  . Breast cancer ([place]) 2012   left mastectomy and chemotherapy  . Cholelithiasis 03/2016   u/s: no signs of cholecystitis  . Depression   . GERD (gastroesophageal reflux disease)   . Hyperlipidemia   . Hypertension   . Mild persistent asthma    dx'd age 53 (Dr. Gwenette Greet) hx of noncompliance with controller therapy due to cost  . Morbid obesity ([place])   . NASH (nonalcoholic steatohepatitis) 02/2010   u/s confirmed 02/2010 (hx of transaminitis).  Repeat u/s 03/2016 showed no sign of cirrhosis or liver mass, just moderate hepatic steatosis. AST/ALT stable 05/2018.  Marland Kitchen Neuropathy   . OSA (obstructive sleep apnea)    Dr Gwenette Greet initially: pt couldn't tolerate CPAP.  Re-eval 12/2017 re-confirmed severe OSA, CPAP at 11 recommended.  . Ovarian mass    resected 07/12/10  . RLS (restless legs syndrome)   . Type II or unspecified type diabetes mellitus without mention of complication, uncontrolled 05/15/2012    Past Surgical History:  Procedure  Laterality Date  . BILATERAL SALPINGOOPHORECTOMY  07/12/2010   enlarged ovary  . CARDIOVASCULAR STRESS TEST  04/04/2016   Low risk stress nuclear study with prior inferior infarct and mild peri-infarct ischemia; EF 55% with mild hypokinesis of the inferior wall.  F/u cath was NORMAL, so this is suspected to be a false pos stress test (diaphragmatic attenuation)  . G 2 P 2    . LEFT HEART CATH AND CORONARY ANGIOGRAPHY N/A 04/20/2016   Normal coronaries.  EF 55-65%, normal LV function. (Stress test prior to this was false pos due to diaphragmatic attenuation). Procedure: Left Heart Cath and Coronary Angiography;  Surgeon: Peter M Martinique, MD;  Location: [place] [facility];  Service: Cardiovascular;  Laterality: N/A;  . MASTECTOMY  06/21/2010   Left ; Dr [facility]    Outpatient Medications Prior to Visit  Medication Sig Dispense Refill  . albuterol (VENTOLIN HFA) 108 (90 Base) MCG/ACT inhaler INHALE 1 PUFF EVERY 6 HOURS AS NEEDED FOR SHORTNESS OF BREATH AND WHEEZING 8.5 g 0  . atorvastatin (LIPITOR) 40 MG tablet TAKE ONE TABLET BY MOUTH DAILY 90 tablet 1  . CALCIUM-VITAMIN D PO Take by mouth.    . cetirizine (ZYRTEC) 10 MG tablet Take 10 mg by mouth daily.      . citalopram (CELEXA) 40 MG tablet TAKE ONE TABLET BY MOUTH DAILY 90 tablet 1  . clonazePAM (KLONOPIN) 0.5  MG tablet TAKE 1-2 TABLETS BY MOUTH EVERY EVENING AT BEDTIME AS NEEDED FOR INSOMNIA 60 tablet 5  . dextroamphetamine (DEXEDRINE SPANSULE) 10 MG 24 hr capsule 2 caps po qAM 60 capsule 0  . gabapentin (NEURONTIN) 300 MG capsule TAKE 2 CAPSULES (600 MG TOTAL) BY MOUTH TWO TIMES A DAY 360 capsule 3  . ibuprofen (ADVIL,MOTRIN) 200 MG tablet Take 800 mg by mouth as needed for fever or moderate pain.     . Insulin Pen Needle (BD PEN NEEDLE NANO U/F) 32G X 4 MM MISC Use with Victoza as directed once daily 100 each 1  . liraglutide (VICTOZA) 18 MG/3ML SOPN INJECT 1.2 MG SUBCUTANEOUSLY DAILY 9 mL 0  . magnesium oxide (MAG-OX) 400 MG  tablet Take 400 mg by mouth daily.    . Multiple Vitamin (MULTIVITAMIN) tablet Take 1 tablet by mouth daily.    Marland Kitchen rOPINIRole (REQUIP) 0.5 MG tablet TAKE ONE TABLET BY MOUTH DAILY 90 tablet 2  . triamterene-hydrochlorothiazide (MAXZIDE-25) 37.5-25 MG tablet TAKE 1/2 TABLET BY MOUTH DAILY 45 tablet 1  . verapamil (CALAN) 120 MG tablet TAKE ONE-HALF TABLET BY MOUTH TWO TIMES A DAY 90 tablet 0   No facility-administered medications prior to visit.     Allergies  Allergen Reactions  . Metoprolol Other (See Comments)    REACTION: " chest pain"  . Sulfonamide Derivatives     nausea    ROS As per HPI  PE: Blood pressure 110/65, pulse 93, temperature 98.1 F (36.7 C), temperature source Temporal, resp. rate 16, height 5' 5"  (1.651 m), weight 255 lb 12.8 oz (116 kg), last menstrual period 07/14/2010, SpO2 94 %. Body mass index is 42.57 kg/m.  Gen: Alert, well appearing.  Patient is oriented to person, place, time, and situation. AFFECT: pleasant, lucid thought and speech. GYI:RSWN: no injection, icteris, swelling, or exudate.  EOMI, PERRLA. Mouth: lips without lesion/swelling.  Oral mucosa pink and moist. Oropharynx without erythema, exudate, or swelling.  CV: RRR, no m/r/g.   LUNGS: CTA bilat except occ soft exp wheeze, nonlabored resps, some diffusely diminished BS particularly on expiration. ABD: soft, NT/ND EXT: no clubbing or cyanosis.    LABS:  Lab Results  Component Value Date   TSH 1.72 10/03/2017   Lab Results  Component Value Date   WBC 7.6 10/03/2017   HGB 15.6 (H) 10/03/2017   HCT 45.2 10/03/2017   MCV 92.3 10/03/2017   PLT 303.0 10/03/2017   Lab Results  Component Value Date   CREATININE 0.64 05/27/2018   BUN 14 05/27/2018   NA 141 05/27/2018   K 4.6 05/27/2018   CL 104 05/27/2018   CO2 31 05/27/2018   Lab Results  Component Value Date   ALT 96 (H) 05/27/2018   AST 46 (H) 05/27/2018   ALKPHOS 42 05/27/2018   BILITOT 0.6 05/27/2018   Lab Results   Component Value Date   CHOL 124 10/03/2017   Lab Results  Component Value Date   HDL 30.00 (L) 10/03/2017   Lab Results  Component Value Date   LDLCALC 44 04/12/2016   Lab Results  Component Value Date   TRIG 229.0 (H) 10/03/2017   Lab Results  Component Value Date   CHOLHDL 4 10/03/2017   Lab Results  Component Value Date   HGBA1C 6.7 (A) 09/06/2018   HGBA1C 6.7 09/06/2018   HGBA1C 6.7 (A) 09/06/2018   HGBA1C 6.7 09/06/2018    IMPRESSION AND PLAN:  1) Mild/mod persistent asthma, poor control. Former smoker, just  quit 2 mo ago. Plan is to start symbicort 160/4.5, 2 p bid. Prednisone 40 x 5, 20 x 5, and 10 x 5. Continue albut hfa 2p q4h prn.  An After Visit Summary was printed and given to the patient.  FOLLOW UP: Return for 10-14d f/u asthma and discuss joint/muscle complaints.  Signed:  Crissie Sickles, MD           10/31/2018

## 2018-11-01 NOTE — Telephone Encounter (Signed)
LM advising pt new inhaler sent on 8/20 to preferred pharmacy, okay per DPR.

## 2018-11-07 ENCOUNTER — Other Ambulatory Visit: Payer: Self-pay

## 2018-11-07 ENCOUNTER — Encounter: Payer: Self-pay | Admitting: Family Medicine

## 2018-11-07 ENCOUNTER — Emergency Department (HOSPITAL_COMMUNITY)
Admission: EM | Admit: 2018-11-07 | Discharge: 2018-11-08 | Disposition: A | Discharge status: Home / Self Care | Payer: Managed Care, Other (non HMO) | Attending: Emergency Medicine | Admitting: Emergency Medicine

## 2018-11-07 ENCOUNTER — Encounter (HOSPITAL_COMMUNITY): Payer: Self-pay | Admitting: Emergency Medicine

## 2018-11-07 ENCOUNTER — Emergency Department (HOSPITAL_COMMUNITY): Payer: Managed Care, Other (non HMO)

## 2018-11-07 DIAGNOSIS — E119 Type 2 diabetes mellitus without complications: Secondary | ICD-10-CM | POA: Diagnosis not present

## 2018-11-07 DIAGNOSIS — I10 Essential (primary) hypertension: Secondary | ICD-10-CM | POA: Diagnosis not present

## 2018-11-07 DIAGNOSIS — J45909 Unspecified asthma, uncomplicated: Secondary | ICD-10-CM | POA: Insufficient documentation

## 2018-11-07 DIAGNOSIS — Z7984 Long term (current) use of oral hypoglycemic drugs: Secondary | ICD-10-CM | POA: Diagnosis not present

## 2018-11-07 DIAGNOSIS — Z87891 Personal history of nicotine dependence: Secondary | ICD-10-CM | POA: Diagnosis not present

## 2018-11-07 DIAGNOSIS — J209 Acute bronchitis, unspecified: Secondary | ICD-10-CM | POA: Diagnosis not present

## 2018-11-07 DIAGNOSIS — Z79899 Other long term (current) drug therapy: Secondary | ICD-10-CM | POA: Diagnosis not present

## 2018-11-07 DIAGNOSIS — R0602 Shortness of breath: Secondary | ICD-10-CM | POA: Diagnosis present

## 2018-11-07 DIAGNOSIS — Z853 Personal history of malignant neoplasm of breast: Secondary | ICD-10-CM | POA: Insufficient documentation

## 2018-11-07 NOTE — Telephone Encounter (Signed)
Called patient and she stated that she is sweating along with having issues breathing. Patient said that she is having headaches. Inhaler is not really working for her. Her SOB is worse when she is doing activities. Using inhaler 8 times a day. She doesn't have a fever nor a cough. She quit smoking almost 2 months ago; she believes this is why she is having SOB. Advised patient she should go to the hospital since she is using her inhaler often and still has issues breathing. Patient stated that she will go to [facility].

## 2018-11-07 NOTE — Telephone Encounter (Signed)
Do you have any suggestions for the patient?

## 2018-11-07 NOTE — ED Triage Notes (Signed)
Pt reports SOB "every I since quit smoking the middle of June". PT saw Dr [name] one week ago and prescribed Advair and prednisone. Pt says she has been taking this regularly as prescribed and using her albuterol MDI and breathing is not improving.

## 2018-11-07 NOTE — ED Provider Notes (Signed)
[facility] EMERGENCY DEPARTMENT Provider Note   CSN: [id number] Arrival date & time: 11/07/18  1946     History   Chief Complaint Chief Complaint  Patient presents with   Shortness of Breath    HPI [name] is a 53 y.o. female.     HPI  This patient is a very pleasant 53 year old female, she presents to the hospital today with a complaint of shortness of breath which she states is been rather persistent ever since stopping smoking 2 months ago.  She has been seen by her family doctor and has already been placed on Advair, albuterol and prednisone but states that despite these medications she never really has any relief.  She is taking multiple doses of albuterol multiple doses per day and is not feeling like it helps.  She has no coughing, no positional dyspnea or orthopnea, she is not exertionally dyspneic and she has no chest discomfort.  She reports that she uses a CPAP at night but because she is not able to breathe in a normal pattern she feels like the CPAP is not helping either.  She never had these problems when she was smoking cigarettes.  She has used nicotine oral pouches as well as nicotine gum and has been doing very well with that.  She denies any swelling of the legs, recent travel, trauma, immobilization, surgery or hormone use.  The patient does not have active cancer.  She has had a prior catheterization.  I have personally reviewed the medical record and found that she had a heart catheterization 2 years ago which showed no signs of coronary disease whatsoever.  Past Medical History:  Diagnosis Date   ADD (attention deficit disorder with hyperactivity)    Allergic rhinitis    Atypical chest pain    Myocardial perf imaging 04/04/16: Low risk stress nuclear study with prior inferior infarct and mild peri-infarct ischemia; EF 55 with mild hypokinesis of the inferior wall.  Cath normal, so stress test was FALSE POS.   Bowel obstruction ([place]) 06-25-2010   post  mastectomy   Breast cancer ([place]) 2012   left mastectomy and chemotherapy   Cholelithiasis 03/2016   u/s: no signs of cholecystitis   Depression    GERD (gastroesophageal reflux disease)    Hyperlipidemia    Hypertension    Mild persistent asthma    dx'd age 63 (Dr. Gwenette Greet) hx of noncompliance with controller therapy due to cost   Morbid obesity ([place])    NASH (nonalcoholic steatohepatitis) 02/2010   u/s confirmed 02/2010 (hx of transaminitis).  Repeat u/s 03/2016 showed no sign of cirrhosis or liver mass, just moderate hepatic steatosis. AST/ALT stable 05/2018.   Neuropathy    OSA (obstructive sleep apnea)    Dr Gwenette Greet initially: pt couldn't tolerate CPAP.  Re-eval 12/2017 re-confirmed severe OSA, CPAP at 11 recommended.   Ovarian mass    resected 07/12/10   RLS (restless legs syndrome)    Type II or unspecified type diabetes mellitus without mention of complication, uncontrolled 05/15/2012    Patient Active Problem List   Diagnosis Date Noted   Abnormal nuclear stress test 04/12/2016   Cervical cancer screening 09/20/2015   UTI (urinary tract infection) 08/27/2015   Maxillary sinusitis, acute 06/22/2015   Lymphadenitis 06/22/2015   Infection of urinary tract 02/12/2015   Eustachian tube dysfunction 08/31/2013   BPPV (benign paroxysmal positional vertigo) 04/21/2013   Adult ADHD 04/21/2013   Cellulitis 03/07/2013   Obesity, Class III, BMI 40-49.9 (morbid obesity) (  [place]) 06/24/2012   Diabetes mellitus without complication ([place]) 70/62/3762   Peripheral neuropathy 08/18/2011   Breast cancer of upper-inner quadrant of left female breast ([place]) 04/19/2011   TRANSAMINASES, SERUM, ELEVATED 05/09/2010   Hyperlipidemia, mixed 04/26/2010   SMOKER 04/26/2010   Fatty liver 02/10/2010   Essential hypertension 04/18/2007   ALLERGIC RHINITIS 04/18/2007   Asthma, mild persistent 04/18/2007   Obstructive sleep apnea 04/10/2007   RESTLESS LEGS SYNDROME  04/10/2007    Past Surgical History:  Procedure Laterality Date   BILATERAL SALPINGOOPHORECTOMY  07/12/2010   enlarged ovary   CARDIOVASCULAR STRESS TEST  04/04/2016   Low risk stress nuclear study with prior inferior infarct and mild peri-infarct ischemia; EF 55% with mild hypokinesis of the inferior wall.  F/u cath was NORMAL, so this is suspected to be a false pos stress test (diaphragmatic attenuation)   G 2 P 2     LEFT HEART CATH AND CORONARY ANGIOGRAPHY N/A 04/20/2016   Normal coronaries.  EF 55-65%, normal LV function. (Stress test prior to this was false pos due to diaphragmatic attenuation). Procedure: Left Heart Cath and Coronary Angiography;  Surgeon: Peter M Martinique, MD;  Location: [facility];  Service: Cardiovascular;  Laterality: N/A;   MASTECTOMY  06/21/2010   Left ; Dr [facility]     OB History   No obstetric history on file.      Home Medications    Prior to Admission medications   Medication Sig Start Date End Date Taking? Authorizing Provider  albuterol (VENTOLIN HFA) 108 (90 Base) MCG/ACT inhaler INHALE 1 PUFF EVERY 6 HOURS AS NEEDED FOR SHORTNESS OF BREATH AND WHEEZING 10/21/18   McGowen, Adrian Blackwater, MD  atorvastatin (LIPITOR) 40 MG tablet TAKE ONE TABLET BY MOUTH DAILY 02/15/18   McGowen, Adrian Blackwater, MD  CALCIUM-VITAMIN D PO Take by mouth.    [provider]  cetirizine (ZYRTEC) 10 MG tablet Take 10 mg by mouth daily.      [provider]  citalopram (CELEXA) 40 MG tablet TAKE ONE TABLET BY MOUTH DAILY 05/14/18   McGowen, Adrian Blackwater, MD  clonazePAM (KLONOPIN) 0.5 MG tablet TAKE 1-2 TABLETS BY MOUTH EVERY EVENING AT BEDTIME AS NEEDED FOR INSOMNIA 09/06/18   McGowen, Adrian Blackwater, MD  dextroamphetamine (DEXEDRINE SPANSULE) 10 MG 24 hr capsule 2 caps po qAM 09/06/18   McGowen, Adrian Blackwater, MD  Fluticasone-Salmeterol (ADVAIR DISKUS) 250-50 MCG/DOSE AEPB 1 inhalation bid 10/31/18   McGowen, Adrian Blackwater, MD  gabapentin (NEURONTIN) 300 MG capsule TAKE 2  CAPSULES (600 MG TOTAL) BY MOUTH TWO TIMES A DAY 10/27/18   McGowen, Adrian Blackwater, MD  ibuprofen (ADVIL,MOTRIN) 200 MG tablet Take 800 mg by mouth as needed for fever or moderate pain.     [provider]  Insulin Pen Needle (BD PEN NEEDLE NANO U/F) 32G X 4 MM MISC Use with Victoza as directed once daily 06/15/17   McGowen, Adrian Blackwater, MD  liraglutide (VICTOZA) 18 MG/3ML SOPN INJECT 1.2 MG SUBCUTANEOUSLY DAILY 10/11/18   McGowen, Adrian Blackwater, MD  magnesium oxide (MAG-OX) 400 MG tablet Take 400 mg by mouth daily.    [provider]  Multiple Vitamin (MULTIVITAMIN) tablet Take 1 tablet by mouth daily.    [provider]  predniSONE (DELTASONE) 20 MG tablet 2 tabs po qd x 5d, then 1 tab po qd x 5d, then 1/2 tab po qd x 6d 10/31/18   McGowen, Adrian Blackwater, MD  rOPINIRole (REQUIP) 0.5 MG tablet TAKE ONE TABLET  BY MOUTH DAILY 09/06/18   McGowen, Adrian Blackwater, MD  triamterene-hydrochlorothiazide (MAXZIDE-25) 37.5-25 MG tablet TAKE 1/2 TABLET BY MOUTH DAILY 02/15/18   McGowen, Adrian Blackwater, MD  verapamil (CALAN) 120 MG tablet TAKE ONE-HALF TABLET BY MOUTH TWO TIMES A DAY 10/11/18   McGowen, Adrian Blackwater, MD    Family History Family History  Adopted: Yes  Problem Relation Age of Onset   Healthy Son        39   Healthy Daughter        61    Social History Social History   Tobacco Use   Smoking status: Former Smoker    Packs/day: 0.50    Years: 24.00    Pack years: 12.00    Types: Cigarettes    Quit date: 08/16/2018    Years since quitting: 0.2   Smokeless tobacco: Never Used  Substance Use Topics   Alcohol use: No   Drug use: No     Allergies   Metoprolol and Sulfonamide derivatives   Review of Systems Review of Systems  All other systems reviewed and are negative.    Physical Exam Updated Vital Signs BP 122/75 (BP Location: Right Arm)    Pulse 89    Temp 98.2 F (36.8 C) (Oral)    Resp 16    Ht 1.651 m (5' 5" )    Wt 115.7 kg    LMP 07/14/2010 Comment: ovaries removed 2012    SpO2 95%    BMI 42.43 kg/m   Physical Exam Vitals signs and nursing note reviewed.  Constitutional:      General: She is not in acute distress.    Appearance: She is well-developed.  HENT:     Head: Normocephalic and atraumatic.     Mouth/Throat:     Pharynx: No oropharyngeal exudate.  Eyes:     General: No scleral icterus.       Right eye: No discharge.        Left eye: No discharge.     Conjunctiva/sclera: Conjunctivae normal.     Pupils: Pupils are equal, round, and reactive to light.  Neck:     Musculoskeletal: Normal range of motion and neck supple.     Thyroid: No thyromegaly.     Vascular: No JVD.  Cardiovascular:     Rate and Rhythm: Normal rate and regular rhythm.     Heart sounds: Normal heart sounds. No murmur. No friction rub. No gallop.   Pulmonary:     Effort: Pulmonary effort is normal. No respiratory distress.     Breath sounds: Normal breath sounds. No wheezing or rales.  Abdominal:     General: Bowel sounds are normal. There is no distension.     Palpations: Abdomen is soft. There is no mass.     Tenderness: There is no abdominal tenderness.  Musculoskeletal: Normal range of motion.        General: No tenderness.  Lymphadenopathy:     Cervical: No cervical adenopathy.  Skin:    General: Skin is warm and dry.     Findings: No erythema or rash.  Neurological:     Mental Status: She is alert.     Coordination: Coordination normal.  Psychiatric:        Behavior: Behavior normal.      ED Treatments / Results  Labs (all labs ordered are listed, but only abnormal results are displayed) Labs Reviewed  CBC WITH DIFFERENTIAL/PLATELET  COMPREHENSIVE METABOLIC PANEL    EKG EKG Interpretation  Date/Time:  Thursday November 07 2018 20:22:09 EDT Ventricular Rate:  89 PR Interval:  136 QRS Duration: 76 QT Interval:  352 QTC Calculation: 428 R Axis:   55 Text Interpretation:  Normal sinus rhythm Normal ECG since last tracing no significant change  Confirmed by Noemi Chapel [phone]) on 11/07/2018 10:14:58 PM   Radiology Dg Chest 2 View  Result Date: 11/07/2018 CLINICAL DATA:  SOB "every I since quit smoking the middle of June", chronic cough per patient. History of asthma, breast cancer, DM, HTN, GERD, Ovarian mass. Denies chest pain. EXAM: CHEST - 2 VIEW COMPARISON:  Chest radiograph 07/23/2015, 01/12/2012 FINDINGS: The heart size and mediastinal contours are within normal limits. Increased bilateral bronchovascular markings as can be seen in bronchitis. No focal infiltrate. No pneumothorax or pleural effusion. Multilevel degenerative changes in the thoracic spine. IMPRESSION: Increased bronchovascular markings as can be seen in reactive airways disease/bronchitis. No focal infiltrate. Electronically Signed   By: Audie Pinto M.D.   On: 11/07/2018 20:56    Procedures Procedures (including critical care time)  Medications Ordered in ED Medications - No data to display   Initial Impression / Assessment and Plan / ED Course  I have reviewed the triage vital signs and the nursing notes.  Pertinent labs & imaging results that were available during my care of the patient were reviewed by me and considered in my medical decision making (see chart for details).        The patient's exam is unremarkable, her chest x-ray shows no acute findings, her EKG is totally normal and she has no tachycardia.  She has no risk factors for pulmonary embolism, she has been chronically short of breath ever since stopping smoking which I suspect is related to more her breathing pattern than anything else.  We will rule out anemia, electrolyte disturbance given that she is on a diuretic, she is agreeable to the plan.  Change of shift - care signed out to Dr. Betsey Holiday  Final Clinical Impressions(s) / ED Diagnoses   Final diagnoses:  None    ED Discharge Orders    None       Noemi Chapel, MD 11/07/18 2315

## 2018-11-08 LAB — CBC WITH DIFFERENTIAL/PLATELET
Basophils Absolute: 0 10*3/uL (ref 0.0–0.1)
Basophils Relative: 0 %
Eosinophils Absolute: 0.7 10*3/uL — ABNORMAL HIGH (ref 0.0–0.5)
Eosinophils Relative: 4 %
HCT: 42 % (ref 36.0–46.0)
Hemoglobin: 14.4 g/dL (ref 12.0–15.0)
Lymphocytes Relative: 17 %
Lymphs Abs: 2.8 10*3/uL (ref 0.7–4.0)
MCH: 31.7 pg (ref 26.0–34.0)
MCHC: 34.3 g/dL (ref 30.0–36.0)
MCV: 92.5 fL (ref 80.0–100.0)
Monocytes Absolute: 0.2 10*3/uL (ref 0.1–1.0)
Monocytes Relative: 1 %
Neutro Abs: 13 10*3/uL — ABNORMAL HIGH (ref 1.7–7.7)
Neutrophils Relative %: 78 %
Platelets: 313 10*3/uL (ref 150–400)
RBC: 4.54 MIL/uL (ref 3.87–5.11)
RDW: 12.3 % (ref 11.5–15.5)
WBC: 16.7 10*3/uL — ABNORMAL HIGH (ref 4.0–10.5)
nRBC: 0 % (ref 0.0–0.2)

## 2018-11-08 LAB — COMPREHENSIVE METABOLIC PANEL
ALT: 94 U/L — ABNORMAL HIGH (ref 0–44)
AST: 47 U/L — ABNORMAL HIGH (ref 15–41)
Albumin: 4 g/dL (ref 3.5–5.0)
Alkaline Phosphatase: 43 U/L (ref 38–126)
Anion gap: 13 (ref 5–15)
BUN: 19 mg/dL (ref 6–20)
CO2: 23 mmol/L (ref 22–32)
Calcium: 9.2 mg/dL (ref 8.9–10.3)
Chloride: 100 mmol/L (ref 98–111)
Creatinine, Ser: 0.63 mg/dL (ref 0.44–1.00)
GFR calc Af Amer: >60 mL/min (ref >60)
GFR calc non Af Amer: >60 mL/min (ref >60)
Glucose, Bld: 168 mg/dL — ABNORMAL HIGH (ref 70–99)
Potassium: 4 mmol/L (ref 3.5–5.1)
Sodium: 136 mmol/L (ref 135–145)
Total Bilirubin: 0.5 mg/dL (ref 0.3–1.2)
Total Protein: 7.4 g/dL (ref 6.5–8.1)

## 2018-11-08 MED ORDER — DOXYCYCLINE HYCLATE 100 MG PO CAPS: 100.0000 mg | ORAL_CAPSULE | Freq: Two times a day (BID) | ORAL | 0 refills | Status: DC

## 2018-11-08 MED ORDER — ALBUTEROL SULFATE (2.5 MG/3ML) 0.083% IN NEBU: 2.5000 mg | INHALATION_SOLUTION | Freq: Four times a day (QID) | RESPIRATORY_TRACT | 12 refills | Status: DC | PRN

## 2018-11-08 NOTE — Telephone Encounter (Signed)
Called patient and she is feeling a little better but tired from being at the ED. She stated that she will keep [place] updated on how she feels and will schedule an appointment if she isnt feeling better within a week.

## 2018-11-08 NOTE — Telephone Encounter (Signed)
Called patient and lvm. Okay per DPR. Explained that Dr.McGowen would like to see her in 1 week if she is not feeling better. Patient has appointment on 11/27/18

## 2018-11-08 NOTE — ED Provider Notes (Signed)
Patient signed out to me by Dr. Sabra Heck to follow-up on work-up.  Work-up has been reviewed.  She does have a leukocytosis, likely secondary to prednisone use.  She appears well.  She has not hypoxic.  Chest x-ray reviewed, she has evidence of bronchitis but no evidence of pneumonia.  She is in the middle of a prednisone taper, will continue.  Continue albuterol.  We will additionally provide albuterol for nebulizer.  She has a machine at home but no nebulizer solution.  Also add doxycycline, follow-up with PCP.   Orpah Greek, MD 11/08/18 [phone]

## 2018-11-08 NOTE — Telephone Encounter (Signed)
(  [facility] ED w/u reviewed she still may need further w/u to exclude PE or CAD as the cause).   Pt has f/u appt set with me on 11/27/18 but tell her to call if not better in 1 wk and I'll work her in.-thx

## 2018-11-12 ENCOUNTER — Encounter: Payer: Self-pay | Admitting: Family Medicine

## 2018-11-13 MED ORDER — NYSTATIN 100000 UNIT/ML MT SUSP: OROMUCOSAL | 0 refills | Status: DC

## 2018-11-13 NOTE — Telephone Encounter (Signed)
I sent in rx for nystatin suspension.

## 2018-11-14 ENCOUNTER — Other Ambulatory Visit: Payer: Self-pay | Admitting: Family Medicine

## 2018-11-14 NOTE — Telephone Encounter (Signed)
I called the pharm because it seemed too soon for a refill. They stated they can only fill 67m at a time because of her insurance. Instead of [id number] you okay with changing it to 20m51m

## 2018-11-26 ENCOUNTER — Other Ambulatory Visit: Payer: Self-pay | Admitting: Family Medicine

## 2018-11-27 ENCOUNTER — Other Ambulatory Visit: Payer: Self-pay

## 2018-11-27 ENCOUNTER — Ambulatory Visit (INDEPENDENT_AMBULATORY_CARE_PROVIDER_SITE_OTHER): Payer: Managed Care, Other (non HMO) | Admitting: Family Medicine

## 2018-11-27 ENCOUNTER — Encounter: Payer: Self-pay | Admitting: Family Medicine

## 2018-11-27 DIAGNOSIS — M797 Fibromyalgia: Secondary | ICD-10-CM

## 2018-11-27 DIAGNOSIS — J4531 Mild persistent asthma with (acute) exacerbation: Secondary | ICD-10-CM | POA: Diagnosis not present

## 2018-11-27 NOTE — Progress Notes (Signed)
Virtual Visit via Video Note  I connected with pt on 11/27/18 at  4:00 PM EDT by a video enabled telemedicine application and verified that I am speaking with the correct person using two identifiers.  Location patient: home Location provider:work or home office Persons participating in the virtual visit: patient, provider  I discussed the limitations of evaluation and management by telemedicine and the availability of in person appointments. The patient expressed understanding and agreed to proceed.  Telemedicine visit is a necessity given the COVID-19 restrictions in place at the current time.  HPI: 53 y/o WF being seen today for 2 week f/u asthma and also to discuss joint complaints that we did not get to discuss at the time of her last visit. Her asthma sx's were not improving after seeing me and getting on prednisone and symbicort. She went to the ED 11/07/18 where her eval should leukocytosis likely secondary to prednisone use, CXR with bronchitis but no pneumonia.  She was continued on prednisone taper and doxycycline was added.  Also she was set up with neb machine for her albuterol.  Interim hx: Lungs getting better. No albut in 3d, then had to have this a lot last night for SOB and chest tightness, difficulty getting air out.   Needs to restart advair.  Has not had to use any albuterol at all today. She finished her prednisone 4-5 days ago. She finished the doxycycline the ED rx'd.  She has long hx of intermittent joint pains, sometimes EVERY joint in her body and other times just a few regions.  Head and neck and upper back often.  Stomach hurts some during these times.  Generally feels bad. Feels foggy headed a lot, even when not in the midst of joint problems. Also pain in muscles adjacent to her joints that hurt.  No swelling or redness of joints. Autoimmune/rheum labs in remote past have been neg. +hx of depression->says citalopram is helping this and she would prefer to avoid  changing this (to cymbalta, for example).  Gabapentin very helpful for neuropathic pain and she does not want to come off of this.  She has never been on lyrica.    ROS: no CP, no dizziness, no HAs, no rashes, no melena/hematochezia.  No polyuria or polydipsia.  No myalgias or arthralgias. No eye redness or pain.  No unexplained rashes. No oral ulcerations.  Past Medical History:  Diagnosis Date  . ADD (attention deficit disorder with hyperactivity)   . Allergic rhinitis   . Atypical chest pain    Myocardial perf imaging 04/04/16: Low risk stress nuclear study with prior inferior infarct and mild peri-infarct ischemia; EF 55 with mild hypokinesis of the inferior wall.  Cath normal, so stress test was FALSE POS.  . Bowel obstruction ([place]) 06-25-2010   post mastectomy  . Breast cancer ([place]) 2012   left mastectomy and chemotherapy  . Cholelithiasis 03/2016   u/s: no signs of cholecystitis  . Depression   . GERD (gastroesophageal reflux disease)   . Hyperlipidemia   . Hypertension   . Mild persistent asthma    dx'd age 73 (Dr. Gwenette Greet) hx of noncompliance with controller therapy due to cost  . Morbid obesity ([place])   . NASH (nonalcoholic steatohepatitis) 02/2010   u/s confirmed 02/2010 (hx of transaminitis).  Repeat u/s 03/2016 showed no sign of cirrhosis or liver mass, just moderate hepatic steatosis. AST/ALT stable 05/2018.  [place] Neuropathy   . OSA (obstructive sleep apnea)    Dr Gwenette Greet initially:  pt couldn't tolerate CPAP.  Re-eval 12/2017 re-confirmed severe OSA, CPAP at 11 recommended.  . Ovarian mass    resected 07/12/10  . RLS (restless legs syndrome)   . Type II or unspecified type diabetes mellitus without mention of complication, uncontrolled 05/15/2012    Past Surgical History:  Procedure Laterality Date  . BILATERAL SALPINGOOPHORECTOMY  07/12/2010   enlarged ovary  . CARDIOVASCULAR STRESS TEST  04/04/2016   Low risk stress nuclear study with prior inferior infarct and mild  peri-infarct ischemia; EF 55% with mild hypokinesis of the inferior wall.  F/u cath was NORMAL, so this is suspected to be a false pos stress test (diaphragmatic attenuation)  . G 2 P 2    . LEFT HEART CATH AND CORONARY ANGIOGRAPHY N/A 04/20/2016   Normal coronaries.  EF 55-65%, normal LV function. (Stress test prior to this was false pos due to diaphragmatic attenuation). Procedure: Left Heart Cath and Coronary Angiography;  Surgeon: Peter M Martinique, MD;  Location: [facility];  Service: Cardiovascular;  Laterality: N/A;  . MASTECTOMY  06/21/2010   Left ; Dr [facility]    Family History  Adopted: Yes  Problem Relation Age of Onset  . Healthy Son        29  . Healthy Daughter        56     Current Outpatient Medications:  .  albuterol (PROVENTIL) (2.5 MG/3ML) 0.083% nebulizer solution, Take 3 mLs (2.5 mg total) by nebulization every 6 (six) hours as needed for wheezing or shortness of breath., Disp: 75 mL, Rfl: 12 .  albuterol (VENTOLIN HFA) 108 (90 Base) MCG/ACT inhaler, INHALE 1 PUFF EVERY 6 HOURS AS NEEDED FOR SHORTNESS OF BREATH AND WHEEZING, Disp: 8.5 g, Rfl: 0 .  atorvastatin (LIPITOR) 40 MG tablet, TAKE ONE TABLET BY MOUTH DAILY, Disp: 90 tablet, Rfl: 1 .  CALCIUM-VITAMIN D PO, Take by mouth., Disp: , Rfl:  .  cetirizine (ZYRTEC) 10 MG tablet, Take 10 mg by mouth daily.  , Disp: , Rfl:  .  citalopram (CELEXA) 40 MG tablet, TAKE ONE TABLET BY MOUTH DAILY, Disp: 90 tablet, Rfl: 1 .  clonazePAM (KLONOPIN) 0.5 MG tablet, TAKE 1-2 TABLETS BY MOUTH EVERY EVENING AT BEDTIME AS NEEDED FOR INSOMNIA, Disp: 60 tablet, Rfl: 5 .  dextroamphetamine (DEXEDRINE SPANSULE) 10 MG 24 hr capsule, 2 caps po qAM, Disp: 60 capsule, Rfl: 0 .  Fluticasone-Salmeterol (ADVAIR DISKUS) 250-50 MCG/DOSE AEPB, 1 inhalation bid, Disp: 60 each, Rfl: 11 .  gabapentin (NEURONTIN) 300 MG capsule, TAKE 2 CAPSULES (600 MG TOTAL) BY MOUTH TWO TIMES A DAY, Disp: 360 capsule, Rfl: 3 .  ibuprofen (ADVIL,MOTRIN)  200 MG tablet, Take 800 mg by mouth as needed for fever or moderate pain. , Disp: , Rfl:  .  Insulin Pen Needle (BD PEN NEEDLE NANO U/F) 32G X 4 MM MISC, Use with Victoza as directed once daily, Disp: 100 each, Rfl: 1 .  liraglutide (VICTOZA) 18 MG/3ML SOPN, INJECT 1.2 MG SUBCUTANEOUSLY DAILY, Disp: 6 mL, Rfl: 11 .  magnesium oxide (MAG-OX) 400 MG tablet, Take 400 mg by mouth daily., Disp: , Rfl:  .  Multiple Vitamin (MULTIVITAMIN) tablet, Take 1 tablet by mouth daily., Disp: , Rfl:  .  rOPINIRole (REQUIP) 0.5 MG tablet, TAKE ONE TABLET BY MOUTH DAILY, Disp: 90 tablet, Rfl: 2 .  triamterene-hydrochlorothiazide (MAXZIDE-25) 37.5-25 MG tablet, TAKE 1/2 TABLET BY MOUTH DAILY, Disp: 45 tablet, Rfl: 0 .  verapamil (CALAN) 120 MG tablet, TAKE ONE-HALF  TABLET BY MOUTH TWO TIMES A DAY, Disp: 90 tablet, Rfl: 0   EXAM:  VITALS per patient if applicable: LMP 67/89/3810 Comment: ovaries removed 2012   GENERAL: alert, oriented, appears well and in no acute distress  HEENT: atraumatic, conjunttiva clear, no obvious abnormalities on inspection of external nose and ears  NECK: normal movements of the head and neck  LUNGS: on inspection no signs of respiratory distress, breathing rate appears normal, no obvious gross SOB, gasping or wheezing  CV: no obvious cyanosis  MS: moves all visible extremities without noticeable abnormality  PSYCH/NEURO: pleasant and cooperative, no obvious depression or anxiety, speech and thought processing grossly intact  LABS: none today    Chemistry      Component Value Date/Time   NA 136 11/07/2018 2342   NA 141 01/15/2015 0911   K 4.0 11/07/2018 2342   K 3.6 01/15/2015 0911   CL 100 11/07/2018 2342   CO2 23 11/07/2018 2342   CO2 27 01/15/2015 0911   BUN 19 11/07/2018 2342   BUN 17.2 01/15/2015 0911   CREATININE 0.63 11/07/2018 2342   CREATININE 0.67 04/12/2016 1311   CREATININE 0.8 01/15/2015 0911      Component Value Date/Time   CALCIUM 9.2 11/07/2018  2342   CALCIUM 10.7 (H) 01/15/2015 0911   ALKPHOS 43 11/07/2018 2342   ALKPHOS 42 01/15/2015 0911   AST 47 (H) 11/07/2018 2342   AST 64 (H) 01/15/2015 0911   ALT 94 (H) 11/07/2018 2342   ALT 153 (H) 01/15/2015 0911   BILITOT 0.5 11/07/2018 2342   BILITOT 0.57 01/15/2015 0911     Lab Results  Component Value Date   WBC 16.7 (H) 11/07/2018   HGB 14.4 11/07/2018   HCT 42.0 11/07/2018   MCV 92.5 11/07/2018   PLT 313 11/07/2018   Lab Results  Component Value Date   HGBA1C 6.7 (A) 09/06/2018   HGBA1C 6.7 09/06/2018   HGBA1C 6.7 (A) 09/06/2018   HGBA1C 6.7 09/06/2018   Lab Results  Component Value Date   ESRSEDRATE 31 (H) 05/19/2014   Lab Results  Component Value Date   ANA NEG 06/29/2010   RF < 20.0 IU/mL (L) 08/02/2006   ASSESSMENT AND PLAN:  Discussed the following assessment and plan:  1) Mild persistent asthma: her recent flare has resolved, but last night had signif sx's again. Feeling fine today.  If she has to use albut back-to-back-to-back again tonight then I'll restart some prednisone.  I want her to continue daily advair (insurer would not approve symbicort). She has albut to use via neb and also HFA.  2) Fibromyalgia syndrome: we discussed med options (see HPI).  She is on the fence about adding another med to her list. I'm considering adding lyrica to her gabapentin and citalopram. We'll discuss again and decide when I see her later this month for her CPE.   -we discussed possible serious and likely etiologies, options for evaluation and workup, limitations of telemedicine visit vs in person visit, treatment, treatment risks and precautions. Pt prefers to treat via telemedicine empirically rather then risking or undertaking an in person visit at this moment. Patient agrees to seek prompt in person care if worsening, new symptoms arise, or if is not improving with treatment.   I discussed the assessment and treatment plan with the patient. The patient was  provided an opportunity to ask questions and all were answered. The patient agreed with the plan and demonstrated an understanding of the instructions.  The patient was advised to call back or seek an in-person evaluation if the symptoms worsen or if the condition fails to improve as anticipated.  F/u: has appt later this month for CPE  Signed:  Crissie Sickles, MD           11/27/2018  Spent 25 min with pt today, with >50% of this time spent in counseling and care coordination regarding the above problems.

## 2018-12-09 ENCOUNTER — Encounter: Payer: Self-pay | Admitting: Family Medicine

## 2018-12-09 ENCOUNTER — Other Ambulatory Visit: Payer: Self-pay | Admitting: Family Medicine

## 2018-12-09 ENCOUNTER — Ambulatory Visit (INDEPENDENT_AMBULATORY_CARE_PROVIDER_SITE_OTHER): Payer: Managed Care, Other (non HMO) | Admitting: Family Medicine

## 2018-12-09 ENCOUNTER — Other Ambulatory Visit: Payer: Self-pay

## 2018-12-09 VITALS — BP 129/89 | HR 84 | Temp 98.7°F | Resp 16 | Ht 65.0 in | Wt 262.4 lb

## 2018-12-09 DIAGNOSIS — R7401 Elevation of levels of liver transaminase levels: Secondary | ICD-10-CM

## 2018-12-09 DIAGNOSIS — D72829 Elevated white blood cell count, unspecified: Secondary | ICD-10-CM

## 2018-12-09 DIAGNOSIS — E118 Type 2 diabetes mellitus with unspecified complications: Secondary | ICD-10-CM | POA: Diagnosis not present

## 2018-12-09 DIAGNOSIS — M797 Fibromyalgia: Secondary | ICD-10-CM | POA: Diagnosis not present

## 2018-12-09 DIAGNOSIS — R740 Nonspecific elevation of levels of transaminase and lactic acid dehydrogenase [LDH]: Secondary | ICD-10-CM | POA: Diagnosis not present

## 2018-12-09 DIAGNOSIS — Z0000 Encounter for general adult medical examination without abnormal findings: Secondary | ICD-10-CM | POA: Diagnosis not present

## 2018-12-09 DIAGNOSIS — Z1239 Encounter for other screening for malignant neoplasm of breast: Secondary | ICD-10-CM | POA: Diagnosis not present

## 2018-12-09 DIAGNOSIS — Z1211 Encounter for screening for malignant neoplasm of colon: Secondary | ICD-10-CM

## 2018-12-09 DIAGNOSIS — M542 Cervicalgia: Secondary | ICD-10-CM | POA: Diagnosis not present

## 2018-12-09 DIAGNOSIS — Z23 Encounter for immunization: Secondary | ICD-10-CM | POA: Diagnosis not present

## 2018-12-09 LAB — COMPREHENSIVE METABOLIC PANEL
ALT: 123 U/L — ABNORMAL HIGH (ref 0–35)
AST: 68 U/L — ABNORMAL HIGH (ref 0–37)
Albumin: 4.4 g/dL (ref 3.5–5.2)
Alkaline Phosphatase: 40 U/L (ref 39–117)
BUN: 10 mg/dL (ref 6–23)
CO2: 27 mEq/L (ref 19–32)
Calcium: 10.1 mg/dL (ref 8.4–10.5)
Chloride: 101 mEq/L (ref 96–112)
Creatinine, Ser: 0.61 mg/dL (ref 0.40–1.20)
GFR: 102.85 mL/min (ref >60.00)
Glucose, Bld: 128 mg/dL — ABNORMAL HIGH (ref 70–99)
Potassium: 4.3 mEq/L (ref 3.5–5.1)
Sodium: 138 mEq/L (ref 135–145)
Total Bilirubin: 0.8 mg/dL (ref 0.2–1.2)
Total Protein: 7.2 g/dL (ref 6.0–8.3)

## 2018-12-09 LAB — CBC WITH DIFFERENTIAL/PLATELET
Basophils Absolute: 0.1 10*3/uL (ref 0.0–0.1)
Basophils Relative: 0.8 % (ref 0.0–3.0)
Eosinophils Absolute: 0.3 10*3/uL (ref 0.0–0.7)
Eosinophils Relative: 3 % (ref 0.0–5.0)
HCT: 40.3 % (ref 36.0–46.0)
Hemoglobin: 13.9 g/dL (ref 12.0–15.0)
Lymphocytes Relative: 38.1 % (ref 12.0–46.0)
Lymphs Abs: 3.5 10*3/uL (ref 0.7–4.0)
MCHC: 34.6 g/dL (ref 30.0–36.0)
MCV: 93.3 fl (ref 78.0–100.0)
Monocytes Absolute: 0.5 10*3/uL (ref 0.1–1.0)
Monocytes Relative: 5.6 % (ref 3.0–12.0)
Neutro Abs: 4.8 10*3/uL (ref 1.4–7.7)
Neutrophils Relative %: 52.5 % (ref 43.0–77.0)
Platelets: 268 10*3/uL (ref 150.0–400.0)
RBC: 4.32 Mil/uL (ref 3.87–5.11)
RDW: 12.9 % (ref 11.5–15.5)
WBC: 9.2 10*3/uL (ref 4.0–10.5)

## 2018-12-09 LAB — LIPID PANEL
Cholesterol: 115 mg/dL (ref 0–200)
HDL: 34.4 mg/dL — ABNORMAL LOW (ref >39.00)
LDL Cholesterol: 47 mg/dL (ref 0–99)
NonHDL: 80.47
Total CHOL/HDL Ratio: 3
Triglycerides: 165 mg/dL — ABNORMAL HIGH (ref 0.0–149.0)
VLDL: 33 mg/dL (ref 0.0–40.0)

## 2018-12-09 LAB — HEMOGLOBIN A1C: Hgb A1c MFr Bld: 7.9 % — ABNORMAL HIGH (ref 4.6–6.5)

## 2018-12-09 LAB — TSH: TSH: 2.37 u[IU]/mL (ref 0.35–4.50)

## 2018-12-09 MED ORDER — PREGABALIN 50 MG PO CAPS: 50.0000 mg | ORAL_CAPSULE | Freq: Two times a day (BID) | ORAL | 0 refills | Status: DC

## 2018-12-09 NOTE — Patient Instructions (Signed)
Health Maintenance, Female Adopting a healthy lifestyle and getting preventive care are important in promoting health and wellness. Ask your health care provider about:  The right schedule for you to have regular tests and exams.  Things you can do on your own to prevent diseases and keep yourself healthy. What should I know about diet, weight, and exercise? Eat a healthy diet   Eat a diet that includes plenty of vegetables, fruits, low-fat dairy products, and lean protein.  Do not eat a lot of foods that are high in solid fats, added sugars, or sodium. Maintain a healthy weight Body mass index (BMI) is used to identify weight problems. It estimates body fat based on height and weight. Your health care provider can help determine your BMI and help you achieve or maintain a healthy weight. Get regular exercise Get regular exercise. This is one of the most important things you can do for your health. Most adults should:  Exercise for at least 150 minutes each week. The exercise should increase your heart rate and make you sweat (moderate-intensity exercise).  Do strengthening exercises at least twice a week. This is in addition to the moderate-intensity exercise.  Spend less time sitting. Even light physical activity can be beneficial. Watch cholesterol and blood lipids Have your blood tested for lipids and cholesterol at 52 years of age, then have this test every 5 years. Have your cholesterol levels checked more often if:  Your lipid or cholesterol levels are high.  You are older than 52 years of age.  You are at high risk for heart disease. What should I know about cancer screening? Depending on your health history and family history, you may need to have cancer screening at various ages. This may include screening for:  Breast cancer.  Cervical cancer.  Colorectal cancer.  Skin cancer.  Lung cancer. What should I know about heart disease, diabetes, and high blood  pressure? Blood pressure and heart disease  High blood pressure causes heart disease and increases the risk of stroke. This is more likely to develop in people who have high blood pressure readings, are of African descent, or are overweight.  Have your blood pressure checked: ? Every 3-5 years if you are 18-39 years of age. ? Every year if you are 40 years old or older. Diabetes Have regular diabetes screenings. This checks your fasting blood sugar level. Have the screening done:  Once every three years after age 40 if you are at a normal weight and have a low risk for diabetes.  More often and at a younger age if you are overweight or have a high risk for diabetes. What should I know about preventing infection? Hepatitis B If you have a higher risk for hepatitis B, you should be screened for this virus. Talk with your health care provider to find out if you are at risk for hepatitis B infection. Hepatitis C Testing is recommended for:  Everyone born from 1945 through 1965.  Anyone with known risk factors for hepatitis C. Sexually transmitted infections (STIs)  Get screened for STIs, including gonorrhea and chlamydia, if: ? You are sexually active and are younger than 52 years of age. ? You are older than 52 years of age and your health care provider tells you that you are at risk for this type of infection. ? Your sexual activity has changed since you were last screened, and you are at increased risk for chlamydia or gonorrhea. Ask your health care provider if   you are at risk.  Ask your health care provider about whether you are at high risk for HIV. Your health care provider may recommend a prescription medicine to help prevent HIV infection. If you choose to take medicine to prevent HIV, you should first get tested for HIV. You should then be tested every 3 months for as long as you are taking the medicine. Pregnancy  If you are about to stop having your period (premenopausal) and  you may become pregnant, seek counseling before you get pregnant.  Take 400 to 800 micrograms (mcg) of folic acid every day if you become pregnant.  Ask for birth control (contraception) if you want to prevent pregnancy. Osteoporosis and menopause Osteoporosis is a disease in which the bones lose minerals and strength with aging. This can result in bone fractures. If you are 65 years old or older, or if you are at risk for osteoporosis and fractures, ask your health care provider if you should:  Be screened for bone loss.  Take a calcium or vitamin D supplement to lower your risk of fractures.  Be given hormone replacement therapy (HRT) to treat symptoms of menopause. Follow these instructions at home: Lifestyle  Do not use any products that contain nicotine or tobacco, such as cigarettes, e-cigarettes, and chewing tobacco. If you need help quitting, ask your health care provider.  Do not use street drugs.  Do not share needles.  Ask your health care provider for help if you need support or information about quitting drugs. Alcohol use  Do not drink alcohol if: ? Your health care provider tells you not to drink. ? You are pregnant, may be pregnant, or are planning to become pregnant.  If you drink alcohol: ? Limit how much you use to 0-1 drink a day. ? Limit intake if you are breastfeeding.  Be aware of how much alcohol is in your drink. In the [place] one drink equals one 12 oz bottle of beer (355 mL), one 5 oz glass of wine (148 mL), or one 1 oz glass of hard liquor (44 mL). General instructions  Schedule regular health, dental, and eye exams.  Stay current with your vaccines.  Tell your health care provider if: ? You often feel depressed. ? You have ever been abused or do not feel safe at home. Summary  Adopting a healthy lifestyle and getting preventive care are important in promoting health and wellness.  Follow your health care provider's instructions about healthy  diet, exercising, and getting tested or screened for diseases.  Follow your health care provider's instructions on monitoring your cholesterol and blood pressure. This information is not intended to replace advice given to you by your health care provider. Make sure you discuss any questions you have with your health care provider. Document Released: 09/12/2010 Document Revised: 02/20/2018 Document Reviewed: 02/20/2018 Elsevier Patient Education  2020 [employer]  

## 2018-12-09 NOTE — Progress Notes (Signed)
Office Note 12/09/2018  CC:  Chief Complaint  Patient presents with  . Annual Exam    fasting. wants flu shot. need to sched mamm, pap and colonoscopy   HPI:  [name] is a 53 y.o. White female who is here for annual health maintenance exam. She continues to abstain from smoking.  Pain in R neck from SCM insertion site extending across scapula.  Woke up with it one morning a few weeks ago. Mild and constant but worse with waking up in morning and with turning head far to the right. No injury prior.  No heat or ice.  She has been stretching.  Ibup sometimes taken.   Past Medical History:  Diagnosis Date  . ADD (attention deficit disorder with hyperactivity)   . Allergic rhinitis   . Atypical chest pain    Myocardial perf imaging 04/04/16: Low risk stress nuclear study with prior inferior infarct and mild peri-infarct ischemia; EF 55 with mild hypokinesis of the inferior wall.  Cath normal, so stress test was FALSE POS.  . Bowel obstruction ([place]) 06-25-2010   post mastectomy  . Breast cancer ([place]) 2012   left mastectomy and chemotherapy  . Cholelithiasis 03/2016   u/s: no signs of cholecystitis  . Depression   . GERD (gastroesophageal reflux disease)   . Hyperlipidemia   . Hypertension   . Mild persistent asthma    dx'd age 25 (Dr. Gwenette Greet) hx of noncompliance with controller therapy due to cost  . Morbid obesity ([place])   . NASH (nonalcoholic steatohepatitis) 02/2010   u/s confirmed 02/2010 (hx of transaminitis).  Repeat u/s 03/2016 showed no sign of cirrhosis or liver mass, just moderate hepatic steatosis. AST/ALT stable 05/2018.  Marland Kitchen Neuropathy   . OSA (obstructive sleep apnea)    Dr Gwenette Greet initially: pt couldn't tolerate CPAP.  Re-eval 12/2017 re-confirmed severe OSA, CPAP at 11 recommended.  . Ovarian mass    resected 07/12/10  . RLS (restless legs syndrome)   . Type II or unspecified type diabetes mellitus without mention of complication, uncontrolled 05/15/2012    Past  Surgical History:  Procedure Laterality Date  . BILATERAL SALPINGOOPHORECTOMY  07/12/2010   enlarged ovary  . CARDIOVASCULAR STRESS TEST  04/04/2016   Low risk stress nuclear study with prior inferior infarct and mild peri-infarct ischemia; EF 55% with mild hypokinesis of the inferior wall.  F/u cath was NORMAL, so this is suspected to be a false pos stress test (diaphragmatic attenuation)  . G 2 P 2    . LEFT HEART CATH AND CORONARY ANGIOGRAPHY N/A 04/20/2016   Normal coronaries.  EF 55-65%, normal LV function. (Stress test prior to this was false pos due to diaphragmatic attenuation). Procedure: Left Heart Cath and Coronary Angiography;  Surgeon: Peter M Martinique, MD;  Location: [facility];  Service: Cardiovascular;  Laterality: N/A;  . MASTECTOMY  06/21/2010   Left ; Dr [facility]    Family History  Adopted: Yes  Problem Relation Age of Onset  . Healthy Son        65  . Healthy Daughter        39    Social History   Socioeconomic History  . Marital status: Married    Spouse name: Not on file  . Number of children: 2  . Years of education: Not on file  . Highest education level: Not on file  Occupational History  . Occupation: Curator: artist [employer]  Social Needs  .  Financial resource strain: Not on file  . Food insecurity    Worry: Not on file    Inability: Not on file  . Transportation needs    Medical: Not on file    Non-medical: Not on file  Tobacco Use  . Smoking status: Former Smoker    Packs/day: 0.50    Years: 24.00    Pack years: 12.00    Types: Cigarettes    Quit date: 08/16/2018    Years since quitting: 0.3  . Smokeless tobacco: Never Used  Substance and Sexual Activity  . Alcohol use: No  . Drug use: No  . Sexual activity: Not on file  Lifestyle  . Physical activity    Days per week: Not on file    Minutes per session: Not on file  . Stress: Not on file  Relationships  . Social Herbalist on phone: Not on file     Gets together: Not on file    Attends religious service: Not on file    Active member of club or organization: Not on file    Attends meetings of clubs or organizations: Not on file    Relationship status: Not on file  . Intimate partner violence    Fear of current or ex partner: Not on file    Emotionally abused: Not on file    Physically abused: Not on file    Forced sexual activity: Not on file  Other Topics Concern  . Not on file  Social History Narrative   Not working as of 09/2017.  Previously worked as a Art therapist.   Lives with husband and two children.   2-3 caffeine drinks daily        Outpatient Medications Prior to Visit  Medication Sig Dispense Refill  . albuterol (PROVENTIL) (2.5 MG/3ML) 0.083% nebulizer solution Take 3 mLs (2.5 mg total) by nebulization every 6 (six) hours as needed for wheezing or shortness of breath. 75 mL 12  . albuterol (VENTOLIN HFA) 108 (90 Base) MCG/ACT inhaler INHALE 1 PUFF EVERY 6 HOURS AS NEEDED FOR SHORTNESS OF BREATH AND WHEEZING 8.5 g 0  . atorvastatin (LIPITOR) 40 MG tablet TAKE ONE TABLET BY MOUTH DAILY 90 tablet 1  . CALCIUM-VITAMIN D PO Take by mouth.    . cetirizine (ZYRTEC) 10 MG tablet Take 10 mg by mouth daily.      . citalopram (CELEXA) 40 MG tablet TAKE ONE TABLET BY MOUTH DAILY 90 tablet 1  . clonazePAM (KLONOPIN) 0.5 MG tablet TAKE 1-2 TABLETS BY MOUTH EVERY EVENING AT BEDTIME AS NEEDED FOR INSOMNIA 60 tablet 5  . dextroamphetamine (DEXEDRINE SPANSULE) 10 MG 24 hr capsule 2 caps po qAM 60 capsule 0  . Fluticasone-Salmeterol (ADVAIR DISKUS) 250-50 MCG/DOSE AEPB 1 inhalation bid 60 each 11  . gabapentin (NEURONTIN) 300 MG capsule TAKE 2 CAPSULES (600 MG TOTAL) BY MOUTH TWO TIMES A DAY 360 capsule 3  . ibuprofen (ADVIL,MOTRIN) 200 MG tablet Take 800 mg by mouth as needed for fever or moderate pain.     . Insulin Pen Needle (BD PEN NEEDLE NANO U/F) 32G X 4 MM MISC Use with Victoza as directed once daily 100 each 1  .  liraglutide (VICTOZA) 18 MG/3ML SOPN INJECT 1.2 MG SUBCUTANEOUSLY DAILY 6 mL 11  . magnesium oxide (MAG-OX) 400 MG tablet Take 400 mg by mouth daily.    . Multiple Vitamin (MULTIVITAMIN) tablet Take 1 tablet by mouth daily.    Marland Kitchen rOPINIRole (REQUIP) 0.5  MG tablet TAKE ONE TABLET BY MOUTH DAILY 90 tablet 2  . triamterene-hydrochlorothiazide (MAXZIDE-25) 37.5-25 MG tablet TAKE 1/2 TABLET BY MOUTH DAILY 45 tablet 0  . verapamil (CALAN) 120 MG tablet TAKE ONE-HALF TABLET BY MOUTH TWO TIMES A DAY 90 tablet 0   No facility-administered medications prior to visit.     Allergies  Allergen Reactions  . Metoprolol Other (See Comments)    REACTION: " chest pain"  . Sulfonamide Derivatives     nausea    ROS Review of Systems  Constitutional: Negative for appetite change, chills, fatigue and fever.  HENT: Negative for congestion, dental problem, ear pain and sore throat.   Eyes: Negative for discharge, redness and visual disturbance.  Respiratory: Negative for cough, chest tightness, shortness of breath and wheezing.   Cardiovascular: Negative for chest pain, palpitations and leg swelling.  Gastrointestinal: Negative for abdominal pain, blood in stool, diarrhea, nausea and vomiting.  Genitourinary: Negative for difficulty urinating, dysuria, flank pain, frequency, hematuria and urgency.  Musculoskeletal: Positive for arthralgias (chronic, diffuse) and myalgias (chronic, diffuse). Negative for back pain, joint swelling and neck stiffness.  Skin: Negative for pallor and rash.  Neurological: Negative for dizziness, speech difficulty, weakness and headaches.  Hematological: Negative for adenopathy. Does not bruise/bleed easily.  Psychiatric/Behavioral: Positive for decreased concentration. Negative for confusion and sleep disturbance. The patient is not nervous/anxious.     PE; Blood pressure 129/89, pulse 84, temperature 98.7 F (37.1 C), temperature source Temporal, resp. rate 16, height 5' 5"   (1.651 m), weight 262 lb 6 oz (119 kg), last menstrual period 07/14/2010, SpO2 95 %. Body mass index is 43.66 kg/m. Pt examined with Jacklynn Ganong, CMA, as chaperone.  Gen: Alert, well appearing.  Patient is oriented to person, place, time, and situation. AFFECT: pleasant, lucid thought and speech. ENT: Ears: EACs clear, normal epithelium.  TMs with good light reflex and landmarks bilaterally.  Eyes: no injection, icteris, swelling, or exudate.  EOMI, PERRLA. Nose: no drainage or turbinate edema/swelling.  No injection or focal lesion.  Mouth: lips without lesion/swelling.  Oral mucosa pink and moist.  Dentition intact and without obvious caries or gingival swelling.  Oropharynx without erythema, exudate, or swelling.  Neck: supple/nontender.  No LAD, mass, or TM.  Carotid pulses 2+ bilaterally, without bruits. ROM intact but some mild pain with lateral bending and with rotating to the right.  No signif soft tissue or bony tenderness about the skull, spine, or shoulder girdle.  UE strength intact, DTRs intact and symmetric. CV: RRR, no m/r/g.   LUNGS: CTA bilat, nonlabored resps, good aeration in all lung fields. ABD: soft, NT, ND, BS normal.  No hepatospenomegaly or mass.  No bruits. EXT: no clubbing, cyanosis, or edema.  Musculoskeletal: no joint swelling, erythema, warmth, or tenderness.  ROM of all joints intact. Skin - no sores or suspicious lesions or rashes or color changes   Pertinent labs:  Lab Results  Component Value Date   TSH 1.72 10/03/2017   Lab Results  Component Value Date   WBC 16.7 (H) 11/07/2018   HGB 14.4 11/07/2018   HCT 42.0 11/07/2018   MCV 92.5 11/07/2018   PLT 313 11/07/2018   Lab Results  Component Value Date   CREATININE 0.63 11/07/2018   BUN 19 11/07/2018   NA 136 11/07/2018   K 4.0 11/07/2018   CL 100 11/07/2018   CO2 23 11/07/2018   Lab Results  Component Value Date   ALT 94 (H) 11/07/2018   AST  47 (H) 11/07/2018   ALKPHOS 43 11/07/2018    BILITOT 0.5 11/07/2018   Lab Results  Component Value Date   CHOL 124 10/03/2017   Lab Results  Component Value Date   HDL 30.00 (L) 10/03/2017   Lab Results  Component Value Date   LDLCALC 44 04/12/2016   Lab Results  Component Value Date   TRIG 229.0 (H) 10/03/2017   Lab Results  Component Value Date   CHOLHDL 4 10/03/2017   Lab Results  Component Value Date   HGBA1C 6.7 (A) 09/06/2018   HGBA1C 6.7 09/06/2018   HGBA1C 6.7 (A) 09/06/2018   HGBA1C 6.7 09/06/2018    ASSESSMENT AND PLAN:    1) Neck soft tissue pain: discussed heat, continued stretching, bed pillow.  2) Fibromyalgia dx discussed (see my most recent office note).  We decided to ween off gabapentin today and start low dose lyrica.   Gaba: change to 300 mg bid x 10d, then 300 mg qd x 10, then stop. At the same time, start lyrica 50 mg bid. Therapeutic expectations and side effect profile of medication discussed today.  Patient's questions answered.   3) Health maintenance exam: Reviewed age and gender appropriate health maintenance issues (prudent diet, regular exercise, health risks of tobacco and excessive alcohol, use of seatbelts, fire alarms in home, use of sunscreen).  Also reviewed age and gender appropriate health screening as well as vaccine recommendations. Vaccines: Flu->given today. Labs: fasting HP and HbA1c. Cervical ca screening:  Her last pap was 09/2015 and was normal.  As per latest guidelines her next PAP is due 09/2020. Breast ca screening: left mastectomy for breast ca 06/2010:-> her only mammogram since then was 2014 and was normal.  Repeat ordered today. Colon ca screening: has never had colon ca screening-->referred to GI today.  An After Visit Summary was printed and given to the patient.  FOLLOW UP:  Return for f/u fibro/meds.  Signed:  Crissie Sickles, MD           12/09/2018

## 2018-12-30 ENCOUNTER — Ambulatory Visit: Payer: Managed Care, Other (non HMO) | Admitting: Family Medicine

## 2018-12-30 ENCOUNTER — Other Ambulatory Visit: Payer: Self-pay | Admitting: Family Medicine

## 2018-12-30 MED ORDER — DEXTROAMPHETAMINE SULFATE ER 10 MG PO CP24: ORAL_CAPSULE | ORAL | 0 refills | Status: DC

## 2018-12-30 NOTE — Telephone Encounter (Signed)
Patient is requesting Rx dextroamphetamine (DEXEDRINE SPANSULE) 10 MG 24 hr capsule to be sent to [facility]

## 2018-12-30 NOTE — Telephone Encounter (Signed)
Requesting: Dextroamphetamine Contract:09/06/18 UDS:09/06/18 Last Visit:11/2818 Next Visit:01/02/19 Last Refill:09/06/18(60,0)  Please Advise. Medication pending

## 2019-01-02 ENCOUNTER — Ambulatory Visit (INDEPENDENT_AMBULATORY_CARE_PROVIDER_SITE_OTHER): Payer: Managed Care, Other (non HMO) | Admitting: Family Medicine

## 2019-01-02 ENCOUNTER — Other Ambulatory Visit: Payer: Self-pay

## 2019-01-02 ENCOUNTER — Encounter: Payer: Self-pay | Admitting: Family Medicine

## 2019-01-02 DIAGNOSIS — M797 Fibromyalgia: Secondary | ICD-10-CM | POA: Diagnosis not present

## 2019-01-02 DIAGNOSIS — J4550 Severe persistent asthma, uncomplicated: Secondary | ICD-10-CM | POA: Diagnosis not present

## 2019-01-02 MED ORDER — FLUTICASONE-SALMETEROL 500-50 MCG/DOSE IN AEPB: 1.0000 | INHALATION_SPRAY | Freq: Two times a day (BID) | RESPIRATORY_TRACT | 6 refills | Status: DC

## 2019-01-02 MED ORDER — ALBUTEROL SULFATE HFA 108 (90 BASE) MCG/ACT IN AERS: INHALATION_SPRAY | RESPIRATORY_TRACT | 1 refills | Status: DC

## 2019-01-02 MED ORDER — PREGABALIN 50 MG PO CAPS: ORAL_CAPSULE | ORAL | 0 refills | Status: DC

## 2019-01-02 NOTE — Progress Notes (Signed)
Virtual Visit via Video Note  I connected with pt on 01/02/19 at  4:00 PM EDT by a video enabled telemedicine application and verified that I am speaking with the correct person using two identifiers.  Location patient: home Location provider:work or home office Persons participating in the virtual visit: patient, provider  I discussed the limitations of evaluation and management by telemedicine and the availability of in person appointments. The patient expressed understanding and agreed to proceed.  Telemedicine visit is a necessity given the COVID-19 restrictions in place at the current time.  HPI: 53 y/o WF being seen today for 3 wk f/u fibromyalgia. A/P as of last visit: "Fibromyalgia dx discussed (see my most recent office note).  We decided to ween off gabapentin today and start low dose lyrica.   Gaba: change to 300 mg bid x 10d, then 300 mg qd x 10, then stop. At the same time, start lyrica 50 mg bid."  Interim hx: She did well getting off gabapentin and starting lyrica->sx's stable. She is ok with the idea of increasing lyrica slowly.  Still having SOB/DOE, wheezing, feeling of decreased aeration with trying to exhale. Sx's intermittent and occurring still on/off throughout each day, seem to respond somewhat to albuterol but often uses 4 puffs.  She is taking advair 250/50 1 p bid consistently.   Minimal cough and wheeze.  No CP, no fevers, no URI sx's.  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  It has not been long ago that we did extended course of systemic steroids but she seems only marginally improved.  Denies LE swelling, dizziness, or PND.     ROS:  no HAs, no rashes, no melena/hematochezia.  No polyuria or polydipsia.    Past Medical History:  Diagnosis Date  . ADD (attention deficit disorder with hyperactivity)   . Allergic rhinitis   . Atypical chest pain    Myocardial perf imaging 04/04/16:  Low risk stress nuclear study with prior inferior infarct and mild peri-infarct ischemia; EF 55 with mild hypokinesis of the inferior wall.  Cath normal, so stress test was FALSE POS.  . Bowel obstruction ([place]) 06-25-2010   post mastectomy  . Breast cancer ([place]) 2012   left mastectomy and chemotherapy  . Cholelithiasis 03/2016   u/s: no signs of cholecystitis  . Depression   . Fibromyalgia syndrome   . GERD (gastroesophageal reflux disease)   . Hyperlipidemia   . Hypertension   . Mild persistent asthma    dx'd age 55 (Dr. Gwenette Greet) hx of noncompliance with controller therapy due to cost  . Morbid obesity ([place])   . NASH (nonalcoholic steatohepatitis) 02/2010   u/s confirmed 02/2010 (hx of transaminitis).  Repeat u/s 03/2016 showed no sign of cirrhosis or liver mass, just moderate hepatic steatosis. AST/ALT stable 05/2018.  Marland Kitchen Neuropathy   . OSA (obstructive sleep apnea)    Dr Gwenette Greet initially: pt couldn't tolerate CPAP.  Re-eval 12/2017 re-confirmed severe OSA, CPAP at 11 recommended.  . Ovarian mass    resected 07/12/10  . RLS (restless legs syndrome)   . Type II or unspecified type diabetes mellitus without mention of complication, uncontrolled 05/15/2012    Past Surgical History:  Procedure Laterality Date  . BILATERAL SALPINGOOPHORECTOMY  07/12/2010   enlarged ovary  . CARDIOVASCULAR STRESS TEST  04/04/2016   Low risk stress nuclear study with prior inferior infarct and mild peri-infarct ischemia; EF 55% with mild hypokinesis  of the inferior wall.  F/u cath was NORMAL, so this is suspected to be a false pos stress test (diaphragmatic attenuation)  . G 2 P 2    . LEFT HEART CATH AND CORONARY ANGIOGRAPHY N/A 04/20/2016   Normal coronaries.  EF 55-65%, normal LV function. (Stress test prior to this was false pos due to diaphragmatic attenuation). Procedure: Left Heart Cath and Coronary Angiography;  Surgeon: Peter M Martinique, MD;  Location: [facility];  Service: Cardiovascular;  Laterality:  N/A;  . MASTECTOMY  06/21/2010   Left ; Dr [facility]    Family History  Adopted: Yes  Problem Relation Age of Onset  . Healthy Son        29  . Healthy Daughter        41     Current Outpatient Medications:  .  albuterol (PROVENTIL) (2.5 MG/3ML) 0.083% nebulizer solution, Take 3 mLs (2.5 mg total) by nebulization every 6 (six) hours as needed for wheezing or shortness of breath., Disp: 75 mL, Rfl: 12 .  albuterol (VENTOLIN HFA) 108 (90 Base) MCG/ACT inhaler, INHALE 1 PUFF EVERY 6 HOURS AS NEEDED FOR SHORTNESS OF BREATH AND WHEEZING, Disp: 8.5 g, Rfl: 0 .  atorvastatin (LIPITOR) 40 MG tablet, TAKE ONE TABLET BY MOUTH DAILY, Disp: 90 tablet, Rfl: 1 .  CALCIUM-VITAMIN D PO, Take by mouth., Disp: , Rfl:  .  cetirizine (ZYRTEC) 10 MG tablet, Take 10 mg by mouth daily.  , Disp: , Rfl:  .  citalopram (CELEXA) 40 MG tablet, TAKE ONE TABLET BY MOUTH DAILY, Disp: 90 tablet, Rfl: 1 .  clonazePAM (KLONOPIN) 0.5 MG tablet, TAKE 1-2 TABLETS BY MOUTH EVERY EVENING AT BEDTIME AS NEEDED FOR INSOMNIA, Disp: 60 tablet, Rfl: 5 .  dextroamphetamine (DEXEDRINE SPANSULE) 10 MG 24 hr capsule, 2 caps po qAM, Disp: 60 capsule, Rfl: 0 .  Fluticasone-Salmeterol (ADVAIR DISKUS) 250-50 MCG/DOSE AEPB, 1 inhalation bid, Disp: 60 each, Rfl: 11 .  ibuprofen (ADVIL,MOTRIN) 200 MG tablet, Take 800 mg by mouth as needed for fever or moderate pain. , Disp: , Rfl:  .  Insulin Pen Needle (BD PEN NEEDLE NANO U/F) 32G X 4 MM MISC, Use with Victoza as directed once daily, Disp: 100 each, Rfl: 1 .  liraglutide (VICTOZA) 18 MG/3ML SOPN, INJECT 1.2 MG SUBCUTANEOUSLY DAILY, Disp: 6 mL, Rfl: 11 .  magnesium oxide (MAG-OX) 400 MG tablet, Take 400 mg by mouth daily., Disp: , Rfl:  .  Multiple Vitamin (MULTIVITAMIN) tablet, Take 1 tablet by mouth daily., Disp: , Rfl:  .  pregabalin (LYRICA) 50 MG capsule, Take 1 capsule (50 mg total) by mouth 2 (two) times daily., Disp: 60 capsule, Rfl: 0 .  rOPINIRole (REQUIP) 0.5 MG tablet,  TAKE ONE TABLET BY MOUTH DAILY, Disp: 90 tablet, Rfl: 2 .  triamterene-hydrochlorothiazide (MAXZIDE-25) 37.5-25 MG tablet, TAKE 1/2 TABLET BY MOUTH DAILY, Disp: 45 tablet, Rfl: 0 .  verapamil (CALAN) 120 MG tablet, TAKE ONE-HALF TABLET BY MOUTH TWO TIMES A DAY, Disp: 90 tablet, Rfl: 0 .  gabapentin (NEURONTIN) 300 MG capsule, TAKE 2 CAPSULES (600 MG TOTAL) BY MOUTH TWO TIMES A DAY (Patient not taking: Reported on 01/02/2019), Disp: 360 capsule, Rfl: 3  EXAM:  VITALS per patient if applicable: LMP 41/96/2229 Comment: ovaries removed 2012   GENERAL: alert, oriented, appears well and in no acute distress  HEENT: atraumatic, conjunttiva clear, no obvious abnormalities on inspection of external nose and ears  NECK: normal movements of the head and neck  LUNGS: on inspection no signs of respiratory distress, breathing rate appears normal, no obvious gross SOB, gasping or wheezing  CV: no obvious cyanosis  MS: moves all visible extremities without noticeable abnormality  PSYCH/NEURO: pleasant and cooperative, no obvious depression or anxiety, speech and thought processing grossly intact  LABS: none today    Chemistry      Component Value Date/Time   NA 138 12/09/2018 1104   NA 141 01/15/2015 0911   K 4.3 12/09/2018 1104   K 3.6 01/15/2015 0911   CL 101 12/09/2018 1104   CO2 27 12/09/2018 1104   CO2 27 01/15/2015 0911   BUN 10 12/09/2018 1104   BUN 17.2 01/15/2015 0911   CREATININE 0.61 12/09/2018 1104   CREATININE 0.67 04/12/2016 1311   CREATININE 0.8 01/15/2015 0911      Component Value Date/Time   CALCIUM 10.1 12/09/2018 1104   CALCIUM 10.7 (H) 01/15/2015 0911   ALKPHOS 40 12/09/2018 1104   ALKPHOS 42 01/15/2015 0911   AST 68 (H) 12/09/2018 1104   AST 64 (H) 01/15/2015 0911   ALT 123 (H) 12/09/2018 1104   ALT 153 (H) 01/15/2015 0911   BILITOT 0.8 12/09/2018 1104   BILITOT 0.57 01/15/2015 0911     Lab Results  Component Value Date   HGBA1C 7.9 (H) 12/09/2018   Lab  Results  Component Value Date   TSH 2.37 12/09/2018   Lab Results  Component Value Date   CHOL 115 12/09/2018   HDL 34.40 (L) 12/09/2018   LDLCALC 47 12/09/2018   LDLDIRECT 70.0 10/03/2017   TRIG 165.0 (H) 12/09/2018   CHOLHDL 3 12/09/2018   Lab Results  Component Value Date   WBC 9.2 12/09/2018   HGB 13.9 12/09/2018   HCT 40.3 12/09/2018   MCV 93.3 12/09/2018   PLT 268.0 12/09/2018   Lab Results  Component Value Date   HGBA1C 7.9 (H) 12/09/2018    ASSESSMENT AND PLAN:  Discussed the following assessment and plan:  1) Fibromyalgia: stable but not well controlled.   Will continue gradual increase of lyrica: 141m bid x 15d, then 150 mg bid x 15d then I'll see her for f/u in office.  2) Mod to severe persistent asthma. Increase advair to 500/50, 1 p bid. Continue albut HFA 2 p q6h prn. PFTs ordered. Signs/symptoms to call or return for were reviewed and pt expressed understanding.  I discussed the assessment and treatment plan with the patient. The patient was provided an opportunity to ask questions and all were answered. The patient agreed with the plan and demonstrated an understanding of the instructions.   The patient was advised to call back or seek an in-person evaluation if the symptoms worsen or if the condition fails to improve as anticipated.  F/u: 1 mo  Signed:  PCrissie Sickles MD           01/02/2019

## 2019-01-04 ENCOUNTER — Other Ambulatory Visit: Payer: Self-pay | Admitting: Family Medicine

## 2019-01-06 ENCOUNTER — Ambulatory Visit: Payer: Managed Care, Other (non HMO) | Admitting: Family Medicine

## 2019-01-09 ENCOUNTER — Encounter: Payer: Self-pay | Admitting: Family Medicine

## 2019-01-14 ENCOUNTER — Encounter: Payer: Self-pay | Admitting: Gastroenterology

## 2019-01-16 ENCOUNTER — Encounter: Payer: Self-pay | Admitting: Family Medicine

## 2019-01-16 MED ORDER — PREDNISONE 20 MG PO TABS: ORAL_TABLET | ORAL | 0 refills | Status: DC

## 2019-01-16 MED ORDER — BUDESONIDE-FORMOTEROL FUMARATE 160-4.5 MCG/ACT IN AERO: 2.0000 | INHALATION_SPRAY | Freq: Two times a day (BID) | RESPIRATORY_TRACT | 5 refills | Status: DC

## 2019-01-16 NOTE — Telephone Encounter (Signed)
Please advise recommendations or if you would like patient to come in before appointment in 3 weeks

## 2019-01-16 NOTE — Telephone Encounter (Signed)
Called patient and advised new recommendations. Made 1 month follow up. Patient will follow up sooner if needed. Patient will call to schedule her PFT

## 2019-01-16 NOTE — Telephone Encounter (Signed)
Stop Advair. I rx'd symbicort inhaler to replace advair. I also eRx'd prednisone for her to take for the next 10 days. Has she been contacted about scheduling pulmonary function testing yet? Keep f/u as-is for now, but if not feeling significant improvement in 4-5 days then make in-office f/u appt.-thx

## 2019-01-25 ENCOUNTER — Other Ambulatory Visit: Payer: Self-pay | Admitting: Family Medicine

## 2019-01-28 ENCOUNTER — Encounter (HOSPITAL_COMMUNITY): Payer: Managed Care, Other (non HMO)

## 2019-01-29 ENCOUNTER — Other Ambulatory Visit: Payer: Self-pay

## 2019-01-30 ENCOUNTER — Other Ambulatory Visit: Payer: Self-pay

## 2019-01-30 ENCOUNTER — Ambulatory Visit: Payer: Managed Care, Other (non HMO) | Admitting: Family Medicine

## 2019-01-30 ENCOUNTER — Encounter: Payer: Self-pay | Admitting: Family Medicine

## 2019-01-30 VITALS — BP 150/88 | HR 77 | Temp 98.3°F | Resp 16 | Ht 65.0 in | Wt 255.6 lb

## 2019-01-30 DIAGNOSIS — I10 Essential (primary) hypertension: Secondary | ICD-10-CM

## 2019-01-30 DIAGNOSIS — R0602 Shortness of breath: Secondary | ICD-10-CM

## 2019-01-30 DIAGNOSIS — J4541 Moderate persistent asthma with (acute) exacerbation: Secondary | ICD-10-CM | POA: Diagnosis not present

## 2019-01-30 DIAGNOSIS — R002 Palpitations: Secondary | ICD-10-CM

## 2019-01-30 DIAGNOSIS — M797 Fibromyalgia: Secondary | ICD-10-CM | POA: Diagnosis not present

## 2019-01-30 DIAGNOSIS — R0600 Dyspnea, unspecified: Secondary | ICD-10-CM

## 2019-01-30 DIAGNOSIS — R0609 Other forms of dyspnea: Secondary | ICD-10-CM

## 2019-01-30 MED ORDER — VERAPAMIL HCL 120 MG PO TABS: ORAL_TABLET | ORAL | 0 refills | Status: DC

## 2019-01-30 NOTE — Progress Notes (Signed)
OFFICE VISIT  01/30/2019   CC:  Chief Complaint  Patient presents with  . Follow-up    fibromylagia, asthma   HPI:    Patient is a 53 y.o. Caucasian female who presents for 1 mo f/u fibromyalgia syndrome and persistent asthma with recent prolonged exacerbation.  I saw her 01/02/19 and she had asthma exac/poor control of asthma so I increased her advair and ordered PFTs. Pt called back stating her asthma was no better and the advair was causing hoarseness. My MYCHART message from 01/16/19: "Stop Advair. I rx'd symbicort inhaler to replace advair. I also eRx'd prednisone for her to take for the next 10 days. Has she been contacted about scheduling pulmonary function testing yet? Keep f/u as-is for now, but if not feeling significant improvement in 4-5 days then make in-office f/u appt".  Fibromyalgia: Has been on lyrica about 6-7 wks now.  She just increased dose from 100bid to 150 bid and she still feels like it is helping and she has no side effects.  Asthma:  Sx's have been chest tightness, difficulty getting air out well, some SOB at night and DOE all day.   No fevers.  No dizziness or CP.  Palpitations more lately.  No wheezing.   She feels like her hoarseness is improving. She does not monitor her bp or HR. She has felt gradual improvement over the last 2 wks, with the most improvement noted the last 2 d or so.  She did finish the prednisone.  She weened down her albuterol, also stopped her symbicort the last couple of days.   Past Medical History:  Diagnosis Date  . ADD (attention deficit disorder with hyperactivity)   . Allergic rhinitis   . Atypical chest pain    Myocardial perf imaging 04/04/16: Low risk stress nuclear study with prior inferior infarct and mild peri-infarct ischemia; EF 55 with mild hypokinesis of the inferior wall.  Cath normal, so stress test was FALSE POS.  . Bowel obstruction ([place]) 06-25-2010   post mastectomy  . Breast cancer ([place]) 2012   left  mastectomy and chemotherapy  . Cholelithiasis 03/2016   u/s: no signs of cholecystitis  . Depression   . Fibromyalgia syndrome   . GERD (gastroesophageal reflux disease)   . Hyperlipidemia   . Hypertension   . Mild persistent asthma    dx'd age 52 (Dr. Gwenette Greet) hx of noncompliance with controller therapy due to cost  . Morbid obesity ([place])   . NASH (nonalcoholic steatohepatitis) 02/2010   u/s confirmed 02/2010 (hx of transaminitis).  Repeat u/s 03/2016 showed no sign of cirrhosis or liver mass, just moderate hepatic steatosis. AST/ALT stable 05/2018.  Marland Kitchen Neuropathy   . OSA (obstructive sleep apnea)    Dr Gwenette Greet initially: pt couldn't tolerate CPAP.  Re-eval 12/2017 re-confirmed severe OSA, CPAP at 11 recommended.  . Ovarian mass    resected 07/12/10  . RLS (restless legs syndrome)   . Type II or unspecified type diabetes mellitus without mention of complication, uncontrolled 05/15/2012    Past Surgical History:  Procedure Laterality Date  . BILATERAL SALPINGOOPHORECTOMY  07/12/2010   enlarged ovary  . CARDIOVASCULAR STRESS TEST  04/04/2016   Low risk stress nuclear study with prior inferior infarct and mild peri-infarct ischemia; EF 55% with mild hypokinesis of the inferior wall.  F/u cath was NORMAL, so this is suspected to be a false pos stress test (diaphragmatic attenuation)  . G 2 P 2    . LEFT HEART CATH AND  CORONARY ANGIOGRAPHY N/A 04/20/2016   Normal coronaries.  EF 55-65%, normal LV function. (Stress test prior to this was false pos due to diaphragmatic attenuation). Procedure: Left Heart Cath and Coronary Angiography;  Surgeon: Peter M Martinique, MD;  Location: [place] [facility];  Service: Cardiovascular;  Laterality: N/A;  . MASTECTOMY  06/21/2010   Left ; Dr [facility]    MEDS: she is no longer taking gabapentin or prednisone listed below. Outpatient Medications Prior to Visit  Medication Sig Dispense Refill  . albuterol (PROVENTIL) (2.5 MG/3ML) 0.083% nebulizer solution  Take 3 mLs (2.5 mg total) by nebulization every 6 (six) hours as needed for wheezing or shortness of breath. 75 mL 12  . albuterol (VENTOLIN HFA) 108 (90 Base) MCG/ACT inhaler INHALE 1 PUFF EVERY 6 HOURS AS NEEDED FOR SHORTNESS OF BREATH AND WHEEZING 8.5 g 1  . atorvastatin (LIPITOR) 40 MG tablet TAKE ONE TABLET BY MOUTH DAILY 90 tablet 1  . budesonide-formoterol (SYMBICORT) 160-4.5 MCG/ACT inhaler Inhale 2 puffs into the lungs 2 (two) times daily. 1 Inhaler 5  . CALCIUM-VITAMIN D PO Take by mouth.    . cetirizine (ZYRTEC) 10 MG tablet Take 10 mg by mouth daily.      . citalopram (CELEXA) 40 MG tablet TAKE ONE TABLET BY MOUTH DAILY 90 tablet 1  . clonazePAM (KLONOPIN) 0.5 MG tablet TAKE 1-2 TABLETS BY MOUTH EVERY EVENING AT BEDTIME AS NEEDED FOR INSOMNIA 60 tablet 5  . dextroamphetamine (DEXEDRINE SPANSULE) 10 MG 24 hr capsule 2 caps po qAM 60 capsule 0  . ibuprofen (ADVIL,MOTRIN) 200 MG tablet Take 800 mg by mouth as needed for fever or moderate pain.     . Insulin Pen Needle (BD PEN NEEDLE NANO U/F) 32G X 4 MM MISC Use with Victoza as directed once daily 100 each 1  . liraglutide (VICTOZA) 18 MG/3ML SOPN INJECT 1.2 MG SUBCUTANEOUSLY DAILY 6 mL 11  . magnesium oxide (MAG-OX) 400 MG tablet Take 400 mg by mouth daily.    . Multiple Vitamin (MULTIVITAMIN) tablet Take 1 tablet by mouth daily.    . pregabalin (LYRICA) 50 MG capsule 2 caps po bid x 15d, then increase to 3 caps po bid 150 capsule 0  . rOPINIRole (REQUIP) 0.5 MG tablet TAKE ONE TABLET BY MOUTH DAILY 90 tablet 2  . triamterene-hydrochlorothiazide (MAXZIDE-25) 37.5-25 MG tablet TAKE 1/2 TABLET BY MOUTH DAILY 45 tablet 0  . gabapentin (NEURONTIN) 300 MG capsule TAKE 2 CAPSULES (600 MG TOTAL) BY MOUTH TWO TIMES A DAY 360 capsule 3  . verapamil (CALAN) 120 MG tablet TAKE 1/2 TABLET BY MOUTH TWICE A DAY 90 tablet 0  . predniSONE (DELTASONE) 20 MG tablet 2 tabs po qd x 5d, then 1 tab po qd x 5d 15 tablet 0   No facility-administered  medications prior to visit.     Allergies  Allergen Reactions  . Metoprolol Other (See Comments)    REACTION: " chest pain"  . Sulfonamide Derivatives     nausea    ROS As per HPI  PE: Blood pressure (!) 150/88, pulse 77, temperature 98.3 F (36.8 C), temperature source Temporal, resp. rate 16, height 5' 5"  (1.651 m), weight 255 lb 9.6 oz (115.9 kg), last menstrual period 07/14/2010, SpO2 92 %.RA. Repeat RA sat 10 min after in room was 98%.  RA sat while/after ambulating in office was 93%. Gen: Alert, well appearing.  Patient is oriented to person, place, time, and situation. AFFECT: pleasant, lucid thought and speech. YYT:KPTW:  no injection, icteris, swelling, or exudate.  EOMI, PERRLA. Mouth: lips without lesion/swelling.  Oral mucosa pink and moist. Oropharynx without erythema, exudate, or swelling.  CV: RRR, no m/r/g.   LUNGS: CTA bilat, nonlabored resps, good aeration in all lung fields. EXT: no clubbing or cyanosis.  no edema, focal swelling, or tenderness.   Peak flows: 320/350/340 (normal for age/height  Is 350) LABS:  Lab Results  Component Value Date   TSH 2.37 12/09/2018   Lab Results  Component Value Date   WBC 9.2 12/09/2018   HGB 13.9 12/09/2018   HCT 40.3 12/09/2018   MCV 93.3 12/09/2018   PLT 268.0 12/09/2018   Lab Results  Component Value Date   CREATININE 0.61 12/09/2018   BUN 10 12/09/2018   NA 138 12/09/2018   K 4.3 12/09/2018   CL 101 12/09/2018   CO2 27 12/09/2018   Lab Results  Component Value Date   ALT 123 (H) 12/09/2018   AST 68 (H) 12/09/2018   ALKPHOS 40 12/09/2018   BILITOT 0.8 12/09/2018   Lab Results  Component Value Date   CHOL 115 12/09/2018   Lab Results  Component Value Date   HDL 34.40 (L) 12/09/2018   Lab Results  Component Value Date   LDLCALC 47 12/09/2018   Lab Results  Component Value Date   TRIG 165.0 (H) 12/09/2018   Lab Results  Component Value Date   CHOLHDL 3 12/09/2018   Lab Results  Component  Value Date   HGBA1C 7.9 (H) 12/09/2018    IMPRESSION AND PLAN:  1) Fibromyalgia syndrome: improved. Continue lyrica 185m bid and if improvement has leveled out and she still has no side effects in 6 wks then we'll push the dose up some.  2) HTN, palpitations: increase verapamil to a whole 1261mtab bid.  3) Asthma: I still feel like she is a non-wheezing asthmatic.  Low suspicion of alternate pulm dx or cardiac dx causing her sx's at this time. Peak flows almost normal here today and this is compatible with the way she feels the last 2d. Oxygen normal at rest, just drops to 93% with ambulation. I think she has some obesity hypoventilation syndrome complicating her asthma. Keep plan for PFTs next week.  Restart symbicort.  4) Hoarse voice: I thought this was just after use of the advair recently but she seems to feel like it is more of a chronic, waxing and waning problem.  She has some upper airway wheeze today with doing the forced expiratory maneuver today. When breathing issues better then may pursue ENT eval if this problem persists.  An After Visit Summary was printed and given to the patient.  FOLLOW UP: Return in about 6 weeks (around 03/13/2019) for routine chronic illness f/u.  Signed:  PhCrissie SicklesMD           01/30/2019

## 2019-02-01 ENCOUNTER — Other Ambulatory Visit (HOSPITAL_COMMUNITY)
Admission: RE | Admit: 2019-02-01 | Discharge: 2019-02-01 | Disposition: A | Discharge status: Home / Self Care | Payer: Managed Care, Other (non HMO) | Source: Ambulatory Visit | Attending: Family Medicine | Admitting: Family Medicine

## 2019-02-01 DIAGNOSIS — Z01812 Encounter for preprocedural laboratory examination: Secondary | ICD-10-CM | POA: Diagnosis not present

## 2019-02-01 DIAGNOSIS — Z20828 Contact with and (suspected) exposure to other viral communicable diseases: Secondary | ICD-10-CM | POA: Diagnosis not present

## 2019-02-02 ENCOUNTER — Other Ambulatory Visit: Payer: Self-pay | Admitting: Family Medicine

## 2019-02-03 ENCOUNTER — Other Ambulatory Visit: Payer: Self-pay

## 2019-02-03 LAB — NOVEL CORONAVIRUS, NAA (HOSP ORDER, SEND-OUT TO REF LAB; TAT 18-24 HRS): SARS-CoV-2, NAA: NOT DETECTED

## 2019-02-03 NOTE — Telephone Encounter (Signed)
RF request for Lyrica LOV: 01/30/19 Next ov: 03/03/19 Last written:01/02/19(150,0)   Rx printed in error. Medication pending, please advise

## 2019-02-04 ENCOUNTER — Ambulatory Visit (AMBULATORY_SURGERY_CENTER): Payer: Managed Care, Other (non HMO) | Admitting: *Deleted

## 2019-02-04 ENCOUNTER — Other Ambulatory Visit: Payer: Self-pay

## 2019-02-04 ENCOUNTER — Ambulatory Visit (HOSPITAL_COMMUNITY)
Admission: RE | Admit: 2019-02-04 | Discharge: 2019-02-04 | Disposition: A | Discharge status: Home / Self Care | Payer: Managed Care, Other (non HMO) | Source: Ambulatory Visit | Attending: Family Medicine | Admitting: Family Medicine

## 2019-02-04 ENCOUNTER — Encounter: Payer: Self-pay | Admitting: Gastroenterology

## 2019-02-04 VITALS — Temp 96.7°F | Ht 65.0 in | Wt 258.8 lb

## 2019-02-04 DIAGNOSIS — J4550 Severe persistent asthma, uncomplicated: Secondary | ICD-10-CM | POA: Insufficient documentation

## 2019-02-04 DIAGNOSIS — Z1211 Encounter for screening for malignant neoplasm of colon: Secondary | ICD-10-CM

## 2019-02-04 DIAGNOSIS — Z1159 Encounter for screening for other viral diseases: Secondary | ICD-10-CM

## 2019-02-04 LAB — PULMONARY FUNCTION TEST
DL/VA % pred: 92 %
DL/VA: 3.93 ml/min/mmHg/L
DLCO unc % pred: 91 %
DLCO unc: 19.69 ml/min/mmHg
FEF 25-75 Post: 2.81 L/sec
FEF 25-75 Pre: 2.43 L/sec
FEF2575-%Change-Post: 15 %
FEF2575-%Pred-Post: 102 %
FEF2575-%Pred-Pre: 88 %
FEV1-%Change-Post: 4 %
FEV1-%Pred-Post: 88 %
FEV1-%Pred-Pre: 85 %
FEV1-Post: 2.53 L
FEV1-Pre: 2.43 L
FEV1FVC-%Change-Post: 0 %
FEV1FVC-%Pred-Pre: 101 %
FEV6-%Change-Post: 3 %
FEV6-%Pred-Post: 88 %
FEV6-%Pred-Pre: 84 %
FEV6-Post: 3.1 L
FEV6-Pre: 2.99 L
FEV6FVC-%Pred-Post: 102 %
FEV6FVC-%Pred-Pre: 102 %
FVC-%Change-Post: 3 %
FVC-%Pred-Post: 85 %
FVC-%Pred-Pre: 82 %
FVC-Post: 3.1 L
FVC-Pre: 2.99 L
Post FEV1/FVC ratio: 82 %
Post FEV6/FVC ratio: 100 %
Pre FEV1/FVC ratio: 81 %
Pre FEV6/FVC Ratio: 100 %
RV % pred: 139 %
RV: 2.62 L
TLC % pred: 110 %
TLC: 5.74 L

## 2019-02-04 MED ORDER — ALBUTEROL SULFATE (2.5 MG/3ML) 0.083% IN NEBU
2.5000 mg | INHALATION_SOLUTION | Freq: Once | RESPIRATORY_TRACT | Status: AC
  Administered 2019-02-04: 2.5 mg via RESPIRATORY_TRACT

## 2019-02-04 MED ORDER — PREGABALIN 50 MG PO CAPS: ORAL_CAPSULE | ORAL | 1 refills | Status: DC

## 2019-02-04 MED ORDER — SUPREP BOWEL PREP KIT 17.5-3.13-1.6 GM/177ML PO SOLN: 1.0000 | Freq: Once | ORAL | 0 refills | Status: AC

## 2019-02-04 NOTE — Progress Notes (Signed)
No egg or soy allergy known to patient  No issues with past sedation with any surgeries  or procedures, no intubation problems  No diet pills per patient No home 02 use per patient  No blood thinners per patient  Pt states  issues with constipation  Since started low carb diet-  Uses stool softener and has soft regular stools  No A fib or A flutter  EMMI video sent to pt's e mail   SUP $15 coupon  cov test 12-3 Thursday   Due to the COVID-19 pandemic we are asking patients to follow these guidelines. Please only bring one care partner. Please be aware that your care partner may wait in the car in the parking lot or if they feel like they will be too hot to wait in the car, they may wait in the lobby on the 4th floor. All care partners are required to wear a mask the entire time (we do not have any that we can provide them), they need to practice social distancing, and we will do a Covid check for all patient's and care partners when you arrive. Also we will check their temperature and your temperature. If the care partner waits in their car they need to stay in the parking lot the entire time and we will call them on their cell phone when the patient is ready for discharge so they can bring the car to the front of the building. Also all patient's will need to wear a mask into building.

## 2019-02-10 ENCOUNTER — Other Ambulatory Visit: Payer: Self-pay | Admitting: Family Medicine

## 2019-02-10 ENCOUNTER — Encounter: Payer: Self-pay | Admitting: Family Medicine

## 2019-02-10 DIAGNOSIS — R0609 Other forms of dyspnea: Secondary | ICD-10-CM

## 2019-02-10 MED ORDER — DEXTROAMPHETAMINE SULFATE ER 10 MG PO CP24: ORAL_CAPSULE | ORAL | 0 refills | Status: DC

## 2019-02-10 NOTE — Telephone Encounter (Signed)
Please send dextroamphetamine (DEXEDRINE SPANSULE) 10 MG 24 hr capsule to [facility]

## 2019-02-10 NOTE — Telephone Encounter (Signed)
Patient requesting RF of dexedrine. Last OV 12/30/2018 Next OV 03/03/2019 Last RF 12/30/2018 # 60 x 0 RF.  Please advise.

## 2019-02-17 ENCOUNTER — Encounter: Payer: Self-pay | Admitting: Family Medicine

## 2019-02-17 DIAGNOSIS — R0609 Other forms of dyspnea: Secondary | ICD-10-CM

## 2019-02-18 ENCOUNTER — Encounter: Payer: Managed Care, Other (non HMO) | Admitting: Gastroenterology

## 2019-02-18 MED ORDER — PREGABALIN 100 MG PO CAPS: 100.0000 mg | ORAL_CAPSULE | Freq: Three times a day (TID) | ORAL | 0 refills | Status: DC

## 2019-02-18 NOTE — Telephone Encounter (Signed)
Patient given Rx for Lyrica 56m, TID and is due to f/u on 12/21. Please advise, thanks.

## 2019-02-18 NOTE — Telephone Encounter (Signed)
Increase lyrica to 100 mg three times per day. If not helping significantly after taking this dose for 1 month then we'll see if any other meds are an option. Also, your CT scan of chest was not authorized by your insurer. If lungs not doing any better then I recommend we get you to a lung specialist/pulmonologist. Let me know.-PM

## 2019-02-19 NOTE — Telephone Encounter (Signed)
Pulm referral ordered.

## 2019-02-19 NOTE — Telephone Encounter (Signed)
Patient would like to be referred to pulmonologist.

## 2019-02-20 ENCOUNTER — Other Ambulatory Visit: Payer: Self-pay | Admitting: Gastroenterology

## 2019-02-20 ENCOUNTER — Ambulatory Visit (INDEPENDENT_AMBULATORY_CARE_PROVIDER_SITE_OTHER): Payer: Self-pay

## 2019-02-20 DIAGNOSIS — Z1159 Encounter for screening for other viral diseases: Secondary | ICD-10-CM

## 2019-02-21 LAB — SARS CORONAVIRUS 2 (TAT 6-24 HRS): SARS Coronavirus 2: NEGATIVE

## 2019-02-24 ENCOUNTER — Encounter: Payer: Self-pay | Admitting: Gastroenterology

## 2019-02-24 ENCOUNTER — Telehealth: Payer: Self-pay | Admitting: Gastroenterology

## 2019-02-24 NOTE — Telephone Encounter (Signed)
Hey Dr. Fuller Plan- This patient called and cancelled her procedure for today(12/14). She states that she went out of town this weekend and stated she forgot to take the prep with her to start. Would you like to charge her?

## 2019-02-24 NOTE — Telephone Encounter (Signed)
Yes, charge for late [place] cancellation.

## 2019-02-25 ENCOUNTER — Ambulatory Visit: Payer: Managed Care, Other (non HMO) | Admitting: Pulmonary Disease

## 2019-02-25 ENCOUNTER — Encounter: Payer: Self-pay | Admitting: Pulmonary Disease

## 2019-02-25 ENCOUNTER — Other Ambulatory Visit: Payer: Self-pay

## 2019-02-25 VITALS — BP 134/64 | HR 86 | Ht 64.0 in | Wt 258.6 lb

## 2019-02-25 DIAGNOSIS — G4733 Obstructive sleep apnea (adult) (pediatric): Secondary | ICD-10-CM

## 2019-02-25 DIAGNOSIS — Z9989 Dependence on other enabling machines and devices: Secondary | ICD-10-CM

## 2019-02-25 DIAGNOSIS — R0602 Shortness of breath: Secondary | ICD-10-CM

## 2019-02-25 DIAGNOSIS — J4530 Mild persistent asthma, uncomplicated: Secondary | ICD-10-CM | POA: Diagnosis not present

## 2019-02-25 MED ORDER — TRELEGY ELLIPTA 200-62.5-25 MCG/INH IN AEPB: 1.0000 | INHALATION_SPRAY | Freq: Every day | RESPIRATORY_TRACT | 0 refills | Status: DC

## 2019-02-25 NOTE — Patient Instructions (Signed)
Trelegy 200 to replace Symbicort  Give [place] a call if it is helping  I will see you back in 3 months Continue using CPAP  Call with any significant concerns

## 2019-02-25 NOTE — Progress Notes (Signed)
[name]    [id number]    10/30/1965  Primary Care Physician:McGowen, Adrian Blackwater, MD  Referring Physician: Tammi Sou, MD [address] [place],  [place] [address]  Chief complaint:   History of obstructive sleep apnea Restless legs  HPI:  Tolerating CPAP okay but has been having more difficulty with her breathing generally Known to have asthma Currently on Symbicort, does not seem to be working as well, she has had to use prednisone a couple of times Denies any cough Denies feeling acutely ill at present No fevers or chills Has been using CPAP, tolerating it fairly well unless on days when she is having difficulty breathing  She does have symptoms suggestive of restless legs for which she has been on Requip  She has had increased snoring, increased daytime sleepiness Usually goes to bed between 10 and 11 PM Does take a little bit of time to fall asleep  Tries to get up in the morning by 10  Occupation: No pertinent occupational history Exposures: No significant exposure Smoking history: Never smoker  Outpatient Encounter Medications as of 02/25/2019  Medication Sig  . albuterol (PROVENTIL) (2.5 MG/3ML) 0.083% nebulizer solution Take 3 mLs (2.5 mg total) by nebulization every 6 (six) hours as needed for wheezing or shortness of breath.  Marland Kitchen albuterol (VENTOLIN HFA) 108 (90 Base) MCG/ACT inhaler INHALE 1 PUFF EVERY 6 HOURS AS NEEDED FOR SHORTNESS OF BREATH AND WHEEZING  . atorvastatin (LIPITOR) 40 MG tablet TAKE ONE TABLET BY MOUTH DAILY  . budesonide-formoterol (SYMBICORT) 160-4.5 MCG/ACT inhaler Inhale 2 puffs into the lungs 2 (two) times daily.  Marland Kitchen CALCIUM-VITAMIN D PO Take by mouth.  . cetirizine (ZYRTEC) 10 MG tablet Take 10 mg by mouth daily.    . citalopram (CELEXA) 40 MG tablet TAKE ONE TABLET BY MOUTH DAILY  . clonazePAM (KLONOPIN) 0.5 MG tablet TAKE 1-2 TABLETS BY MOUTH EVERY EVENING AT BEDTIME AS NEEDED FOR INSOMNIA  . dextroamphetamine  (DEXEDRINE SPANSULE) 10 MG 24 hr capsule 2 caps po qAM  . Docusate Calcium (STOOL SOFTENER PO) Take by mouth.  Marland Kitchen ibuprofen (ADVIL,MOTRIN) 200 MG tablet Take 800 mg by mouth as needed for fever or moderate pain.   . Insulin Pen Needle (BD PEN NEEDLE NANO U/F) 32G X 4 MM MISC Use with Victoza as directed once daily  . liraglutide (VICTOZA) 18 MG/3ML SOPN INJECT 1.2 MG SUBCUTANEOUSLY DAILY  . magnesium oxide (MAG-OX) 400 MG tablet Take 400 mg by mouth daily.  . Multiple Vitamin (MULTIVITAMIN) tablet Take 1 tablet by mouth daily.  . Omega-3 Fatty Acids (FISH OIL) 1200 MG CPDR Take by mouth.  . pregabalin (LYRICA) 100 MG capsule Take 1 capsule (100 mg total) by mouth 3 (three) times daily. Replaces pt current rx for 68m lyrica caps  . rOPINIRole (REQUIP) 0.5 MG tablet TAKE ONE TABLET BY MOUTH DAILY  . triamterene-hydrochlorothiazide (MAXZIDE-25) 37.5-25 MG tablet TAKE 1/2 TABLET BY MOUTH DAILY  . verapamil (CALAN) 120 MG tablet TAKE 1 TABLET BY MOUTH TWICE A DAY   No facility-administered encounter medications on file as of 02/25/2019.    Allergies as of 02/25/2019 - Review Complete 02/25/2019  Allergen Reaction Noted  . Metoprolol Other (See Comments) 01/21/2007  . Sulfonamide derivatives  01/21/2007    Past Medical History:  Diagnosis Date  . ADD (attention deficit disorder with hyperactivity)   . Allergic rhinitis   . Anxiety   . Atypical chest pain  Myocardial perf imaging 04/04/16: Low risk stress nuclear study with prior inferior infarct and mild peri-infarct ischemia; EF 55 with mild hypokinesis of the inferior wall.  Cath normal, so stress test was FALSE POS.  . Bowel obstruction ([place]) 06-25-2010   post mastectomy  . Breast cancer ([place]) 2012   left mastectomy and chemotherapy  . Cholelithiasis 03/2016   u/s: no signs of cholecystitis  . Depression   . Fibromyalgia syndrome   . GERD (gastroesophageal reflux disease)   . History of chemotherapy    2012 completed  .  Hyperlipidemia   . Hypertension   . Mild persistent asthma    dx'd age 68 (Dr. Gwenette Greet) hx of noncompliance with controller therapy due to cost. PFTs 01/2019->mild small airways obst dz, no resp to bronchodil.  . Morbid obesity ([place])   . NASH (nonalcoholic steatohepatitis) 02/2010   u/s confirmed 02/2010 (hx of transaminitis).  Repeat u/s 03/2016 showed no sign of cirrhosis or liver mass, just moderate hepatic steatosis. AST/ALT stable 05/2018.  Marland Kitchen Neuropathy of both upper extremities    and both feet  . OSA (obstructive sleep apnea)    Dr Gwenette Greet initially: pt couldn't tolerate CPAP.  Re-eval 12/2017 re-confirmed severe OSA, CPAP at 11 recommended.  . Ovarian mass    resected 07/12/10  . RLS (restless legs syndrome)   . Type II or unspecified type diabetes mellitus without mention of complication, uncontrolled 05/15/2012    Past Surgical History:  Procedure Laterality Date  . BILATERAL SALPINGOOPHORECTOMY  07/12/2010   enlarged ovary  . CARDIOVASCULAR STRESS TEST  04/04/2016   Low risk stress nuclear study with prior inferior infarct and mild peri-infarct ischemia; EF 55% with mild hypokinesis of the inferior wall.  F/u cath was NORMAL, so this is suspected to be a false pos stress test (diaphragmatic attenuation)  . G 2 P 2    . LEFT HEART CATH AND CORONARY ANGIOGRAPHY N/A 04/20/2016   Normal coronaries.  EF 55-65%, normal LV function. (Stress test prior to this was false pos due to diaphragmatic attenuation). Procedure: Left Heart Cath and Coronary Angiography;  Surgeon: Peter M Martinique, MD;  Location: [facility];  Service: Cardiovascular;  Laterality: N/A;  . MASTECTOMY  06/21/2010   Left ; Dr [facility]    Family History  Adopted: Yes  Problem Relation Age of Onset  . Healthy Son        23  . Healthy Daughter        2    Social History   Socioeconomic History  . Marital status: Married    Spouse name: Not on file  . Number of children: 2  . Years of education:  Not on file  . Highest education level: Not on file  Occupational History  . Occupation: Curator: artist j  Tobacco Use  . Smoking status: Former Smoker    Packs/day: 0.50    Years: 24.00    Pack years: 12.00    Types: Cigarettes    Quit date: 08/16/2018    Years since quitting: 0.5  . Smokeless tobacco: Never Used  Substance and Sexual Activity  . Alcohol use: No  . Drug use: No  . Sexual activity: Not on file  Other Topics Concern  . Not on file  Social History Narrative   Not working as of 09/2017.  Previously worked as a Art therapist.   Lives with husband and two children.   2-3 caffeine drinks daily  Social Determinants of Health   Financial Resource Strain:   . Difficulty of Paying Living Expenses: Not on file  Food Insecurity:   . Worried About Charity fundraiser in the Last Year: Not on file  . Ran Out of Food in the Last Year: Not on file  Transportation Needs:   . Lack of Transportation (Medical): Not on file  . Lack of Transportation (Non-Medical): Not on file  Physical Activity:   . Days of Exercise per Week: Not on file  . Minutes of Exercise per Session: Not on file  Stress:   . Feeling of Stress : Not on file  Social Connections:   . Frequency of Communication with Friends and Family: Not on file  . Frequency of Social Gatherings with Friends and Family: Not on file  . Attends Religious Services: Not on file  . Active Member of Clubs or Organizations: Not on file  . Attends Archivist Meetings: Not on file  . Marital Status: Not on file  Intimate Partner Violence:   . Fear of Current or Ex-Partner: Not on file  . Emotionally Abused: Not on file  . Physically Abused: Not on file  . Sexually Abused: Not on file    Review of Systems  Constitutional: Negative.   HENT: Negative.   Respiratory: Positive for apnea and shortness of breath.   Cardiovascular: Negative.   Gastrointestinal: Negative.   Musculoskeletal:  Negative.   Psychiatric/Behavioral: Positive for sleep disturbance.    Vitals:   02/25/19 1533  BP: 134/64  Pulse: 86  SpO2: 95%    Physical Exam  Constitutional: She appears well-developed and well-nourished.  HENT:  Head: Normocephalic and atraumatic.  Crowded oropharynx  Eyes: Pupils are equal, round, and reactive to light. Conjunctivae and EOM are normal. Right eye exhibits no discharge. Left eye exhibits no discharge.  Neck: No tracheal deviation present. No thyromegaly present.  Cardiovascular: Normal rate and regular rhythm.  Pulmonary/Chest: Effort normal and breath sounds normal. No respiratory distress. She has no wheezes. She has no rales.  Musculoskeletal:        General: No edema.     Cervical back: Normal range of motion and neck supple.  Neurological: She is alert.   Epworth sleepiness of 13  Compliance data reveals 100% compliance with CPAP use, CPAP of 11, residual AHI of 3.0  Assessment:  Obstructive sleep apnea -Continue CPAP use  Restless leg syndrome -Continue Requip  Nonrestorative sleep  Shortness of breath/asthma exacerbation -Symbicort does not seem to be helping -Significant use of rescue inhaler -We will try Trelegy 200  Plan/Recommendations: Trelegy 200 -Call to let us know if it is helping, we will call in a prescription  Continue CPAP  Continue weight loss efforts  I will see you back in about 3 months     Sherrilyn Rist MD [facility] 02/25/2019, 3:45 PM  CC: McGowen, Adrian Blackwater, MD

## 2019-02-25 NOTE — Addendum Note (Signed)
Addended by: Elton Sin on: 02/25/2019 04:04 PM   Modules accepted: Orders

## 2019-03-03 ENCOUNTER — Ambulatory Visit (INDEPENDENT_AMBULATORY_CARE_PROVIDER_SITE_OTHER): Payer: Managed Care, Other (non HMO) | Admitting: Family Medicine

## 2019-03-03 ENCOUNTER — Other Ambulatory Visit: Payer: Self-pay

## 2019-03-03 ENCOUNTER — Encounter: Payer: Self-pay | Admitting: Family Medicine

## 2019-03-03 DIAGNOSIS — M797 Fibromyalgia: Secondary | ICD-10-CM | POA: Diagnosis not present

## 2019-03-03 DIAGNOSIS — R0600 Dyspnea, unspecified: Secondary | ICD-10-CM | POA: Diagnosis not present

## 2019-03-03 DIAGNOSIS — R0609 Other forms of dyspnea: Secondary | ICD-10-CM

## 2019-03-03 DIAGNOSIS — R002 Palpitations: Secondary | ICD-10-CM | POA: Diagnosis not present

## 2019-03-03 DIAGNOSIS — J4550 Severe persistent asthma, uncomplicated: Secondary | ICD-10-CM

## 2019-03-03 NOTE — Progress Notes (Signed)
Virtual Visit via Video Note  I connected with pt on 03/03/19 at  2:00 PM EST by a video enabled telemedicine application and verified that I am speaking with the correct person using two identifiers.  Location patient: home Location provider:work or home office Persons participating in the virtual visit: patient, provider  I discussed the limitations of evaluation and management by telemedicine and the availability of in person appointments. The patient expressed understanding and agreed to proceed.  Telemedicine visit is a necessity given the COVID-19 restrictions in place at the current time.  HPI: 53 y/o WF being seen today for 1 mo f/u fibromyalgia and persistent problem with SOB/DOE that I've been treating based on working dx of asthma.  However, her problems have essentially persisted unchanged.  I ordered a CT scan of lungs but her insurer declined to cover this. She saw her pulm MD, Dr. Ander Slade, on 02/25/19 for f/u of her OSA and he started her on trelegy 200 since symbicort seemed to not be helping much. She feels improved but still has to use her rescue albut daily.  It is hard to discern whether she is waiting long enough for her DOE/SOB to spontaneously calm down for more than a minute or two.  I sometimes think she may resort to her rescue inhaler too quickly.  No cough or fever.  Fibromyalgia syndrome: Lyrica helping but she still deals with widespread myofascial pain daily and is choosing to just deal with it most of the time.  She finds taking the 147m lyric bid is sufficiently helpful at this time.  HTN/palpitations: I increased her verapamil to a whole 120 mg tab bid last visit. She is still having intermittent feeling of brief palpitations w/out any associated CP or dizziness.  When she does have her SOB/DOE sx's she notes the palpitations come more often.    No new complaints.  ROS: no HAs, no rashes, no melena/hematochezia.  No polyuria or polydipsia.     Past Medical  History:  Diagnosis Date  . ADD (attention deficit disorder with hyperactivity)   . Allergic rhinitis   . Anxiety   . Atypical chest pain    Myocardial perf imaging 04/04/16: Low risk stress nuclear study with prior inferior infarct and mild peri-infarct ischemia; EF 55 with mild hypokinesis of the inferior wall.  Cath normal, so stress test was FALSE POS.  . [place] 06-25-2010   post mastectomy  . Breast cancer ([place] 2012   left mastectomy and chemotherapy  . Cholelithiasis 03/2016   u/s: no signs of cholecystitis  . Depression   . Fibromyalgia syndrome   . GERD (gastroesophageal reflux disease)   . History of chemotherapy    2012 completed  . Hyperlipidemia   . Hypertension   . Mild persistent asthma    dx'd age 53(Dr. CGwenette Greet hx of noncompliance with controller therapy due to cost. PFTs 01/2019->mild small airways obst dz, no resp to bronchodil.  . Morbid obesity ([facility]   . NASH (nonalcoholic steatohepatitis) 02/2010   u/s confirmed 02/2010 (hx of transaminitis).  Repeat u/s 03/2016 showed no sign of cirrhosis or liver mass, just moderate hepatic steatosis. AST/ALT stable 05/2018.  .Marland KitchenNeuropathy of both upper extremities    and both feet  . OSA (obstructive sleep apnea)    Dr CGwenette Greetinitially: pt couldn't tolerate CPAP.  Re-eval 12/2017 re-confirmed severe OSA, CPAP at 11 recommended.  . Ovarian mass    resected 07/12/10  . RLS (restless legs syndrome)   .  Type II or unspecified type diabetes mellitus without mention of complication, uncontrolled 05/15/2012    Past Surgical History:  Procedure Laterality Date  . BILATERAL SALPINGOOPHORECTOMY  07/12/2010   enlarged ovary  . CARDIOVASCULAR STRESS TEST  04/04/2016   Low risk stress nuclear study with prior inferior infarct and mild peri-infarct ischemia; EF 55% with mild hypokinesis of the inferior wall.  F/u cath was NORMAL, so this is suspected to be a false pos stress test (diaphragmatic attenuation)  . G 2 P 2    .  LEFT HEART CATH AND CORONARY ANGIOGRAPHY N/A 04/20/2016   Normal coronaries.  EF 55-65%, normal LV function. (Stress test prior to this was false pos due to diaphragmatic attenuation). Procedure: Left Heart Cath and Coronary Angiography;  Surgeon: Peter M Martinique, MD;  Location: [facility];  Service: Cardiovascular;  Laterality: N/A;  . MASTECTOMY  06/21/2010   Left ; Dr [facility]    Family History  Adopted: Yes  Problem Relation Age of Onset  . Healthy Son        51  . Healthy Daughter        89      Current Outpatient Medications:  .  albuterol (PROVENTIL) (2.5 MG/3ML) 0.083% nebulizer solution, Take 3 mLs (2.5 mg total) by nebulization every 6 (six) hours as needed for wheezing or shortness of breath., Disp: 75 mL, Rfl: 12 .  albuterol (VENTOLIN HFA) 108 (90 Base) MCG/ACT inhaler, INHALE 1 PUFF EVERY 6 HOURS AS NEEDED FOR SHORTNESS OF BREATH AND WHEEZING, Disp: 8.5 g, Rfl: 1 .  atorvastatin (LIPITOR) 40 MG tablet, TAKE ONE TABLET BY MOUTH DAILY, Disp: 90 tablet, Rfl: 1 .  budesonide-formoterol (SYMBICORT) 160-4.5 MCG/ACT inhaler, Inhale 2 puffs into the lungs 2 (two) times daily., Disp: 1 Inhaler, Rfl: 5 .  CALCIUM-VITAMIN D PO, Take by mouth., Disp: , Rfl:  .  cetirizine (ZYRTEC) 10 MG tablet, Take 10 mg by mouth daily.  , Disp: , Rfl:  .  citalopram (CELEXA) 40 MG tablet, TAKE ONE TABLET BY MOUTH DAILY, Disp: 90 tablet, Rfl: 1 .  clonazePAM (KLONOPIN) 0.5 MG tablet, TAKE 1-2 TABLETS BY MOUTH EVERY EVENING AT BEDTIME AS NEEDED FOR INSOMNIA, Disp: 60 tablet, Rfl: 5 .  dextroamphetamine (DEXEDRINE SPANSULE) 10 MG 24 hr capsule, 2 caps po qAM, Disp: 60 capsule, Rfl: 0 .  Docusate Calcium (STOOL SOFTENER PO), Take by mouth., Disp: , Rfl:  .  Fluticasone-Umeclidin-Vilant (TRELEGY ELLIPTA) 200-62.5-25 MCG/INH AEPB, Inhale 1 puff into the lungs daily., Disp: 14 each, Rfl: 0 .  ibuprofen (ADVIL,MOTRIN) 200 MG tablet, Take 800 mg by mouth as needed for fever or moderate pain. ,  Disp: , Rfl:  .  Insulin Pen Needle (BD PEN NEEDLE NANO U/F) 32G X 4 MM MISC, Use with Victoza as directed once daily, Disp: 100 each, Rfl: 1 .  liraglutide (VICTOZA) 18 MG/3ML SOPN, INJECT 1.2 MG SUBCUTANEOUSLY DAILY, Disp: 6 mL, Rfl: 11 .  magnesium oxide (MAG-OX) 400 MG tablet, Take 400 mg by mouth daily., Disp: , Rfl:  .  Multiple Vitamin (MULTIVITAMIN) tablet, Take 1 tablet by mouth daily., Disp: , Rfl:  .  Omega-3 Fatty Acids (FISH OIL) 1200 MG CPDR, Take by mouth., Disp: , Rfl:  .  pregabalin (LYRICA) 100 MG capsule, Take 1 capsule (100 mg total) by mouth 3 (three) times daily. Replaces pt current rx for 6m lyrica caps, Disp: 90 capsule, Rfl: 0 .  rOPINIRole (REQUIP) 0.5 MG tablet, TAKE ONE TABLET BY  MOUTH DAILY, Disp: 90 tablet, Rfl: 2 .  triamterene-hydrochlorothiazide (MAXZIDE-25) 37.5-25 MG tablet, TAKE 1/2 TABLET BY MOUTH DAILY, Disp: 45 tablet, Rfl: 0 .  verapamil (CALAN) 120 MG tablet, TAKE 1 TABLET BY MOUTH TWICE A DAY, Disp: 180 tablet, Rfl: 0  EXAM:  VITALS per patient if applicable: LMP 30/86/5784 Comment: ovaries removed 2012   GENERAL: alert, oriented, appears well and in no acute distress  HEENT: atraumatic, conjunttiva clear, no obvious abnormalities on inspection of external nose and ears  NECK: normal movements of the head and neck  LUNGS: on inspection no signs of respiratory distress, breathing rate appears normal, no obvious gross SOB, gasping or wheezing  CV: no obvious cyanosis  MS: moves all visible extremities without noticeable abnormality  PSYCH/NEURO: pleasant and cooperative, no obvious depression or anxiety, speech and thought processing grossly intact  LABS: none today  Lab Results  Component Value Date   WBC 9.2 12/09/2018   HGB 13.9 12/09/2018   HCT 40.3 12/09/2018   MCV 93.3 12/09/2018   PLT 268.0 12/09/2018     Chemistry      Component Value Date/Time   NA 138 12/09/2018 1104   NA 141 01/15/2015 0911   K 4.3 12/09/2018 1104   K  3.6 01/15/2015 0911   CL 101 12/09/2018 1104   CO2 27 12/09/2018 1104   CO2 27 01/15/2015 0911   BUN 10 12/09/2018 1104   BUN 17.2 01/15/2015 0911   CREATININE 0.61 12/09/2018 1104   CREATININE 0.67 04/12/2016 1311   CREATININE 0.8 01/15/2015 0911      Component Value Date/Time   CALCIUM 10.1 12/09/2018 1104   CALCIUM 10.7 (H) 01/15/2015 0911   ALKPHOS 40 12/09/2018 1104   ALKPHOS 42 01/15/2015 0911   AST 68 (H) 12/09/2018 1104   AST 64 (H) 01/15/2015 0911   ALT 123 (H) 12/09/2018 1104   ALT 153 (H) 01/15/2015 0911   BILITOT 0.8 12/09/2018 1104   BILITOT 0.57 01/15/2015 0911     Lab Results  Component Value Date   HGBA1C 7.9 (H) 12/09/2018   Lab Results  Component Value Date   CHOL 115 12/09/2018   HDL 34.40 (L) 12/09/2018   LDLCALC 47 12/09/2018   LDLDIRECT 70.0 10/03/2017   TRIG 165.0 (H) 12/09/2018   CHOLHDL 3 12/09/2018    ASSESSMENT AND PLAN:  Discussed the following assessment and plan:  1) DOE/SOB: suspect non-wheezing asthma. Improved some with recent switch to trelegy ellipta. Hopefully she'll be able to decrease use of rescue albuterol soon. I do think she has some obesity hypoventilation syndrome playing a role in her symptoms. Insurer would not approve CT chest for further eval of interstitial lung dz. In future, I'm considering ? ESR, CRP?, ACE level?  2) Fibromyalgia syndrome: pretty stable but by no means in "remission". No changes in meds at this time.  Continue lyrica 155m bid.  3) Tachypalpitations: stable on verapamil 1279mbid. These occur more when she has her asthmatic sx's.  -we discussed possible serious and likely etiologies, options for evaluation and workup, limitations of telemedicine visit vs in person visit, treatment, treatment risks and precautions. Pt prefers to treat via telemedicine empirically rather then risking or undertaking an in person visit at this moment. Patient agrees to seek prompt in person care if worsening, new  symptoms arise, or if is not improving with treatment.   I discussed the assessment and treatment plan with the patient. The patient was provided an opportunity to ask questions and  all were answered. The patient agreed with the plan and demonstrated an understanding of the instructions.   The patient was advised to call back or seek an in-person evaluation if the symptoms worsen or if the condition fails to improve as anticipated.  F/u: 3 mo RCI (need to f/u DM, HTN, HLD, anxiety (benzo) at that time as well  Signed:  Crissie Sickles, MD           03/03/2019

## 2019-03-05 ENCOUNTER — Ambulatory Visit: Payer: Managed Care, Other (non HMO)

## 2019-03-09 ENCOUNTER — Other Ambulatory Visit: Payer: Self-pay | Admitting: Family Medicine

## 2019-03-12 ENCOUNTER — Encounter: Payer: Self-pay | Admitting: Family Medicine

## 2019-03-12 ENCOUNTER — Telehealth: Payer: Self-pay | Admitting: Pulmonary Disease

## 2019-03-12 MED ORDER — TRELEGY ELLIPTA 200-62.5-25 MCG/INH IN AEPB: 1.0000 | INHALATION_SPRAY | Freq: Every day | RESPIRATORY_TRACT | 5 refills | Status: DC

## 2019-03-12 NOTE — Telephone Encounter (Signed)
Rx for trelegy sent to pt's preferred pharmacy. Called and spoke with pt letting her know this had been done and she verbalized understanding. Nothing further needed.

## 2019-03-16 ENCOUNTER — Other Ambulatory Visit: Payer: Self-pay | Admitting: Family Medicine

## 2019-03-17 ENCOUNTER — Telehealth: Payer: Self-pay | Admitting: Family Medicine

## 2019-03-17 MED ORDER — PREGABALIN 150 MG PO CAPS: 150.0000 mg | ORAL_CAPSULE | Freq: Two times a day (BID) | ORAL | 2 refills | Status: DC

## 2019-03-17 NOTE — Telephone Encounter (Signed)
RF request for lyrica. Last OV 01/30/2019 No upcoming OV Last RX dated 02/18/2019 # 90 x 0 rf  Please advise.

## 2019-03-17 NOTE — Telephone Encounter (Signed)
I denied [name]'s recent pharmacy request for lyrica 100 mg tid b/c at her last visit we decided she was going to take 135m twice a day instead. I just sent this rx in--pls let her know-thx

## 2019-03-24 ENCOUNTER — Other Ambulatory Visit: Payer: Self-pay | Admitting: Family Medicine

## 2019-03-25 NOTE — Telephone Encounter (Signed)
RF request for clonazepam. Last OV 03/03/2019 No upcoming appt Last RX 09/06/2018 # 60 x 5 RFS.  Please advise.

## 2019-04-01 ENCOUNTER — Other Ambulatory Visit: Payer: Self-pay | Admitting: Family Medicine

## 2019-04-01 NOTE — Telephone Encounter (Signed)
RF request for citalopram. Last RF 05/14/2018 # 90 x 1 refill.  ( gap in taking RX regularly?? )   Okay to RF?

## 2019-04-14 ENCOUNTER — Encounter: Payer: Self-pay | Admitting: Family Medicine

## 2019-04-22 ENCOUNTER — Ambulatory Visit
Admission: RE | Admit: 2019-04-22 | Discharge: 2019-04-22 | Disposition: A | Discharge status: Home / Self Care | Payer: Managed Care, Other (non HMO) | Source: Ambulatory Visit | Attending: Family Medicine | Admitting: Family Medicine

## 2019-04-22 ENCOUNTER — Other Ambulatory Visit: Payer: Self-pay

## 2019-04-22 DIAGNOSIS — Z1239 Encounter for other screening for malignant neoplasm of breast: Secondary | ICD-10-CM

## 2019-04-30 ENCOUNTER — Ambulatory Visit: Payer: Managed Care, Other (non HMO) | Admitting: Pulmonary Disease

## 2019-05-01 ENCOUNTER — Ambulatory Visit: Payer: Managed Care, Other (non HMO) | Admitting: Pulmonary Disease

## 2019-05-12 ENCOUNTER — Other Ambulatory Visit: Payer: Self-pay | Admitting: Family Medicine

## 2019-05-15 ENCOUNTER — Other Ambulatory Visit: Payer: Self-pay

## 2019-05-15 MED ORDER — DEXTROAMPHETAMINE SULFATE ER 10 MG PO CP24: ORAL_CAPSULE | ORAL | 0 refills | Status: DC

## 2019-05-15 NOTE — Telephone Encounter (Signed)
Requesting: Dexedrine Contract:09/06/18 UDS:09/06/18 Last Visit:03/03/19 Next Visit:05/21/19 Last Refill:02/10/19(60,0)  Please Advise. Medication pending

## 2019-05-15 NOTE — Telephone Encounter (Signed)
Patient refill request  dextroamphetamine (DEXEDRINE SPANSULE) 10 MG 24 hr capsule [[id number]]   [facility] [id number] [place], [place]

## 2019-05-16 NOTE — Telephone Encounter (Signed)
Patient advised.

## 2019-05-20 ENCOUNTER — Other Ambulatory Visit: Payer: Self-pay

## 2019-05-21 ENCOUNTER — Other Ambulatory Visit: Payer: Self-pay

## 2019-05-21 ENCOUNTER — Ambulatory Visit: Payer: Managed Care, Other (non HMO) | Admitting: Pulmonary Disease

## 2019-05-21 ENCOUNTER — Ambulatory Visit (INDEPENDENT_AMBULATORY_CARE_PROVIDER_SITE_OTHER): Payer: Managed Care, Other (non HMO) | Admitting: Family Medicine

## 2019-05-21 ENCOUNTER — Ambulatory Visit: Payer: Self-pay | Admitting: Family Medicine

## 2019-05-21 ENCOUNTER — Encounter: Payer: Self-pay | Admitting: Family Medicine

## 2019-05-21 DIAGNOSIS — M3500 Sicca syndrome, unspecified: Secondary | ICD-10-CM | POA: Diagnosis not present

## 2019-05-21 DIAGNOSIS — K117 Disturbances of salivary secretion: Secondary | ICD-10-CM | POA: Diagnosis not present

## 2019-05-21 DIAGNOSIS — J019 Acute sinusitis, unspecified: Secondary | ICD-10-CM

## 2019-05-21 DIAGNOSIS — E1165 Type 2 diabetes mellitus with hyperglycemia: Secondary | ICD-10-CM | POA: Diagnosis not present

## 2019-05-21 DIAGNOSIS — R0609 Other forms of dyspnea: Secondary | ICD-10-CM

## 2019-05-21 DIAGNOSIS — R0600 Dyspnea, unspecified: Secondary | ICD-10-CM

## 2019-05-21 DIAGNOSIS — J4540 Moderate persistent asthma, uncomplicated: Secondary | ICD-10-CM

## 2019-05-21 MED ORDER — AMOXICILLIN-POT CLAVULANATE 875-125 MG PO TABS: 1.0000 | ORAL_TABLET | Freq: Two times a day (BID) | ORAL | 0 refills | Status: DC

## 2019-05-21 MED ORDER — MONTELUKAST SODIUM 10 MG PO TABS: 10.0000 mg | ORAL_TABLET | Freq: Every day | ORAL | 3 refills | Status: DC

## 2019-05-21 NOTE — Progress Notes (Deleted)
@Patient  ID: [name], female    DOB: [birth year], 54 y.o.   MRN: [id number]  No chief complaint on file.   Referring provider: Tammi Sou, MD  HPI:   PMH:  Smoker/ Smoking History:  Maintenance:   Pt of:   05/21/2019  - Visit     Questionaires / Pulmonary Flowsheets:   ACT:  No flowsheet data found.  MMRC: mMRC Dyspnea Scale mMRC Score  02/25/2019 1    Epworth:  Results of the Epworth flowsheet 12/13/2017  Sitting and reading 3  Watching TV 3  Sitting, inactive in a public place (e.g. a theatre or a meeting) 0  As a passenger in a car for an hour without a break 2  Lying down to rest in the afternoon when circumstances permit 2  Sitting and talking to someone 0  Sitting quietly after a lunch without alcohol 0  In a car, while stopped for a few minutes in traffic 3  Total score 13    Tests:   FENO:  No results found for: NITRICOXIDE  PFT: PFT Results Latest Ref Rng & Units 02/04/2019  FVC-Pre L 2.99  FVC-Predicted Pre % 82  FVC-Post L 3.10  FVC-Predicted Post % 85  Pre FEV1/FVC % % 81  Post FEV1/FCV % % 82  FEV1-Pre L 2.43  FEV1-Predicted Pre % 85  FEV1-Post L 2.53  DLCO UNC% % 91  DLCO COR %Predicted % 92  TLC L 5.74  TLC % Predicted % 110  RV % Predicted % 139    WALK:  No flowsheet data found.  Imaging: MM 3D SCREEN BREAST UNI RIGHT  Result Date: 04/24/2019 CLINICAL DATA:  Screening. History of left mastectomy in 2012. EXAM: DIGITAL SCREENING UNILATERAL RIGHT MAMMOGRAM WITH CAD AND TOMO COMPARISON:  Previous exam(s). ACR Breast Density Category a: The breast tissue is almost entirely fatty. FINDINGS: There are no findings suspicious for malignancy. Images were processed with CAD. IMPRESSION: No mammographic evidence of malignancy. A result letter of this screening mammogram will be mailed directly to the patient. RECOMMENDATION: Screening mammogram in one year. (Code:SM-B-01Y) BI-RADS CATEGORY  1: Negative. Electronically  Signed   By: Zerita Boers M.D.   On: 04/24/2019 14:07    Lab Results:  CBC    Component Value Date/Time   WBC 9.2 12/09/2018 1104   RBC 4.32 12/09/2018 1104   HGB 13.9 12/09/2018 1104   HGB 14.0 01/15/2015 0911   HCT 40.3 12/09/2018 1104   HCT 40.2 01/15/2015 0911   PLT 268.0 12/09/2018 1104   PLT 277 01/15/2015 0911   MCV 93.3 12/09/2018 1104   MCV 91.3 01/15/2015 0911   MCH 31.7 11/07/2018 2342   MCHC 34.6 12/09/2018 1104   RDW 12.9 12/09/2018 1104   RDW 12.8 01/15/2015 0911   LYMPHSABS 3.5 12/09/2018 1104   LYMPHSABS 3.0 01/15/2015 0911   MONOABS 0.5 12/09/2018 1104   MONOABS 0.6 01/15/2015 0911   EOSABS 0.3 12/09/2018 1104   EOSABS 0.3 01/15/2015 0911   BASOSABS 0.1 12/09/2018 1104   BASOSABS 0.1 01/15/2015 0911    BMET    Component Value Date/Time   NA 138 12/09/2018 1104   NA 141 01/15/2015 0911   K 4.3 12/09/2018 1104   K 3.6 01/15/2015 0911   CL 101 12/09/2018 1104   CO2 27 12/09/2018 1104   CO2 27 01/15/2015 0911   GLUCOSE 128 (H) 12/09/2018 1104   GLUCOSE 153 (H) 01/15/2015 0911   BUN 10 12/09/2018 1104  BUN 17.2 01/15/2015 0911   CREATININE 0.61 12/09/2018 1104   CREATININE 0.67 04/12/2016 1311   CREATININE 0.8 01/15/2015 0911   CALCIUM 10.1 12/09/2018 1104   CALCIUM 10.7 (H) 01/15/2015 0911   GFRNONAA >60 11/07/2018 2342   GFRAA >60 11/07/2018 2342    BNP    Component Value Date/Time   BNP 9.0 07/23/2015 0928    ProBNP No results found for: PROBNP  Specialty Problems      Pulmonary Problems   Obstructive sleep apnea    Qualifier: Diagnosis of  By: Jerold Coombe  Dr Clance; intolerant to CPAP. Restless leg syndrome is felt to be a significant component of the sleep disorder       ALLERGIC RHINITIS    Qualifier: Diagnosis of  By: Gwenette Greet MD, Armando Reichert       Asthma, mild persistent    Qualifier: Diagnosis of  By: Gwenette Greet MD, Armando Reichert  Diagnosed @ age 77; triggers : allergy, exercise, & infection       Maxillary  sinusitis, acute      Allergies  Allergen Reactions  . Metoprolol Other (See Comments)    REACTION: " chest pain"  . Sulfa Antibiotics Nausea And Vomiting    Immunization History  Administered Date(s) Administered  . Hepatitis A 11/22/2005  . Hepatitis B 07/16/2001  . Influenza,inj,Quad PF,6+ Mos 12/10/2012, 01/11/2017, 12/13/2017, 12/09/2018  . Pneumococcal Polysaccharide-23 12/10/2012  . Td 04/17/2003  . Tdap 04/21/2013  . Typhoid Live 11/22/2005  . Yellow Fever 11/22/2005  . Zoster Recombinat (Shingrix) 10/03/2017, 12/19/2017    Past Medical History:  Diagnosis Date  . ADD (attention deficit disorder with hyperactivity)   . Allergic rhinitis   . Anxiety   . Atypical chest pain    Myocardial perf imaging 04/04/16: Low risk stress nuclear study with prior inferior infarct and mild peri-infarct ischemia; EF 55 with mild hypokinesis of the inferior wall.  Cath normal, so stress test was FALSE POS.  . Bowel obstruction ([place]) 06-25-2010   post mastectomy  . Breast cancer ([place]) 2012   left mastectomy and chemotherapy  . Cholelithiasis 03/2016   u/s: no signs of cholecystitis  . Depression   . Fibromyalgia syndrome   . GERD (gastroesophageal reflux disease)   . History of chemotherapy    2012 completed  . Hyperlipidemia   . Hypertension   . Mild persistent asthma    dx'd age 57 (Dr. Gwenette Greet) hx of noncompliance with controller therapy due to cost. PFTs 01/2019->mild small airways obst dz, no resp to bronchodil. Trelegy started 02/2019 by Dr. Ander Slade  . Morbid obesity ([place])   . NASH (nonalcoholic steatohepatitis) 02/2010   u/s confirmed 02/2010 (hx of transaminitis).  Repeat u/s 03/2016 showed no sign of cirrhosis or liver mass, just moderate hepatic steatosis. AST/ALT stable 05/2018.  Marland Kitchen Neuropathy of both upper extremities    and both feet  . OSA (obstructive sleep apnea)    Dr Gwenette Greet initially: pt couldn't tolerate CPAP.  Re-eval 12/2017 re-confirmed severe OSA, CPAP at 11  recommended.  . Ovarian mass    resected 07/12/10  . RLS (restless legs syndrome)   . Type II or unspecified type diabetes mellitus without mention of complication, uncontrolled 05/15/2012    Tobacco History: Social History   Tobacco Use  Smoking Status Former Smoker  . Packs/day: 0.50  . Years: 24.00  . Pack years: 12.00  . Types: Cigarettes  . Quit date: 08/16/2018  . Years since quitting: 0.7  Smokeless Tobacco Never  Used   Counseling given: Not Answered   Continue to not smoke  Outpatient Encounter Medications as of 05/22/2019  Medication Sig  . ADVAIR DISKUS 500-50 MCG/DOSE AEPB   . albuterol (PROVENTIL) (2.5 MG/3ML) 0.083% nebulizer solution Take 3 mLs (2.5 mg total) by nebulization every 6 (six) hours as needed for wheezing or shortness of breath.  Marland Kitchen albuterol (VENTOLIN HFA) 108 (90 Base) MCG/ACT inhaler INHALE 1 PUFF BY MOUTH EVERY 6 HOURS AS NEEDED FOR SHORTNESS OR BREATH AND WHEEZING  . amoxicillin-clavulanate (AUGMENTIN) 875-125 MG tablet Take 1 tablet by mouth 2 (two) times daily.  Marland Kitchen atorvastatin (LIPITOR) 40 MG tablet TAKE ONE TABLET BY MOUTH DAILY  . CALCIUM-VITAMIN D PO Take by mouth.  . cetirizine (ZYRTEC) 10 MG tablet Take 10 mg by mouth daily.    . citalopram (CELEXA) 40 MG tablet TAKE ONE TABLET BY MOUTH DAILY  . clonazePAM (KLONOPIN) 0.5 MG tablet TAKE 1-2 TABLETS BY MOUTH EVERY EVENING AT BEDTIME AS NEEDED FOR INSOMNIA  . dextroamphetamine (DEXEDRINE SPANSULE) 10 MG 24 hr capsule 2 caps po qAM  . Docusate Calcium (STOOL SOFTENER PO) Take by mouth.  . Fluticasone-Umeclidin-Vilant (TRELEGY ELLIPTA) 200-62.5-25 MCG/INH AEPB Inhale 1 puff into the lungs daily.  Marland Kitchen ibuprofen (ADVIL,MOTRIN) 200 MG tablet Take 800 mg by mouth as needed for fever or moderate pain.   . Insulin Pen Needle (BD PEN NEEDLE NANO U/F) 32G X 4 MM MISC Use with Victoza as directed once daily  . liraglutide (VICTOZA) 18 MG/3ML SOPN INJECT 1.2 MG SUBCUTANEOUSLY DAILY  . magnesium oxide (MAG-OX)  400 MG tablet Take 400 mg by mouth daily.  . montelukast (SINGULAIR) 10 MG tablet Take 1 tablet (10 mg total) by mouth at bedtime.  . Multiple Vitamin (MULTIVITAMIN) tablet Take 1 tablet by mouth daily.  . Omega-3 Fatty Acids (FISH OIL) 1200 MG CPDR Take by mouth.  . pregabalin (LYRICA) 150 MG capsule Take 1 capsule (150 mg total) by mouth 2 (two) times daily.  Marland Kitchen rOPINIRole (REQUIP) 0.5 MG tablet TAKE ONE TABLET BY MOUTH DAILY  . triamterene-hydrochlorothiazide (MAXZIDE-25) 37.5-25 MG tablet TAKE 1/2 TABLET BY MOUTH DAILY  . verapamil (CALAN) 120 MG tablet TAKE 1 TABLET BY MOUTH TWICE A DAY   No facility-administered encounter medications on file as of 05/22/2019.     Review of Systems  Review of Systems   Physical Exam  LMP 07/14/2010 Comment: ovaries removed 2012  Wt Readings from Last 5 Encounters:  02/25/19 258 lb 9.6 oz (117.3 kg)  02/04/19 258 lb 12.8 oz (117.4 kg)  01/30/19 255 lb 9.6 oz (115.9 kg)  12/09/18 262 lb 6 oz (119 kg)  11/07/18 255 lb (115.7 kg)    BMI Readings from Last 5 Encounters:  02/25/19 44.39 kg/m  02/04/19 43.07 kg/m  01/30/19 42.53 kg/m  12/09/18 43.66 kg/m  11/07/18 42.43 kg/m     Physical Exam    Assessment & Plan:   No problem-specific Assessment & Plan notes found for this encounter.    No follow-ups on file.   Lauraine Rinne, NP 05/21/2019   This appointment required *** minutes of patient care (this includes precharting, chart review, review of results, face-to-face care, etc.).

## 2019-05-21 NOTE — Progress Notes (Signed)
Virtual Visit via Video Note  I connected with [name] on 05/21/19 at  8:00 AM EST by a video enabled telemedicine application and verified that I am speaking with the correct person using two identifiers.  Location patient: home Location provider:work or home office Persons participating in the virtual visit: patient, provider  I discussed the limitations of evaluation and management by telemedicine and the availability of in person appointments. The patient expressed understanding and agreed to proceed.  Telemedicine visit is a necessity given the COVID-19 restrictions in place at the current time.  HPI: 54 y/o WF being seen today for nasal congestion as well as burning with urination. Nasal congestion, sinus pain diffusely, lots of sticky/thick/blood tinged mucous from nose, onset about 2 wks ago, worsening the last week.  NO cough or fever.  No SOB. No HA, body aches chronic/unchanged.   Dayquil/nyquil tried.  Mouth, nose dry more lately.  Eyes always dry. Urine burning intermittently, not consistent.  No urine frequency or urgency. +Chronic vaginal dryness. Using nasal saline. Not monitoring glucoses.  No exposure to anyone with covid infection.  ROS: no fevers, no CP, no dizziness,  no rashes, no melena/hematochezia.  No polyuria or polydipsia.  Chronic baseline myalgias or arthralgias + "brain fog" c/w dx of fibromyalgia. No joint swelling or redness.  No n/v/d or abd pain.   Past Medical History:  Diagnosis Date  . ADD (attention deficit disorder with hyperactivity)   . Allergic rhinitis   . Anxiety   . Atypical chest pain    Myocardial perf imaging 04/04/16: Low risk stress nuclear study with prior inferior infarct and mild peri-infarct ischemia; EF 55 with mild hypokinesis of the inferior wall.  Cath normal, so stress test was FALSE POS.  . Bowel obstruction ([place]) 06-25-2010   post mastectomy  . Breast cancer ([name]) 2012   left mastectomy and chemotherapy  . Cholelithiasis  03/2016   u/s: no signs of cholecystitis  . Depression   . Fibromyalgia syndrome   . GERD (gastroesophageal reflux disease)   . History of chemotherapy    2012 completed  . Hyperlipidemia   . Hypertension   . Mild persistent asthma    dx'd age 69 (Dr. Gwenette Greet) hx of noncompliance with controller therapy due to cost. PFTs 01/2019->mild small airways obst dz, no resp to bronchodil. Trelegy started 02/2019 by Dr. Ander Slade  . Morbid obesity ([place])   . NASH (nonalcoholic steatohepatitis) 02/2010   u/s confirmed 02/2010 (hx of transaminitis).  Repeat u/s 03/2016 showed no sign of cirrhosis or liver mass, just moderate hepatic steatosis. AST/ALT stable 05/2018.  Marland Kitchen Neuropathy of both upper extremities    and both feet  . OSA (obstructive sleep apnea)    Dr Gwenette Greet initially: [name] couldn't tolerate CPAP.  Re-eval 12/2017 re-confirmed severe OSA, CPAP at 11 recommended.  . Ovarian mass    resected 07/12/10  . RLS (restless legs syndrome)   . Type II or unspecified type diabetes mellitus without mention of complication, uncontrolled 05/15/2012    Past Surgical History:  Procedure Laterality Date  . BILATERAL SALPINGOOPHORECTOMY  07/12/2010   enlarged ovary  . CARDIOVASCULAR STRESS TEST  04/04/2016   Low risk stress nuclear study with prior inferior infarct and mild peri-infarct ischemia; EF 55% with mild hypokinesis of the inferior wall.  F/u cath was NORMAL, so this is suspected to be a false pos stress test (diaphragmatic attenuation)  . G 2 P 2    . LEFT HEART CATH AND CORONARY ANGIOGRAPHY N/A  04/20/2016   Normal coronaries.  EF 55-65%, normal LV function. (Stress test prior to this was false pos due to diaphragmatic attenuation). Procedure: Left Heart Cath and Coronary Angiography;  Surgeon: Peter M Martinique, MD;  Location: [facility];  Service: Cardiovascular;  Laterality: N/A;  . MASTECTOMY  06/21/2010   Left ; [facility]    Family History  Adopted: Yes  Problem Relation Age of  Onset  . Healthy Son        73  . Healthy Daughter        76     Current Outpatient Medications:  .  ADVAIR DISKUS 500-50 MCG/DOSE AEPB, , Disp: , Rfl:  .  albuterol (PROVENTIL) (2.5 MG/3ML) 0.083% nebulizer solution, Take 3 mLs (2.5 mg total) by nebulization every 6 (six) hours as needed for wheezing or shortness of breath., Disp: 75 mL, Rfl: 12 .  albuterol (VENTOLIN HFA) 108 (90 Base) MCG/ACT inhaler, INHALE 1 PUFF BY MOUTH EVERY 6 HOURS AS NEEDED FOR SHORTNESS OR BREATH AND WHEEZING, Disp: 8.5 g, Rfl: 0 .  atorvastatin (LIPITOR) 40 MG tablet, TAKE ONE TABLET BY MOUTH DAILY, Disp: 90 tablet, Rfl: 1 .  CALCIUM-VITAMIN D PO, Take by mouth., Disp: , Rfl:  .  cetirizine (ZYRTEC) 10 MG tablet, Take 10 mg by mouth daily.  , Disp: , Rfl:  .  citalopram (CELEXA) 40 MG tablet, TAKE ONE TABLET BY MOUTH DAILY, Disp: 90 tablet, Rfl: 3 .  clonazePAM (KLONOPIN) 0.5 MG tablet, TAKE 1-2 TABLETS BY MOUTH EVERY EVENING AT BEDTIME AS NEEDED FOR INSOMNIA, Disp: 60 tablet, Rfl: 5 .  dextroamphetamine (DEXEDRINE SPANSULE) 10 MG 24 hr capsule, 2 caps po qAM, Disp: 60 capsule, Rfl: 0 .  Docusate Calcium (STOOL SOFTENER PO), Take by mouth., Disp: , Rfl:  .  Fluticasone-Umeclidin-Vilant (TRELEGY ELLIPTA) 200-62.5-25 MCG/INH AEPB, Inhale 1 puff into the lungs daily., Disp: 60 each, Rfl: 5 .  ibuprofen (ADVIL,MOTRIN) 200 MG tablet, Take 800 mg by mouth as needed for fever or moderate pain. , Disp: , Rfl:  .  Insulin Pen Needle (BD PEN NEEDLE NANO U/F) 32G X 4 MM MISC, Use with Victoza as directed once daily, Disp: 100 each, Rfl: 1 .  liraglutide (VICTOZA) 18 MG/3ML SOPN, INJECT 1.2 MG SUBCUTANEOUSLY DAILY, Disp: 6 mL, Rfl: 11 .  magnesium oxide (MAG-OX) 400 MG tablet, Take 400 mg by mouth daily., Disp: , Rfl:  .  Multiple Vitamin (MULTIVITAMIN) tablet, Take 1 tablet by mouth daily., Disp: , Rfl:  .  Omega-3 Fatty Acids (FISH OIL) 1200 MG CPDR, Take by mouth., Disp: , Rfl:  .  pregabalin (LYRICA) 150 MG capsule,  Take 1 capsule (150 mg total) by mouth 2 (two) times daily., Disp: 60 capsule, Rfl: 2 .  rOPINIRole (REQUIP) 0.5 MG tablet, TAKE ONE TABLET BY MOUTH DAILY, Disp: 90 tablet, Rfl: 2 .  triamterene-hydrochlorothiazide (MAXZIDE-25) 37.5-25 MG tablet, TAKE 1/2 TABLET BY MOUTH DAILY, Disp: 45 tablet, Rfl: 0 .  verapamil (CALAN) 120 MG tablet, TAKE 1 TABLET BY MOUTH TWICE A DAY, Disp: 180 tablet, Rfl: 0  EXAM:  VITALS per patient if applicable: LMP 16/12/9602 Comment: ovaries removed 2012   GENERAL: alert, oriented, appears well and in no acute distress  HEENT: atraumatic, conjunttiva clear, no obvious abnormalities on inspection of external nose and ears  NECK: normal movements of the head and neck  LUNGS: on inspection no signs of respiratory distress, breathing rate appears normal, no obvious gross SOB, gasping or wheezing  CV: no obvious cyanosis  MS: moves all visible extremities without noticeable abnormality  PSYCH/NEURO: pleasant and cooperative, no obvious depression or anxiety, speech and thought processing grossly intact  LABS: cbg today 277     Chemistry      Component Value Date/Time   NA 138 12/09/2018 1104   NA 141 01/15/2015 0911   K 4.3 12/09/2018 1104   K 3.6 01/15/2015 0911   CL 101 12/09/2018 1104   CO2 27 12/09/2018 1104   CO2 27 01/15/2015 0911   BUN 10 12/09/2018 1104   BUN 17.2 01/15/2015 0911   CREATININE 0.61 12/09/2018 1104   CREATININE 0.67 04/12/2016 1311   CREATININE 0.8 01/15/2015 0911      Component Value Date/Time   CALCIUM 10.1 12/09/2018 1104   CALCIUM 10.7 (H) 01/15/2015 0911   ALKPHOS 40 12/09/2018 1104   ALKPHOS 42 01/15/2015 0911   AST 68 (H) 12/09/2018 1104   AST 64 (H) 01/15/2015 0911   ALT 123 (H) 12/09/2018 1104   ALT 153 (H) 01/15/2015 0911   BILITOT 0.8 12/09/2018 1104   BILITOT 0.57 01/15/2015 0911     Lab Results  Component Value Date   WBC 9.2 12/09/2018   HGB 13.9 12/09/2018   HCT 40.3 12/09/2018   MCV 93.3  12/09/2018   PLT 268.0 12/09/2018   Lab Results  Component Value Date   TSH 2.37 12/09/2018   Lab Results  Component Value Date   CHOL 115 12/09/2018   HDL 34.40 (L) 12/09/2018   LDLCALC 47 12/09/2018   LDLDIRECT 70.0 10/03/2017   TRIG 165.0 (H) 12/09/2018   CHOLHDL 3 12/09/2018   Lab Results  Component Value Date   HGBA1C 7.9 (H) 12/09/2018    ASSESSMENT AND PLAN:  Discussed the following assessment and plan:  1) Acute sinusitis: augmentin 875 bid x 10d.  Saline nasal spray. Avoid antihistamines due to her tendency to have dry mucous membranes at baseline.  2) Chronic dry mucous membranes: worsened lately due to antihistamine use, INADVERTANT increased dose of lyrica (she was mistakenly taking 3 of the 141m caps bid but thought she was taking 3 of the 50 mg caps bid).  She has now run out of this med but gets RF tomorrow. Also, recent hyperglycemia/poor diabetes control playing a role in dryness/thirst.   Will check sjogren's labs at next f/u visit later this month.  3) Asthma: she is seeing Dr. MWarner Mccreedyfor f/u of this tomorrow. Latest med trial for this has been trelegy ellipta.  She asks about adding on singulair today and I agreed to send this in today.   I discussed the assessment and treatment plan with the patient. The patient was provided an opportunity to ask questions and all were answered. The patient agreed with the plan and demonstrated an understanding of the instructions.   The patient was advised to call back or seek an in-person evaluation if the symptoms worsen or if the condition fails to improve as anticipated.  F/u: Has appt for routine f/u set for 06/09/19  Signed:  PCrissie Sickles MD           05/21/2019

## 2019-05-22 ENCOUNTER — Ambulatory Visit: Payer: Managed Care, Other (non HMO) | Admitting: Pulmonary Disease

## 2019-05-28 ENCOUNTER — Other Ambulatory Visit: Payer: Self-pay | Admitting: Family Medicine

## 2019-06-01 ENCOUNTER — Encounter (HOSPITAL_COMMUNITY): Payer: Self-pay | Admitting: Emergency Medicine

## 2019-06-01 ENCOUNTER — Other Ambulatory Visit: Payer: Self-pay

## 2019-06-01 ENCOUNTER — Emergency Department (HOSPITAL_COMMUNITY)
Admission: EM | Admit: 2019-06-01 | Discharge: 2019-06-01 | Discharge status: Against Medical Advice | Payer: Managed Care, Other (non HMO) | Attending: Emergency Medicine | Admitting: Emergency Medicine

## 2019-06-01 DIAGNOSIS — Z5321 Procedure and treatment not carried out due to patient leaving prior to being seen by health care provider: Secondary | ICD-10-CM | POA: Insufficient documentation

## 2019-06-01 DIAGNOSIS — I10 Essential (primary) hypertension: Secondary | ICD-10-CM | POA: Diagnosis present

## 2019-06-01 NOTE — ED Triage Notes (Signed)
Patient states that her blood pressure has been high today. Patient has been checking her pressure at home and getting readings of 331 systolic. Patient is also having a headache and some blurry vision x a few days. Patient denies any weakness. Speech is normal.

## 2019-06-01 NOTE — ED Notes (Signed)
Patient's blood pressure here was within normal range. BP checked x 2. Patient stated that her battery was low on her machine at home. Patient stated that the headache was normal for her and that she has dry eye but has never had it effect her vision. Patient wanted to know if she should leave. I told patient that I could not advise her to leave. I told patient that if her head was hurting and she had blurry vision that she may want to consider staying. Patient consulted with her husband and decided to leave. Patient left after triage.

## 2019-06-06 ENCOUNTER — Other Ambulatory Visit: Payer: Self-pay

## 2019-06-09 ENCOUNTER — Ambulatory Visit: Payer: Self-pay | Admitting: Family Medicine

## 2019-06-09 DIAGNOSIS — Z0289 Encounter for other administrative examinations: Secondary | ICD-10-CM

## 2019-06-09 NOTE — Progress Notes (Deleted)
OFFICE VISIT  06/09/2019   CC: No chief complaint on file.  HPI:    Patient is a 54 y.o. Caucasian female who presents for 3 mo f/u DM, HTN, NASH, fibromyalgia, adult ADD.  Last routine f/u was 03/03/19 telemed visit. A/P as of last visit: "1) DOE/SOB: suspect non-wheezing asthma. Improved some with recent switch to trelegy ellipta. Hopefully she'll be able to decrease use of rescue albuterol soon. I do think she has some obesity hypoventilation syndrome playing a role in her symptoms. Insurer would not approve CT chest for further eval of interstitial lung dz. In future, I'm considering ? ESR, CRP?, ACE level?  2) Fibromyalgia syndrome: pretty stable but by no means in "remission". No changes in meds at this time.  Continue lyrica 139m bid.  3) Tachypalpitations: stable on verapamil 1214mbid. These occur more when she has her asthmatic sx's."  Interim hx: I saw her about 3 wks ago for acute sinusitis, also added singulair to her asthma/allergy regimen at that time.   PMP AWARE reviewed today: most recent rx for *** was filled ***, # ***, rx by ***. No red flags.   Past Medical History:  Diagnosis Date  . ADD (attention deficit disorder with hyperactivity)   . Allergic rhinitis   . Anxiety   . Atypical chest pain    Myocardial perf imaging 04/04/16: Low risk stress nuclear study with prior inferior infarct and mild peri-infarct ischemia; EF 55 with mild hypokinesis of the inferior wall.  Cath normal, so stress test was FALSE POS.  . Bowel obstruction ([place]14-2012   post mastectomy  . Breast cancer ([facility]2012   left mastectomy and chemotherapy  . Cholelithiasis 03/2016   u/s: no signs of cholecystitis  . Depression   . Fibromyalgia syndrome   . GERD (gastroesophageal reflux disease)   . History of chemotherapy    2012 completed  . Hyperlipidemia   . Hypertension   . Mild persistent asthma    dx'd age [age]Dr. ClGwenette Greethx of noncompliance with controller  therapy due to cost. PFTs 01/2019->mild small airways obst dz, no resp to bronchodil. Trelegy started 02/2019 by Dr. OlAnder Slade. Morbid obesity ([place]  . NASH (nonalcoholic steatohepatitis) 02/2010   u/s confirmed 02/2010 (hx of transaminitis).  Repeat u/s 03/2016 showed no sign of cirrhosis or liver mass, just moderate hepatic steatosis. AST/ALT stable 05/2018.  . Marland Kitcheneuropathy of both upper extremities    and both feet  . OSA (obstructive sleep apnea)    Dr ClGwenette Greetnitially: pt couldn't tolerate CPAP.  Re-eval 12/2017 re-confirmed severe OSA, CPAP at 11 recommended.  . Ovarian mass    resected 07/12/10  . RLS (restless legs syndrome)   . Type II or unspecified type diabetes mellitus without mention of complication, uncontrolled 05/15/2012    Past Surgical History:  Procedure Laterality Date  . BILATERAL SALPINGOOPHORECTOMY  07/12/2010   enlarged ovary  . CARDIOVASCULAR STRESS TEST  04/04/2016   Low risk stress nuclear study with prior inferior infarct and mild peri-infarct ischemia; EF 55% with mild hypokinesis of the inferior wall.  F/u cath was NORMAL, so this is suspected to be a false pos stress test (diaphragmatic attenuation)  . G 2 P 2    . LEFT HEART CATH AND CORONARY ANGIOGRAPHY N/A 04/20/2016   Normal coronaries.  EF 55-65%, normal LV function. (Stress test prior to this was false pos due to diaphragmatic attenuation). Procedure: Left Heart Cath and Coronary Angiography;  Surgeon: PeAnder Slade  Martinique, MD;  Location: [facility];  Service: Cardiovascular;  Laterality: N/A;  . MASTECTOMY  06/21/2010   Left ; Dr [facility]    Outpatient Medications Prior to Visit  Medication Sig Dispense Refill  . ADVAIR DISKUS 500-50 MCG/DOSE AEPB     . albuterol (PROVENTIL) (2.5 MG/3ML) 0.083% nebulizer solution Take 3 mLs (2.5 mg total) by nebulization every 6 (six) hours as needed for wheezing or shortness of breath. 75 mL 12  . albuterol (VENTOLIN HFA) 108 (90 Base) MCG/ACT inhaler INHALE 1  PUFF BY MOUTH EVERY 6 HOURS AS NEEDED FOR SHORTNESS OR BREATH AND WHEEZING 8.5 g 0  . amoxicillin-clavulanate (AUGMENTIN) 875-125 MG tablet Take 1 tablet by mouth 2 (two) times daily. 20 tablet 0  . atorvastatin (LIPITOR) 40 MG tablet TAKE ONE TABLET BY MOUTH DAILY 90 tablet 1  . CALCIUM-VITAMIN D PO Take by mouth.    . cetirizine (ZYRTEC) 10 MG tablet Take 10 mg by mouth daily.      . citalopram (CELEXA) 40 MG tablet TAKE ONE TABLET BY MOUTH DAILY 90 tablet 3  . clonazePAM (KLONOPIN) 0.5 MG tablet TAKE 1-2 TABLETS BY MOUTH EVERY EVENING AT BEDTIME AS NEEDED FOR INSOMNIA 60 tablet 5  . dextroamphetamine (DEXEDRINE SPANSULE) 10 MG 24 hr capsule 2 caps po qAM 60 capsule 0  . Docusate Calcium (STOOL SOFTENER PO) Take by mouth.    . Fluticasone-Umeclidin-Vilant (TRELEGY ELLIPTA) 200-62.5-25 MCG/INH AEPB Inhale 1 puff into the lungs daily. 60 each 5  . ibuprofen (ADVIL,MOTRIN) 200 MG tablet Take 800 mg by mouth as needed for fever or moderate pain.     . Insulin Pen Needle (BD PEN NEEDLE NANO U/F) 32G X 4 MM MISC Use with Victoza as directed once daily 100 each 1  . liraglutide (VICTOZA) 18 MG/3ML SOPN INJECT 1.2 MG SUBCUTANEOUSLY DAILY 6 mL 11  . magnesium oxide (MAG-OX) 400 MG tablet Take 400 mg by mouth daily.    . montelukast (SINGULAIR) 10 MG tablet Take 1 tablet (10 mg total) by mouth at bedtime. 30 tablet 3  . Multiple Vitamin (MULTIVITAMIN) tablet Take 1 tablet by mouth daily.    . Omega-3 Fatty Acids (FISH OIL) 1200 MG CPDR Take by mouth.    . pregabalin (LYRICA) 150 MG capsule Take 1 capsule (150 mg total) by mouth 2 (two) times daily. 60 capsule 2  . rOPINIRole (REQUIP) 0.5 MG tablet TAKE ONE TABLET BY MOUTH DAILY 90 tablet 2  . triamterene-hydrochlorothiazide (MAXZIDE-25) 37.5-25 MG tablet TAKE 1/2 TABLET BY MOUTH DAILY 45 tablet 0  . verapamil (CALAN) 120 MG tablet TAKE ONE TABLET BY MOUTH TWICE A DAY 180 tablet 0   No facility-administered medications prior to visit.    Allergies   Allergen Reactions  . Metoprolol Other (See Comments)    REACTION: " chest pain"  . Sulfa Antibiotics Nausea And Vomiting    ROS As per HPI  PE: Last menstrual period 07/14/2010. ***  LABS:  ***  IMPRESSION AND PLAN:  No problem-specific Assessment & Plan notes found for this encounter.   An After Visit Summary was printed and given to the patient.  FOLLOW UP: No follow-ups on file.

## 2019-06-16 ENCOUNTER — Encounter: Payer: Self-pay | Admitting: Family Medicine

## 2019-06-17 ENCOUNTER — Telehealth: Payer: Self-pay

## 2019-06-17 ENCOUNTER — Other Ambulatory Visit: Payer: Self-pay | Admitting: Family Medicine

## 2019-06-17 NOTE — Telephone Encounter (Signed)
Patient is scheduled tomorrow with PCP.

## 2019-06-17 NOTE — Telephone Encounter (Signed)
[facility] - Client TELEPHONE ADVICE RECORD [facility] Patient Name: [name] Gender: Female DOB: 1965-11-14 Age: 54 Y 10 M 18 D Return Phone Number: [phone] (Primary) Address: City/State/Zip: [place] [address] Client [facility] - Client Client Site [place] - Day Physician Crissie Sickles - MD Contact Type Call Who Is Calling Patient / Member / Family / Caregiver Call Type Triage / Clinical Relationship To Patient Self Return Phone Number [phone] (Primary) Chief Complaint BREATHING - shortness of breath or sounds breathless Reason for Call Symptomatic / Request for [name] states she is from [facility]. The patient is scheduled for COVID vaccine at 3 PM and has a sinus infection. She thinks she has a lung infection. She quit smoiking in June and has had trouble breathing since. Translation No Nurse Assessment Nurse: Neena Rhymes, RN, Sharyn Lull Date/Time (Eastern Time): 06/17/2019 11:56:20 AM Confirm and document reason for call. If symptomatic, describe symptoms. ---The patient is scheduled for COVID vaccine at 3 PM and has a sinus infection was treated for one last week and now it is coming back. She thinks she has a lung infection. She quit smoking in June and has had trouble breathing since. Uses nebulizer and albuteral inhaler. Pt finished antibiotics 4-5 days ago. Has the patient had close contact with a person known or suspected to have the novel coronavirus illness OR traveled / lives in area with major community spread (including international travel) in the last 14 days from the onset of symptoms? * If Asymptomatic, screen for exposure and travel within the last 14 days. ---No Does the patient have any new or worsening symptoms? ---Yes Will a triage be completed? ---Yes Related visit to physician within the last 2 weeks? ---No Does the PT have  any chronic conditions? (i.e. diabetes, asthma, this includes High risk factors for pregnancy, etc.) ---Yes List chronic conditions. ---DM Asthma High CHO HBP Is the patient pregnant or possibly pregnant? (Ask all females between the ages of 31-55) ---No Is this a behavioral health or substance abuse call? ---NoPLEASE NOTE: All timestamps contained within this report are represented as [place] Standard Time. CONFIDENTIALTY NOTICE: This fax transmission is intended only for the addressee. It contains information that is legally privileged, confidential or otherwise protected from use or disclosure. If you are not the intended recipient, you are strictly prohibited from reviewing, disclosing, copying using or disseminating any of this information or taking any action in reliance on or regarding this information. If you have received this fax in error, please notify us immediately by telephone so that we can arrange for its return to [place]. Phone: [phone], Toll-Free: [phone], Fax: [phone] Page: 2 of 2 Call Id: [id number] Guidelines Guideline Title Affirmed Question Affirmed Notes Nurse Date/Time Eilene Ghazi Time) Sinus Infection on Antibiotic Follow-up Call [1] Taking antibiotic > 72 hours (3 days) AND [2] sinus pain not improved Neena Rhymes, RN, Sharyn Lull 06/17/2019 12:00:57 PM Disp. Time Eilene Ghazi Time) Disposition Final User 06/17/2019 11:53:15 AM Send to Urgent Natale Milch, Stefanie 06/17/2019 12:06:04 PM See PCP within 24 Hours Yes Neena Rhymes, RN, Julio Sicks Disagree/Comply Comply Caller Understands Yes PreDisposition Call Doctor Care Advice Given Per Guideline HOW TO MAKE SALINE (SALT WATER) NASAL [name]: * You become worse. Comments User: [name], RN Date/Time Eilene Ghazi Time): 06/17/2019 12:05:32 PM Advised caller to call [facility] where she is supposed to get COVID vaccine. Caller verbalizes understanding. User: [name], RN Date/Time Eilene Ghazi Time): 06/17/2019  12:05:51 PM Caller has appointment with PCP tomorrow. Referrals REFERRED TO PCP OFFICE

## 2019-06-17 NOTE — Telephone Encounter (Signed)
Patient feels like she has sinus infection and would like to know if it is okay to get her COVID vaccine at 3:00 this afternoon. Transferred patient to nurse triage.

## 2019-06-18 ENCOUNTER — Ambulatory Visit (INDEPENDENT_AMBULATORY_CARE_PROVIDER_SITE_OTHER): Payer: Managed Care, Other (non HMO) | Admitting: Family Medicine

## 2019-06-18 ENCOUNTER — Telehealth: Payer: Self-pay

## 2019-06-18 ENCOUNTER — Encounter: Payer: Self-pay | Admitting: Family Medicine

## 2019-06-18 ENCOUNTER — Other Ambulatory Visit: Payer: Self-pay | Admitting: Family Medicine

## 2019-06-18 ENCOUNTER — Other Ambulatory Visit: Payer: Self-pay

## 2019-06-18 VITALS — Wt 270.0 lb

## 2019-06-18 DIAGNOSIS — R7402 Elevation of levels of lactic acid dehydrogenase (LDH): Secondary | ICD-10-CM

## 2019-06-18 DIAGNOSIS — J329 Chronic sinusitis, unspecified: Secondary | ICD-10-CM

## 2019-06-18 DIAGNOSIS — F988 Other specified behavioral and emotional disorders with onset usually occurring in childhood and adolescence: Secondary | ICD-10-CM

## 2019-06-18 DIAGNOSIS — M797 Fibromyalgia: Secondary | ICD-10-CM

## 2019-06-18 DIAGNOSIS — R7401 Elevation of levels of liver transaminase levels: Secondary | ICD-10-CM

## 2019-06-18 DIAGNOSIS — E1140 Type 2 diabetes mellitus with diabetic neuropathy, unspecified: Secondary | ICD-10-CM

## 2019-06-18 DIAGNOSIS — E7800 Pure hypercholesterolemia, unspecified: Secondary | ICD-10-CM | POA: Diagnosis not present

## 2019-06-18 DIAGNOSIS — F330 Major depressive disorder, recurrent, mild: Secondary | ICD-10-CM

## 2019-06-18 DIAGNOSIS — F411 Generalized anxiety disorder: Secondary | ICD-10-CM

## 2019-06-18 MED ORDER — PREGABALIN 150 MG PO CAPS: 150.0000 mg | ORAL_CAPSULE | Freq: Two times a day (BID) | ORAL | 5 refills | Status: DC

## 2019-06-18 MED ORDER — DEXTROAMPHETAMINE SULFATE ER 10 MG PO CP24: ORAL_CAPSULE | ORAL | 0 refills | Status: DC

## 2019-06-18 MED ORDER — BUDESONIDE-FORMOTEROL FUMARATE 160-4.5 MCG/ACT IN AERO: 2.0000 | INHALATION_SPRAY | Freq: Two times a day (BID) | RESPIRATORY_TRACT | 11 refills | Status: DC

## 2019-06-18 NOTE — Progress Notes (Signed)
Virtual Visit via Video Note  I connected with pt on 06/18/19 at  2:30 PM EDT by a video enabled telemedicine application and verified that I am speaking with the correct person using two identifiers.  Location patient: home Location provider:work or home office Persons participating in the virtual visit: patient, provider  I discussed the limitations of evaluation and management by telemedicine and the availability of in person appointments. The patient expressed understanding and agreed to proceed.  Telemedicine visit is a necessity given the COVID-19 restrictions in place at the current time.  HPI: 54 y/o WF with hx of fibromyalgia syndrome who is being seen today for 1 mo f/u poorly controlled DM 2 with DPN, mod pers asthma, and adult ADD.   PMP AWARE reviewed today: most recent rx for dextroamph ER 68m was filled 05/15/19, # 659 rx by me. Most recent rx fill of lyrica 1532mwas 05/21/19, #60, rx by me. Most recent rx fill of clonaz 0.35m46mas 05/12/19, #60, rx by me. No red flags.  Interim hx: I saw her 05/21/19 (virtual) and dx'd recurrent acute sinusitis and rx'd augmentin 8735m68md x 10d. She thought sx's/infection had returned after she finished abx.  However these sx's resolved on their own. Her breathing has signif improved her breathing since the abx!  Still with chronic mucous production.  No fevers. She still has to use the nebulizer/albut HFA three times per day.  She stopped her trelegy 1.5 wks ago, noted no diff on/off it, says she doesn't want to take anymore powder based inhaled med---they all cause ST/increased mouth dryness and discomfort.    DM: not monitoring glucoses at home.  Taking victoza as rx'd. Fibromyalgia: stable on lyrica 150 mg bid.  ADD: Pt states all is going well with the med at current dosing: much improved focus, concentration, task completion.  Less frustration, better multitasking, less impulsivity and restlessness.  Mood is stable. No side effects  from the medication.  Anxiety/dep: stable on citalopram and clonazepam.  ROS: See pertinent positives and negatives per HPI.  Past Medical History:  Diagnosis Date  . ADD (attention deficit disorder with hyperactivity)   . Allergic rhinitis   . Anxiety   . Atypical chest pain    Myocardial perf imaging 04/04/16: Low risk stress nuclear study with prior inferior infarct and mild peri-infarct ischemia; EF 55 with mild hypokinesis of the inferior wall.  Cath normal, so stress test was FALSE POS.  . Bowel obstruction (HCC)[place]14-2012   post mastectomy  . Breast cancer (HCC)[place]12   left mastectomy and chemotherapy  . Cholelithiasis 03/2016   u/s: no signs of cholecystitis  . Depression   . Fibromyalgia syndrome   . GERD (gastroesophageal reflux disease)   . History of chemotherapy    2012 completed  . Hyperlipidemia   . Hypertension   . Mild persistent asthma    dx'd age 50 (54. ClanGwenette Greet of noncompliance with controller therapy due to cost. PFTs 01/2019->mild small airways obst dz, no resp to bronchodil. Trelegy started 02/2019 by Dr. OlalAnder SladeMorbid obesity (HCC)[place]. NASH (nonalcoholic steatohepatitis) 02/2010   u/s confirmed 02/2010 (hx of transaminitis).  Repeat u/s 03/2016 showed no sign of cirrhosis or liver mass, just moderate hepatic steatosis. AST/ALT stable 05/2018.  . Ne[place] Kitchenropathy of both upper extremities    and both feet  . OSA (obstructive sleep apnea)    Dr ClanGwenette Greettially: pt couldn't tolerate CPAP.  Re-eval 12/2017 re-confirmed severe OSA, CPAP  at 11 recommended.  . Ovarian mass    resected 07/12/10  . RLS (restless legs syndrome)   . Type II or unspecified type diabetes mellitus without mention of complication, uncontrolled 05/15/2012    Past Surgical History:  Procedure Laterality Date  . BILATERAL SALPINGOOPHORECTOMY  07/12/2010   enlarged ovary  . CARDIOVASCULAR STRESS TEST  04/04/2016   Low risk stress nuclear study with prior inferior infarct and mild  peri-infarct ischemia; EF 55% with mild hypokinesis of the inferior wall.  F/u cath was NORMAL, so this is suspected to be a false pos stress test (diaphragmatic attenuation)  . G 2 P 2    . LEFT HEART CATH AND CORONARY ANGIOGRAPHY N/A 04/20/2016   Normal coronaries.  EF 55-65%, normal LV function. (Stress test prior to this was false pos due to diaphragmatic attenuation). Procedure: Left Heart Cath and Coronary Angiography;  Surgeon: Peter M Martinique, MD;  Location: [facility];  Service: Cardiovascular;  Laterality: N/A;  . MASTECTOMY  06/21/2010   Left ; Dr [facility]    Family History  Adopted: Yes  Problem Relation Age of Onset  . Healthy Son        34  . Healthy Daughter        5     Current Outpatient Medications:  .  ADVAIR DISKUS 500-50 MCG/DOSE AEPB, , Disp: , Rfl:  .  albuterol (PROVENTIL) (2.5 MG/3ML) 0.083% nebulizer solution, Take 3 mLs (2.5 mg total) by nebulization every 6 (six) hours as needed for wheezing or shortness of breath., Disp: 75 mL, Rfl: 12 .  albuterol (VENTOLIN HFA) 108 (90 Base) MCG/ACT inhaler, INHALE ONE PUFF BY MOUTH EVERY 6 HOURS AS NEEDED FOR SHORTNESS OF BREATH AND WHEEZING, Disp: 18 g, Rfl: 1 .  atorvastatin (LIPITOR) 40 MG tablet, TAKE ONE TABLET BY MOUTH DAILY, Disp: 90 tablet, Rfl: 1 .  CALCIUM-VITAMIN D PO, Take by mouth., Disp: , Rfl:  .  cetirizine (ZYRTEC) 10 MG tablet, Take 10 mg by mouth daily.  , Disp: , Rfl:  .  citalopram (CELEXA) 40 MG tablet, TAKE ONE TABLET BY MOUTH DAILY, Disp: 90 tablet, Rfl: 3 .  clonazePAM (KLONOPIN) 0.5 MG tablet, TAKE 1-2 TABLETS BY MOUTH EVERY EVENING AT BEDTIME AS NEEDED FOR INSOMNIA, Disp: 60 tablet, Rfl: 5 .  dextroamphetamine (DEXEDRINE SPANSULE) 10 MG 24 hr capsule, 2 caps po qAM, Disp: 60 capsule, Rfl: 0 .  ibuprofen (ADVIL,MOTRIN) 200 MG tablet, Take 800 mg by mouth as needed for fever or moderate pain. , Disp: , Rfl:  .  Insulin Pen Needle (BD PEN NEEDLE NANO U/F) 32G X 4 MM MISC, Use with  Victoza as directed once daily, Disp: 100 each, Rfl: 1 .  liraglutide (VICTOZA) 18 MG/3ML SOPN, INJECT 1.2 MG SUBCUTANEOUSLY DAILY, Disp: 6 mL, Rfl: 11 .  magnesium oxide (MAG-OX) 400 MG tablet, Take 400 mg by mouth daily., Disp: , Rfl:  .  montelukast (SINGULAIR) 10 MG tablet, Take 1 tablet (10 mg total) by mouth at bedtime., Disp: 30 tablet, Rfl: 3 .  Multiple Vitamin (MULTIVITAMIN) tablet, Take 1 tablet by mouth daily., Disp: , Rfl:  .  Omega-3 Fatty Acids (FISH OIL) 1200 MG CPDR, Take by mouth., Disp: , Rfl:  .  pregabalin (LYRICA) 150 MG capsule, Take 1 capsule (150 mg total) by mouth 2 (two) times daily., Disp: 60 capsule, Rfl: 2 .  rOPINIRole (REQUIP) 0.5 MG tablet, TAKE ONE TABLET BY MOUTH DAILY, Disp: 90 tablet, Rfl: 2 .  triamterene-hydrochlorothiazide (MAXZIDE-25) 37.5-25 MG tablet, TAKE 1/2 TABLET BY MOUTH DAILY, Disp: 45 tablet, Rfl: 0 .  verapamil (CALAN) 120 MG tablet, TAKE ONE TABLET BY MOUTH TWICE A DAY, Disp: 180 tablet, Rfl: 0 .  Docusate Calcium (STOOL SOFTENER PO), Take by mouth., Disp: , Rfl:  .  Fluticasone-Umeclidin-Vilant (TRELEGY ELLIPTA) 200-62.5-25 MCG/INH AEPB, Inhale 1 puff into the lungs daily. (Patient not taking: Reported on 06/18/2019), Disp: 60 each, Rfl: 5  EXAM:  VITALS per patient if applicable: Wt 270 lb (122.5 kg)   LMP 07/14/2010 Comment: ovaries removed 2012  BMI 46.35 kg/m    GENERAL: alert, oriented, appears well and in no acute distress  HEENT: atraumatic, conjunttiva clear, no obvious abnormalities on inspection of external nose and ears  NECK: normal movements of the head and neck  LUNGS: on inspection no signs of respiratory distress, breathing rate appears normal, no obvious gross SOB, gasping or wheezing  CV: no obvious cyanosis  MS: moves all visible extremities without noticeable abnormality  PSYCH/NEURO: pleasant and cooperative, no obvious depression or anxiety, speech and thought processing grossly intact  LABS: none today  Lab  Results  Component Value Date   TSH 2.37 12/09/2018   Lab Results  Component Value Date   WBC 9.2 12/09/2018   HGB 13.9 12/09/2018   HCT 40.3 12/09/2018   MCV 93.3 12/09/2018   PLT 268.0 12/09/2018   Lab Results  Component Value Date   CREATININE 0.61 12/09/2018   BUN 10 12/09/2018   NA 138 12/09/2018   K 4.3 12/09/2018   CL 101 12/09/2018   CO2 27 12/09/2018   Lab Results  Component Value Date   ALT 123 (H) 12/09/2018   AST 68 (H) 12/09/2018   ALKPHOS 40 12/09/2018   BILITOT 0.8 12/09/2018   Lab Results  Component Value Date   CHOL 115 12/09/2018   Lab Results  Component Value Date   HDL 34.40 (L) 12/09/2018   Lab Results  Component Value Date   LDLCALC 47 12/09/2018   Lab Results  Component Value Date   TRIG 165.0 (H) 12/09/2018   Lab Results  Component Value Date   CHOLHDL 3 12/09/2018   Lab Results  Component Value Date   HGBA1C 7.9 (H) 12/09/2018    ASSESSMENT AND PLAN:  Discussed the following assessment and plan:  1) Recurrent acute sinusitis: resolved. Has some chronic rhinitis. Nothing new for this today.  2) Mod persistent asthma: seems to need albuterol tid still although describes her breathing as being much improved since the augmentin relatively recently. No DPI's anymore->doesn't tolerate them.  Trelegy and advair no help. We'll try symbicort 160/4.5. I encouraged her to f/u with Pulmonologist, Dr. Ander Slade, at least one more time.  3) DM 2: victoza compliant but no home monitoring of glucoses. HbA1c ordered future. Lytes/cr future.  4) Hx of transaminasemia: suspect fatty liver/NASH. Viral hep serologist neg. Hepatic panel future.  5) Fibromyalgia syndrome: stable. Continue lyrica 137m bid. CSC and UDS UTD.  6) Recurrent MDD and GAD: The current medical regimen is effective;  continue present plan and medications. CSC and UDS UTD.  7) Adult ADD: The current medical regimen is effective;  continue present plan and  medications. I did electronic rx's for dexedrin ER 112m 2 qAM, #60 today for each of the next 3 months.  Appropriate fill on/after date was noted on each rx.  I discussed the assessment and treatment plan with the patient. The patient was provided an opportunity to ask  questions and all were answered. The patient agreed with the plan and demonstrated an understanding of the instructions.   The patient was advised to call back or seek an in-person evaluation if the symptoms worsen or if the condition fails to improve as anticipated.  F/u: 3 mo  Signed:  Crissie Sickles, MD           06/18/2019

## 2019-06-18 NOTE — Addendum Note (Signed)
Addended by: Tammi Sou on: 06/18/2019 04:09 PM   Modules accepted: Orders

## 2019-06-18 NOTE — Telephone Encounter (Signed)
PA sent via covermymed on 06/18/19   Key: [id number]    Medication: Dextroamphetamine   Dx: F90.9   Per Dr. [name] pt has tried and failed N/A   Waiting for response.

## 2019-06-18 NOTE — Telephone Encounter (Signed)
RF request for Triamterene-HCTZ LOV:05/21/19 Next ov: 06/18/19 Last written:03/10/19(45,0)  RF request for Ropinirole LOV:05/21/19 Next ov: 06/18/19 Last written:09/06/18(90,2)  Medications pending, pt has appt today. Please advise.

## 2019-06-19 ENCOUNTER — Other Ambulatory Visit: Payer: Self-pay | Admitting: Family Medicine

## 2019-06-19 NOTE — Telephone Encounter (Signed)
Refills sent

## 2019-06-19 NOTE — Telephone Encounter (Signed)
Patient called asking about refill for dextroamphetamine. She was made aware a PA was needed and is in process.

## 2019-06-20 ENCOUNTER — Other Ambulatory Visit: Payer: Self-pay

## 2019-06-20 ENCOUNTER — Ambulatory Visit (INDEPENDENT_AMBULATORY_CARE_PROVIDER_SITE_OTHER): Payer: Managed Care, Other (non HMO)

## 2019-06-20 DIAGNOSIS — E1140 Type 2 diabetes mellitus with diabetic neuropathy, unspecified: Secondary | ICD-10-CM

## 2019-06-20 DIAGNOSIS — E7800 Pure hypercholesterolemia, unspecified: Secondary | ICD-10-CM | POA: Diagnosis not present

## 2019-06-21 LAB — COMPREHENSIVE METABOLIC PANEL
AG Ratio: 1.4 (calc) (ref 1.0–2.5)
ALT: 82 U/L — ABNORMAL HIGH (ref 6–29)
AST: 43 U/L — ABNORMAL HIGH (ref 10–35)
Albumin: 4.1 g/dL (ref 3.6–5.1)
Alkaline phosphatase (APISO): 45 U/L (ref 37–153)
BUN: 12 mg/dL (ref 7–25)
CO2: 26 mmol/L (ref 20–32)
Calcium: 10 mg/dL (ref 8.6–10.4)
Chloride: 101 mmol/L (ref 98–110)
Creat: 0.68 mg/dL (ref 0.50–1.05)
Globulin: 2.9 g/dL (calc) (ref 1.9–3.7)
Glucose, Bld: 201 mg/dL — ABNORMAL HIGH (ref 65–99)
Potassium: 4.3 mmol/L (ref 3.5–5.3)
Sodium: 139 mmol/L (ref 135–146)
Total Bilirubin: 0.3 mg/dL (ref 0.2–1.2)
Total Protein: 7 g/dL (ref 6.1–8.1)

## 2019-06-21 LAB — HEMOGLOBIN A1C
Hgb A1c MFr Bld: 7.8 % of total Hgb — ABNORMAL HIGH (ref <5.7)
Mean Plasma Glucose: 177 (calc)
eAG (mmol/L): 9.8 (calc)

## 2019-06-23 ENCOUNTER — Encounter: Payer: Self-pay | Admitting: Family Medicine

## 2019-06-24 ENCOUNTER — Other Ambulatory Visit: Payer: Self-pay

## 2019-06-24 MED ORDER — PIOGLITAZONE HCL 15 MG PO TABS: 15.0000 mg | ORAL_TABLET | Freq: Every day | ORAL | 3 refills | Status: DC

## 2019-06-24 NOTE — Telephone Encounter (Signed)
Patient husband called back regarding denied PA. (DPR) He is not happy.    He is requesting prescription to be sent to pharmacy and he will pay cash price.  Please follow up with patient [phone]

## 2019-06-24 NOTE — Telephone Encounter (Signed)
PA was denied, pt notified.

## 2019-06-24 NOTE — Telephone Encounter (Signed)
Pt contacted and advised her medication is available at the pharmacy but will have to pay cash/full price.

## 2019-07-01 ENCOUNTER — Other Ambulatory Visit: Payer: Self-pay | Admitting: Family Medicine

## 2019-07-04 ENCOUNTER — Telehealth: Payer: Self-pay

## 2019-07-04 MED ORDER — DEXTROAMPHETAMINE SULFATE ER 15 MG PO CP24: 15.0000 mg | ORAL_CAPSULE | Freq: Every day | ORAL | 0 refills | Status: DC

## 2019-07-04 NOTE — Telephone Encounter (Signed)
OK, I'll change to the 13m tab once a day. New rx sent.

## 2019-07-04 NOTE — Telephone Encounter (Signed)
LM for pt to returncall

## 2019-07-04 NOTE — Telephone Encounter (Signed)
Pt.notified

## 2019-07-04 NOTE — Telephone Encounter (Signed)
Please call regarding the following meds  ---Dextrostat  and Dextroamphetamine      Please call  [phone].

## 2019-07-04 NOTE — Telephone Encounter (Signed)
Patient contacted to get more info regarding dextrostat and dextroamphetamine. PA done 06/18/19 to get dextroamphetamine covered but was denied. Pt called back and let me know insurance will cover dextroamphetamine for 1 cap qd but not 2 or dextrostat 26m. Pt was made aware may need appt to discuss.  Please advise, thanks.

## 2019-07-28 ENCOUNTER — Telehealth: Payer: Self-pay

## 2019-07-28 ENCOUNTER — Encounter: Payer: Self-pay | Admitting: Family Medicine

## 2019-07-28 MED ORDER — FLUTICASONE PROPIONATE 0.05 % EX CREA: TOPICAL_CREAM | CUTANEOUS | 1 refills | Status: DC

## 2019-07-28 NOTE — Telephone Encounter (Signed)
Patient called pharmacy to get refill on cream she used for Eczema. They told her they had nothing on file.  She called here. She does not know of medication.  She said Dr. [name] had prescribed a cream to help relieve itching....about 4 years ago. She thought it started with a "t".   I could not find a cream that started with a "t".  Patient states that she scratches her skin until it bleeds. And really needs this prescription.   Patient called back as I was finishing up with this message and said she thought it was   triamcinolone cream ??  Please advise

## 2019-07-28 NOTE — Telephone Encounter (Signed)
Made Patient a office visit. Nothing else further needed.

## 2019-07-28 NOTE — Telephone Encounter (Signed)
Patient sent mychart message this morning inquiring about getting a prescription for the cream and a response was given advising her an appointment would be needed. No record of name provided for cream in her chart. Second [employer] sent advising again that an appointment would be needed. If patient would like appt, please assist.

## 2019-07-28 NOTE — Telephone Encounter (Signed)
Cutivate eRx'd.

## 2019-07-28 NOTE — Telephone Encounter (Signed)
Patient is referring to 01/11/17 o/v note,  "Pruritis: ? Xerosis, +/- eczema. Rx'd cutivate 0.05% cream bid prn. Try to occlude the itchy areas so she cannot scratch them. When this occurs she can increase her zyrtec to 25m bid."  Please advise if something can be sent or appt needed.

## 2019-08-01 ENCOUNTER — Encounter: Payer: Self-pay | Admitting: Adult Health

## 2019-08-01 ENCOUNTER — Other Ambulatory Visit (INDEPENDENT_AMBULATORY_CARE_PROVIDER_SITE_OTHER): Payer: Managed Care, Other (non HMO)

## 2019-08-01 ENCOUNTER — Ambulatory Visit: Payer: Managed Care, Other (non HMO) | Admitting: Adult Health

## 2019-08-01 ENCOUNTER — Other Ambulatory Visit: Payer: Self-pay

## 2019-08-01 VITALS — BP 140/80 | HR 93 | Temp 98.6°F | Ht 64.0 in | Wt 275.0 lb

## 2019-08-01 DIAGNOSIS — R0609 Other forms of dyspnea: Secondary | ICD-10-CM

## 2019-08-01 DIAGNOSIS — J309 Allergic rhinitis, unspecified: Secondary | ICD-10-CM | POA: Diagnosis not present

## 2019-08-01 DIAGNOSIS — R0600 Dyspnea, unspecified: Secondary | ICD-10-CM

## 2019-08-01 DIAGNOSIS — J4530 Mild persistent asthma, uncomplicated: Secondary | ICD-10-CM | POA: Diagnosis not present

## 2019-08-01 LAB — BASIC METABOLIC PANEL
BUN: 12 mg/dL (ref 6–23)
CO2: 29 mEq/L (ref 19–32)
Calcium: 9.1 mg/dL (ref 8.4–10.5)
Chloride: 105 mEq/L (ref 96–112)
Creatinine, Ser: 0.64 mg/dL (ref 0.40–1.20)
GFR: 97.07 mL/min (ref >60.00)
Glucose, Bld: 92 mg/dL (ref 70–99)
Potassium: 4 mEq/L (ref 3.5–5.1)
Sodium: 137 mEq/L (ref 135–145)

## 2019-08-01 LAB — CBC WITH DIFFERENTIAL/PLATELET
Basophils Absolute: 0.1 10*3/uL (ref 0.0–0.1)
Basophils Relative: 0.7 % (ref 0.0–3.0)
Eosinophils Absolute: 0.1 10*3/uL (ref 0.0–0.7)
Eosinophils Relative: 1.2 % (ref 0.0–5.0)
HCT: 40.3 % (ref 36.0–46.0)
Hemoglobin: 13.6 g/dL (ref 12.0–15.0)
Lymphocytes Relative: 24.7 % (ref 12.0–46.0)
Lymphs Abs: 2.4 10*3/uL (ref 0.7–4.0)
MCHC: 33.9 g/dL (ref 30.0–36.0)
MCV: 91.9 fl (ref 78.0–100.0)
Monocytes Absolute: 0.7 10*3/uL (ref 0.1–1.0)
Monocytes Relative: 6.7 % (ref 3.0–12.0)
Neutro Abs: 6.5 10*3/uL (ref 1.4–7.7)
Neutrophils Relative %: 66.7 % (ref 43.0–77.0)
Platelets: 273 10*3/uL (ref 150.0–400.0)
RBC: 4.38 Mil/uL (ref 3.87–5.11)
RDW: 13.4 % (ref 11.5–15.5)
WBC: 9.8 10*3/uL (ref 4.0–10.5)

## 2019-08-01 LAB — BRAIN NATRIURETIC PEPTIDE: Pro B Natriuretic peptide (BNP): 40 pg/mL (ref 0.0–100.0)

## 2019-08-01 MED ORDER — BREZTRI AEROSPHERE 160-9-4.8 MCG/ACT IN AERO: 2.0000 | INHALATION_SPRAY | Freq: Two times a day (BID) | RESPIRATORY_TRACT | 0 refills | Status: DC

## 2019-08-01 NOTE — Patient Instructions (Signed)
Change Symbicort to BREZTRI 2 puffs Twice daily  , rinse after use.  Continue on Singulair and Allegra.  Albuterol As needed   Chest xray and labs today .  Follow up in 4 weeks with Dr. Karsten Fells or Asusena Sigley NP and As needed   Please contact office for sooner follow up if symptoms do not improve or worsen or seek emergency care

## 2019-08-01 NOTE — Addendum Note (Signed)
Addended by: Vanessa Barbara on: 08/01/2019 05:12 PM   Modules accepted: Orders

## 2019-08-01 NOTE — Assessment & Plan Note (Signed)
Has increased symptom burden despite aggressive maintenance regimen with Symbicort Singulair Allegra.  Lung function is normal.  We will give a trial of BREZTRI .  Check labs with CBC be met and BNP.  Check chest x-ray.  Plan  Patient Instructions  Change Symbicort to BREZTRI 2 puffs Twice daily  , rinse after use.  Continue on Singulair and Allegra.  Albuterol As needed   Chest xray and labs today .  Follow up in 4 weeks with Dr. Karsten Fells or Blayne Garlick NP and As needed   Please contact office for sooner follow up if symptoms do not improve or worsen or seek emergency care

## 2019-08-01 NOTE — Assessment & Plan Note (Signed)
Continue on current regimen.  Continue trigger prevention

## 2019-08-01 NOTE — Progress Notes (Signed)
@Patient  ID: [name], female    DOB: [birth year], 54 y.o.   MRN: [id number]  Chief Complaint  Patient presents with  . Follow-up    Asthma     Referring provider: Tammi Sou, MD  HPI: 54 year old female former smoker disease quit June 2020) followed for obstructive sleep apnea on CPAP and asthma Medical hx + for RLS , Breast Cancer 2012  s/p chemo/mastectomy (no radiation)  , DM    TEST/EVENTS :  Pulmonary function testing February 04, 2019 showed normal lung function with an FEV1 at 88%, ratio 82 , FVC 85%, no significant bronchodilator response.,  Positive mid flow reversibility, DLCO 91%.  08/01/2019 Follow up : Asthma  Patient returns for a follow-up visit.  Patient complains that over the last year she has been having increased asthma symptoms since she stopped smoking.  Feels that she gets short of breath more than usual and has intermittent cough and wheezing.  Last visit she was changed from Symbicort to Trelegy but says she cannot tolerate Trelegy as it causes irritation in her throat.  She continues on Symbicort.  But has to use her albuterol a couple times a day.  Previous pulmonary function testing in November showed normal lung function.  She says she does have some thick mucus but does not know if it is discolored or not.  She has no fever.  Covid vaccines are up-to-date. Remains on Singulair and Allegra  daily.  She denies any chest pain orthopnea PND or leg swelling.   Allergies  Allergen Reactions  . Metoprolol Other (See Comments)    REACTION: " chest pain"  . Sulfa Antibiotics Nausea And Vomiting    Immunization History  Administered Date(s) Administered  . Hepatitis A 11/22/2005  . Hepatitis B 07/16/2001  . Influenza,inj,Quad PF,6+ Mos 12/10/2012, 01/11/2017, 12/13/2017, 12/09/2018  . PFIZER SARS-COV-2 Vaccination 06/17/2019, 07/15/2019  . Pneumococcal Polysaccharide-23 12/10/2012  . Td 04/17/2003  . Tdap 04/21/2013  . Typhoid Live  11/22/2005  . Yellow Fever 11/22/2005  . Zoster Recombinat (Shingrix) 10/03/2017, 12/19/2017    Past Medical History:  Diagnosis Date  . ADD (attention deficit disorder with hyperactivity)   . Allergic rhinitis   . Anxiety   . Atypical chest pain    Myocardial perf imaging 04/04/16: Low risk stress nuclear study with prior inferior infarct and mild peri-infarct ischemia; EF 55 with mild hypokinesis of the inferior wall.  Cath normal, so stress test was FALSE POS.  . Bowel obstruction ([place]) 06-25-2010   post mastectomy  . Breast cancer ([place]) 2012   left mastectomy and chemotherapy  . Cholelithiasis 03/2016   u/s: no signs of cholecystitis  . Depression   . Fibromyalgia syndrome   . GERD (gastroesophageal reflux disease)   . History of chemotherapy    2012 completed  . Hyperlipidemia   . Hypertension   . Mild persistent asthma    dx'd age 70 (Dr. Gwenette Greet) hx of noncompliance with controller therapy due to cost. PFTs 01/2019->mild small airways obst dz, no resp to bronchodil. Trelegy started 02/2019 by Dr. Ander Slade  . Morbid obesity ([place])   . NASH (nonalcoholic steatohepatitis) 02/2010   u/s confirmed 02/2010 (hx of transaminitis).  Repeat u/s 03/2016 showed no sign of cirrhosis or liver mass, just moderate hepatic steatosis. AST/ALT stable 06/2019  . Neuropathy of both upper extremities    and both feet  . OSA (obstructive sleep apnea)    Dr Gwenette Greet initially: pt couldn't tolerate CPAP.  Re-eval 12/2017  re-confirmed severe OSA, CPAP at 11 recommended.  . Ovarian mass    resected 07/12/10  . RLS (restless legs syndrome)   . Type II or unspecified type diabetes mellitus without mention of complication, uncontrolled 05/15/2012    Tobacco History: Social History   Tobacco Use  Smoking Status Former Smoker  . Packs/day: 0.50  . Years: 24.00  . Pack years: 12.00  . Types: Cigarettes  . Quit date: 08/16/2018  . Years since quitting: 0.9  Smokeless Tobacco Never Used   Counseling given:  Not Answered   Outpatient Medications Prior to Visit  Medication Sig Dispense Refill  . albuterol (PROVENTIL) (2.5 MG/3ML) 0.083% nebulizer solution Take 3 mLs (2.5 mg total) by nebulization every 6 (six) hours as needed for wheezing or shortness of breath. 75 mL 12  . albuterol (VENTOLIN HFA) 108 (90 Base) MCG/ACT inhaler INHALE ONE PUFF BY MOUTH EVERY 6 HOURS AS NEEDED FOR SHORTNESS OF BREATH AND WHEEZING 18 g 1  . atorvastatin (LIPITOR) 40 MG tablet TAKE ONE TABLET BY MOUTH DAILY 90 tablet 0  . budesonide-formoterol (SYMBICORT) 160-4.5 MCG/ACT inhaler Inhale 2 puffs into the lungs 2 (two) times daily. 1 Inhaler 11  . CALCIUM-VITAMIN D PO Take by mouth.    . citalopram (CELEXA) 40 MG tablet TAKE ONE TABLET BY MOUTH DAILY 90 tablet 3  . clonazePAM (KLONOPIN) 0.5 MG tablet TAKE 1-2 TABLETS BY MOUTH EVERY EVENING AT BEDTIME AS NEEDED FOR INSOMNIA 60 tablet 5  . dextroamphetamine (DEXEDRINE SPANSULE) 15 MG 24 hr capsule Take 1 capsule (15 mg total) by mouth daily. 30 capsule 0  . Docusate Calcium (STOOL SOFTENER PO) Take by mouth.    . fexofenadine (ALLEGRA) 180 MG tablet Take 180 mg by mouth daily.    . fluticasone (CUTIVATE) 0.05 % cream Apply to affected areas bid prn 30 g 1  . ibuprofen (ADVIL,MOTRIN) 200 MG tablet Take 800 mg by mouth as needed for fever or moderate pain.     . Insulin Pen Needle (BD PEN NEEDLE NANO U/F) 32G X 4 MM MISC Use with Victoza as directed once daily 100 each 1  . liraglutide (VICTOZA) 18 MG/3ML SOPN INJECT 1.2 MG SUBCUTANEOUSLY DAILY 6 mL 11  . magnesium oxide (MAG-OX) 400 MG tablet Take 400 mg by mouth daily.    . montelukast (SINGULAIR) 10 MG tablet Take 1 tablet (10 mg total) by mouth at bedtime. 30 tablet 3  . Multiple Vitamin (MULTIVITAMIN) tablet Take 1 tablet by mouth daily.    . Omega-3 Fatty Acids (FISH OIL) 1200 MG CPDR Take by mouth.    . pioglitazone (ACTOS) 15 MG tablet Take 1 tablet (15 mg total) by mouth daily. 30 tablet 3  . pregabalin (LYRICA)  150 MG capsule Take 1 capsule (150 mg total) by mouth 2 (two) times daily. 60 capsule 5  . rOPINIRole (REQUIP) 0.5 MG tablet TAKE ONE TABLET BY MOUTH DAILY 90 tablet 3  . triamterene-hydrochlorothiazide (MAXZIDE-25) 37.5-25 MG tablet TAKE 1/2 TABLET BY MOUTH DAILY 45 tablet 3  . verapamil (CALAN) 120 MG tablet TAKE ONE TABLET BY MOUTH TWICE A DAY 180 tablet 0  . ADVAIR DISKUS 500-50 MCG/DOSE AEPB     . cetirizine (ZYRTEC) 10 MG tablet Take 10 mg by mouth daily.      . Fluticasone-Umeclidin-Vilant (TRELEGY ELLIPTA) 200-62.5-25 MCG/INH AEPB Inhale 1 puff into the lungs daily. (Patient not taking: Reported on 06/18/2019) 60 each 5   No facility-administered medications prior to visit.     Review  of Systems:   Constitutional:   No  weight loss, night sweats,  Fevers, chills,  +fatigue, or  lassitude.  HEENT:   No headaches,  Difficulty swallowing,  Tooth/dental problems, or  Sore throat,                No sneezing, itching, ear ache, nasal congestion, post nasal drip,   CV:  No chest pain,  Orthopnea, PND, swelling in lower extremities, anasarca, dizziness, palpitations, syncope.   GI  No heartburn, indigestion, abdominal pain, nausea, vomiting, diarrhea, change in bowel habits, loss of appetite, bloody stools.   Resp: .  No chest wall deformity  Skin: no rash or lesions.  GU: no dysuria, change in color of urine, no urgency or frequency.  No flank pain, no hematuria   MS:  No joint pain or swelling.  No decreased range of motion.  No back pain.    Physical Exam  BP 140/80 (BP Location: Left Arm, Cuff Size: Large)   Pulse 93   Temp 98.6 F (37 C) (Temporal)   Ht 5' 4"  (1.626 m)   Wt 275 lb (124.7 kg)   LMP 07/14/2010 Comment: ovaries removed 2012  SpO2 96%   BMI 47.20 kg/m   GEN: A/Ox3; pleasant , NAD, BMI 47   HEENT:  [place]/AT,    NOSE-clear, THROAT-clear, no lesions, no postnasal drip or exudate noted.   NECK:  Supple w/ fair ROM; no JVD; normal carotid impulses w/o  bruits; no thyromegaly or nodules palpated; no lymphadenopathy.    RESP  Clear  P & A; w/o, wheezes/ rales/ or rhonchi. no accessory muscle use, no dullness to percussion  CARD:  RRR, no m/r/g, no peripheral edema, pulses intact, no cyanosis or clubbing.  GI:   Soft & nt; nml bowel sounds; no organomegaly or masses detected.   Musco: Warm bil, no deformities or joint swelling noted.   Neuro: alert, no focal deficits noted.    Skin: Warm, no lesions or rashes    Lab Results:  CBC    Component Value Date/Time   WBC 9.2 12/09/2018 1104   RBC 4.32 12/09/2018 1104   HGB 13.9 12/09/2018 1104   HGB 14.0 01/15/2015 0911   HCT 40.3 12/09/2018 1104   HCT 40.2 01/15/2015 0911   PLT 268.0 12/09/2018 1104   PLT 277 01/15/2015 0911   MCV 93.3 12/09/2018 1104   MCV 91.3 01/15/2015 0911   MCH 31.7 11/07/2018 2342   MCHC 34.6 12/09/2018 1104   RDW 12.9 12/09/2018 1104   RDW 12.8 01/15/2015 0911   LYMPHSABS 3.5 12/09/2018 1104   LYMPHSABS 3.0 01/15/2015 0911   MONOABS 0.5 12/09/2018 1104   MONOABS 0.6 01/15/2015 0911   EOSABS 0.3 12/09/2018 1104   EOSABS 0.3 01/15/2015 0911   BASOSABS 0.1 12/09/2018 1104   BASOSABS 0.1 01/15/2015 0911    BMET    Component Value Date/Time   NA 139 06/20/2019 1341   NA 141 01/15/2015 0911   K 4.3 06/20/2019 1341   K 3.6 01/15/2015 0911   CL 101 06/20/2019 1341   CO2 26 06/20/2019 1341   CO2 27 01/15/2015 0911   GLUCOSE 201 (H) 06/20/2019 1341   GLUCOSE 153 (H) 01/15/2015 0911   BUN 12 06/20/2019 1341   BUN 17.2 01/15/2015 0911   CREATININE 0.68 06/20/2019 1341   CREATININE 0.8 01/15/2015 0911   CALCIUM 10.0 06/20/2019 1341   CALCIUM 10.7 (H) 01/15/2015 0911   GFRNONAA >60 11/07/2018 2342   GFRAA >60  11/07/2018 2342    BNP    Component Value Date/Time   BNP 9.0 07/23/2015 0928    ProBNP No results found for: PROBNP  Imaging: No results found.    PFT Results Latest Ref Rng & Units 02/04/2019  FVC-Pre L 2.99  FVC-Predicted  Pre % 82  FVC-Post L 3.10  FVC-Predicted Post % 85  Pre FEV1/FVC % % 81  Post FEV1/FCV % % 82  FEV1-Pre L 2.43  FEV1-Predicted Pre % 85  FEV1-Post L 2.53  DLCO UNC% % 91  DLCO COR %Predicted % 92  TLC L 5.74  TLC % Predicted % 110  RV % Predicted % 139    No results found for: NITRICOXIDE      Assessment & Plan:   Asthma, mild persistent Has increased symptom burden despite aggressive maintenance regimen with Symbicort Singulair Allegra.  Lung function is normal.  We will give a trial of BREZTRI .  Check labs with CBC be met and BNP.  Check chest x-ray.  Plan  Patient Instructions  Change Symbicort to BREZTRI 2 puffs Twice daily  , rinse after use.  Continue on Singulair and Allegra.  Albuterol As needed   Chest xray and labs today .  Follow up in 4 weeks with Dr. Karsten Fells or Estefano Victory NP and As needed   Please contact office for sooner follow up if symptoms do not improve or worsen or seek emergency care       Allergic rhinitis Continue on current regimen.  Continue trigger prevention     Dianne Whelchel, NP 08/01/2019

## 2019-08-02 LAB — D-DIMER, QUANTITATIVE: D-Dimer, Quant: 1.92 mcg/mL FEU — ABNORMAL HIGH (ref <0.50)

## 2019-08-04 ENCOUNTER — Telehealth: Payer: Self-pay | Admitting: Adult Health

## 2019-08-04 ENCOUNTER — Other Ambulatory Visit: Payer: Self-pay | Admitting: Adult Health

## 2019-08-04 DIAGNOSIS — I2699 Other pulmonary embolism without acute cor pulmonale: Secondary | ICD-10-CM

## 2019-08-04 NOTE — Telephone Encounter (Signed)
As I explained to the patient on the phone, this was sent to [facility] (where pt's insurance will authorize).  They have rec'd the fax, I confirmed with a follow up phone call.  Pl. Will be reached by 6 PM today to have this scheduled.

## 2019-08-04 NOTE — Telephone Encounter (Signed)
I called the patient and let her know we are waiting on Auth then I will schedule it

## 2019-08-04 NOTE — Telephone Encounter (Signed)
Rec'd authorization from [place] for [facility].  Faxed STAT order to [phone] (ph# [phone]).  They will contact the patient to schedule.  I called pt to make her aware.  Pt verbalized understanding & states nothing further needed at this time.

## 2019-08-05 ENCOUNTER — Telehealth: Payer: Self-pay | Admitting: Adult Health

## 2019-08-05 NOTE — Telephone Encounter (Signed)
Pt scheduled 5/25 @ 12.  Pt aware as [facility] called the pt to schedule.

## 2019-08-05 NOTE — Telephone Encounter (Signed)
Per chart, pt had ct chest today at 12:00 at [facility].  That facility is not a [facility] facility, so we would have to get the CT report faxed to our office since we do not have access to their full EMR.  I did not see these results in TP's look-at.   Called pt and advised that we were still waiting on CT results to be faxed from [facility], and advised that we would call her to relay results as soon as we reviewed them.  Pt states she will call [facility] to try and expedite this process.  Provided pt with our fax #.  Will await CT results from [facility].

## 2019-08-06 NOTE — Telephone Encounter (Signed)
CT results found in Epic PACS  CT neg for PE and NAD  Patient aware and to cont with ov recs. Please contact office for sooner follow up if symptoms do not improve or worsen or seek emergency care

## 2019-08-06 NOTE — Telephone Encounter (Signed)
Patient is returning Rexene Edison NP phone call. Patient phone number is [phone].

## 2019-08-25 ENCOUNTER — Other Ambulatory Visit: Payer: Self-pay | Admitting: Family Medicine

## 2019-08-28 ENCOUNTER — Other Ambulatory Visit: Payer: Self-pay | Admitting: Family Medicine

## 2019-08-29 ENCOUNTER — Other Ambulatory Visit: Payer: Self-pay

## 2019-08-29 MED ORDER — ALBUTEROL SULFATE HFA 108 (90 BASE) MCG/ACT IN AERS: INHALATION_SPRAY | RESPIRATORY_TRACT | 1 refills | Status: DC

## 2019-08-29 MED ORDER — DEXTROAMPHETAMINE SULFATE ER 15 MG PO CP24: 15.0000 mg | ORAL_CAPSULE | Freq: Every day | ORAL | 0 refills | Status: DC

## 2019-08-29 NOTE — Telephone Encounter (Signed)
RF request for albuterol inhaler LOV:06/18/19 Next ov: 09/17/19 Last written:06/17/19(18g,1)  Requesting: dexedrine Contract:09/06/18 UDS:09/06/18 Last Visit:06/18/19 Next Visit:09/17/19 Last Refill:07/04/19(30,0)  Please Advise. Medications pending

## 2019-08-29 NOTE — Telephone Encounter (Signed)
Patient called in requesting a refill on medication   dextroamphetamine (DEXEDRINE SPANSULE) 15 MG 24 hr capsule   albuterol (VENTOLIN HFA) 108 (90 Base) MCG/ACT inhaler  [facility] [address] [place], [place]

## 2019-09-04 ENCOUNTER — Other Ambulatory Visit: Payer: Self-pay

## 2019-09-04 ENCOUNTER — Encounter: Payer: Self-pay | Admitting: Family Medicine

## 2019-09-04 MED ORDER — FLUTICASONE PROPIONATE 0.05 % EX CREA: TOPICAL_CREAM | CUTANEOUS | 1 refills | Status: DC

## 2019-09-04 MED ORDER — FLUTICASONE PROPIONATE 0.05 % EX CREA: TOPICAL_CREAM | CUTANEOUS | 2 refills | Status: DC

## 2019-09-04 NOTE — Telephone Encounter (Signed)
OK to increase her otc magnesium oxide to TWO of the 500 mg tabs every day. I eRx'd the eczema cream.

## 2019-09-04 NOTE — Telephone Encounter (Signed)
Pt's next appt is 09/17/19. Should she increase her magnesium 462m qd?

## 2019-09-10 ENCOUNTER — Telehealth: Payer: Self-pay | Admitting: Adult Health

## 2019-09-10 MED ORDER — BREZTRI AEROSPHERE 160-9-4.8 MCG/ACT IN AERO: 2.0000 | INHALATION_SPRAY | Freq: Two times a day (BID) | RESPIRATORY_TRACT | 5 refills | Status: DC

## 2019-09-10 NOTE — Telephone Encounter (Signed)
Patient calling in requesting [facility] prescription. She stated the sample of [name] is working very well. Order sent to preferred pharmacy.

## 2019-09-17 ENCOUNTER — Ambulatory Visit: Payer: Managed Care, Other (non HMO) | Admitting: Family Medicine

## 2019-09-17 DIAGNOSIS — Z0289 Encounter for other administrative examinations: Secondary | ICD-10-CM

## 2019-09-17 NOTE — Progress Notes (Deleted)
OFFICE VISIT  09/17/2019   CC: No chief complaint on file.  HPI:    Patient is a 54 y.o. Caucasian female who presents for f/u chronic medical problems: DM 2, HTN, fibromyalgia, anx/dep, Adult ADD. Most recent f/u was 06/18/19-->virtual. A/P as of last visit: "1) Recurrent acute sinusitis: resolved. Has some chronic rhinitis. Nothing new for this today.  2) Mod persistent asthma: seems to need albuterol tid still although describes her breathing as being much improved since the augmentin relatively recently. No DPI's anymore->doesn't tolerate them.  Trelegy and advair no help. We'll try symbicort 160/4.5. I encouraged her to f/u with Pulmonologist, Dr. Ander Slade, at least one more time.  3) DM 2: victoza compliant but no home monitoring of glucoses. HbA1c ordered future. Lytes/cr future.  4) Hx of transaminasemia: suspect fatty liver/NASH. Viral hep serologist neg. Hepatic panel future.  5) Fibromyalgia syndrome: stable. Continue lyrica 144m bid. CSC and UDS UTD.  6) Recurrent MDD and GAD: The current medical regimen is effective;  continue present plan and medications. CSC and UDS UTD.  7) Adult ADD: The current medical regimen is effective;  continue present plan and medications. I did electronic rx's for dexedrin ER 140m 2 qAM, #60 today for each of the next 3 months.  Appropriate fill on/after date was noted on each rx."  INTERIM HX: Has established with [facility] pulm About 8 wks ago she got a CT angio to r/o PE and this showed NO PE (Tammy Parrett, NP with [facility]). [facility].  ***   PMP AWARE reviewed today: most recent rx for *** was filled ***, # ***, rx by ***. No red flags.    Past Medical History:  Diagnosis Date  . ADD (attention deficit disorder with hyperactivity)   . Allergic rhinitis   . Anxiety   . Atypical chest pain    Myocardial perf imaging 04/04/16: Low risk stress nuclear study with  prior inferior infarct and mild peri-infarct ischemia; EF 55 with mild hypokinesis of the inferior wall.  Cath normal, so stress test was FALSE POS.  . [place]   post mastectomy  . [place]   [place] m[place]ctomy and chemotherapy  . Cholelithiasis 03/2016   u/s: no signs of cholecystitis  . Depression   . Fibromyalgia syndrome   . GERD (gastroesophageal reflux disease)   . History of chemotherapy    2012 completed  . Hyperlipidemia   . Hypertension   . Mild persistent asthma    dx'd age 89Dr. ClGwenette Greethx of noncompliance with controller therapy due to cost. PFTs 01/2019->mild small airways obst dz, no resp to bronchodil. Trelegy started 02/2019 by Dr. OlAnder Slade. [place]bid [place] ([place]  . NASH (nonalcoholic steatohepatitis) 02/2010   u/s confirmed 02/2010 (hx of transaminitis).  Repeat u/s 03/2016 showed no sign of cirrhosis or liver mass, just moderate hepatic steatosis. AST/ALT stable 06/2019  . Neuropathy of both upper extremities    and both feet  . OSA (obstructive sleep apnea)    Dr ClGwenette Greetnitially: pt couldn't tolerate CPAP.  Re-eval 12/2017 re-confirmed severe OSA, CPAP at 11 recommended.  . Ovarian mass    resected 07/12/10  . RLS (restless legs syndrome)   . Type II or unspecified type diabetes mellitus without mention of complication, uncontrolled 05/15/2012    Past Surgical History:  Procedure Laterality Date  . BILATERAL SALPINGOOPHORECTOMY  07/12/2010   enlarged ovary  .  CARDIOVASCULAR STRESS TEST  04/04/2016   Low risk stress nuclear study with prior inferior infarct and mild peri-infarct ischemia; EF 55% with mild hypokinesis of the inferior wall.  F/u cath was NORMAL, so this is suspected to be a false pos stress test (diaphragmatic attenuation)  . G 2 P 2    . LEFT HEART CATH AND CORONARY ANGIOGRAPHY N/A 04/20/2016   Normal coronaries.  EF 55-65%, normal LV function. (Stress test prior to this was false pos due to diaphragmatic  attenuation). Procedure: Left Heart Cath and Coronary Angiography;  Surgeon: Peter M Martinique, MD;  Location: [place] [facility];  Service: Cardiovascular;  Laterality: N/A;  . MASTECTOMY  06/21/2010   Left ; [facility]    Outpatient Medications Prior to Visit  Medication Sig Dispense Refill  . ADVAIR DISKUS 500-50 MCG/DOSE AEPB     . albuterol (PROVENTIL) (2.5 MG/3ML) 0.083% nebulizer solution Take 3 mLs (2.5 mg total) by nebulization every 6 (six) hours as needed for wheezing or shortness of breath. 75 mL 12  . albuterol (VENTOLIN HFA) 108 (90 Base) MCG/ACT inhaler INHALE ONE PUFF BY MOUTH EVERY 6 HOURS AS NEEDED FOR SHORTNESS OF BREATH AND WHEEZING 18 g 1  . atorvastatin (LIPITOR) 40 MG tablet TAKE ONE TABLET BY MOUTH DAILY 90 tablet 0  . Budeson-Glycopyrrol-Formoterol (BREZTRI AEROSPHERE) 160-9-4.8 MCG/ACT AERO Inhale 2 puffs into the lungs in the morning and at bedtime. 2 g 5  . budesonide-formoterol (SYMBICORT) 160-4.5 MCG/ACT inhaler Inhale 2 puffs into the lungs 2 (two) times daily. 1 Inhaler 11  . CALCIUM-VITAMIN D PO Take by mouth.    . cetirizine (ZYRTEC) 10 MG tablet Take 10 mg by mouth daily.      . citalopram (CELEXA) 40 MG tablet TAKE ONE TABLET BY MOUTH DAILY 90 tablet 3  . clonazePAM (KLONOPIN) 0.5 MG tablet TAKE 1-2 TABLETS BY MOUTH EVERY EVENING AT BEDTIME AS NEEDED FOR INSOMNIA 60 tablet 5  . dextroamphetamine (DEXEDRINE SPANSULE) 15 MG 24 hr capsule Take 1 capsule (15 mg total) by mouth daily. 30 capsule 0  . Docusate Calcium (STOOL SOFTENER PO) Take by mouth.    . fexofenadine (ALLEGRA) 180 MG tablet Take 180 mg by mouth daily.    . fluticasone (CUTIVATE) 0.05 % cream Apply to affected areas bid prn 30 g 2  . Fluticasone-Umeclidin-Vilant (TRELEGY ELLIPTA) 200-62.5-25 MCG/INH AEPB Inhale 1 puff into the lungs daily. (Patient not taking: Reported on 06/18/2019) 60 each 5  . ibuprofen (ADVIL,MOTRIN) 200 MG tablet Take 800 mg by mouth as needed for fever or moderate  pain.     . Insulin Pen Needle (BD PEN NEEDLE NANO U/F) 32G X 4 MM MISC Use with Victoza as directed once daily 100 each 1  . liraglutide (VICTOZA) 18 MG/3ML SOPN INJECT 1.2 MG SUBCUTANEOUSLY DAILY 6 mL 11  . magnesium oxide (MAG-OX) 400 MG tablet Take 400 mg by mouth daily.    . montelukast (SINGULAIR) 10 MG tablet Take 1 tablet (10 mg total) by mouth at bedtime. 30 tablet 3  . Multiple Vitamin (MULTIVITAMIN) tablet Take 1 tablet by mouth daily.    . Omega-3 Fatty Acids (FISH OIL) 1200 MG CPDR Take by mouth.    . pioglitazone (ACTOS) 15 MG tablet Take 1 tablet (15 mg total) by mouth daily. 30 tablet 3  . pregabalin (LYRICA) 150 MG capsule Take 1 capsule (150 mg total) by mouth 2 (two) times daily. 60 capsule 5  . rOPINIRole (REQUIP) 0.5 MG tablet TAKE  ONE TABLET BY MOUTH DAILY 90 tablet 3  . triamterene-hydrochlorothiazide (MAXZIDE-25) 37.5-25 MG tablet TAKE 1/2 TABLET BY MOUTH DAILY 45 tablet 3  . verapamil (CALAN) 120 MG tablet TAKE ONE TABLET BY MOUTH TWICE A DAY 180 tablet 0   No facility-administered medications prior to visit.    Allergies  Allergen Reactions  . Metoprolol Other (See Comments)    REACTION: " chest pain"  . Sulfa Antibiotics Nausea And Vomiting    ROS As per HPI  PE: Last menstrual period 07/14/2010. ***  LABS:  Lab Results  Component Value Date   TSH 2.37 12/09/2018   Lab Results  Component Value Date   WBC 9.8 08/01/2019   HGB 13.6 08/01/2019   HCT 40.3 08/01/2019   MCV 91.9 08/01/2019   PLT 273.0 08/01/2019   Lab Results  Component Value Date   CREATININE 0.64 08/01/2019   BUN 12 08/01/2019   NA 137 08/01/2019   K 4.0 08/01/2019   CL 105 08/01/2019   CO2 29 08/01/2019   Lab Results  Component Value Date   ALT 82 (H) 06/20/2019   AST 43 (H) 06/20/2019   ALKPHOS 40 12/09/2018   BILITOT 0.3 06/20/2019   Lab Results  Component Value Date   CHOL 115 12/09/2018   Lab Results  Component Value Date   HDL 34.40 (L) 12/09/2018   Lab  Results  Component Value Date   LDLCALC 47 12/09/2018   Lab Results  Component Value Date   TRIG 165.0 (H) 12/09/2018   Lab Results  Component Value Date   CHOLHDL 3 12/09/2018   Lab Results  Component Value Date   HGBA1C 7.8 (H) 06/20/2019     IMPRESSION AND PLAN:  No problem-specific Assessment & Plan notes found for this encounter.   An After Visit Summary was printed and given to the patient.  FOLLOW UP: No follow-ups on file.  Signed:  Crissie Sickles, MD           09/17/2019

## 2019-09-19 ENCOUNTER — Other Ambulatory Visit: Payer: Self-pay

## 2019-09-19 ENCOUNTER — Telehealth: Payer: Self-pay | Admitting: Adult Health

## 2019-09-19 ENCOUNTER — Other Ambulatory Visit: Payer: Self-pay | Admitting: Family Medicine

## 2019-09-19 MED ORDER — BREZTRI AEROSPHERE 160-9-4.8 MCG/ACT IN AERO
2.0000 | INHALATION_SPRAY | Freq: Two times a day (BID) | RESPIRATORY_TRACT | 4 refills | Status: DC
Start: 2019-09-19 — End: 2020-05-18

## 2019-09-19 NOTE — Telephone Encounter (Signed)
Received follow up form from PA. Called CoverMyMeds and was told patient did not PA for [employer]. Spoke with patient's pharmacy to confirm no PA was needed and was insured medication will be filled for patient without any need of PA. Left VM for patient to call office back to tell her medication would be refilled.

## 2019-09-24 ENCOUNTER — Telehealth: Payer: Self-pay | Admitting: Adult Health

## 2019-09-24 NOTE — Telephone Encounter (Signed)
  Spoke with pt, advised message from [place]. Pt understood and nothing further is needed.   Dierdre Highman, RN     09/19/19 2:53 PM Note   Received follow up form from PA. Called CoverMyMeds and was told patient did not PA for [employer]. Spoke with patient's pharmacy to confirm no PA was needed and was insured medication will be filled for patient without any need of PA. Left VM for patient to call office back to tell her medication would be refilled

## 2019-09-28 ENCOUNTER — Other Ambulatory Visit: Payer: Self-pay | Admitting: Family Medicine

## 2019-09-29 ENCOUNTER — Other Ambulatory Visit: Payer: Self-pay

## 2019-09-29 MED ORDER — ATORVASTATIN CALCIUM 40 MG PO TABS: 40.0000 mg | ORAL_TABLET | Freq: Every day | ORAL | 0 refills | Status: DC

## 2019-10-13 ENCOUNTER — Other Ambulatory Visit: Payer: Self-pay

## 2019-10-13 ENCOUNTER — Other Ambulatory Visit: Payer: Self-pay | Admitting: Family Medicine

## 2019-10-13 MED ORDER — DEXTROAMPHETAMINE SULFATE ER 15 MG PO CP24: 15.0000 mg | ORAL_CAPSULE | Freq: Every day | ORAL | 0 refills | Status: DC

## 2019-10-13 NOTE — Telephone Encounter (Signed)
Patient refill request  dextroamphetamine (DEXEDRINE SPANSULE) 15 MG 24 hr capsule [[id number]]    [facility] [address] [place]  [place], [place] [address]

## 2019-10-13 NOTE — Telephone Encounter (Signed)
Requesting: dexedrine Contract:09/06/18 UDS:09/06/18 Last Visit:06/18/19 Next Visit: advised to f/u 3 months Last Refill:08/29/19(30,0)  Please Advise. Medication pending

## 2019-10-14 ENCOUNTER — Telehealth: Payer: Self-pay

## 2019-10-14 NOTE — Telephone Encounter (Signed)
Patient was made aware refill sent.

## 2019-10-15 MED ORDER — CLONAZEPAM 0.5 MG PO TABS: ORAL_TABLET | ORAL | 5 refills | Status: DC

## 2019-10-15 NOTE — Telephone Encounter (Signed)
OK, clonaz eRxd

## 2019-11-05 ENCOUNTER — Other Ambulatory Visit: Payer: Self-pay

## 2019-11-05 ENCOUNTER — Encounter: Payer: Self-pay | Admitting: Family Medicine

## 2019-11-05 ENCOUNTER — Ambulatory Visit: Payer: Managed Care, Other (non HMO) | Admitting: Family Medicine

## 2019-11-05 VITALS — BP 112/73 | HR 80 | Temp 98.5°F | Resp 16 | Ht 64.0 in | Wt 266.4 lb

## 2019-11-05 DIAGNOSIS — B070 Plantar wart: Secondary | ICD-10-CM

## 2019-11-05 DIAGNOSIS — E1142 Type 2 diabetes mellitus with diabetic polyneuropathy: Secondary | ICD-10-CM | POA: Diagnosis not present

## 2019-11-05 DIAGNOSIS — L84 Corns and callosities: Secondary | ICD-10-CM

## 2019-11-05 DIAGNOSIS — F988 Other specified behavioral and emotional disorders with onset usually occurring in childhood and adolescence: Secondary | ICD-10-CM

## 2019-11-05 DIAGNOSIS — E119 Type 2 diabetes mellitus without complications: Secondary | ICD-10-CM | POA: Diagnosis not present

## 2019-11-05 DIAGNOSIS — K7581 Nonalcoholic steatohepatitis (NASH): Secondary | ICD-10-CM | POA: Diagnosis not present

## 2019-11-05 DIAGNOSIS — I10 Essential (primary) hypertension: Secondary | ICD-10-CM | POA: Diagnosis not present

## 2019-11-05 DIAGNOSIS — R201 Hypoesthesia of skin: Secondary | ICD-10-CM

## 2019-11-05 DIAGNOSIS — F411 Generalized anxiety disorder: Secondary | ICD-10-CM

## 2019-11-05 LAB — COMPREHENSIVE METABOLIC PANEL
ALT: 39 U/L — ABNORMAL HIGH (ref 0–35)
AST: 19 U/L (ref 0–37)
Albumin: 4 g/dL (ref 3.5–5.2)
Alkaline Phosphatase: 43 U/L (ref 39–117)
BUN: 19 mg/dL (ref 6–23)
CO2: 29 mEq/L (ref 19–32)
Calcium: 9.4 mg/dL (ref 8.4–10.5)
Chloride: 102 mEq/L (ref 96–112)
Creatinine, Ser: 0.71 mg/dL (ref 0.40–1.20)
GFR: 86.02 mL/min (ref >60.00)
Glucose, Bld: 170 mg/dL — ABNORMAL HIGH (ref 70–99)
Potassium: 4.1 mEq/L (ref 3.5–5.1)
Sodium: 138 mEq/L (ref 135–145)
Total Bilirubin: 0.5 mg/dL (ref 0.2–1.2)
Total Protein: 6.9 g/dL (ref 6.0–8.3)

## 2019-11-05 LAB — HEMOGLOBIN A1C: Hgb A1c MFr Bld: 5.9 % (ref 4.6–6.5)

## 2019-11-05 MED ORDER — DEXTROAMPHETAMINE SULFATE ER 15 MG PO CP24: 15.0000 mg | ORAL_CAPSULE | Freq: Every day | ORAL | 0 refills | Status: DC

## 2019-11-05 MED ORDER — DEXTROAMPHETAMINE SULFATE ER 15 MG PO CP24
15.0000 mg | ORAL_CAPSULE | Freq: Every day | ORAL | 0 refills | Status: DC
Start: 2019-11-05 — End: 2020-03-11

## 2019-11-05 NOTE — Progress Notes (Signed)
OFFICE VISIT  11/05/2019   CC:  Chief Complaint  Patient presents with  . Sores on left foot    occurring 1.5-2 weeks ago, has been using antifungal spray   HPI:    Patient is a 54 y.o. Caucasian female who presents for 4 mo f/u DM 2, HTN, adult ADD, and GAD/MDD + new c/o "sores on L foot". She has fibromyalgia syndrome and chronic fatigue.  A/P as of last visit: "1) Recurrent acute sinusitis: resolved. Has some chronic rhinitis. Nothing new for this today.  2) Mod persistent asthma: seems to need albuterol tid still although describes her breathing as being much improved since the augmentin relatively recently. No DPI's anymore->doesn't tolerate them.  Trelegy and advair no help. We'll try symbicort 160/4.5. I encouraged her to f/u with Pulmonologist, Dr. Ander Slade, at least one more time.  3) DM 2: victoza compliant but no home monitoring of glucoses. HbA1c ordered future. Lytes/cr future.  4) Hx of transaminasemia: suspect fatty liver/NASH. Viral hep serologist neg. Hepatic panel future.  5) Fibromyalgia syndrome: stable. Continue lyrica 141m bid. CSC and UDS UTD.  6) Recurrent MDD and GAD: The current medical regimen is effective;  continue present plan and medications. CSC and UDS UTD.  7) Adult ADD: The current medical regimen is effective;  continue present plan and medications. I did electronic rx's for dexedrin ER 165m 2 qAM, #60 today for each of the next 3 months.  Appropriate fill on/after date was noted on each rx."  INTERIM HX: Feeling pretty good. Few weeks ago noted firm callus in bottom of L 2nd toe, has dec sensation chronically in feet.  Applying otc antifungal w/out change. No burning.  Only occ tingling sensation. No hx of diab ulcer.  Last pulm f/u 07/2019-->poor control on symbicort->breztri trial initiated. She feels like this is helping pretty well, "better than the others".  DM: no home glucose monitoring. Has eaten lower carb diet  lately.   No hypoglycemic sx's.  HTN: no home bp measurements.  Adult ADD and GAD/hx of MDD:  Pt states all is going well with the med at current dosing: much improved focus, concentration, task completion.  Less frustration, better multitasking, less impulsivity and restlessness.  Mood is stable. No side effects from the medication. Mood and anxiety very stable lately.  PMP AWARE reviewed today: most recent rx for dexedrin xr was filled 10/13/19, # [id number] by me. Most recent clonaz rx filled 10/15/19, #60, rx by me. No red flags.   Past Medical History:  Diagnosis Date  . ADD (attention deficit disorder with hyperactivity)   . Allergic rhinitis   . Anxiety   . Atypical chest pain    Myocardial perf imaging 04/04/16: Low risk stress nuclear study with prior inferior infarct and mild peri-infarct ischemia; EF 55 with mild hypokinesis of the inferior wall.  Cath normal, so stress test was FALSE POS.  . Bowel obstruction ([facility]   post mastectomy  . Breast cancer ([facility]   left mastectomy and chemotherapy  . Cholelithiasis 03/2016   u/s: no signs of cholecystitis  . Depression   . Fibromyalgia syndrome   . GERD (gastroesophageal reflux disease)   . History of chemotherapy    2012 completed  . Hyperlipidemia   . Hypertension   . Mild persistent asthma    dx'd age [id number]. ClGwenette Greethx of noncompliance with controller therapy due to cost. PFTs 01/2019->mild small airways obst dz, no resp to bronchodil. Trelegy started 02/2019 by  Dr. Ander Slade  . Morbid obesity ([place])   . NASH (nonalcoholic steatohepatitis) 02/2010   u/s confirmed 02/2010 (hx of transaminitis).  Repeat u/s 03/2016 showed no sign of cirrhosis or liver mass, just moderate hepatic steatosis. AST/ALT stable 06/2019  . Neuropathy of both upper extremities    and both feet  . OSA (obstructive sleep apnea)    Dr Gwenette Greet initially: pt couldn't tolerate CPAP.  Re-eval 12/2017 re-confirmed severe OSA, CPAP at 11 recommended.   . Ovarian mass    resected 07/12/10  . RLS (restless legs syndrome)   . Type II or unspecified type diabetes mellitus without mention of complication, uncontrolled 05/15/2012    Past Surgical History:  Procedure Laterality Date  . BILATERAL SALPINGOOPHORECTOMY  07/12/2010   enlarged ovary  . CARDIOVASCULAR STRESS TEST  04/04/2016   Low risk stress nuclear study with prior inferior infarct and mild peri-infarct ischemia; EF 55% with mild hypokinesis of the inferior wall.  F/u cath was NORMAL, so this is suspected to be a false pos stress test (diaphragmatic attenuation)  . G 2 P 2    . LEFT HEART CATH AND CORONARY ANGIOGRAPHY N/A 04/20/2016   Normal coronaries.  EF 55-65%, normal LV function. (Stress test prior to this was false pos due to diaphragmatic attenuation). Procedure: Left Heart Cath and Coronary Angiography;  Surgeon: Peter M Martinique, MD;  Location: [facility];  Service: Cardiovascular;  Laterality: N/A;  . MASTECTOMY  06/21/2010   Left ; Dr [facility]    Outpatient Medications Prior to Visit  Medication Sig Dispense Refill  . ADVAIR DISKUS 500-50 MCG/DOSE AEPB     . albuterol (PROVENTIL) (2.5 MG/3ML) 0.083% nebulizer solution Take 3 mLs (2.5 mg total) by nebulization every 6 (six) hours as needed for wheezing or shortness of breath. 75 mL 12  . albuterol (VENTOLIN HFA) 108 (90 Base) MCG/ACT inhaler INHALE ONE PUFF BY MOUTH EVERY 6 HOURS AS NEEDED FOR SHORTNESS OF BREATH AND WHEEZING 18 g 1  . atorvastatin (LIPITOR) 40 MG tablet Take 1 tablet (40 mg total) by mouth daily. 90 tablet 0  . Budeson-Glycopyrrol-Formoterol (BREZTRI AEROSPHERE) 160-9-4.8 MCG/ACT AERO Inhale 2 puffs into the lungs in the morning and at bedtime. 10.7 g 4  . CALCIUM-VITAMIN D PO Take by mouth.    . cetirizine (ZYRTEC) 10 MG tablet Take 10 mg by mouth daily.      . citalopram (CELEXA) 40 MG tablet TAKE ONE TABLET BY MOUTH DAILY 90 tablet 3  . clonazePAM (KLONOPIN) 0.5 MG tablet 1 tab po bid prn  60 tablet 5  . dextroamphetamine (DEXEDRINE SPANSULE) 15 MG 24 hr capsule Take 1 capsule (15 mg total) by mouth daily. 30 capsule 0  . Docusate Calcium (STOOL SOFTENER PO) Take by mouth.    . fexofenadine (ALLEGRA) 180 MG tablet Take 180 mg by mouth daily.    . fluticasone (CUTIVATE) 0.05 % cream Apply to affected areas bid prn 30 g 2  . ibuprofen (ADVIL,MOTRIN) 200 MG tablet Take 800 mg by mouth as needed for fever or moderate pain.     . Insulin Pen Needle (BD PEN NEEDLE NANO U/F) 32G X 4 MM MISC Use with Victoza as directed once daily 100 each 1  . liraglutide (VICTOZA) 18 MG/3ML SOPN INJECT 1.2 MG SUBCUTANEOUSLY DAILY 6 mL 11  . magnesium oxide (MAG-OX) 400 MG tablet Take 400 mg by mouth daily.    . montelukast (SINGULAIR) 10 MG tablet TAKE ONE TABLET BY MOUTH AT BEDTIME  30 tablet 0  . Multiple Vitamin (MULTIVITAMIN) tablet Take 1 tablet by mouth daily.    . Omega-3 Fatty Acids (FISH OIL) 1200 MG CPDR Take by mouth.    . pioglitazone (ACTOS) 15 MG tablet Take 1 tablet (15 mg total) by mouth daily. 30 tablet 3  . pregabalin (LYRICA) 150 MG capsule Take 1 capsule (150 mg total) by mouth 2 (two) times daily. 60 capsule 5  . rOPINIRole (REQUIP) 0.5 MG tablet TAKE ONE TABLET BY MOUTH DAILY 90 tablet 3  . triamterene-hydrochlorothiazide (MAXZIDE-25) 37.5-25 MG tablet TAKE 1/2 TABLET BY MOUTH DAILY 45 tablet 3  . verapamil (CALAN) 120 MG tablet TAKE ONE TABLET BY MOUTH TWICE A DAY 180 tablet 0  . Budeson-Glycopyrrol-Formoterol (BREZTRI AEROSPHERE) 160-9-4.8 MCG/ACT AERO Inhale 2 puffs into the lungs in the morning and at bedtime. 2 g 5  . budesonide-formoterol (SYMBICORT) 160-4.5 MCG/ACT inhaler Inhale 2 puffs into the lungs 2 (two) times daily. 1 Inhaler 11  . Fluticasone-Umeclidin-Vilant (TRELEGY ELLIPTA) 200-62.5-25 MCG/INH AEPB Inhale 1 puff into the lungs daily. 60 each 5   No facility-administered medications prior to visit.    Allergies  Allergen Reactions  . Metoprolol Other (See  Comments)    REACTION: " chest pain"  . Sulfa Antibiotics Nausea And Vomiting    ROS As per HPI  PE: Vitals with BMI 11/05/2019 08/01/2019 06/18/2019  Height 5' 4"  5' 4"  -  Weight 266 lbs 6 oz 275 lbs 270 lbs  BMI 45.7 16.10 96.04  Systolic 540 981 -  Diastolic 73 80 -  Pulse 80 93 -  O2 sat 95% on RA today  Gen: Alert, well appearing.  Patient is oriented to person, place, time, and situation. AFFECT: pleasant, lucid thought and speech. L foot: pre-ulcerative callus IP joint area on plantar surface of 2nd toe, no erythema. Callus on plantar aspect of 1st MTP joint on L foot as well. Essentially has no sensation to touch bilat plantar surfaces. No significant foot/toe deformity.  No edema.  DP and PT pulses intact. No ulcer.  LABS:  Lab Results  Component Value Date   TSH 2.37 12/09/2018   Lab Results  Component Value Date   WBC 9.8 08/01/2019   HGB 13.6 08/01/2019   HCT 40.3 08/01/2019   MCV 91.9 08/01/2019   PLT 273.0 08/01/2019   Lab Results  Component Value Date   CREATININE 0.64 08/01/2019   BUN 12 08/01/2019   NA 137 08/01/2019   K 4.0 08/01/2019   CL 105 08/01/2019   CO2 29 08/01/2019   Lab Results  Component Value Date   ALT 82 (H) 06/20/2019   AST 43 (H) 06/20/2019   ALKPHOS 40 12/09/2018   BILITOT 0.3 06/20/2019   Lab Results  Component Value Date   CHOL 115 12/09/2018   Lab Results  Component Value Date   HDL 34.40 (L) 12/09/2018   Lab Results  Component Value Date   LDLCALC 47 12/09/2018   Lab Results  Component Value Date   TRIG 165.0 (H) 12/09/2018   Lab Results  Component Value Date   CHOLHDL 3 12/09/2018   Lab Results  Component Value Date   HGBA1C 7.8 (H) 06/20/2019   IMPRESSION AND PLAN:  1) Pre-ulcerative callus left 2nd toe, also plantar wart L 1st MTP--shaved both down today.  No ulcer seen.  This is in the context of chronic numbness/periph neuropathy.   Podiatry referral ordered today.   Needs diab shoes, possibly  off-loading inserta/orthotics.  Procedure:  shave callus plantar surface L foot x 2. Consent obtained.  Procedure explained. Betadine prep done, used dermablade to shave off hyperkeratotic tissue.  No bleeding, no acute complications.  Pt tolerated procedure well.  After-care discussed.  2) DM 2: with DPN. HbA1c and lytes/cr today.  3) NASH: last abd u/s 03/2016, needs f/u imaging->she wants to defer for now. Hepatic panel today.  4) HTN: The current medical regimen is effective;  continue present plan and medications. Lytes/cr today.  5) Adult ADD, GAD, hx of recurrent MDD: all stable. No new rx for cital or clonaz needed today. I did electronic rx's for dexedrin 15 ER, 1 qd, #30 today for Sept, Oct, and Nov.  Appropriate fill on/after date was noted on each rx.  An After Visit Summary was printed and given to the patient.  FOLLOW UP: Return in about 3 months (around 02/05/2020) for routine chronic illness f/u.  Signed:  Crissie Sickles, MD           11/05/2019

## 2019-11-06 ENCOUNTER — Other Ambulatory Visit: Payer: Self-pay

## 2019-11-06 DIAGNOSIS — E1142 Type 2 diabetes mellitus with diabetic polyneuropathy: Secondary | ICD-10-CM

## 2019-11-06 MED ORDER — BD PEN NEEDLE NANO U/F 32G X 4 MM MISC: 1 refills | Status: DC

## 2019-11-06 MED ORDER — VICTOZA 18 MG/3ML ~~LOC~~ SOPN: PEN_INJECTOR | SUBCUTANEOUS | 11 refills | Status: DC

## 2019-11-07 ENCOUNTER — Other Ambulatory Visit: Payer: Self-pay | Admitting: Family Medicine

## 2019-11-10 ENCOUNTER — Other Ambulatory Visit: Payer: Self-pay

## 2019-11-10 MED ORDER — MONTELUKAST SODIUM 10 MG PO TABS
10.0000 mg | ORAL_TABLET | Freq: Every day | ORAL | 0 refills | Status: DC
Start: 2019-11-10 — End: 2020-03-11

## 2019-11-12 ENCOUNTER — Other Ambulatory Visit: Payer: Self-pay

## 2019-11-12 MED ORDER — ALBUTEROL SULFATE HFA 108 (90 BASE) MCG/ACT IN AERS: INHALATION_SPRAY | RESPIRATORY_TRACT | 1 refills | Status: DC

## 2019-11-12 MED ORDER — PREGABALIN 150 MG PO CAPS
150.0000 mg | ORAL_CAPSULE | Freq: Two times a day (BID) | ORAL | 5 refills | Status: DC
Start: 2019-11-12 — End: 2020-04-02

## 2019-11-20 ENCOUNTER — Ambulatory Visit (INDEPENDENT_AMBULATORY_CARE_PROVIDER_SITE_OTHER): Payer: Managed Care, Other (non HMO) | Admitting: Sports Medicine

## 2019-11-20 ENCOUNTER — Encounter: Payer: Self-pay | Admitting: Sports Medicine

## 2019-11-20 ENCOUNTER — Other Ambulatory Visit: Payer: Self-pay

## 2019-11-20 DIAGNOSIS — E119 Type 2 diabetes mellitus without complications: Secondary | ICD-10-CM

## 2019-11-20 DIAGNOSIS — L84 Corns and callosities: Secondary | ICD-10-CM | POA: Diagnosis not present

## 2019-11-20 DIAGNOSIS — B070 Plantar wart: Secondary | ICD-10-CM | POA: Diagnosis not present

## 2019-11-20 NOTE — Progress Notes (Signed)
Subjective: [name] is a 54 y.o. female patient who presents to office for evaluation of plantar wart on the left foot.  Patient reports that her PCP trimmed it about 2 weeks ago when he saw her and reports that she needed to go get it further treated.  Patient reports that she was recently diagnosed with diabetes and has had neuropathy symptoms and does not have the greatest feeling in both her feet and states that she had these neuropathy symptoms even before the diagnosis of diabetes.  Patient reports that her last A1c was recorded by PCP and reports that she is working on getting her sugars down.  No other pedal complaints noted.  Patient Active Problem List   Diagnosis Date Noted  . Abnormal nuclear stress test 04/12/2016  . Cervical cancer screening 09/20/2015  . UTI (urinary tract infection) 08/27/2015  . Maxillary sinusitis, acute 06/22/2015  . Lymphadenitis 06/22/2015  . Infection of urinary tract 02/12/2015  . Eustachian tube dysfunction 08/31/2013  . BPPV (benign paroxysmal positional vertigo) 04/21/2013  . Adult ADHD 04/21/2013  . Cellulitis 03/07/2013  . Obesity, Class III, BMI 40-49.9 (morbid obesity) ([place]) 06/24/2012  . Diabetes mellitus without complication ([place]) 97/58/8325  . Peripheral neuropathy 08/18/2011  . Breast cancer of upper-inner quadrant of left female breast ([place]) 04/19/2011  . TRANSAMINASES, SERUM, ELEVATED 05/09/2010  . Hyperlipidemia, mixed 04/26/2010  . SMOKER 04/26/2010  . Fatty liver 02/10/2010  . Essential hypertension 04/18/2007  . Allergic rhinitis 04/18/2007  . Asthma, mild persistent 04/18/2007  . Obstructive sleep apnea 04/10/2007  . RESTLESS LEGS SYNDROME 04/10/2007    Current Outpatient Medications on File Prior to Visit  Medication Sig Dispense Refill  . ADVAIR DISKUS 500-50 MCG/DOSE AEPB     . albuterol (PROVENTIL) (2.5 MG/3ML) 0.083% nebulizer solution Take 3 mLs (2.5 mg total) by nebulization every 6 (six) hours as  needed for wheezing or shortness of breath. 75 mL 12  . albuterol (VENTOLIN HFA) 108 (90 Base) MCG/ACT inhaler INHALE ONE PUFF BY MOUTH EVERY 6 HOURS AS NEEDED FOR SHORTNESS OF BREATH AND WHEEZING 18 g 1  . atorvastatin (LIPITOR) 40 MG tablet TAKE ONE TABLET BY MOUTH DAILY 90 tablet 0  . Budeson-Glycopyrrol-Formoterol (BREZTRI AEROSPHERE) 160-9-4.8 MCG/ACT AERO Inhale 2 puffs into the lungs in the morning and at bedtime. 10.7 g 4  . CALCIUM-VITAMIN D PO Take by mouth.    . cetirizine (ZYRTEC) 10 MG tablet Take 10 mg by mouth daily.      . citalopram (CELEXA) 40 MG tablet TAKE ONE TABLET BY MOUTH DAILY 90 tablet 3  . clonazePAM (KLONOPIN) 0.5 MG tablet 1 tab po bid prn 60 tablet 5  . dextroamphetamine (DEXEDRINE SPANSULE) 15 MG 24 hr capsule Take 1 capsule (15 mg total) by mouth daily. 30 capsule 0  . Docusate Calcium (STOOL SOFTENER PO) Take by mouth.    . fexofenadine (ALLEGRA) 180 MG tablet Take 180 mg by mouth daily.    . fluticasone (CUTIVATE) 0.05 % cream Apply to affected areas bid prn 30 g 2  . ibuprofen (ADVIL,MOTRIN) 200 MG tablet Take 800 mg by mouth as needed for fever or moderate pain.     . Insulin Pen Needle (BD PEN NEEDLE NANO U/F) 32G X 4 MM MISC Use with Victoza as directed once daily 100 each 1  . liraglutide (VICTOZA) 18 MG/3ML SOPN INJECT 1.2 MG SUBCUTANEOUSLY DAILY 6 mL 11  . magnesium oxide (MAG-OX) 400 MG tablet Take 400 mg by mouth daily.    Marland Kitchen  montelukast (SINGULAIR) 10 MG tablet Take 1 tablet (10 mg total) by mouth at bedtime. 90 tablet 0  . Multiple Vitamin (MULTIVITAMIN) tablet Take 1 tablet by mouth daily.    . Omega-3 Fatty Acids (FISH OIL) 1200 MG CPDR Take by mouth.    . pioglitazone (ACTOS) 15 MG tablet Take 1 tablet (15 mg total) by mouth daily. 30 tablet 3  . pregabalin (LYRICA) 150 MG capsule Take 1 capsule (150 mg total) by mouth 2 (two) times daily. 60 capsule 5  . rOPINIRole (REQUIP) 0.5 MG tablet TAKE ONE TABLET BY MOUTH DAILY 90 tablet 3  .  triamterene-hydrochlorothiazide (MAXZIDE-25) 37.5-25 MG tablet TAKE 1/2 TABLET BY MOUTH DAILY 45 tablet 3  . verapamil (CALAN) 120 MG tablet TAKE ONE TABLET BY MOUTH TWICE A DAY 180 tablet 0   No current facility-administered medications on file prior to visit.    Allergies  Allergen Reactions  . Metoprolol Other (See Comments)    REACTION: " chest pain"  . Sulfa Antibiotics Nausea And Vomiting    Objective:  General: Alert and oriented x3 in no acute distress  Dermatology: Keratotic lesion present measuring less than 0.5 cm at submet 1 on left with no skin lines transversing the lesion, pain is present with medial lateral pressure to the lesion, capillaries with pin point bleeding noted, there is also reactive keratosis plantar hallux bilateral that is mechanical in nature consistent with callus.  No webspace macerations, no ecchymosis bilateral, all nails x 10 are well manicured.  Vascular: Dorsalis Pedis and Posterior Tibial pedal pulses 2/4, Capillary Fill Time 3 seconds, + pedal hair growth bilateral, no edema bilateral lower extremities, Temperature gradient within normal limits.  Neurology: Gross sensation intact via light touch bilateral.  Subjective lack of sensation and constant numbness and tingling to both feet.  History of neuropathy related to diabetes.  Musculoskeletal: No tenderness with palpation at the lesion sites bilateral, Muscular strength 5/5 in all groups without pain or limitation on range of motion except at first MPJs where there is limitation and increased plantar hallux callus bilateral. No symptomatic lower extremity muscular or boney deformity noted.  Assessment and Plan: Problem List Items Addressed This Visit      Endocrine   Diabetes mellitus without complication ([place])    Other Visit Diagnoses    Plantar wart, left foot    -  Primary   Callus          -Complete examination performed -Discussed treatment options for wart on left submet 1 -Parred  keratoic warty lesion using a chisel blade x1; treated the area with Catharidin covered with bandaid; Advised patient of blistering reaction that will occur from application of medication and once this happens replace bandaid with neosporin and tape/bandaid -Encouraged good hygiene habits to prevent reinfection -Patient to return to office 3 to 4 weeks for wart check or sooner if condition worsens.  Landis Martins, DPM

## 2019-11-27 ENCOUNTER — Other Ambulatory Visit: Payer: Self-pay | Admitting: Family Medicine

## 2019-12-11 ENCOUNTER — Other Ambulatory Visit: Payer: Self-pay

## 2019-12-11 MED ORDER — ALBUTEROL SULFATE HFA 108 (90 BASE) MCG/ACT IN AERS
INHALATION_SPRAY | RESPIRATORY_TRACT | 1 refills | Status: DC
Start: 2019-12-11 — End: 2020-04-14

## 2019-12-11 MED ORDER — VERAPAMIL HCL 120 MG PO TABS: 120.0000 mg | ORAL_TABLET | Freq: Two times a day (BID) | ORAL | 0 refills | Status: DC

## 2019-12-18 ENCOUNTER — Ambulatory Visit: Payer: Managed Care, Other (non HMO) | Admitting: Sports Medicine

## 2020-01-23 ENCOUNTER — Other Ambulatory Visit: Payer: Self-pay

## 2020-01-23 MED ORDER — ALBUTEROL SULFATE (2.5 MG/3ML) 0.083% IN NEBU: 2.5000 mg | INHALATION_SOLUTION | Freq: Four times a day (QID) | RESPIRATORY_TRACT | 5 refills | Status: DC | PRN

## 2020-01-26 ENCOUNTER — Telehealth: Payer: Self-pay | Admitting: Family Medicine

## 2020-01-26 NOTE — Telephone Encounter (Signed)
Rx that was on file with pharmacy has been requested to fill. Pt should be able to get Rx today. Scheduled pt for [facility] f/u.

## 2020-01-26 NOTE — Telephone Encounter (Signed)
Patient called to ask for refill of Dexidrine "and a couple of refills." Please send to same [facility] on [address].

## 2020-02-03 ENCOUNTER — Other Ambulatory Visit: Payer: Self-pay | Admitting: Family Medicine

## 2020-02-06 ENCOUNTER — Other Ambulatory Visit: Payer: Self-pay | Admitting: Family Medicine

## 2020-02-10 ENCOUNTER — Ambulatory Visit: Payer: Managed Care, Other (non HMO) | Admitting: Family Medicine

## 2020-02-10 DIAGNOSIS — Z0289 Encounter for other administrative examinations: Secondary | ICD-10-CM

## 2020-02-10 NOTE — Progress Notes (Deleted)
OFFICE VISIT  02/10/2020  CC: No chief complaint on file.   HPI:    Patient is a 54 y.o. Caucasian female who presents for 3 mo f/u DM 2, HTN, adult ADD, and GAD/MDD. She has fibromyalgia syndrome and chronic fatigue. A/P as of last visit: "1) Pre-ulcerative callus left 2nd toe, also plantar wart L 1st MTP--shaved both down today.  No ulcer seen.  This is in the context of chronic numbness/periph neuropathy.   Podiatry referral ordered today.   Needs diab shoes, possibly off-loading inserta/orthotics.  Procedure: shave callus plantar surface L foot x 2. Consent obtained.  Procedure explained. Betadine prep done, used dermablade to shave off hyperkeratotic tissue.  No bleeding, no acute complications.  Pt tolerated procedure well.  After-care discussed.  2) DM 2: with DPN. HbA1c and lytes/cr today.  3) NASH: last abd u/s 03/2016, needs f/u imaging->she wants to defer for now. Hepatic panel today.  4) HTN: The current medical regimen is effective;  continue present plan and medications. Lytes/cr today.  5) Adult ADD, GAD, hx of recurrent MDD: all stable. No new rx for cital or clonaz needed today. I did electronic rx's for dexedrin 15 ER, 1 qd, #30 today for Sept, Oct, and Nov.  Appropriate fill on/after date was noted on each rx."  INTERIM HX: ***  PMP AWARE reviewed today: most recent rx for *** was filled ***, # ***, rx by ***. No red flags.  Past Medical History:  Diagnosis Date  . ADD (attention deficit disorder with hyperactivity)   . Allergic rhinitis   . Anxiety   . Atypical chest pain    Myocardial perf imaging 04/04/16: Low risk stress nuclear study with prior inferior infarct and mild peri-infarct ischemia; EF 55 with mild hypokinesis of the inferior wall.  Cath normal, so stress test was FALSE POS.  . Bowel obstruction ([place]) 06-25-2010   post mastectomy  . Breast cancer ([place]) 2012   left mastectomy and chemotherapy  . Cholelithiasis 03/2016   u/s: no  signs of cholecystitis  . Depression   . Fibromyalgia syndrome   . GERD (gastroesophageal reflux disease)   . History of chemotherapy    2012 completed  . Hyperlipidemia   . Hypertension   . Mild persistent asthma    dx'd age 40 (Dr. Gwenette Greet) hx of noncompliance with controller therapy due to cost. PFTs 01/2019->mild small airways obst dz, no resp to bronchodil. Trelegy started 02/2019 by Dr. Ander Slade  . Morbid obesity ([place])   . NASH (nonalcoholic steatohepatitis) 02/2010   u/s confirmed 02/2010 (hx of transaminitis).  Repeat u/s 03/2016 showed no sign of cirrhosis or liver mass, just moderate hepatic steatosis. AST/ALT stable 06/2019  . Neuropathy of both upper extremities    and both feet  . OSA (obstructive sleep apnea)    Dr Gwenette Greet initially: pt couldn't tolerate CPAP.  Re-eval 12/2017 re-confirmed severe OSA, CPAP at 11 recommended.  . Ovarian mass    resected 07/12/10  . RLS (restless legs syndrome)   . Type II or unspecified type diabetes mellitus without mention of complication, uncontrolled 05/15/2012    Past Surgical History:  Procedure Laterality Date  . BILATERAL SALPINGOOPHORECTOMY  07/12/2010   enlarged ovary  . CARDIOVASCULAR STRESS TEST  04/04/2016   Low risk stress nuclear study with prior inferior infarct and mild peri-infarct ischemia; EF 55% with mild hypokinesis of the inferior wall.  F/u cath was NORMAL, so this is suspected to be a false pos stress test (diaphragmatic attenuation)  .  G 2 P 2    . LEFT HEART CATH AND CORONARY ANGIOGRAPHY N/A 04/20/2016   Normal coronaries.  EF 55-65%, normal LV function. (Stress test prior to this was false pos due to diaphragmatic attenuation). Procedure: Left Heart Cath and Coronary Angiography;  Surgeon: Peter M Martinique, MD;  Location: [place] [facility];  Service: Cardiovascular;  Laterality: N/A;  . MASTECTOMY  06/21/2010   Left ; Dr [facility]    Outpatient Medications Prior to Visit  Medication Sig Dispense Refill  .  ADVAIR DISKUS 500-50 MCG/DOSE AEPB     . albuterol (PROVENTIL) (2.5 MG/3ML) 0.083% nebulizer solution Take 3 mLs (2.5 mg total) by nebulization every 6 (six) hours as needed for wheezing or shortness of breath. 75 mL 5  . albuterol (VENTOLIN HFA) 108 (90 Base) MCG/ACT inhaler INHALE ONE PUFF BY MOUTH EVERY 6 HOURS AS NEEDED FOR SHORTNESS OF BREATH AND WHEEZING 18 g 1  . atorvastatin (LIPITOR) 40 MG tablet TAKE ONE TABLET BY MOUTH DAILY 90 tablet 0  . Budeson-Glycopyrrol-Formoterol (BREZTRI AEROSPHERE) 160-9-4.8 MCG/ACT AERO Inhale 2 puffs into the lungs in the morning and at bedtime. 10.7 g 4  . CALCIUM-VITAMIN D PO Take by mouth.    . cetirizine (ZYRTEC) 10 MG tablet Take 10 mg by mouth daily.      . citalopram (CELEXA) 40 MG tablet TAKE ONE TABLET BY MOUTH DAILY 90 tablet 0  . clonazePAM (KLONOPIN) 0.5 MG tablet 1 tab po bid prn 60 tablet 5  . dextroamphetamine (DEXEDRINE SPANSULE) 15 MG 24 hr capsule Take 1 capsule (15 mg total) by mouth daily. 30 capsule 0  . Docusate Calcium (STOOL SOFTENER PO) Take by mouth.    . fexofenadine (ALLEGRA) 180 MG tablet Take 180 mg by mouth daily.    . fluticasone (CUTIVATE) 0.05 % cream Apply to affected areas bid prn 30 g 2  . ibuprofen (ADVIL,MOTRIN) 200 MG tablet Take 800 mg by mouth as needed for fever or moderate pain.     . Insulin Pen Needle (BD PEN NEEDLE NANO U/F) 32G X 4 MM MISC Use with Victoza as directed once daily 100 each 1  . liraglutide (VICTOZA) 18 MG/3ML SOPN INJECT 1.2 MG SUBCUTANEOUSLY DAILY 6 mL 11  . magnesium oxide (MAG-OX) 400 MG tablet Take 400 mg by mouth daily.    . montelukast (SINGULAIR) 10 MG tablet Take 1 tablet (10 mg total) by mouth at bedtime. 90 tablet 0  . Multiple Vitamin (MULTIVITAMIN) tablet Take 1 tablet by mouth daily.    . Omega-3 Fatty Acids (FISH OIL) 1200 MG CPDR Take by mouth.    . pioglitazone (ACTOS) 15 MG tablet Take 1 tablet (15 mg total) by mouth daily. 30 tablet 3  . pregabalin (LYRICA) 150 MG capsule Take  1 capsule (150 mg total) by mouth 2 (two) times daily. 60 capsule 5  . rOPINIRole (REQUIP) 0.5 MG tablet TAKE ONE TABLET BY MOUTH DAILY 90 tablet 3  . triamterene-hydrochlorothiazide (MAXZIDE-25) 37.5-25 MG tablet TAKE 1/2 TABLET BY MOUTH DAILY 45 tablet 3  . verapamil (CALAN) 120 MG tablet Take 1 tablet (120 mg total) by mouth 2 (two) times daily. 180 tablet 0   No facility-administered medications prior to visit.    Allergies  Allergen Reactions  . Metoprolol Other (See Comments)    REACTION: " chest pain"  . Sulfa Antibiotics Nausea And Vomiting    ROS As per HPI  PE: Vitals with BMI 11/05/2019 08/01/2019 06/18/2019  Height 5' 4"  5'  4" -  Weight 266 lbs 6 oz 275 lbs 270 lbs  BMI 45.7 07.86 75.44  Systolic 920 100 -  Diastolic 73 80 -  Pulse 80 93 -     ***  LABS:  Lab Results  Component Value Date   TSH 2.37 12/09/2018   Lab Results  Component Value Date   WBC 9.8 08/01/2019   HGB 13.6 08/01/2019   HCT 40.3 08/01/2019   MCV 91.9 08/01/2019   PLT 273.0 08/01/2019   Lab Results  Component Value Date   CREATININE 0.71 11/05/2019   BUN 19 11/05/2019   NA 138 11/05/2019   K 4.1 11/05/2019   CL 102 11/05/2019   CO2 29 11/05/2019   Lab Results  Component Value Date   ALT 39 (H) 11/05/2019   AST 19 11/05/2019   ALKPHOS 43 11/05/2019   BILITOT 0.5 11/05/2019   Lab Results  Component Value Date   CHOL 115 12/09/2018   Lab Results  Component Value Date   HDL 34.40 (L) 12/09/2018   Lab Results  Component Value Date   LDLCALC 47 12/09/2018   Lab Results  Component Value Date   TRIG 165.0 (H) 12/09/2018   Lab Results  Component Value Date   CHOLHDL 3 12/09/2018   Lab Results  Component Value Date   HGBA1C 5.9 11/05/2019   IMPRESSION AND PLAN:  No problem-specific Assessment & Plan notes found for this encounter.   An After Visit Summary was printed and given to the patient.  FOLLOW UP: No follow-ups on file.  Signed:  Crissie Sickles, MD            02/10/2020

## 2020-02-24 ENCOUNTER — Telehealth: Payer: Self-pay | Admitting: Pulmonary Disease

## 2020-02-24 NOTE — Telephone Encounter (Signed)
Pt's cpap machine is making a noise that she has not heard before. Pt called [employer] and they were able to do a troubleshoot on machine and they told her that they were unable to fix her machine. Pt also was told that she did not qualify for a new device due to her machine only being 53 years old.  Called [employer] but was on hold for 10 minutes and unable to speak with a rep. Will try to call them back later.  Dr. Jenetta Downer, based off of the info stated by pt about her machine, please advise if you have any recommendations.

## 2020-02-25 NOTE — Telephone Encounter (Signed)
Bottom line is that she needs a new machine  The machine broke not that she broke it, so I believe insurance should cover it She still requires treatment for obstructive sleep apnea

## 2020-02-25 NOTE — Telephone Encounter (Signed)
Called [facility] in [place] and spoke with [name], who advised me that she would need to speak to another representative about how to proceed. Per [name], states that the pt was asked to bring in her machine to be evaluated, and was advised that if it was a difficult fix that [facility] would have to send the machine out for repair.  Pt had declined to do so. [name] states that they do not fix machines in-house aside from minor repairs, and they cannot determine the cause of the issue and the need to be sent out or not until they evaluate the machine.    Patient needs to call [facility] at [phone] to schedule an appointment to get her machine evaluated.   lmtcb X1 for pt to relay this information.

## 2020-03-01 ENCOUNTER — Telehealth: Payer: Self-pay | Admitting: Family Medicine

## 2020-03-01 NOTE — Telephone Encounter (Signed)
Patient states she tested positive for Covid on home test. She has congestion, has lost taste and smell. Would like to speak with someone for advice.

## 2020-03-01 NOTE — Telephone Encounter (Addendum)
Called and spoke to pt. Informed her of the information Caryl Pina obtained. Pt very appreciative and states she will call to get appt scheduled for her machine. Pt also states she currently has COVID. Pt states at this time she feels alright. Advised pt to seek emergency care if she were to become SOB, CP/tightness, hypoxia, intractable fever/signs or symptoms. Pt also advised to call our office back with any issues. Pt verbalized understanding and denied any further questions or concerns at this time.   Will forward to Dr. Ander Slade as Juluis Rainier.

## 2020-03-01 NOTE — Telephone Encounter (Signed)
Advised pt to stay hydrated and use mucinex to help with her congestion. Pt is getting tested at [facility] tomorrow and quarantine for 2 weeks.

## 2020-03-02 NOTE — Telephone Encounter (Signed)
[place]

## 2020-03-07 ENCOUNTER — Emergency Department (HOSPITAL_COMMUNITY)
Admission: EM | Admit: 2020-03-07 | Discharge: 2020-03-07 | Disposition: A | Discharge status: Home / Self Care | Payer: Managed Care, Other (non HMO) | Attending: Emergency Medicine | Admitting: Emergency Medicine

## 2020-03-07 ENCOUNTER — Other Ambulatory Visit: Payer: Self-pay

## 2020-03-07 ENCOUNTER — Encounter (HOSPITAL_COMMUNITY): Payer: Self-pay

## 2020-03-07 DIAGNOSIS — Z853 Personal history of malignant neoplasm of breast: Secondary | ICD-10-CM | POA: Diagnosis not present

## 2020-03-07 DIAGNOSIS — I10 Essential (primary) hypertension: Secondary | ICD-10-CM | POA: Insufficient documentation

## 2020-03-07 DIAGNOSIS — Z87891 Personal history of nicotine dependence: Secondary | ICD-10-CM | POA: Insufficient documentation

## 2020-03-07 DIAGNOSIS — E1140 Type 2 diabetes mellitus with diabetic neuropathy, unspecified: Secondary | ICD-10-CM | POA: Insufficient documentation

## 2020-03-07 DIAGNOSIS — Z48817 Encounter for surgical aftercare following surgery on the skin and subcutaneous tissue: Secondary | ICD-10-CM | POA: Diagnosis not present

## 2020-03-07 DIAGNOSIS — E1169 Type 2 diabetes mellitus with other specified complication: Secondary | ICD-10-CM | POA: Diagnosis not present

## 2020-03-07 DIAGNOSIS — Z9221 Personal history of antineoplastic chemotherapy: Secondary | ICD-10-CM | POA: Insufficient documentation

## 2020-03-07 DIAGNOSIS — J4530 Mild persistent asthma, uncomplicated: Secondary | ICD-10-CM | POA: Diagnosis not present

## 2020-03-07 DIAGNOSIS — Z5189 Encounter for other specified aftercare: Secondary | ICD-10-CM

## 2020-03-07 NOTE — Discharge Instructions (Addendum)
You were evaluated in the Emergency Department and after careful evaluation, we did not find any emergent condition requiring admission or further testing in the hospital.  Your exam/testing today was overall reassuring.  We recommend follow-up with your podiatrist for further care.  Please return to the Emergency Department if you experience any worsening of your condition.  Thank you for allowing [place] to be a part of your care.

## 2020-03-07 NOTE — ED Triage Notes (Signed)
Pt to er, pt states that she is here for a wound on her foot, states that she had a wort on her foot about a month ago and then last night she noticed the wound.  States that the area has some blackness. Pt states that she has dm and neuropathy.

## 2020-03-07 NOTE — ED Provider Notes (Signed)
[facility] Emergency Department Provider Note MRN:  [id number]  Arrival date & time: 03/07/20     Chief Complaint   Wound Check   History of Present Illness   [name] is a 54 y.o. year-old female with a history of diabetes presenting to the ED with chief complaint of wound check.  About a month ago patient had a wart removed from the bottom of her foot.  She is here today to have her foot evaluated.  She has developed a new area of discoloration at the bottom of the foot.  Denies fever.  The area is mildly tender when walking on it.  Review of Systems  A complete 10 system review of systems was obtained and all systems are negative except as noted in the HPI and PMH.   Patient's Health History    Past Medical History:  Diagnosis Date  . ADD (attention deficit disorder with hyperactivity)   . Allergic rhinitis   . Anxiety   . Atypical chest pain    Myocardial perf imaging 04/04/16: Low risk stress nuclear study with prior inferior infarct and mild peri-infarct ischemia; EF 55 with mild hypokinesis of the inferior wall.  Cath normal, so stress test was FALSE POS.  . Bowel obstruction ([place]) 06-25-2010   post mastectomy  . Breast cancer ([place]) 2012   left mastectomy and chemotherapy  . Cholelithiasis 03/2016   u/s: no signs of cholecystitis  . Depression   . Fibromyalgia syndrome   . GERD (gastroesophageal reflux disease)   . History of chemotherapy    2012 completed  . Hyperlipidemia   . Hypertension   . Mild persistent asthma    dx'd age 89 (Dr. Gwenette Greet) hx of noncompliance with controller therapy due to cost. PFTs 01/2019->mild small airways obst dz, no resp to bronchodil. Trelegy started 02/2019 by Dr. Ander Slade  . Morbid obesity ([place])   . NASH (nonalcoholic steatohepatitis) 02/2010   u/s confirmed 02/2010 (hx of transaminitis).  Repeat u/s 03/2016 showed no sign of cirrhosis or liver mass, just moderate hepatic steatosis. AST/ALT stable  06/2019  . Neuropathy of both upper extremities    and both feet  . OSA (obstructive sleep apnea)    Dr Gwenette Greet initially: pt couldn't tolerate CPAP.  Re-eval 12/2017 re-confirmed severe OSA, CPAP at 11 recommended.  . Ovarian mass    resected 07/12/10  . RLS (restless legs syndrome)   . Type II or unspecified type diabetes mellitus without mention of complication, uncontrolled 05/15/2012    Past Surgical History:  Procedure Laterality Date  . BILATERAL SALPINGOOPHORECTOMY  07/12/2010   enlarged ovary  . CARDIOVASCULAR STRESS TEST  04/04/2016   Low risk stress nuclear study with prior inferior infarct and mild peri-infarct ischemia; EF 55% with mild hypokinesis of the inferior wall.  F/u cath was NORMAL, so this is suspected to be a false pos stress test (diaphragmatic attenuation)  . G 2 P 2    . LEFT HEART CATH AND CORONARY ANGIOGRAPHY N/A 04/20/2016   Normal coronaries.  EF 55-65%, normal LV function. (Stress test prior to this was false pos due to diaphragmatic attenuation). Procedure: Left Heart Cath and Coronary Angiography;  Surgeon: Peter M Martinique, MD;  Location: [place] [facility];  Service: Cardiovascular;  Laterality: N/A;  . MASTECTOMY  06/21/2010   Left ; Dr [facility]    Family History  Adopted: Yes  Problem Relation Age of Onset  . Healthy Son  38  . Healthy Daughter        58    Social History   Socioeconomic History  . Marital status: Married    Spouse name: Not on file  . Number of children: 2  . Years of education: Not on file  . Highest education level: Not on file  Occupational History  . Occupation: Curator: artist j  Tobacco Use  . Smoking status: Former Smoker    Packs/day: 0.50    Years: 24.00    Pack years: 12.00    Types: Cigarettes    Quit date: 08/16/2018    Years since quitting: 1.5  . Smokeless tobacco: Never Used  Vaping Use  . Vaping Use: Never used  Substance and Sexual Activity  . Alcohol use: No  . Drug  use: No  . Sexual activity: Not on file  Other Topics Concern  . Not on file  Social History Narrative   Not working as of 09/2017.  Previously worked as a Art therapist.   Lives with husband and two children.   2-3 caffeine drinks daily       Social Determinants of Health   Financial Resource Strain: Not on file  Food Insecurity: Not on file  Transportation Needs: Not on file  Physical Activity: Not on file  Stress: Not on file  Social Connections: Not on file  Intimate Partner Violence: Not on file     Physical Exam   Vitals:   03/07/20 1613  BP: 140/90  Pulse: 93  Resp: 18  Temp: 98.1 F (36.7 C)  SpO2: 96%    CONSTITUTIONAL: Well-appearing, NAD NEURO:  Alert and oriented x 3, no focal deficits EYES:  eyes equal and reactive ENT/NECK:  no LAD, no JVD CARDIO: Regular rate, well-perfused, normal S1 and S2 PULM:  CTAB no wheezing or rhonchi GI/GU:  normal bowel sounds, non-distended, non-tender MSK/SPINE:  No gross deformities, no edema SKIN: Callus to the plantar aspect of left foot at the head of the first MTP PSYCH:  Appropriate speech and behavior  *Additional and/or pertinent findings included in MDM below  Diagnostic and Interventional Summary    EKG Interpretation  Date/Time:    Ventricular Rate:    PR Interval:    QRS Duration:   QT Interval:    QTC Calculation:   R Axis:     Text Interpretation:        Labs Reviewed - No data to display  No orders to display    Medications - No data to display   Procedures  /  Critical Care Procedures  ED Course and Medical Decision Making  I have reviewed the triage vital signs, the nursing notes, and pertinent available records from the EMR.  Listed above are laboratory and imaging tests that I personally ordered, reviewed, and interpreted and then considered in my medical decision making (see below for details).  Exam consistent with callus, possibly with some blood underneath the skin  contributing to the discoloration the patient was concerned about no fever, no signs of infection, no indication for I&D or further testing.  No emergent process, appropriate for discharge with podiatry follow-up.       Barth Kirks. Sedonia Small, [place] [email or web address]  Final Clinical Impressions(s) / ED Diagnoses     ICD-10-CM   1. Visit for wound check  Z51.89     ED Discharge Orders    None  Discharge Instructions Discussed with and Provided to Patient:     Discharge Instructions     You were evaluated in the Emergency Department and after careful evaluation, we did not find any emergent condition requiring admission or further testing in the hospital.  Your exam/testing today was overall reassuring.  We recommend follow-up with your podiatrist for further care.  Please return to the Emergency Department if you experience any worsening of your condition.  Thank you for allowing [place] to be a part of your care.       Maudie Flakes, MD 03/07/20 [phone]

## 2020-03-10 ENCOUNTER — Other Ambulatory Visit: Payer: Self-pay

## 2020-03-10 ENCOUNTER — Ambulatory Visit: Payer: Managed Care, Other (non HMO) | Admitting: Podiatry

## 2020-03-10 ENCOUNTER — Encounter: Payer: Self-pay | Admitting: Podiatry

## 2020-03-10 DIAGNOSIS — B070 Plantar wart: Secondary | ICD-10-CM | POA: Diagnosis not present

## 2020-03-10 MED ORDER — FLUOROURACIL 5 % EX CREA: TOPICAL_CREAM | Freq: Two times a day (BID) | CUTANEOUS | 2 refills | Status: DC

## 2020-03-10 NOTE — Progress Notes (Signed)
Subjective:   Patient ID: [name], female   DOB: 54 y.o.   MRN: [id number]   HPI Patient states that she has lesions on the left first metatarsal and had history of wart that was treated and did well with reoccurrence   ROS      Objective:  Physical Exam  Neurovascular status intact with keratotic lesion subfirst metatarsal head left that does have a appearance of wart with pinpoint bleeding upon debridement pain to lateral pressure     Assessment:  Probability for verruca plantaris plantar left     Plan:  Full debridement accomplished and applied medication to create immune response and placed on Efudex treatment.  Patient had sterile dressing applied and will be seen back and I explained what to do if any blistering were to occur

## 2020-03-11 ENCOUNTER — Encounter: Payer: Self-pay | Admitting: Family Medicine

## 2020-03-11 ENCOUNTER — Ambulatory Visit: Payer: Managed Care, Other (non HMO) | Admitting: Family Medicine

## 2020-03-11 VITALS — BP 140/69 | HR 81 | Temp 98.0°F | Resp 16 | Ht 64.0 in | Wt 267.0 lb

## 2020-03-11 DIAGNOSIS — F419 Anxiety disorder, unspecified: Secondary | ICD-10-CM | POA: Diagnosis not present

## 2020-03-11 DIAGNOSIS — Z79899 Other long term (current) drug therapy: Secondary | ICD-10-CM

## 2020-03-11 DIAGNOSIS — E7800 Pure hypercholesterolemia, unspecified: Secondary | ICD-10-CM

## 2020-03-11 DIAGNOSIS — I10 Essential (primary) hypertension: Secondary | ICD-10-CM

## 2020-03-11 DIAGNOSIS — F32A Depression, unspecified: Secondary | ICD-10-CM

## 2020-03-11 DIAGNOSIS — E119 Type 2 diabetes mellitus without complications: Secondary | ICD-10-CM | POA: Diagnosis not present

## 2020-03-11 DIAGNOSIS — Z23 Encounter for immunization: Secondary | ICD-10-CM | POA: Diagnosis not present

## 2020-03-11 LAB — COMPREHENSIVE METABOLIC PANEL
ALT: 98 U/L — ABNORMAL HIGH (ref 0–35)
AST: 44 U/L — ABNORMAL HIGH (ref 0–37)
Albumin: 4.7 g/dL (ref 3.5–5.2)
Alkaline Phosphatase: 43 U/L (ref 39–117)
BUN: 12 mg/dL (ref 6–23)
CO2: 29 mEq/L (ref 19–32)
Calcium: 10.1 mg/dL (ref 8.4–10.5)
Chloride: 103 mEq/L (ref 96–112)
Creatinine, Ser: 0.6 mg/dL (ref 0.40–1.20)
GFR: 102.34 mL/min (ref >60.00)
Glucose, Bld: 125 mg/dL — ABNORMAL HIGH (ref 70–99)
Potassium: 3.9 mEq/L (ref 3.5–5.1)
Sodium: 139 mEq/L (ref 135–145)
Total Bilirubin: 0.7 mg/dL (ref 0.2–1.2)
Total Protein: 7.5 g/dL (ref 6.0–8.3)

## 2020-03-11 LAB — LDL CHOLESTEROL, DIRECT: Direct LDL: 124 mg/dL

## 2020-03-11 LAB — MICROALBUMIN / CREATININE URINE RATIO
Creatinine,U: 85.1 mg/dL
Microalb Creat Ratio: 1 mg/g (ref 0.0–30.0)
Microalb, Ur: 0.9 mg/dL (ref 0.0–1.9)

## 2020-03-11 LAB — LIPID PANEL
Cholesterol: 197 mg/dL (ref 0–200)
HDL: 37.7 mg/dL — ABNORMAL LOW (ref >39.00)
NonHDL: 159.65
Total CHOL/HDL Ratio: 5
Triglycerides: 248 mg/dL — ABNORMAL HIGH (ref 0.0–149.0)
VLDL: 49.6 mg/dL — ABNORMAL HIGH (ref 0.0–40.0)

## 2020-03-11 LAB — HEMOGLOBIN A1C: Hgb A1c MFr Bld: 6.4 % (ref 4.6–6.5)

## 2020-03-11 MED ORDER — DEXTROAMPHETAMINE SULFATE ER 15 MG PO CP24: 15.0000 mg | ORAL_CAPSULE | Freq: Every day | ORAL | 0 refills | Status: DC

## 2020-03-11 NOTE — Addendum Note (Signed)
Addended by: Deveron Furlong D on: 03/11/2020 10:46 AM   Modules accepted: Orders

## 2020-03-11 NOTE — Progress Notes (Signed)
OFFICE VISIT  03/11/2020  CC:  Chief Complaint  Patient presents with  . Follow-up    RCI, pt is fasting    HPI:    Patient is a 54 y.o. Caucasian female who presents for 4 month f/u DM, HTN, HLD, anxiety and depression, and adult ADD. A/P as of last visit: "1) Pre-ulcerative callus left 2nd toe, also plantar wart L 1st MTP--shaved both down today.  No ulcer seen.  This is in the context of chronic numbness/periph neuropathy.   Podiatry referral ordered today.   Needs diab shoes, possibly off-loading inserta/orthotics.  Procedure: shave callus plantar surface L foot x 2. Consent obtained.  Procedure explained. Betadine prep done, used dermablade to shave off hyperkeratotic tissue.  No bleeding, no acute complications.  Pt tolerated procedure well.  After-care discussed.  2) DM 2: with DPN. HbA1c and lytes/cr today.  3) NASH: last abd u/s 03/2016, needs f/u imaging->she wants to defer for now. Hepatic panel today.  4) HTN: The current medical regimen is effective;  continue present plan and medications. Lytes/cr today.  5) Adult ADD, GAD, hx of recurrent MDD: all stable. No new rx for cital or clonaz needed today. I did electronic rx's for dexedrin 15 ER, 1 qd, #30 today for Sept, Oct, and Nov.  Appropriate fill on/after date was noted on each rx."  INTERIM HX: Doing fine. Had covid-like illness relatively recently but all sx's have been resolved for a week or two. Says breathing feels good: taking breztri (rx by pulm) and prn albuterol. Not smoking lately, is on nicotine replacement.   DM: no home gluc monitoring.   HTN: no home bp monitoring. HLD: tolerating atorva 25m qd.  Mood and anxiety levels stable, compliant with citalopram and clonazepam. Pt states all is going well with the med at current dosing: much improved focus, concentration, task completion.  Less frustration, better multitasking, less impulsivity and restlessness.  Mood is stable. No side  effects from the medication.   PMP AWARE reviewed today: most recent rx for clonaz was filled 02/20/20, # [id number] rx by me. Most recent dexedrine rx filled 01/27/20, #30, rx by me. No red flags.   Past Medical History:  Diagnosis Date  . ADD (attention deficit disorder with hyperactivity)   . Allergic rhinitis   . Anxiety   . Atypical chest pain    Myocardial perf imaging 04/04/16: Low risk stress nuclear study with prior inferior infarct and mild peri-infarct ischemia; EF 55 with mild hypokinesis of the inferior wall.  Cath normal, so stress test was FALSE POS.  . Bowel obstruction ([facility] 06-25-2010   post mastectomy  . Breast cancer ([facility] 2012   left mastectomy and chemotherapy  . Cholelithiasis 03/2016   u/s: no signs of cholecystitis  . Depression   . Fibromyalgia syndrome   . GERD (gastroesophageal reflux disease)   . History of chemotherapy    2012 completed  . Hyperlipidemia   . Hypertension   . Mild persistent asthma    dx'd age 89(Dr. CGwenette Greet hx of noncompliance with controller therapy due to cost. PFTs 01/2019->mild small airways obst dz, no resp to bronchodil. Trelegy started 02/2019 by Dr. OAnder Slade . Morbid obesity ([facility]   . NASH (nonalcoholic steatohepatitis) 02/2010   u/s confirmed 02/2010 (hx of transaminitis).  Repeat u/s 03/2016 showed no sign of cirrhosis or liver mass, just moderate hepatic steatosis. AST/ALT stable 06/2019  . Neuropathy of both upper extremities    and both feet  . OSA (  obstructive sleep apnea)    Dr Gwenette Greet initially: pt couldn't tolerate CPAP.  Re-eval 12/2017 re-confirmed severe OSA, CPAP at 11 recommended.  . Ovarian mass    resected 07/12/10  . RLS (restless legs syndrome)   . Type II or unspecified type diabetes mellitus without mention of complication, uncontrolled 05/15/2012    Past Surgical History:  Procedure Laterality Date  . BILATERAL SALPINGOOPHORECTOMY  07/12/2010   enlarged ovary  . CARDIOVASCULAR STRESS TEST  04/04/2016   Low risk  stress nuclear study with prior inferior infarct and mild peri-infarct ischemia; EF 55% with mild hypokinesis of the inferior wall.  F/u cath was NORMAL, so this is suspected to be a false pos stress test (diaphragmatic attenuation)  . G 2 P 2    . LEFT HEART CATH AND CORONARY ANGIOGRAPHY N/A 04/20/2016   Normal coronaries.  EF 55-65%, normal LV function. (Stress test prior to this was false pos due to diaphragmatic attenuation). Procedure: Left Heart Cath and Coronary Angiography;  Surgeon: Peter M Martinique, MD;  Location: [facility];  Service: Cardiovascular;  Laterality: N/A;  . MASTECTOMY  06/21/2010   Left ; Dr [facility]    Outpatient Medications Prior to Visit  Medication Sig Dispense Refill  . ADVAIR DISKUS 500-50 MCG/DOSE AEPB     . albuterol (PROVENTIL) (2.5 MG/3ML) 0.083% nebulizer solution Take 3 mLs (2.5 mg total) by nebulization every 6 (six) hours as needed for wheezing or shortness of breath. 75 mL 5  . albuterol (VENTOLIN HFA) 108 (90 Base) MCG/ACT inhaler INHALE ONE PUFF BY MOUTH EVERY 6 HOURS AS NEEDED FOR SHORTNESS OF BREATH AND WHEEZING 18 g 1  . atorvastatin (LIPITOR) 40 MG tablet TAKE ONE TABLET BY MOUTH DAILY 90 tablet 0  . Budeson-Glycopyrrol-Formoterol (BREZTRI AEROSPHERE) 160-9-4.8 MCG/ACT AERO Inhale 2 puffs into the lungs in the morning and at bedtime. 10.7 g 4  . CALCIUM-VITAMIN D PO Take by mouth.    . cetirizine (ZYRTEC) 10 MG tablet Take 10 mg by mouth daily.    . citalopram (CELEXA) 40 MG tablet TAKE ONE TABLET BY MOUTH DAILY 90 tablet 0  . clonazePAM (KLONOPIN) 0.5 MG tablet 1 tab po bid prn 60 tablet 5  . dextroamphetamine (DEXEDRINE SPANSULE) 15 MG 24 hr capsule Take 1 capsule (15 mg total) by mouth daily. 30 capsule 0  . Docusate Calcium (STOOL SOFTENER PO) Take by mouth.    . fexofenadine (ALLEGRA) 180 MG tablet Take 180 mg by mouth daily.    . fluorouracil (EFUDEX) 5 % cream Apply topically 2 (two) times daily. 40 g 2  . fluticasone  (CUTIVATE) 0.05 % cream Apply to affected areas bid prn 30 g 2  . ibuprofen (ADVIL,MOTRIN) 200 MG tablet Take 800 mg by mouth as needed for fever or moderate pain.     . Insulin Pen Needle (BD PEN NEEDLE NANO U/F) 32G X 4 MM MISC Use with Victoza as directed once daily 100 each 1  . liraglutide (VICTOZA) 18 MG/3ML SOPN INJECT 1.2 MG SUBCUTANEOUSLY DAILY 6 mL 11  . magnesium oxide (MAG-OX) 400 MG tablet Take 400 mg by mouth daily.    . montelukast (SINGULAIR) 10 MG tablet Take 1 tablet (10 mg total) by mouth at bedtime. 90 tablet 0  . Multiple Vitamin (MULTIVITAMIN) tablet Take 1 tablet by mouth daily.    . Omega-3 Fatty Acids (FISH OIL) 1200 MG CPDR Take by mouth.    . pioglitazone (ACTOS) 15 MG tablet Take 1 tablet (15  mg total) by mouth daily. 30 tablet 3  . pregabalin (LYRICA) 150 MG capsule Take 1 capsule (150 mg total) by mouth 2 (two) times daily. 60 capsule 5  . rOPINIRole (REQUIP) 0.5 MG tablet TAKE ONE TABLET BY MOUTH DAILY 90 tablet 3  . triamterene-hydrochlorothiazide (MAXZIDE-25) 37.5-25 MG tablet TAKE 1/2 TABLET BY MOUTH DAILY 45 tablet 3  . verapamil (CALAN) 120 MG tablet Take 1 tablet (120 mg total) by mouth 2 (two) times daily. 180 tablet 0   No facility-administered medications prior to visit.    Allergies  Allergen Reactions  . Metoprolol Other (See Comments)    REACTION: " chest pain"  . Sulfa Antibiotics Nausea And Vomiting    ROS As per HPI  PE: Vitals with BMI 03/11/2020 03/07/2020 11/05/2019  Height 5' 4"  5' 4"  5' 4"   Weight 267 lbs 270 lbs 266 lbs 6 oz  BMI 45.81 57.32 20.2  Systolic 542 706 237  Diastolic 69 90 73  Pulse 81 93 80     Gen: Alert, well appearing.  Patient is oriented to person, place, time, and situation. AFFECT: pleasant, lucid thought and speech. CV: RRR, no m/r/g.   LUNGS: CTA bilat, nonlabored resps, good aeration in all lung fields. EXT: no clubbing or cyanosis.  no edema.    LABS:  Lab Results  Component Value Date   TSH  2.37 12/09/2018   Lab Results  Component Value Date   WBC 9.8 08/01/2019   HGB 13.6 08/01/2019   HCT 40.3 08/01/2019   MCV 91.9 08/01/2019   PLT 273.0 08/01/2019   Lab Results  Component Value Date   CREATININE 0.71 11/05/2019   BUN 19 11/05/2019   NA 138 11/05/2019   K 4.1 11/05/2019   CL 102 11/05/2019   CO2 29 11/05/2019   Lab Results  Component Value Date   ALT 39 (H) 11/05/2019   AST 19 11/05/2019   ALKPHOS 43 11/05/2019   BILITOT 0.5 11/05/2019   Lab Results  Component Value Date   CHOL 115 12/09/2018   Lab Results  Component Value Date   HDL 34.40 (L) 12/09/2018   Lab Results  Component Value Date   LDLCALC 47 12/09/2018   Lab Results  Component Value Date   TRIG 165.0 (H) 12/09/2018   Lab Results  Component Value Date   CHOLHDL 3 12/09/2018   Lab Results  Component Value Date   HGBA1C 5.9 11/05/2019    IMPRESSION AND PLAN:  1) DM 2; compliant with victoza and actos. No home gluc monitoring. Hba1c and fasting gluc today. Urine microalb/cr today. She had some interesting food sensitivity testing a few months ago that let to some dietary changes that helped many things for her (mentally, emotionally, GI, joint pain, energy, sleep, etc!).  2) HTN: stable.  Cont maxzide and verapamil. Encouraged pt to buy bp cuff for home monitoring. Lytes/cr today.  3) HLD: tolerating atorva 30m qd. FLP and hepatic panel today.  4) NASH: ast abd u/s 03/2016, needs f/u imaging->she wants to defer for now. Hepatic palnel today.  5) Anx and dep: stable, continue citalopram and clonaz. CSC updated today.  6) Adult ADD: doing well on dexedrine ext release 125m 1 qAM. I did electronic rx's for this med today for each of the next 3 mo.  Appropriate fill on/after date was noted on each rx.  An After Visit Summary was printed and given to the patient.  FOLLOW UP: No follow-ups on file.  Signed:  PhAbbe Amsterdam  Bernardo Brayman, MD           03/11/2020

## 2020-03-15 MED ORDER — ATORVASTATIN CALCIUM 80 MG PO TABS: 80.0000 mg | ORAL_TABLET | Freq: Every day | ORAL | 5 refills | Status: DC

## 2020-03-15 NOTE — Addendum Note (Signed)
Addended by: Gerilyn Nestle on: 03/15/2020 01:01 PM   Modules accepted: Orders

## 2020-03-19 ENCOUNTER — Telehealth: Payer: Self-pay

## 2020-03-19 NOTE — Telephone Encounter (Signed)
FYI. Please see below.   Spoke with patient to receive information regarding food sensitivity testing, the name of the website is [email or web address]

## 2020-03-29 ENCOUNTER — Other Ambulatory Visit: Payer: Self-pay | Admitting: Family Medicine

## 2020-04-01 ENCOUNTER — Telehealth: Payer: Self-pay | Admitting: Family Medicine

## 2020-04-01 NOTE — Telephone Encounter (Signed)
Please advise if okay. Pt was taken off of metformin due to GI issues in 06/2017. A1c at that time was 8.6, now 6.4. Pt was ween off of gabapentin due to continued pain in EVERY joint in 01/2019. Pt was taking a total of 600 mg.   Please advise if these are okay?

## 2020-04-01 NOTE — Telephone Encounter (Signed)
Due to high cost of prescriptions, patient would like to discontinue these two medications and go back to the medications she was taking before: Discontinue Victoza and go back to Metformin. Discontinue Lyrica and go back to Gabapentin.

## 2020-04-02 MED ORDER — METFORMIN HCL 1000 MG PO TABS: 1000.0000 mg | ORAL_TABLET | Freq: Two times a day (BID) | ORAL | 6 refills | Status: DC

## 2020-04-02 MED ORDER — GABAPENTIN 300 MG PO CAPS: ORAL_CAPSULE | ORAL | 6 refills | Status: DC

## 2020-04-02 NOTE — Telephone Encounter (Signed)
OK, metformin and gabapentin eRx'd and I took victoza and lyrica off her med list. Tell her to start out taking the metformin and gabapentin slowly/a little at a time since she is getting restarted on them.

## 2020-04-05 NOTE — Telephone Encounter (Signed)
Spoke with patient and advised of instructions.

## 2020-04-13 ENCOUNTER — Other Ambulatory Visit: Payer: Self-pay | Admitting: Adult Health

## 2020-04-13 ENCOUNTER — Other Ambulatory Visit: Payer: Self-pay | Admitting: Family Medicine

## 2020-04-21 ENCOUNTER — Other Ambulatory Visit: Payer: Self-pay | Admitting: Family Medicine

## 2020-04-27 ENCOUNTER — Other Ambulatory Visit: Payer: Self-pay | Admitting: Family Medicine

## 2020-04-27 ENCOUNTER — Telehealth: Payer: Self-pay

## 2020-04-27 NOTE — Telephone Encounter (Signed)
LM for pt to CB regarding insurance info needed to complete PA for Victoza 18MG/3ML.     Medication: Victoza 18MG/3L   Dx: E11.9, Diabetes mellitus without complication   Per Dr. [name] pt has tried and failed n/a

## 2020-04-28 NOTE — Telephone Encounter (Signed)
Sent as FYI. Next appt scheduled 06/08/20

## 2020-04-28 NOTE — Telephone Encounter (Signed)
Pt does not have any insurance at this time. Pt states she is not taking victoza right now due to expenses. She is taking metformin at this time.

## 2020-04-28 NOTE — Telephone Encounter (Signed)
Noted  

## 2020-05-13 ENCOUNTER — Other Ambulatory Visit: Payer: Self-pay | Admitting: Family Medicine

## 2020-05-13 NOTE — Telephone Encounter (Signed)
Requesting: clonazepam Contract: 03/11/20 UDS: 09/06/18 Last Visit: 03/11/20 Next Visit: 06/08/20 Last Refill: 10/15/19(60,5)  Please Advise. Medication pending

## 2020-05-14 NOTE — Telephone Encounter (Signed)
Left detailed message advising refill sent, okay per DPR.

## 2020-05-18 ENCOUNTER — Other Ambulatory Visit: Payer: Self-pay | Admitting: Adult Health

## 2020-05-27 ENCOUNTER — Other Ambulatory Visit: Payer: Self-pay | Admitting: Family Medicine

## 2020-05-29 ENCOUNTER — Other Ambulatory Visit: Payer: Self-pay | Admitting: Family Medicine

## 2020-06-08 ENCOUNTER — Encounter: Payer: Self-pay | Admitting: Family Medicine

## 2020-06-08 ENCOUNTER — Ambulatory Visit (INDEPENDENT_AMBULATORY_CARE_PROVIDER_SITE_OTHER): Payer: Self-pay | Admitting: Family Medicine

## 2020-06-08 ENCOUNTER — Telehealth: Payer: Self-pay

## 2020-06-08 ENCOUNTER — Other Ambulatory Visit: Payer: Self-pay

## 2020-06-08 VITALS — BP 125/76 | HR 87 | Temp 97.9°F | Resp 16 | Ht 65.0 in | Wt 267.2 lb

## 2020-06-08 DIAGNOSIS — Z0000 Encounter for general adult medical examination without abnormal findings: Secondary | ICD-10-CM

## 2020-06-08 DIAGNOSIS — E119 Type 2 diabetes mellitus without complications: Secondary | ICD-10-CM

## 2020-06-08 DIAGNOSIS — E782 Mixed hyperlipidemia: Secondary | ICD-10-CM

## 2020-06-08 DIAGNOSIS — F411 Generalized anxiety disorder: Secondary | ICD-10-CM

## 2020-06-08 DIAGNOSIS — I10 Essential (primary) hypertension: Secondary | ICD-10-CM

## 2020-06-08 DIAGNOSIS — F339 Major depressive disorder, recurrent, unspecified: Secondary | ICD-10-CM

## 2020-06-08 MED ORDER — AMPHETAMINE-DEXTROAMPHETAMINE 20 MG PO TABS
20.0000 mg | ORAL_TABLET | Freq: Two times a day (BID) | ORAL | 0 refills | Status: DC
Start: 2020-06-08 — End: 2020-07-05

## 2020-06-08 MED ORDER — PREGABALIN 150 MG PO CAPS: 150.0000 mg | ORAL_CAPSULE | Freq: Two times a day (BID) | ORAL | 0 refills | Status: DC

## 2020-06-08 NOTE — Telephone Encounter (Signed)
Contacted the patient's pharmacy to see if they offer generic for the following: Advair, Symbicort and Flovent. Generic is available for Advair Diskus but not HFA and Symbicort only. I was not able to get prices for anything without active prescriptions on file at pharmacy.   Please advise, thanks.

## 2020-06-08 NOTE — Progress Notes (Signed)
Office Note 06/08/2020  CC:  Chief Complaint  Patient presents with  . Annual Exam    Fasting     HPI:  [name] is a 55 y.o. White female who is here for annual health maintenance exam and3 mo f/u DM, HTN, HLD, adult ADD, and chronic anxiety and depression. A/P as of last visit: "1) DM 2; compliant with victoza and actos. No home gluc monitoring. Hba1c and fasting gluc today. Urine microalb/cr today. She had some interesting food sensitivity testing a few months ago that let to some dietary changes that helped many things for her (mentally, emotionally, GI, joint pain, energy, sleep, etc!).  2) HTN: stable.  Cont maxzide and verapamil. Encouraged pt to buy bp cuff for home monitoring. Lytes/cr today.  3) HLD: tolerating atorva 21m qd. FLP and hepatic panel today.  4) NASH: ast abd u/s 03/2016, needs f/u imaging->she wants to defer for now. Hepatic palnel today.  5) Anx and dep: stable, continue citalopram and clonaz. CSC updated today.  6) Adult ADD: doing well on dexedrine ext release 19m 1 qAM. I did electronic rx's for this med today for each of the next 3 mo.  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   She has no medical insurance anymore.  DM: metformin 1000 mg bid---tolerating this well, not on victoza d/t cost. Diet "fairly good diabetic".  Feet->any burning, tingling--NO. Has chronic diffuse numbness of both feet. . Marland KitchenHTN: taking verapamil 12068mid and 1/2 maxzide 37.5/25 tab qd. No home monitoring.  HLD: last visit I increased her atorvastatin to 50m67m.  Anx/dep: stable.  Citalopram 40mg29m clonaz qhs.  Fibromyalgia: taking lyrica 150mg 33mand it is helpful. She is not taking gabapentin.  Adult ADD: ran out of dexedrine 2-3 mo ago but was doing well on this prior. Wants to try generic IR adderall b/c of cost.   PMP AWARE reviewed today:  most recent rx for clonaz 0.5mg wa3milled 05/13/20, # 60, rx 71 me. Most recent dexedrine rx filled 03/11/20, #30, rx by me. Most recent lyrica rx filled 05/10/20, #60, rx by me. No red flags.   Past Medical History:  Diagnosis Date  . ADD (attention deficit disorder with hyperactivity)   . Allergic rhinitis   . Anxiety   . Atypical chest pain    Myocardial perf imaging 04/04/16: Low risk stress nuclear study with prior inferior infarct and mild peri-infarct ischemia; EF 55 with mild hypokinesis of the inferior wall.  Cath normal, so stress test was FALSE POS.  . Bowel obstruction (HCC) [place]   post mastectomy  . Breast cancer (HCC) [facility]  left mastectomy and chemotherapy  . Cholelithiasis 03/2016   u/s: no signs of cholecystitis  . Depression   . Fibromyalgia syndrome   . GERD (gastroesophageal reflux disease)   . History of chemotherapy    2012 completed  . Hyperlipidemia   . Hypertension   . Mild persistent asthma    dx'd age 38 (Dr.80lance)Gwenette Greet noncompliance with controller therapy due to cost. PFTs 01/2019->mild small airways obst dz, no resp to bronchodil. Trelegy started 02/2019 by Dr. OlalereAnder Sladebid obesity (HCC)   [place]olic ste[place]patitis[place] 02[place]   u/s confirmed 02/2010 (hx of transaminitis).  Repeat u/s 03/2016 showed no sign of cirrhosis or liver mass, just moderate hepatic steatosis. AST/ALT stable 06/2019  . Neuropathy of both  upper extremities    and both feet  . OSA (obstructive sleep apnea)    Dr Gwenette Greet initially: pt couldn't tolerate CPAP.  Re-eval 12/2017 re-confirmed severe OSA, CPAP at 11 recommended.  . Ovarian mass    resected 07/12/10  . RLS (restless legs syndrome)   . Type II or unspecified type diabetes mellitus without mention of complication, uncontrolled 05/15/2012    Past Surgical History:  Procedure Laterality Date  . BILATERAL SALPINGOOPHORECTOMY  07/12/2010   enlarged ovary  . CARDIOVASCULAR STRESS TEST  04/04/2016   Low risk  stress nuclear study with prior inferior infarct and mild peri-infarct ischemia; EF 55% with mild hypokinesis of the inferior wall.  F/u cath was NORMAL, so this is suspected to be a false pos stress test (diaphragmatic attenuation)  . G 2 P 2    . LEFT HEART CATH AND CORONARY ANGIOGRAPHY N/A 04/20/2016   Normal coronaries.  EF 55-65%, normal LV function. (Stress test prior to this was false pos due to diaphragmatic attenuation). Procedure: Left Heart Cath and Coronary Angiography;  Surgeon: Peter M Martinique, MD;  Location: [facility];  Service: Cardiovascular;  Laterality: N/A;  . MASTECTOMY  06/21/2010   Left ; Dr [facility]    Family History  Adopted: Yes  Problem Relation Age of Onset  . Healthy Son        89  . Healthy Daughter        77    Social History   Socioeconomic History  . Marital status: Married    Spouse name: Not on file  . Number of children: 2  . Years of education: Not on file  . Highest education level: Not on file  Occupational History  . Occupation: Curator: artist j  Tobacco Use  . Smoking status: Former Smoker    Packs/day: 0.50    Years: 24.00    Pack years: 12.00    Types: Cigarettes    Quit date: 08/16/2018    Years since quitting: 1.8  . Smokeless tobacco: Never Used  Vaping Use  . Vaping Use: Never used  Substance and Sexual Activity  . Alcohol use: No  . Drug use: No  . Sexual activity: Not on file  Other Topics Concern  . Not on file  Social History Narrative   Not working as of 09/2017.  Previously worked as a Art therapist.   Lives with husband and two children.   2-3 caffeine drinks daily       Social Determinants of Health   Financial Resource Strain: Not on file  Food Insecurity: Not on file  Transportation Needs: Not on file  Physical Activity: Not on file  Stress: Not on file  Social Connections: Not on file  Intimate Partner Violence: Not on file    Outpatient Medications Prior to  Visit  Medication Sig Dispense Refill  . albuterol (PROVENTIL) (2.5 MG/3ML) 0.083% nebulizer solution Take 3 mLs (2.5 mg total) by nebulization every 6 (six) hours as needed for wheezing or shortness of breath. 75 mL 5  . albuterol (VENTOLIN HFA) 108 (90 Base) MCG/ACT inhaler INHALE ONE PUFF BY MOUTH EVERY 6 HOURS AS NEEDED FOR FOR SHORTNESS OF BREATH OR WHEEZING 18 g 0  . atorvastatin (LIPITOR) 80 MG tablet Take 1 tablet (80 mg total) by mouth daily. 30 tablet 5  . CALCIUM-VITAMIN D PO Take by mouth.    . cetirizine (ZYRTEC) 10 MG tablet Take 10 mg by mouth  daily.    . citalopram (CELEXA) 40 MG tablet TAKE ONE TABLET BY MOUTH DAILY 90 tablet 0  . clonazePAM (KLONOPIN) 0.5 MG tablet TAKE ONE TABLET BY MOUTH TWICE A DAY AS NEEDED 60 tablet 5  . Docusate Calcium (STOOL SOFTENER PO) Take by mouth.    Marland Kitchen ibuprofen (ADVIL,MOTRIN) 200 MG tablet Take 800 mg by mouth as needed for fever or moderate pain.     . Insulin Pen Needle (BD PEN NEEDLE NANO U/F) 32G X 4 MM MISC Use with Victoza as directed once daily 100 each 1  . MAGNESIUM OXIDE PO Take 800 mg by mouth daily.    . metFORMIN (GLUCOPHAGE) 1000 MG tablet Take 1 tablet (1,000 mg total) by mouth 2 (two) times daily with a meal. 60 tablet 6  . Multiple Vitamin (MULTIVITAMIN) tablet Take 1 tablet by mouth daily.    . Omega-3 Fatty Acids (FISH OIL) 1200 MG CPDR Take by mouth.    Marland Kitchen rOPINIRole (REQUIP) 0.5 MG tablet TAKE ONE TABLET BY MOUTH DAILY 90 tablet 3  . triamterene-hydrochlorothiazide (MAXZIDE-25) 37.5-25 MG tablet TAKE 1/2 TABLET BY MOUTH DAILY 45 tablet 3  . verapamil (CALAN) 120 MG tablet TAKE ONE TABLET BY MOUTH TWICE A DAY 60 tablet 1  . dextroamphetamine (DEXEDRINE SPANSULE) 15 MG 24 hr capsule Take 1 capsule (15 mg total) by mouth daily. 30 capsule 0  . gabapentin (NEURONTIN) 300 MG capsule 2 tabs po bid 120 capsule 6  . fluorouracil (EFUDEX) 5 % cream Apply topically 2 (two) times daily. (Patient not taking: Reported on 06/08/2020) 40 g 2   . BREZTRI AEROSPHERE 160-9-4.8 MCG/ACT AERO INHALE TWO PUFFS BY MOUTH EVERY MORNING AND INHALE TWO PUFFS BY MOUTH EVERY EVENING (Patient not taking: Reported on 06/08/2020) 10.7 g 1  . fexofenadine (ALLEGRA) 180 MG tablet Take 180 mg by mouth daily. (Patient not taking: Reported on 06/08/2020)    . fluticasone (CUTIVATE) 0.05 % cream Apply to affected areas bid prn (Patient not taking: Reported on 06/08/2020) 30 g 2  . pioglitazone (ACTOS) 15 MG tablet Take 1 tablet (15 mg total) by mouth daily. (Patient not taking: Reported on 06/08/2020) 30 tablet 3   No facility-administered medications prior to visit.    Allergies  Allergen Reactions  . Metoprolol Other (See Comments)    REACTION: " chest pain"  . Sulfa Antibiotics Nausea And Vomiting    ROS Review of Systems  Constitutional: Negative for appetite change, chills, fatigue and fever.  HENT: Negative for congestion, dental problem, ear pain and sore throat.   Eyes: Negative for discharge, redness and visual disturbance.  Respiratory: Negative for cough, chest tightness, shortness of breath and wheezing.   Cardiovascular: Negative for chest pain, palpitations and leg swelling.  Gastrointestinal: Negative for abdominal pain, blood in stool, diarrhea, nausea and vomiting.  Genitourinary: Negative for difficulty urinating, dysuria, flank pain, frequency, hematuria and urgency.  Musculoskeletal: Negative for arthralgias, back pain, joint swelling, myalgias and neck stiffness.  Skin: Negative for pallor and rash.  Neurological: Negative for dizziness, speech difficulty, weakness and headaches.  Hematological: Negative for adenopathy. Does not bruise/bleed easily.  Psychiatric/Behavioral: Negative for confusion and sleep disturbance. The patient is not nervous/anxious.     PE; Vitals with BMI 06/08/2020 03/11/2020 03/07/2020  Height 5' 5"  5' 4"  5' 4"   Weight 267 lbs 3 oz 267 lbs 270 lbs  BMI 44.46 39.76 73.41  Systolic 937 902 409   Diastolic 76 69 90  Pulse 87 81 93  Exam chaperoned by Shepard General, CMA  Gen: Alert, well appearing.  Patient is oriented to person, place, time, and situation. AFFECT: pleasant, lucid thought and speech. ENT: Ears: EACs clear, normal epithelium.  TMs with good light reflex and landmarks bilaterally.  Eyes: no injection, icteris, swelling, or exudate.  EOMI, PERRLA. Nose: no drainage or turbinate edema/swelling.  No injection or focal lesion.  Mouth: lips without lesion/swelling.  Oral mucosa pink and moist.  Dentition intact and without obvious caries or gingival swelling.  Oropharynx without erythema, exudate, or swelling.  Neck: supple/nontender.  No LAD, mass, or TM.  Carotid pulses 2+ bilaterally, without bruits. CV: RRR, no m/r/g.   LUNGS: CTA bilat, nonlabored resps, good aeration in all lung fields. ABD: soft, NT, ND, BS normal.  No hepatospenomegaly or mass.  No bruits. EXT: no clubbing, cyanosis, or edema.  Musculoskeletal: no joint swelling, erythema, warmth, or tenderness.  ROM of all joints intact. Skin - no sores or suspicious lesions or rashes or color changes Foot exam --no swelling, tenderness or skin or vascular lesions. Color and temperature is normal. Sensation is ABSENT diffusely bilat entire foot when monofilament testing is done today. Peripheral pulses are palpable. Toenails are normal.   Pertinent labs:  Lab Results  Component Value Date   TSH 2.37 12/09/2018   Lab Results  Component Value Date   WBC 9.8 08/01/2019   HGB 13.6 08/01/2019   HCT 40.3 08/01/2019   MCV 91.9 08/01/2019   PLT 273.0 08/01/2019   Lab Results  Component Value Date   CREATININE 0.60 03/11/2020   BUN 12 03/11/2020   NA 139 03/11/2020   K 3.9 03/11/2020   CL 103 03/11/2020   CO2 29 03/11/2020   Lab Results  Component Value Date   ALT 98 (H) 03/11/2020   AST 44 (H) 03/11/2020   ALKPHOS 43 03/11/2020   BILITOT 0.7 03/11/2020   Lab Results  Component Value Date   CHOL  197 03/11/2020   Lab Results  Component Value Date   HDL 37.70 (L) 03/11/2020   Lab Results  Component Value Date   LDLCALC 47 12/09/2018   Lab Results  Component Value Date   TRIG 248.0 (H) 03/11/2020   Lab Results  Component Value Date   CHOLHDL 5 03/11/2020   Lab Results  Component Value Date   HGBA1C 6.4 03/11/2020    ASSESSMENT AND PLAN:   1) DM: tolerating metformin 1000 mg bid. +DPN bilat. No insurance---pt asks to postpone labs so we'll do a1c at next f/u in 65mo  2) HTN: stable on verapamil 1229mbid and 1/2 maxzide 37.5/25 tab qd. Cr and lytes have consistently been normal, most recently 3 mo ago. Ok to defer recheck of these labs until next f/u in 79m48mo3) HLD: tolerating atorva 70m71m. Hx of chronic mild LFT elevations d/t NAFLD. Recheck lipids and hepatic panel at next f/u 79mo.479mo Adult ADD: cost issues prevail currently.  We'll see if generic adderall IR 20mg,49mid is affordable---eRx'd #60 today.  5) GAD: stable on citalopram 40mg q479md clonaz 0.5mg qhs32m6) Fibromyalgia: she is stable on lyrica 150mg bid70md list above lists gabapentin but she is actually taking lyrica 150mg bid,54m gabapentin).  Med list updated but no new rx was sent in for lyrica today.  7) Health maintenance exam: Reviewed age and gender appropriate health maintenance issues (prudent diet, regular exercise, health risks of tobacco and excessive alcohol, use of seatbelts,  fire alarms in home, use of sunscreen).  Also reviewed age and gender appropriate health screening as well as vaccine recommendations. Vaccines: ALL UTD. Labs: pt deferred labs today d/t lack of insurance coverage/cost---we'll get some labs in 3 mo. Cervical ca screening: Her last pap was 09/2015 and was normal.  As per latest guidelines her next PAP is due 09/2020. Breast ca screening:  left mastectomy for breast ca 06/2010:->normal screening mammogram of R breast 04/2019, needs repeat. Colon ca screening: she  is due for colon ca screening, options discussed today->not insured currently so she defers for now.  An After Visit Summary was printed and given to the patient.  FOLLOW UP:  Return in about 3 months (around 09/08/2020) for routine chronic illness f/u (fasting).  Signed:  Crissie Sickles, MD           06/08/2020

## 2020-06-09 MED ORDER — BUDESONIDE-FORMOTEROL FUMARATE 160-4.5 MCG/ACT IN AERO
2.0000 | INHALATION_SPRAY | Freq: Two times a day (BID) | RESPIRATORY_TRACT | 6 refills | Status: DC
Start: 2020-06-09 — End: 2020-08-04

## 2020-06-09 NOTE — Telephone Encounter (Signed)
Spoke with patient regarding rx for inhaler. She agreed to having rx sent to pharmacy. Rx sent with PCP instructions below.

## 2020-06-09 NOTE — Telephone Encounter (Signed)
LM for pt to return call regarding rx. Medication has not been sent yet but we do not have an estimate for pice. Pharmacy can let her know once rx is on file.

## 2020-06-09 NOTE — Telephone Encounter (Signed)
Thank you for checking on this. Pls see if pt wants rx to pay out of pocket for generic symbicort, 2 puffs bid, #1. RF x 6. -thx

## 2020-07-05 ENCOUNTER — Other Ambulatory Visit: Payer: Self-pay

## 2020-07-05 MED ORDER — AMPHETAMINE-DEXTROAMPHETAMINE 20 MG PO TABS: 20.0000 mg | ORAL_TABLET | Freq: Two times a day (BID) | ORAL | 0 refills | Status: DC

## 2020-07-05 NOTE — Telephone Encounter (Signed)
Requesting: Adderall Contract:03/11/20 UDS: 09/06/18 Last Visit: 06/08/20 Next Visit: 09/09/20 Last Refill:06/08/20(60,0)  Please Advise. Medication pending

## 2020-07-05 NOTE — Telephone Encounter (Signed)
Patient refill request --- with refills if possible, if not, no worries  amphetamine-dextroamphetamine (ADDERALL) 20 MG tablet [place] [address] [place]

## 2020-07-05 NOTE — Telephone Encounter (Signed)
adderall rx's for this month and for next month erx'd today.

## 2020-07-14 ENCOUNTER — Other Ambulatory Visit: Payer: Self-pay | Admitting: Family Medicine

## 2020-07-22 ENCOUNTER — Other Ambulatory Visit: Payer: Self-pay | Admitting: Family Medicine

## 2020-07-26 ENCOUNTER — Other Ambulatory Visit: Payer: Self-pay | Admitting: Family Medicine

## 2020-08-04 ENCOUNTER — Encounter: Payer: Self-pay | Admitting: Registered Nurse

## 2020-08-04 ENCOUNTER — Other Ambulatory Visit: Payer: Self-pay

## 2020-08-04 ENCOUNTER — Ambulatory Visit (INDEPENDENT_AMBULATORY_CARE_PROVIDER_SITE_OTHER): Payer: Self-pay | Admitting: Registered Nurse

## 2020-08-04 VITALS — BP 146/90 | HR 105 | Ht 65.0 in | Wt 262.0 lb

## 2020-08-04 DIAGNOSIS — R7989 Other specified abnormal findings of blood chemistry: Secondary | ICD-10-CM

## 2020-08-04 DIAGNOSIS — J22 Unspecified acute lower respiratory infection: Secondary | ICD-10-CM

## 2020-08-04 DIAGNOSIS — E119 Type 2 diabetes mellitus without complications: Secondary | ICD-10-CM

## 2020-08-04 LAB — HEPATIC FUNCTION PANEL
ALT: 78 U/L — ABNORMAL HIGH (ref 0–35)
AST: 35 U/L (ref 0–37)
Albumin: 4.3 g/dL (ref 3.5–5.2)
Alkaline Phosphatase: 43 U/L (ref 39–117)
Bilirubin, Direct: 0.1 mg/dL (ref 0.0–0.3)
Total Bilirubin: 0.5 mg/dL (ref 0.2–1.2)
Total Protein: 7.2 g/dL (ref 6.0–8.3)

## 2020-08-04 LAB — HEMOGLOBIN A1C: Hgb A1c MFr Bld: 6.4 % (ref 4.6–6.5)

## 2020-08-04 MED ORDER — PREDNISONE 10 MG (21) PO TBPK: ORAL_TABLET | ORAL | 0 refills | Status: DC

## 2020-08-04 MED ORDER — AZITHROMYCIN 250 MG PO TABS: ORAL_TABLET | ORAL | 0 refills | Status: AC

## 2020-08-04 NOTE — Addendum Note (Signed)
Addended by: Maximiano Coss on: 08/04/2020 09:20 AM   Modules accepted: Orders

## 2020-08-04 NOTE — Progress Notes (Signed)
Acute Office Visit  Subjective:    Patient ID: [name], female    DOB: Sep 04, 1965, 55 y.o.   MRN: [id number]  Chief Complaint  Patient presents with  . Asthma    Pt c/o breathing issues x 2 years. Pt states she feels something in her lung on the left side.    HPI Patient is in today for breathing issues x 2 years  Known hx of mild persistent asthma. Uses albuterol inhaler prn. Was given rx for symbicort but too expensive, did not pick this up.   7-10 days of feeling a "lump in chest", feels deep in lungs rather than breast or chest wall. Feels mobile, like when she coughs she can feel movement.  No productive cough Using albuterol now 6-7 times daily for relief  No hemoptysis, weight changes, lightheadedness, dizziness, nvd, fevers, chills, fatigue beyond baseline.  Past Medical History:  Diagnosis Date  . Allergic rhinitis   . Anxiety and depression   . Attention deficit disorder (ADD) in adult   . Atypical chest pain    Myocardial perf imaging 04/04/16: Low risk stress nuclear study with prior inferior infarct and mild peri-infarct ischemia; EF 55 with mild hypokinesis of the inferior wall.  Cath normal, so stress test was FALSE POS.  Marland Kitchen Cholelithiasis 03/2016   u/s: no signs of cholecystitis  . Fibromyalgia syndrome   . GERD (gastroesophageal reflux disease)   . History of breast cancer 2012   left mastectomy and chemotherapy  . History of chemotherapy    2012 completed  . Hyperlipidemia   . Hypertension   . Moderate persistent asthma    dx'd age 47 (Dr. Gwenette Greet) hx of noncompliance with controller therapy due to cost. PFTs 01/2019->mild small airways obst dz, no resp to bronchodil. Trelegy started 02/2019 by Dr. Ander Slade. Breztri helpful as of 2021.  . Morbid obesity ([place])   . NASH (nonalcoholic steatohepatitis) 02/2010   u/s confirmed 02/2010 (hx of transaminitis).  Repeat u/s 03/2016 showed no sign of cirrhosis or liver mass, just moderate hepatic  steatosis. AST/ALT stable 06/2019  . Neuropathy of both upper extremities    and both feet  . OSA (obstructive sleep apnea)    Dr Gwenette Greet initially: pt couldn't tolerate CPAP.  Re-eval 12/2017 re-confirmed severe OSA, CPAP at 11 recommended.  . Ovarian mass    resected 07/12/10  . RLS (restless legs syndrome)   . Type II or unspecified type diabetes mellitus without mention of complication, uncontrolled 05/15/2012    Past Surgical History:  Procedure Laterality Date  . BILATERAL SALPINGOOPHORECTOMY  07/12/2010   enlarged ovary  . CARDIOVASCULAR STRESS TEST  04/04/2016   Low risk stress nuclear study with prior inferior infarct and mild peri-infarct ischemia; EF 55% with mild hypokinesis of the inferior wall.  F/u cath was NORMAL, so this is suspected to be a false pos stress test (diaphragmatic attenuation)  . G 2 P 2    . LEFT HEART CATH AND CORONARY ANGIOGRAPHY N/A 04/20/2016   Normal coronaries.  EF 55-65%, normal LV function. (Stress test prior to this was false pos due to diaphragmatic attenuation). Procedure: Left Heart Cath and Coronary Angiography;  Surgeon: Peter M Martinique, MD;  Location: [facility];  Service: Cardiovascular;  Laterality: N/A;  . MASTECTOMY  06/21/2010   Left ; Dr [facility]    Family History  Adopted: Yes  Problem Relation Age of Onset  . Healthy Son        65  .  Healthy Daughter        9    Social History   Socioeconomic History  . Marital status: Married    Spouse name: Not on file  . Number of children: 2  . Years of education: Not on file  . Highest education level: Not on file  Occupational History  . Occupation: Curator: artist j  Tobacco Use  . Smoking status: Former Smoker    Packs/day: 0.50    Years: 24.00    Pack years: 12.00    Types: Cigarettes    Quit date: 08/16/2018    Years since quitting: 1.9  . Smokeless tobacco: Never Used  Vaping Use  . Vaping Use: Never used  Substance and Sexual Activity  .  Alcohol use: No  . Drug use: No  . Sexual activity: Not on file  Other Topics Concern  . Not on file  Social History Narrative   Not working as of 09/2017.  Previously worked as a Art therapist.   Lives with husband and two children.   2-3 caffeine drinks daily       Social Determinants of Health   Financial Resource Strain: Not on file  Food Insecurity: Not on file  Transportation Needs: Not on file  Physical Activity: Not on file  Stress: Not on file  Social Connections: Not on file  Intimate Partner Violence: Not on file    Outpatient Medications Prior to Visit  Medication Sig Dispense Refill  . albuterol (PROVENTIL) (2.5 MG/3ML) 0.083% nebulizer solution Take 3 mLs (2.5 mg total) by nebulization every 6 (six) hours as needed for wheezing or shortness of breath. 75 mL 5  . albuterol (VENTOLIN HFA) 108 (90 Base) MCG/ACT inhaler INHALE ONE PUFF BY MOUTH EVERY 6 HOURS AS NEEDED FOR FOR SHORTNESS OF BREATH OR WHEEZING 18 g 0  . amphetamine-dextroamphetamine (ADDERALL) 20 MG tablet Take 1 tablet (20 mg total) by mouth 2 (two) times daily. 60 tablet 0  . atorvastatin (LIPITOR) 80 MG tablet Take 1 tablet (80 mg total) by mouth daily. 30 tablet 5  . CALCIUM-VITAMIN D PO Take by mouth.    . cetirizine (ZYRTEC) 10 MG tablet Take 10 mg by mouth daily.    . citalopram (CELEXA) 40 MG tablet TAKE ONE TABLET BY MOUTH DAILY 90 tablet 0  . clonazePAM (KLONOPIN) 0.5 MG tablet TAKE ONE TABLET BY MOUTH TWICE A DAY AS NEEDED 60 tablet 5  . fluorouracil (EFUDEX) 5 % cream Apply topically 2 (two) times daily. 40 g 2  . ibuprofen (ADVIL,MOTRIN) 200 MG tablet Take 800 mg by mouth as needed for fever or moderate pain.     . Insulin Pen Needle (BD PEN NEEDLE NANO U/F) 32G X 4 MM MISC Use with Victoza as directed once daily 100 each 1  . MAGNESIUM OXIDE PO Take 800 mg by mouth daily.    . metFORMIN (GLUCOPHAGE) 1000 MG tablet Take 1 tablet (1,000 mg total) by mouth 2 (two) times daily with a meal. 60  tablet 6  . Multiple Vitamin (MULTIVITAMIN) tablet Take 1 tablet by mouth daily.    . Omega-3 Fatty Acids (FISH OIL) 1200 MG CPDR Take by mouth.    . pregabalin (LYRICA) 150 MG capsule Take 1 capsule (150 mg total) by mouth 2 (two) times daily. 60 capsule 0  . rOPINIRole (REQUIP) 0.5 MG tablet TAKE ONE TABLET BY MOUTH DAILY 90 tablet 3  . triamterene-hydrochlorothiazide (MAXZIDE-25) 37.5-25 MG tablet TAKE 1/2 TABLET  BY MOUTH DAILY 45 tablet 0  . verapamil (CALAN) 120 MG tablet TAKE ONE TABLET BY MOUTH TWICE A DAY (Patient taking differently: Take 60 mg by mouth daily in the afternoon.) 60 tablet 1  . atorvastatin (LIPITOR) 40 MG tablet TAKE ONE TABLET BY MOUTH DAILY 90 tablet 0  . budesonide-formoterol (SYMBICORT) 160-4.5 MCG/ACT inhaler Inhale 2 puffs into the lungs 2 (two) times daily. 1 each 6  . Docusate Calcium (STOOL SOFTENER PO) Take by mouth.     No facility-administered medications prior to visit.    Allergies  Allergen Reactions  . Metoprolol Other (See Comments)    REACTION: " chest pain"  . Sulfa Antibiotics Nausea And Vomiting    Review of Systems Per hpi      Objective:    Physical Exam Vitals and nursing note reviewed.  Constitutional:      General: She is not in acute distress.    Appearance: Normal appearance. She is not ill-appearing, toxic-appearing or diaphoretic.  Cardiovascular:     Rate and Rhythm: Normal rate and regular rhythm.     Pulses: Normal pulses.     Heart sounds: Normal heart sounds. No murmur heard. No friction rub. No gallop.   Pulmonary:     Effort: Pulmonary effort is normal. No respiratory distress.     Breath sounds: Normal breath sounds. No stridor. No wheezing, rhonchi or rales.  Chest:     Chest wall: No tenderness.  Skin:    General: Skin is warm and dry.     Capillary Refill: Capillary refill takes less than 2 seconds.  Neurological:     General: No focal deficit present.     Mental Status: She is alert and oriented to  person, place, and time. Mental status is at baseline.  Psychiatric:        Mood and Affect: Mood normal.        Behavior: Behavior normal.        Thought Content: Thought content normal.        Judgment: Judgment normal.     BP (!) 146/90 (BP Location: Right Arm, Patient Position: Sitting, Cuff Size: Large)   Pulse (!) 105   Ht _0  (1.651 m)   Wt 262 lb (118.8 kg)   LMP 07/14/2010 Comment: ovaries removed 2012  SpO2 96%   BMI 43.60 kg/m  Wt Readings from Last 3 Encounters:  08/04/20 262 lb (118.8 kg)  06/08/20 267 lb 3.2 oz (121.2 kg)  03/11/20 267 lb (121.1 kg)    Health Maintenance Due  Topic Date Due  . COLONOSCOPY (Pts 45-45yr Insurance coverage will need to be confirmed)  Never done  . PAP SMEAR-Modifier  09/17/2018  . OPHTHALMOLOGY EXAM  11/16/2018  . COVID-19 Vaccine (3 - Pfizer risk 4-dose series) 08/12/2019    There are no preventive care reminders to display for this patient.   Lab Results  Component Value Date   TSH 2.37 12/09/2018   Lab Results  Component Value Date   WBC 9.8 08/01/2019   HGB 13.6 08/01/2019   HCT 40.3 08/01/2019   MCV 91.9 08/01/2019   PLT 273.0 08/01/2019   Lab Results  Component Value Date   NA 139 03/11/2020   K 3.9 03/11/2020   CHLORIDE 103 01/15/2015   CO2 29 03/11/2020   GLUCOSE 125 (H) 03/11/2020   BUN 12 03/11/2020   CREATININE 0.60 03/11/2020   BILITOT 0.7 03/11/2020   ALKPHOS 43 03/11/2020   AST 44 (H) 03/11/2020  ALT 98 (H) 03/11/2020   PROT 7.5 03/11/2020   ALBUMIN 4.7 03/11/2020   CALCIUM 10.1 03/11/2020   ANIONGAP 13 11/07/2018   EGFR 88 (L) 01/15/2015   GFR 102.34 03/11/2020   Lab Results  Component Value Date   CHOL 197 03/11/2020   Lab Results  Component Value Date   HDL 37.70 (L) 03/11/2020   Lab Results  Component Value Date   LDLCALC 47 12/09/2018   Lab Results  Component Value Date   TRIG 248.0 (H) 03/11/2020   Lab Results  Component Value Date   CHOLHDL 5 03/11/2020   Lab  Results  Component Value Date   HGBA1C 6.4 03/11/2020       Assessment & Plan:   Problem List Items Addressed This Visit   None   Visit Diagnoses    Lower respiratory infection    -  Primary   Relevant Medications   azithromycin (ZITHROMAX) 250 MG tablet   predniSONE (STERAPRED UNI-PAK 21 TAB) 10 MG (21) TBPK tablet   Other Relevant Orders   DG Chest 2 View       No orders of the defined types were placed in this encounter.  PLAN  z pack and prednisone for suspected pna  Per pt request will defer xray due to [place] with this plan  Exam reassuring, no obvious evidence of mass or fluid in lung, no atelectasis or wheezing.  Patient encouraged to call clinic with any questions, comments, or concerns.  Maximiano Coss, NP

## 2020-08-04 NOTE — Patient Instructions (Signed)
Ms. [name] to meet you. Let's start with the z pack and prednisone taper for what I suspect to be a developing lower respiratory infection.   The Xray is okay to delay until you return from [place] - enjoy your trip!  If you cough up any blood, notice lightheadedness not relieved by your inhaler, or symptoms persist or worsen, we can pursue further steps like adding an inhaler, doing a CT of the chest, or getting back in to see pulmonology.  I would anticipate you'll be feeling better within 2-3 days but please finish out the full course of each medication  Thank you  Denice Paradise

## 2020-08-19 ENCOUNTER — Telehealth: Payer: Self-pay

## 2020-08-19 NOTE — Telephone Encounter (Signed)
Patient refill request.  Patient stated that pharmacy sent request over earlier in week and still has not had a response from our office.  albuterol (VENTOLIN HFA) 108 (90 Base) MCG/ACT inhaler [[id number]]    [facility] [address] [place] -

## 2020-08-20 ENCOUNTER — Other Ambulatory Visit: Payer: Self-pay

## 2020-08-20 MED ORDER — ALBUTEROL SULFATE HFA 108 (90 BASE) MCG/ACT IN AERS: INHALATION_SPRAY | RESPIRATORY_TRACT | 0 refills | Status: DC

## 2020-08-20 NOTE — Telephone Encounter (Signed)
Pt advised refill sent but no electronic request received from the pharmacy.

## 2020-08-27 ENCOUNTER — Other Ambulatory Visit: Payer: Self-pay | Admitting: Family Medicine

## 2020-08-27 NOTE — Telephone Encounter (Signed)
RF request for Lyrica LOV: 06/08/20 Next ov: 09/09/20 Last written: 06/08/20(60,0)  Please Advise. Medication pending

## 2020-09-02 ENCOUNTER — Ambulatory Visit: Payer: Managed Care, Other (non HMO) | Admitting: Sports Medicine

## 2020-09-07 ENCOUNTER — Other Ambulatory Visit: Payer: Self-pay

## 2020-09-09 ENCOUNTER — Ambulatory Visit: Payer: Self-pay | Admitting: Family Medicine

## 2020-09-09 DIAGNOSIS — Z0289 Encounter for other administrative examinations: Secondary | ICD-10-CM

## 2020-09-09 NOTE — Progress Notes (Deleted)
OFFICE VISIT  09/09/2020  CC: No chief complaint on file.   HPI:    Patient is a 55 y.o. Caucasian female who presents for 3 mo f/u DM 2, HTN, HLD, NASH, and adult ADD with high risk med use. A/P as of last visit: "1) DM: tolerating metformin 1000 mg bid. +DPN bilat. No insurance---pt asks to postpone labs so we'll do a1c at next f/u in 45mo   2) HTN: stable on verapamil 1278mbid and 1/2 maxzide 37.5/25 tab qd. Cr and lytes have consistently been normal, most recently 3 mo ago. Ok to defer recheck of these labs until next f/u in 72m76mo 3) HLD: tolerating atorva 26m49m. Hx of chronic mild LFT elevations d/t NAFLD. Recheck lipids and hepatic panel at next f/u 72mo.62mo) Adult ADD: cost issues prevail currently.  We'll see if generic adderall IR 20mg,272mid is affordable---eRx'd #60 today.   5) GAD: stable on citalopram 40mg q99md clonaz 0.5mg qhs45m 6) Fibromyalgia: she is stable on lyrica 150mg bid74md list above lists gabapentin but she is actually taking lyrica 150mg bid,14m gabapentin).  Med list updated but no new rx was sent in for lyrica today.   7) Health maintenance exam: Reviewed age and gender appropriate health maintenance issues (prudent diet, regular exercise, health risks of tobacco and excessive alcohol, use of seatbelts, fire alarms in home, use of sunscreen).  Also reviewed age and gender appropriate health screening as well as vaccine recommendations. Vaccines: ALL UTD. Labs: pt deferred labs today d/t lack of insurance coverage/cost---we'll get some labs in 3 mo. Cervical ca screening: Her last pap was 09/2015 and was normal.  As per latest guidelines her next PAP is due 09/2020. Breast ca screening:  left mastectomy for breast ca 06/2010:->normal screening mammogram of R breast 04/2019, needs repeat. Colon ca screening: she is due for colon ca screening, options discussed today->not insured currently so she defers for now."  INTERIM HX: [facility] and was rx'd azith and prednisone. Hba1c was obtained and it was 6.4%.  Hepatic panel was obtained d/t her hx of NASH and all was normal except ALT was 78 (stable).     Past Medical History:  Diagnosis Date   Allergic rhinitis    Anxiety and depression    Attention deficit disorder (ADD) in adult    Atypical chest pain    Myocardial perf imaging 04/04/16: Low risk stress nuclear study with prior inferior infarct and mild peri-infarct ischemia; EF 55 with mild hypokinesis of the inferior wall.  Cath normal, so stress test was FALSE POS.   Cholelithiasis 03/2016   u/s: no signs of cholecystitis   Fibromyalgia syndrome    GERD (gastroesophageal reflux disease)    History of breast cancer 2012   left mastectomy and chemotherapy   History of chemotherapy    2012 completed   Hyperlipidemia    Hypertension    Moderate persistent asthma    dx'd age 24 (Dr. Cl63ce) hxGwenette Greetncompliance with controller therapy due to cost. PFTs 01/2019->mild small airways obst dz, no resp to bronchodil. Trelegy started 02/2019 by Dr. Olalere. BAnder Sladehelpful as of 2021.   Morbid obesity ([facility])    [place](nonalcoholic steatohepatitis) 02/2010   u/s confirmed 02/2010 (hx of transaminitis).  Repeat u/s 03/2016 showed no sign of cirrhosis or liver mass, just moderate hepatic steatosis. AST/ALT stable 06/2019  Neuropathy of both upper extremities    and both feet   OSA (obstructive sleep apnea)    Dr Gwenette Greet initially: pt couldn't tolerate CPAP.  Re-eval 12/2017 re-confirmed severe OSA, CPAP at 11 recommended.   Ovarian mass    resected 07/12/10   RLS (restless legs syndrome)    Type II or unspecified type diabetes mellitus without mention of complication, uncontrolled 05/15/2012    Past Surgical History:  Procedure Laterality Date   BILATERAL SALPINGOOPHORECTOMY  07/12/2010   enlarged ovary   CARDIOVASCULAR STRESS TEST  04/04/2016   Low risk stress nuclear  study with prior inferior infarct and mild peri-infarct ischemia; EF 55% with mild hypokinesis of the inferior wall.  F/u cath was NORMAL, so this is suspected to be a false pos stress test (diaphragmatic attenuation)   G 2 P 2     LEFT HEART CATH AND CORONARY ANGIOGRAPHY N/A 04/20/2016   Normal coronaries.  EF 55-65%, normal LV function. (Stress test prior to this was false pos due to diaphragmatic attenuation). Procedure: Left Heart Cath and Coronary Angiography;  Surgeon: Peter M Martinique, MD;  Location: [facility];  Service: Cardiovascular;  Laterality: N/A;   MASTECTOMY  06/21/2010   Left ; Dr [facility]    Outpatient Medications Prior to Visit  Medication Sig Dispense Refill   albuterol (PROVENTIL) (2.5 MG/3ML) 0.083% nebulizer solution Take 3 mLs (2.5 mg total) by nebulization every 6 (six) hours as needed for wheezing or shortness of breath. 75 mL 5   albuterol (VENTOLIN HFA) 108 (90 Base) MCG/ACT inhaler INHALE ONE PUFF BY MOUTH EVERY 6 HOURS AS NEEDED FOR FOR SHORTNESS OF BREATH OR WHEEZING 18 g 0   amphetamine-dextroamphetamine (ADDERALL) 20 MG tablet Take 1 tablet (20 mg total) by mouth 2 (two) times daily. 60 tablet 0   atorvastatin (LIPITOR) 80 MG tablet Take 1 tablet (80 mg total) by mouth daily. 30 tablet 5   CALCIUM-VITAMIN D PO Take by mouth.     cetirizine (ZYRTEC) 10 MG tablet Take 10 mg by mouth daily.     citalopram (CELEXA) 40 MG tablet TAKE ONE TABLET BY MOUTH DAILY 90 tablet 0   clonazePAM (KLONOPIN) 0.5 MG tablet TAKE ONE TABLET BY MOUTH TWICE A DAY AS NEEDED 60 tablet 5   fluorouracil (EFUDEX) 5 % cream Apply topically 2 (two) times daily. 40 g 2   ibuprofen (ADVIL,MOTRIN) 200 MG tablet Take 800 mg by mouth as needed for fever or moderate pain.      Insulin Pen Needle (BD PEN NEEDLE NANO U/F) 32G X 4 MM MISC Use with Victoza as directed once daily 100 each 1   MAGNESIUM OXIDE PO Take 800 mg by mouth daily.     metFORMIN (GLUCOPHAGE) 1000 MG tablet Take 1  tablet (1,000 mg total) by mouth 2 (two) times daily with a meal. 60 tablet 6   Multiple Vitamin (MULTIVITAMIN) tablet Take 1 tablet by mouth daily.     Omega-3 Fatty Acids (FISH OIL) 1200 MG CPDR Take by mouth.     predniSONE (STERAPRED UNI-PAK 21 TAB) 10 MG (21) TBPK tablet Take per package instructions. Do not skip doses. Finish entire supply. 1 each 0   pregabalin (LYRICA) 150 MG capsule TAKE ONE CAPSULE BY MOUTH TWICE A DAY 60 capsule 5   rOPINIRole (REQUIP) 0.5 MG tablet TAKE ONE TABLET BY MOUTH DAILY 90 tablet 3   triamterene-hydrochlorothiazide (MAXZIDE-25) 37.5-25 MG tablet TAKE 1/2 TABLET BY MOUTH DAILY 45 tablet 0  verapamil (CALAN) 120 MG tablet TAKE ONE TABLET BY MOUTH TWICE A DAY (Patient taking differently: Take 60 mg by mouth daily in the afternoon.) 60 tablet 1   No facility-administered medications prior to visit.    Allergies  Allergen Reactions   Metoprolol Other (See Comments)    REACTION: " chest pain"   Sulfa Antibiotics Nausea And Vomiting    ROS As per HPI  PE: Vitals with BMI 08/04/2020 06/08/2020 03/11/2020  Height 5' 5"  5' 5"  5' 4"   Weight 262 lbs 267 lbs 3 oz 267 lbs  BMI 43.6 60.15 61.53  Systolic 794 327 614  Diastolic 90 76 69  Pulse 709 87 81     ***  LABS:  Lab Results  Component Value Date   TSH 2.37 12/09/2018   Lab Results  Component Value Date   WBC 9.8 08/01/2019   HGB 13.6 08/01/2019   HCT 40.3 08/01/2019   MCV 91.9 08/01/2019   PLT 273.0 08/01/2019   Lab Results  Component Value Date   CREATININE 0.60 03/11/2020   BUN 12 03/11/2020   NA 139 03/11/2020   K 3.9 03/11/2020   CL 103 03/11/2020   CO2 29 03/11/2020   Lab Results  Component Value Date   ALT 78 (H) 08/04/2020   AST 35 08/04/2020   ALKPHOS 43 08/04/2020   BILITOT 0.5 08/04/2020   Lab Results  Component Value Date   CHOL 197 03/11/2020   Lab Results  Component Value Date   HDL 37.70 (L) 03/11/2020   Lab Results  Component Value Date   LDLCALC 47  12/09/2018   Lab Results  Component Value Date   TRIG 248.0 (H) 03/11/2020   Lab Results  Component Value Date   CHOLHDL 5 03/11/2020   Lab Results  Component Value Date   HGBA1C 6.4 08/04/2020    IMPRESSION AND PLAN:  No problem-specific Assessment & Plan notes found for this encounter.   An After Visit Summary was printed and given to the patient.  FOLLOW UP: No follow-ups on file.  Signed:  Crissie Sickles, MD           09/09/2020

## 2020-09-10 ENCOUNTER — Other Ambulatory Visit: Payer: Self-pay

## 2020-09-10 MED ORDER — ALBUTEROL SULFATE HFA 108 (90 BASE) MCG/ACT IN AERS: INHALATION_SPRAY | RESPIRATORY_TRACT | 0 refills | Status: DC

## 2020-09-27 ENCOUNTER — Other Ambulatory Visit: Payer: Self-pay | Admitting: Family Medicine

## 2020-09-30 ENCOUNTER — Telehealth: Payer: Self-pay | Admitting: Family Medicine

## 2020-09-30 NOTE — Telephone Encounter (Signed)
Chart reviewed. Pt has been seen long term by [facility] medicine providers in the [facility].  I recommend she continue care through them.

## 2020-09-30 NOTE — Telephone Encounter (Signed)
Disregard

## 2020-09-30 NOTE — Telephone Encounter (Signed)
Patient requesting 3 month refills of Adderall. Please send to same [name] on [address].

## 2020-09-30 NOTE — Telephone Encounter (Signed)
Ms. Uher is requesting Dr. [name] accept her mother, [place] MRN [id number] as a patient. Please advise if okay to schedule.

## 2020-09-30 NOTE — Telephone Encounter (Signed)
[name] states they are not happy with mothers current physicians with [facility]. They will be transferring to a new doctor and was hoping that would be here. Please advise.

## 2020-09-30 NOTE — Telephone Encounter (Signed)
Please advise if this okay?

## 2020-10-01 ENCOUNTER — Encounter: Payer: Self-pay | Admitting: Family Medicine

## 2020-10-01 MED ORDER — AMPHETAMINE-DEXTROAMPHETAMINE 20 MG PO TABS: 20.0000 mg | ORAL_TABLET | Freq: Two times a day (BID) | ORAL | 0 refills | Status: DC

## 2020-10-01 NOTE — Telephone Encounter (Signed)
Adderall eRx'd

## 2020-10-01 NOTE — Telephone Encounter (Signed)
It appears pt is a resident of a [facility]. I do not manage nursing home residents. Sorry.

## 2020-10-01 NOTE — Telephone Encounter (Signed)
Spoke with pt daughter who stated that she would like to know if Dr. Raoul Pitch will see as a new pt. She is already aware of current wait time for appt

## 2020-10-01 NOTE — Telephone Encounter (Signed)
Please inform pt that we can not accept her as a NP at this time and continue with same recommendations given earlier. Thanks

## 2020-10-01 NOTE — Telephone Encounter (Signed)
Pt sched for appt due to NS appt on 09/09/20  Requesting: Adderall Contract:03/11/20 UDS: 09/06/18 Last Visit: 06/08/20 Next Visit: 10/11/20  Last Refill:06/08/20(60,0)   Please Advise.

## 2020-10-11 ENCOUNTER — Ambulatory Visit: Payer: Self-pay | Admitting: Family Medicine

## 2020-10-11 NOTE — Progress Notes (Deleted)
OFFICE VISIT  10/11/2020  CC: No chief complaint on file.   HPI:    Patient is a 55 y.o. Caucasian female who presents for 4 mo f/u DM, HTN, adult ADD, HLD, fibromyalgia. A/P as of last visit: "1) DM: tolerating metformin 1000 mg bid. +DPN bilat. No insurance---pt asks to postpone labs so we'll do a1c at next f/u in 71mo   2) HTN: stable on verapamil 1261mbid and 1/2 maxzide 37.5/25 tab qd. Cr and lytes have consistently been normal, most recently 3 mo ago. Ok to defer recheck of these labs until next f/u in 38m15mo 3) HLD: tolerating atorva 54m57m. Hx of chronic mild LFT elevations d/t NAFLD. Recheck lipids and hepatic panel at next f/u 38mo.20mo) Adult ADD: cost issues prevail currently.  We'll see if generic adderall IR 20mg,12mid is affordable---eRx'd #60 today.   5) GAD: stable on citalopram 40mg q55md clonaz 0.5mg qhs16m 6) Fibromyalgia: she is stable on lyrica 150mg bid2md list above lists gabapentin but she is actually taking lyrica 150mg bid,24m gabapentin).  Med list updated but no new rx was sent in for lyrica today.   7) Health maintenance exam: Reviewed age and gender appropriate health maintenance issues (prudent diet, regular exercise, health risks of tobacco and excessive alcohol, use of seatbelts, fire alarms in home, use of sunscreen).  Also reviewed age and gender appropriate health screening as well as vaccine recommendations. Vaccines: ALL UTD. Labs: pt deferred labs today d/t lack of insurance coverage/cost---we'll get some labs in 3 mo. Cervical ca screening: Her last pap was 09/2015 and was normal.  As per latest guidelines her next PAP is due 09/2020. Breast ca screening:  left mastectomy for breast ca 06/2010:->normal screening mammogram of R breast 04/2019, needs repeat. Colon ca screening: she is due for colon ca screening, options discussed today->not insured currently so she defers for now."  INTERIM HX: ***  DM 2: [facility]%.    PMP AWARE reviewed today: most recent rx for *** was filled ***, # ***, rx by ***. PMP AWARE reviewed today: most recent rx for *** was filled ***, # ***, rx by ***. PMP AWARE reviewed today: most recent rx for *** was filled ***, # ***, rx by ***. No red flags.  Past Medical History:  Diagnosis Date   Allergic rhinitis    Anxiety and depression    Attention deficit disorder (ADD) in adult    Atypical chest pain    Myocardial perf imaging 04/04/16: Low risk stress nuclear study with prior inferior infarct and mild peri-infarct ischemia; EF 55 with mild hypokinesis of the inferior wall.  Cath normal, so stress test was FALSE POS.   Cholelithiasis 03/2016   u/s: no signs of cholecystitis   Fibromyalgia syndrome    GERD (gastroesophageal reflux disease)    History of breast cancer 2012   left mastectomy and chemotherapy   History of chemotherapy    2012 completed   Hyperlipidemia    Hypertension    Moderate persistent asthma    dx'd age 38 (Dr. Cl64ce) hxGwenette Greetncompliance with controller therapy due to cost. PFTs 01/2019->mild small airways obst dz, no resp to bronchodil. Trelegy started 02/2019 by Dr. Olalere. BAnder Sladehelpful as of 2021.   Morbid obesity (HCC)    [place](nonalcoholic steatohepatitis) 02/2010   u/s confirmed 02/2010 (hx of transaminitis).  Repeat u/s 03/2016 showed no sign of cirrhosis or liver mass, just moderate hepatic steatosis. AST/ALT stable 06/2019   Neuropathy of both upper extremities    and both feet   OSA (obstructive sleep apnea)    Dr Gwenette Greet initially: pt couldn't tolerate CPAP.  Re-eval 12/2017 re-confirmed severe OSA, CPAP at 11 recommended.   Ovarian mass    resected 07/12/10   RLS (restless legs syndrome)    Type II or unspecified type diabetes mellitus without mention of complication, uncontrolled 05/15/2012    Past Surgical History:  Procedure Laterality Date   BILATERAL  SALPINGOOPHORECTOMY  07/12/2010   enlarged ovary   CARDIOVASCULAR STRESS TEST  04/04/2016   Low risk stress nuclear study with prior inferior infarct and mild peri-infarct ischemia; EF 55% with mild hypokinesis of the inferior wall.  F/u cath was NORMAL, so this is suspected to be a false pos stress test (diaphragmatic attenuation)   G 2 P 2     LEFT HEART CATH AND CORONARY ANGIOGRAPHY N/A 04/20/2016   Normal coronaries.  EF 55-65%, normal LV function. (Stress test prior to this was false pos due to diaphragmatic attenuation). Procedure: Left Heart Cath and Coronary Angiography;  Surgeon: Peter M Martinique, MD;  Location: [place] [facility];  Service: Cardiovascular;  Laterality: N/A;   MASTECTOMY  06/21/2010   Left ; Dr [facility]    Outpatient Medications Prior to Visit  Medication Sig Dispense Refill   albuterol (PROVENTIL) (2.5 MG/3ML) 0.083% nebulizer solution Take 3 mLs (2.5 mg total) by nebulization every 6 (six) hours as needed for wheezing or shortness of breath. 75 mL 5   albuterol (VENTOLIN HFA) 108 (90 Base) MCG/ACT inhaler INHALE ONE PUFF BY MOUTH EVERY 6 HOURS AS NEEDED FOR FOR SHORTNESS OF BREATH OR WHEEZING 6.7 g 0   amphetamine-dextroamphetamine (ADDERALL) 20 MG tablet Take 1 tablet (20 mg total) by mouth 2 (two) times daily. 60 tablet 0   atorvastatin (LIPITOR) 80 MG tablet Take 1 tablet (80 mg total) by mouth daily. 30 tablet 5   CALCIUM-VITAMIN D PO Take by mouth.     cetirizine (ZYRTEC) 10 MG tablet Take 10 mg by mouth daily.     citalopram (CELEXA) 40 MG tablet TAKE ONE TABLET BY MOUTH DAILY 90 tablet 0   clonazePAM (KLONOPIN) 0.5 MG tablet TAKE ONE TABLET BY MOUTH TWICE A DAY AS NEEDED 60 tablet 5   fluorouracil (EFUDEX) 5 % cream Apply topically 2 (two) times daily. 40 g 2   ibuprofen (ADVIL,MOTRIN) 200 MG tablet Take 800 mg by mouth as needed for fever or moderate pain.      Insulin Pen Needle (BD PEN NEEDLE NANO U/F) 32G X 4 MM MISC Use with Victoza as directed once  daily 100 each 1   MAGNESIUM OXIDE PO Take 800 mg by mouth daily.     metFORMIN (GLUCOPHAGE) 1000 MG tablet Take 1 tablet (1,000 mg total) by mouth 2 (two) times daily with a meal. 60 tablet 6   Multiple Vitamin (MULTIVITAMIN) tablet Take 1 tablet by mouth daily.     Omega-3 Fatty Acids (FISH OIL) 1200 MG CPDR Take by mouth.     predniSONE (STERAPRED UNI-PAK 21 TAB) 10 MG (21) TBPK tablet Take per package instructions. Do not skip doses. Finish entire supply. 1 each 0   pregabalin (LYRICA) 150 MG capsule TAKE ONE CAPSULE BY MOUTH TWICE A DAY 60 capsule 5   rOPINIRole (REQUIP) 0.5 MG tablet TAKE ONE TABLET BY MOUTH DAILY  90 tablet 3   triamterene-hydrochlorothiazide (MAXZIDE-25) 37.5-25 MG tablet TAKE 1/2 TABLET BY MOUTH DAILY 45 tablet 0   verapamil (CALAN) 120 MG tablet TAKE ONE TABLET BY MOUTH TWICE A DAY (Patient taking differently: Take 60 mg by mouth daily in the afternoon.) 60 tablet 1   No facility-administered medications prior to visit.    Allergies  Allergen Reactions   Metoprolol Other (See Comments)    REACTION: " chest pain"   Sulfa Antibiotics Nausea And Vomiting    ROS As per HPI  PE: Vitals with BMI 08/04/2020 06/08/2020 03/11/2020  Height 5' 5"  5' 5"  5' 4"   Weight 262 lbs 267 lbs 3 oz 267 lbs  BMI 43.6 20.80 22.33  Systolic 612 244 975  Diastolic 90 76 69  Pulse 300 87 81     ***  LABS:  Lab Results  Component Value Date   TSH 2.37 12/09/2018   Lab Results  Component Value Date   WBC 9.8 08/01/2019   HGB 13.6 08/01/2019   HCT 40.3 08/01/2019   MCV 91.9 08/01/2019   PLT 273.0 08/01/2019   Lab Results  Component Value Date   CREATININE 0.60 03/11/2020   BUN 12 03/11/2020   NA 139 03/11/2020   K 3.9 03/11/2020   CL 103 03/11/2020   CO2 29 03/11/2020   Lab Results  Component Value Date   ALT 78 (H) 08/04/2020   AST 35 08/04/2020   ALKPHOS 43 08/04/2020   BILITOT 0.5 08/04/2020   Lab Results  Component Value Date   CHOL 197 03/11/2020    Lab Results  Component Value Date   HDL 37.70 (L) 03/11/2020   Lab Results  Component Value Date   LDLCALC 47 12/09/2018   Lab Results  Component Value Date   TRIG 248.0 (H) 03/11/2020   Lab Results  Component Value Date   CHOLHDL 5 03/11/2020   Lab Results  Component Value Date   HGBA1C 6.4 08/04/2020    IMPRESSION AND PLAN:  No problem-specific Assessment & Plan notes found for this encounter.   An After Visit Summary was printed and given to the patient.  FOLLOW UP: No follow-ups on file. Next a1c after 11/04/20  Signed:  Crissie Sickles, MD           10/11/2020

## 2020-10-18 ENCOUNTER — Ambulatory Visit (INDEPENDENT_AMBULATORY_CARE_PROVIDER_SITE_OTHER): Payer: Self-pay | Admitting: Family Medicine

## 2020-10-18 ENCOUNTER — Other Ambulatory Visit: Payer: Self-pay

## 2020-10-18 ENCOUNTER — Encounter: Payer: Self-pay | Admitting: Family Medicine

## 2020-10-18 VITALS — BP 115/79 | HR 95 | Temp 97.9°F | Resp 16 | Ht 65.0 in | Wt 256.6 lb

## 2020-10-18 DIAGNOSIS — E7800 Pure hypercholesterolemia, unspecified: Secondary | ICD-10-CM

## 2020-10-18 DIAGNOSIS — Z79899 Other long term (current) drug therapy: Secondary | ICD-10-CM

## 2020-10-18 DIAGNOSIS — Z1211 Encounter for screening for malignant neoplasm of colon: Secondary | ICD-10-CM

## 2020-10-18 DIAGNOSIS — I10 Essential (primary) hypertension: Secondary | ICD-10-CM

## 2020-10-18 DIAGNOSIS — M797 Fibromyalgia: Secondary | ICD-10-CM

## 2020-10-18 DIAGNOSIS — E119 Type 2 diabetes mellitus without complications: Secondary | ICD-10-CM

## 2020-10-18 DIAGNOSIS — F988 Other specified behavioral and emotional disorders with onset usually occurring in childhood and adolescence: Secondary | ICD-10-CM

## 2020-10-18 DIAGNOSIS — R7401 Elevation of levels of liver transaminase levels: Secondary | ICD-10-CM

## 2020-10-18 LAB — LIPID PANEL
Cholesterol: 117 mg/dL (ref 0–200)
HDL: 29.5 mg/dL — ABNORMAL LOW (ref >39.00)
NonHDL: 87.75
Total CHOL/HDL Ratio: 4
Triglycerides: 286 mg/dL — ABNORMAL HIGH (ref 0.0–149.0)
VLDL: 57.2 mg/dL — ABNORMAL HIGH (ref 0.0–40.0)

## 2020-10-18 LAB — COMPREHENSIVE METABOLIC PANEL
ALT: 71 U/L — ABNORMAL HIGH (ref 0–35)
AST: 32 U/L (ref 0–37)
Albumin: 4.1 g/dL (ref 3.5–5.2)
Alkaline Phosphatase: 39 U/L (ref 39–117)
BUN: 13 mg/dL (ref 6–23)
CO2: 26 mEq/L (ref 19–32)
Calcium: 9.7 mg/dL (ref 8.4–10.5)
Chloride: 101 mEq/L (ref 96–112)
Creatinine, Ser: 0.56 mg/dL (ref 0.40–1.20)
GFR: 103.61 mL/min (ref >60.00)
Glucose, Bld: 152 mg/dL — ABNORMAL HIGH (ref 70–99)
Potassium: 3.8 mEq/L (ref 3.5–5.1)
Sodium: 138 mEq/L (ref 135–145)
Total Bilirubin: 0.5 mg/dL (ref 0.2–1.2)
Total Protein: 6.8 g/dL (ref 6.0–8.3)

## 2020-10-18 LAB — LDL CHOLESTEROL, DIRECT: Direct LDL: 57 mg/dL

## 2020-10-18 MED ORDER — ALBUTEROL SULFATE HFA 108 (90 BASE) MCG/ACT IN AERS: INHALATION_SPRAY | RESPIRATORY_TRACT | 0 refills | Status: DC

## 2020-10-18 MED ORDER — FLUOROURACIL 5 % EX CREA: TOPICAL_CREAM | Freq: Two times a day (BID) | CUTANEOUS | 2 refills | Status: DC

## 2020-10-18 NOTE — Progress Notes (Signed)
OFFICE VISIT  10/18/2020  CC:  Chief Complaint  Patient presents with   Follow-up    RCI, pt is fasting   HPI:    Patient is a 55 y.o. Caucasian female who presents for 4 mo f/u DM, HTN, HLD, adult ADD, and GAD. A/P as of last visit: "1) DM: tolerating metformin 1000 mg bid. +DPN bilat. No insurance---pt asks to postpone labs so we'll do a1c at next f/u in 50mo   2) HTN: stable on verapamil 1281mbid and 1/2 maxzide 37.5/25 tab qd. Cr and lytes have consistently been normal, most recently 3 mo ago. Ok to defer recheck of these labs until next f/u in 65m7mo 3) HLD: tolerating atorva 84m66m. Hx of chronic mild LFT elevations d/t NAFLD. Recheck lipids and hepatic panel at next f/u 65mo.39mo) Adult ADD: cost issues prevail currently.  We'll see if generic adderall IR 20mg,84mid is affordable---eRx'd #60 today.   5) GAD: stable on citalopram 40mg q465md clonaz 0.5mg qhs51m 6) Fibromyalgia: she is stable on lyrica 150mg bid45md list above lists gabapentin but she is actually taking lyrica 150mg bid,36m gabapentin).  Med list updated but no new rx was sent in for lyrica today.   7) Health maintenance exam: Reviewed age and gender appropriate health maintenance issues (prudent diet, regular exercise, health risks of tobacco and excessive alcohol, use of seatbelts, fire alarms in home, use of sunscreen).  Also reviewed age and gender appropriate health screening as well as vaccine recommendations. Vaccines: ALL UTD. Labs: pt deferred labs today d/t lack of insurance coverage/cost---we'll get some labs in 3 mo. Cervical ca screening: Her last pap was 09/2015 and was normal.  As per latest guidelines her next PAP is due 09/2020. Breast ca screening:  left mastectomy for breast ca 06/2010:->normal screening mammogram of R breast 04/2019, needs repeat. Colon ca screening: she is due for colon ca screening, options discussed today->not insured currently so she defers for now."  INTERIM  HX: Doing ok, chronically ill mother just moved mother into her home.  Hba1c 6.4% on 08/04/20 when she saw Richard MoMaximiano [facility]oss[facility]iel[facility] Bras illness. No home glucose checks.  Still taking metformin.  Pt states all is going well with the med at current dosing: much improved focus, concentration, task completion.  Less frustration, better multitasking, less impulsivity and restlessness.  Mood is stable. No side effects from the medication. Mood and anxiety levels stable, occ daytime dose of clonaz but takes it every night 2 tabs. Taking lyrica 150 bid, says it helps well most days.  On the days it doesn't help as much she adds 800mg ibup 76mit helps well.  She says her HR and BP were going UP on verapamil and her breathing was more labored.  When she stopped this med 3-4 wks ago her bp and hr normalized and breathing easier!   She doesn't need albuterol much at all anymore! Not on controlled med at this time.  She is eating healthier, portions size lower, burning more calories as well, has lost 20 lbs in last 3 mo or so.  PMP AWARE reviewed today: most recent rx for adderall was filled 10/01/20, # 60, rx by m85 Most recent rx for pregabalin was filled 09/26/20, #60,rx by med. Most recent rx for clonazepam  was filled 09/25/20, #60 rx by me No red flags.   Past Medical History:  Diagnosis Date   Allergic rhinitis  Anxiety and depression    Attention deficit disorder (ADD) in adult    Atypical chest pain    Myocardial perf imaging 04/04/16: Low risk stress nuclear study with prior inferior infarct and mild peri-infarct ischemia; EF 55 with mild hypokinesis of the inferior wall.  Cath normal, so stress test was FALSE POS.   Cholelithiasis 03/2016   u/s: no signs of cholecystitis   Fibromyalgia syndrome    GERD (gastroesophageal reflux disease)    History of breast cancer 2012   left mastectomy and chemotherapy   History of chemotherapy    2012 completed    Hyperlipidemia    Hypertension    Moderate persistent asthma    dx'd age 51 (Dr. Gwenette Greet) hx of noncompliance with controller therapy due to cost. PFTs 01/2019->mild small airways obst dz, no resp to bronchodil. Trelegy started 02/2019 by Dr. Ander Slade. Breztri helpful as of 2021.   Morbid obesity ([place])    NASH (nonalcoholic steatohepatitis) 02/2010   u/s confirmed 02/2010 (hx of transaminitis).  Repeat u/s 03/2016 showed no sign of cirrhosis or liver mass, just moderate hepatic steatosis. AST/ALT stable 06/2019   Neuropathy of both upper extremities    and both feet   OSA (obstructive sleep apnea)    Dr Gwenette Greet initially: pt couldn't tolerate CPAP.  Re-eval 12/2017 re-confirmed severe OSA, CPAP at 11 recommended.   Ovarian mass    resected 07/12/10   RLS (restless legs syndrome)    Type II or unspecified type diabetes mellitus without mention of complication, uncontrolled 05/15/2012    Past Surgical History:  Procedure Laterality Date   BILATERAL SALPINGOOPHORECTOMY  07/12/2010   enlarged ovary   CARDIOVASCULAR STRESS TEST  04/04/2016   Low risk stress nuclear study with prior inferior infarct and mild peri-infarct ischemia; EF 55% with mild hypokinesis of the inferior wall.  F/u cath was NORMAL, so this is suspected to be a false pos stress test (diaphragmatic attenuation)   G 2 P 2     LEFT HEART CATH AND CORONARY ANGIOGRAPHY N/A 04/20/2016   Normal coronaries.  EF 55-65%, normal LV function. (Stress test prior to this was false pos due to diaphragmatic attenuation). Procedure: Left Heart Cath and Coronary Angiography;  Surgeon: Peter M Martinique, MD;  Location: [place] [facility];  Service: Cardiovascular;  Laterality: N/A;   MASTECTOMY  06/21/2010   Left ; Dr [facility]    Outpatient Medications Prior to Visit  Medication Sig Dispense Refill   albuterol (PROVENTIL) (2.5 MG/3ML) 0.083% nebulizer solution Take 3 mLs (2.5 mg total) by nebulization every 6 (six) hours as needed for wheezing  or shortness of breath. 75 mL 5   amphetamine-dextroamphetamine (ADDERALL) 20 MG tablet Take 1 tablet (20 mg total) by mouth 2 (two) times daily. 60 tablet 0   atorvastatin (LIPITOR) 80 MG tablet Take 1 tablet (80 mg total) by mouth daily. 30 tablet 5   CALCIUM-VITAMIN D PO Take by mouth.     cetirizine (ZYRTEC) 10 MG tablet Take 10 mg by mouth daily.     citalopram (CELEXA) 40 MG tablet TAKE ONE TABLET BY MOUTH DAILY 90 tablet 0   clonazePAM (KLONOPIN) 0.5 MG tablet TAKE ONE TABLET BY MOUTH TWICE A DAY AS NEEDED 60 tablet 5   ibuprofen (ADVIL,MOTRIN) 200 MG tablet Take 800 mg by mouth as needed for fever or moderate pain.      Insulin Pen Needle (BD PEN NEEDLE NANO U/F) 32G X 4 MM MISC Use with Victoza as directed once  daily 100 each 1   MAGNESIUM OXIDE PO Take 800 mg by mouth daily.     metFORMIN (GLUCOPHAGE) 1000 MG tablet Take 1 tablet (1,000 mg total) by mouth 2 (two) times daily with a meal. 60 tablet 6   Multiple Vitamin (MULTIVITAMIN) tablet Take 1 tablet by mouth daily.     Omega-3 Fatty Acids (FISH OIL) 1200 MG CPDR Take by mouth.     pregabalin (LYRICA) 150 MG capsule TAKE ONE CAPSULE BY MOUTH TWICE A DAY 60 capsule 5   rOPINIRole (REQUIP) 0.5 MG tablet TAKE ONE TABLET BY MOUTH DAILY 90 tablet 3   triamterene-hydrochlorothiazide (MAXZIDE-25) 37.5-25 MG tablet TAKE 1/2 TABLET BY MOUTH DAILY 45 tablet 0   albuterol (VENTOLIN HFA) 108 (90 Base) MCG/ACT inhaler INHALE ONE PUFF BY MOUTH EVERY 6 HOURS AS NEEDED FOR FOR SHORTNESS OF BREATH OR WHEEZING 6.7 g 0   fluorouracil (EFUDEX) 5 % cream Apply topically 2 (two) times daily. 40 g 2   predniSONE (STERAPRED UNI-PAK 21 TAB) 10 MG (21) TBPK tablet Take per package instructions. Do not skip doses. Finish entire supply. (Patient not taking: Reported on 10/18/2020) 1 each 0   verapamil (CALAN) 120 MG tablet TAKE ONE TABLET BY MOUTH TWICE A DAY (Patient not taking: Reported on 10/18/2020) 60 tablet 1   No facility-administered medications prior to  visit.    Allergies  Allergen Reactions   Metoprolol Other (See Comments)    REACTION: " chest pain"   Sulfa Antibiotics Nausea And Vomiting   Verapamil Hcl Er     Increased HR, blood pressure Makes breathing worse    ROS As per HPI  PE: Vitals with BMI 10/18/2020 08/04/2020 06/08/2020  Height 5' 5"  5' 5"  5' 5"   Weight 256 lbs 10 oz 262 lbs 267 lbs 3 oz  BMI 42.7 43.1 54.00  Systolic 867 619 509  Diastolic 79 90 76  Pulse 95 105 87   Gen: Alert, well appearing.  Patient is oriented to person, place, time, and situation. AFFECT: pleasant, lucid thought and speech. CV: RRR, no m/r/g.   LUNGS: CTA bilat, nonlabored resps, good aeration in all lung fields. EXT: no clubbing or cyanosis.  no edema.    LABS:  Lab Results  Component Value Date   TSH 2.37 12/09/2018   Lab Results  Component Value Date   WBC 9.8 08/01/2019   HGB 13.6 08/01/2019   HCT 40.3 08/01/2019   MCV 91.9 08/01/2019   PLT 273.0 08/01/2019   Lab Results  Component Value Date   CREATININE 0.60 03/11/2020   BUN 12 03/11/2020   NA 139 03/11/2020   K 3.9 03/11/2020   CL 103 03/11/2020   CO2 29 03/11/2020   Lab Results  Component Value Date   ALT 78 (H) 08/04/2020   AST 35 08/04/2020   ALKPHOS 43 08/04/2020   BILITOT 0.5 08/04/2020   Lab Results  Component Value Date   CHOL 197 03/11/2020   Lab Results  Component Value Date   HDL 37.70 (L) 03/11/2020   Lab Results  Component Value Date   LDLCALC 47 12/09/2018   Lab Results  Component Value Date   TRIG 248.0 (H) 03/11/2020   Lab Results  Component Value Date   CHOLHDL 5 03/11/2020   Lab Results  Component Value Date   HGBA1C 6.4 08/04/2020   IMPRESSION AND PLAN:  1) DM 2, good control, diet improving. Cont metformin 1000 mg bid. Hba1c about 2.5 mo ago was 6.4%.  2)  HTN: well controlled on 1/2 of maxzide 35.7-25 tab qd. Stopped verapamil d/t side effects.  3) HLD, mixed. Trigs 248, HDL 37 about 9 mo ago. Tolerating atorva  80 qd. FLP and hepatic panel today.  May need to add fenofibrate.  4) Adult ADD: doing fine on adderall 70m bid. CSC in place. UDS today. No new rx was needed today.  5) Recurrent MDD, GAD: stable/remission. Cont citalopram 444mqd and clonaz 0.5, 1-2 bid prn. CSC UTD. UDS today.  No new rx today.  6) Fibromyalgia: reasonably well controlled with lyrica 150 bid. PRN ibup helpful. CSC UTD. UDS today. No new rx today.  An After Visit Summary was printed and given to the patient.  FOLLOW UP: Return in about 3 months (around 01/18/2021) for routine chronic illness f/u. Next cpe 05/2021  Signed:  PhCrissie SicklesMD           10/18/2020

## 2020-10-22 LAB — DRUG MONITORING PANEL 376104, URINE
Alphahydroxyalprazolam: NEGATIVE ng/mL (ref <25)
Alphahydroxymidazolam: NEGATIVE ng/mL (ref <50)
Alphahydroxytriazolam: NEGATIVE ng/mL (ref <50)
Aminoclonazepam: 223 ng/mL — ABNORMAL HIGH (ref <25)
Amphetamine: 4139 ng/mL — ABNORMAL HIGH (ref <250)
Amphetamines: POSITIVE ng/mL — AB (ref <500)
Barbiturates: NEGATIVE ng/mL (ref <300)
Benzodiazepines: POSITIVE ng/mL — AB (ref <100)
Cocaine Metabolite: NEGATIVE ng/mL (ref <150)
Desmethyltramadol: NEGATIVE ng/mL (ref <100)
Hydroxyethylflurazepam: NEGATIVE ng/mL (ref <50)
Lorazepam: NEGATIVE ng/mL (ref <50)
Methamphetamine: NEGATIVE ng/mL (ref <250)
Nordiazepam: NEGATIVE ng/mL (ref <50)
Opiates: NEGATIVE ng/mL (ref <100)
Oxazepam: NEGATIVE ng/mL (ref <50)
Oxycodone: NEGATIVE ng/mL (ref <100)
Temazepam: NEGATIVE ng/mL (ref <50)
Tramadol: NEGATIVE ng/mL (ref <100)

## 2020-10-22 LAB — DM TEMPLATE

## 2020-10-27 ENCOUNTER — Other Ambulatory Visit: Payer: Self-pay

## 2020-10-27 DIAGNOSIS — E7800 Pure hypercholesterolemia, unspecified: Secondary | ICD-10-CM

## 2020-10-27 MED ORDER — ATORVASTATIN CALCIUM 80 MG PO TABS: 80.0000 mg | ORAL_TABLET | Freq: Every day | ORAL | 5 refills | Status: DC

## 2020-11-15 ENCOUNTER — Emergency Department (HOSPITAL_BASED_OUTPATIENT_CLINIC_OR_DEPARTMENT_OTHER): Payer: Self-pay

## 2020-11-15 ENCOUNTER — Emergency Department (HOSPITAL_BASED_OUTPATIENT_CLINIC_OR_DEPARTMENT_OTHER)
Admission: EM | Admit: 2020-11-15 | Discharge: 2020-11-15 | Disposition: A | Discharge status: Home / Self Care | Payer: Self-pay | Attending: Emergency Medicine | Admitting: Emergency Medicine

## 2020-11-15 ENCOUNTER — Other Ambulatory Visit: Payer: Self-pay

## 2020-11-15 ENCOUNTER — Encounter (HOSPITAL_BASED_OUTPATIENT_CLINIC_OR_DEPARTMENT_OTHER): Payer: Self-pay | Admitting: *Deleted

## 2020-11-15 DIAGNOSIS — Z853 Personal history of malignant neoplasm of breast: Secondary | ICD-10-CM | POA: Insufficient documentation

## 2020-11-15 DIAGNOSIS — X58XXXA Exposure to other specified factors, initial encounter: Secondary | ICD-10-CM | POA: Insufficient documentation

## 2020-11-15 DIAGNOSIS — E1140 Type 2 diabetes mellitus with diabetic neuropathy, unspecified: Secondary | ICD-10-CM | POA: Insufficient documentation

## 2020-11-15 DIAGNOSIS — S90122A Contusion of left lesser toe(s) without damage to nail, initial encounter: Secondary | ICD-10-CM | POA: Insufficient documentation

## 2020-11-15 DIAGNOSIS — Z87891 Personal history of nicotine dependence: Secondary | ICD-10-CM | POA: Insufficient documentation

## 2020-11-15 DIAGNOSIS — L819 Disorder of pigmentation, unspecified: Secondary | ICD-10-CM

## 2020-11-15 DIAGNOSIS — J4540 Moderate persistent asthma, uncomplicated: Secondary | ICD-10-CM | POA: Insufficient documentation

## 2020-11-15 DIAGNOSIS — Z7984 Long term (current) use of oral hypoglycemic drugs: Secondary | ICD-10-CM | POA: Insufficient documentation

## 2020-11-15 DIAGNOSIS — L608 Other nail disorders: Secondary | ICD-10-CM | POA: Insufficient documentation

## 2020-11-15 MED ORDER — DOXYCYCLINE HYCLATE 100 MG PO CAPS: 100.0000 mg | ORAL_CAPSULE | Freq: Two times a day (BID) | ORAL | 0 refills | Status: DC

## 2020-11-15 NOTE — ED Triage Notes (Signed)
She woke this am and her left 2nd toe was black. She is a diabetic with neuropathy.

## 2020-11-15 NOTE — ED Provider Notes (Signed)
[facility] EMERGENCY DEPARTMENT Provider Note   CSN: [id number] Arrival date & time: 11/15/20  2137     History No chief complaint on file.   [name] is a 55 y.o. female.  55 yo F with   The history is provided by the patient.  Illness Severity:  Moderate Onset quality:  Gradual Duration:  2 days Timing:  Constant Progression:  Worsening Chronicity:  New Associated symptoms: no chest pain, no congestion, no fever, no headaches, no myalgias, no nausea, no rhinorrhea, no shortness of breath, no vomiting and no wheezing       Past Medical History:  Diagnosis Date   Allergic rhinitis    Anxiety and depression    Attention deficit disorder (ADD) in adult    Atypical chest pain    Myocardial perf imaging 04/04/16: Low risk stress nuclear study with prior inferior infarct and mild peri-infarct ischemia; EF 55 with mild hypokinesis of the inferior wall.  Cath normal, so stress test was FALSE POS.   Cholelithiasis 03/2016   u/s: no signs of cholecystitis   Fibromyalgia syndrome    GERD (gastroesophageal reflux disease)    History of breast cancer 2012   left mastectomy and chemotherapy   History of chemotherapy    2012 completed   Hyperlipidemia    Hypertension    Moderate persistent asthma    dx'd age 38 (Dr. Gwenette Greet) hx of noncompliance with controller therapy due to cost. PFTs 01/2019->mild small airways obst dz, no resp to bronchodil. Trelegy started 02/2019 by Dr. Ander Slade. Breztri helpful as of 2021.   Morbid obesity ([place])    NASH (nonalcoholic steatohepatitis) 02/2010   u/s confirmed 02/2010 (hx of transaminitis).  Repeat u/s 03/2016 showed no sign of cirrhosis or liver mass, just moderate hepatic steatosis. AST/ALT stable 06/2019   Neuropathy of both upper extremities    and both feet   OSA (obstructive sleep apnea)    Dr Gwenette Greet initially: pt couldn't tolerate CPAP.  Re-eval 12/2017 re-confirmed severe OSA, CPAP at 11 recommended.   Ovarian mass     resected 07/12/10   RLS (restless legs syndrome)    Type II or unspecified type diabetes mellitus without mention of complication, uncontrolled 05/15/2012    Patient Active Problem List   Diagnosis Date Noted   Abnormal nuclear stress test 04/12/2016   Cervical cancer screening 09/20/2015   UTI (urinary tract infection) 08/27/2015   Maxillary sinusitis, acute 06/22/2015   Lymphadenitis 06/22/2015   Infection of urinary tract 02/12/2015   Eustachian tube dysfunction 08/31/2013   BPPV (benign paroxysmal positional vertigo) 04/21/2013   Adult ADHD 04/21/2013   Cellulitis 03/07/2013   Obesity, Class III, BMI 40-49.9 (morbid obesity) ([place]) 06/24/2012   Diabetes mellitus without complication ([place]) 79/04/4095   Peripheral neuropathy 08/18/2011   Breast cancer of upper-inner quadrant of left female breast ([place]) 04/19/2011   TRANSAMINASES, SERUM, ELEVATED 05/09/2010   Hyperlipidemia, mixed 04/26/2010   SMOKER 04/26/2010   Fatty liver 02/10/2010   Essential hypertension 04/18/2007   Allergic rhinitis 04/18/2007   Asthma, mild persistent 04/18/2007   Obstructive sleep apnea 04/10/2007   RESTLESS LEGS SYNDROME 04/10/2007    Past Surgical History:  Procedure Laterality Date   BILATERAL SALPINGOOPHORECTOMY  07/12/2010   enlarged ovary   CARDIOVASCULAR STRESS TEST  04/04/2016   Low risk stress nuclear study with prior inferior infarct and mild peri-infarct ischemia; EF 55% with mild hypokinesis of the inferior wall.  F/u cath was NORMAL, so this is suspected to  be a false pos stress test (diaphragmatic attenuation)   G 2 P 2     LEFT HEART CATH AND CORONARY ANGIOGRAPHY N/A 04/20/2016   Normal coronaries.  EF 55-65%, normal LV function. (Stress test prior to this was false pos due to diaphragmatic attenuation). Procedure: Left Heart Cath and Coronary Angiography;  Surgeon: Peter M Martinique, MD;  Location: [facility];  Service: Cardiovascular;  Laterality: N/A;   MASTECTOMY  06/21/2010    Left ; Dr [facility]     OB History   No obstetric history on file.     Family History  Adopted: Yes  Problem Relation Age of Onset   Healthy Son        1   Healthy Daughter        71    Social History   Tobacco Use   Smoking status: Former    Packs/day: 0.50    Years: 24.00    Pack years: 12.00    Types: Cigarettes    Quit date: 08/16/2018    Years since quitting: 2.2   Smokeless tobacco: Never  Vaping Use   Vaping Use: Never used  Substance Use Topics   Alcohol use: No   Drug use: No    Home Medications Prior to Admission medications   Medication Sig Start Date End Date Taking? Authorizing Provider  doxycycline (VIBRAMYCIN) 100 MG capsule Take 1 capsule (100 mg total) by mouth 2 (two) times daily. One po bid x 7 days 11/15/20  Yes Deno Etienne, DO  albuterol (PROVENTIL) (2.5 MG/3ML) 0.083% nebulizer solution Take 3 mLs (2.5 mg total) by nebulization every 6 (six) hours as needed for wheezing or shortness of breath. 01/23/20   McGowen, Adrian Blackwater, MD  albuterol (VENTOLIN HFA) 108 (90 Base) MCG/ACT inhaler INHALE ONE PUFF BY MOUTH EVERY 6 HOURS AS NEEDED FOR FOR SHORTNESS OF BREATH OR WHEEZING 10/18/20   McGowen, Adrian Blackwater, MD  amphetamine-dextroamphetamine (ADDERALL) 20 MG tablet Take 1 tablet (20 mg total) by mouth 2 (two) times daily. 10/01/20   McGowen, Adrian Blackwater, MD  atorvastatin (LIPITOR) 80 MG tablet Take 1 tablet (80 mg total) by mouth daily. 10/27/20   McGowen, Adrian Blackwater, MD  CALCIUM-VITAMIN D PO Take by mouth.    [provider]  cetirizine (ZYRTEC) 10 MG tablet Take 10 mg by mouth daily.    [provider]  citalopram (CELEXA) 40 MG tablet TAKE ONE TABLET BY MOUTH DAILY 07/27/20   McGowen, Adrian Blackwater, MD  clonazePAM (KLONOPIN) 0.5 MG tablet TAKE ONE TABLET BY MOUTH TWICE A DAY AS NEEDED 05/13/20   McGowen, Adrian Blackwater, MD  fluorouracil (EFUDEX) 5 % cream Apply topically 2 (two) times daily. 10/18/20   McGowen, Adrian Blackwater, MD  ibuprofen (ADVIL,MOTRIN) 200  MG tablet Take 800 mg by mouth as needed for fever or moderate pain.     [provider]  Insulin Pen Needle (BD PEN NEEDLE NANO U/F) 32G X 4 MM MISC Use with Victoza as directed once daily 11/06/19   McGowen, Adrian Blackwater, MD  MAGNESIUM OXIDE PO Take 800 mg by mouth daily.    [provider]  metFORMIN (GLUCOPHAGE) 1000 MG tablet Take 1 tablet (1,000 mg total) by mouth 2 (two) times daily with a meal. 04/02/20   McGowen, Adrian Blackwater, MD  Multiple Vitamin (MULTIVITAMIN) tablet Take 1 tablet by mouth daily.    [provider]  Omega-3 Fatty Acids (FISH OIL) 1200 MG CPDR Take by mouth.  [provider]  pregabalin (LYRICA) 150 MG capsule TAKE ONE CAPSULE BY MOUTH TWICE A DAY 08/27/20   McGowen, Adrian Blackwater, MD  rOPINIRole (REQUIP) 0.5 MG tablet TAKE ONE TABLET BY MOUTH DAILY 07/22/20   McGowen, Adrian Blackwater, MD  triamterene-hydrochlorothiazide (MAXZIDE-25) 37.5-25 MG tablet TAKE 1/2 TABLET BY MOUTH DAILY 07/27/20   McGowen, Adrian Blackwater, MD    Allergies    Metoprolol, Sulfa antibiotics, and Verapamil hcl er  Review of Systems   Review of Systems  Constitutional:  Negative for chills and fever.  HENT:  Negative for congestion and rhinorrhea.   Eyes:  Negative for redness and visual disturbance.  Respiratory:  Negative for shortness of breath and wheezing.   Cardiovascular:  Negative for chest pain and palpitations.  Gastrointestinal:  Negative for nausea and vomiting.  Genitourinary:  Negative for dysuria and urgency.  Musculoskeletal:  Negative for arthralgias and myalgias.  Skin:  Positive for color change. Negative for pallor and wound.  Neurological:  Negative for dizziness and headaches.   Physical Exam Updated Vital Signs BP (!) 136/95 (BP Location: Right Arm)   Pulse (!) 115   Temp 99.5 F (37.5 C) (Oral)   Resp 18   Ht 5' 5"  (1.651 m)   Wt 113.5 kg   LMP 07/14/2010 Comment: ovaries removed 2012  SpO2 92%   BMI 41.64 kg/m   Physical Exam Vitals and nursing  note reviewed.  Constitutional:      General: She is not in acute distress.    Appearance: She is well-developed. She is not diaphoretic.  HENT:     Head: Normocephalic and atraumatic.  Eyes:     Pupils: Pupils are equal, round, and reactive to light.  Cardiovascular:     Rate and Rhythm: Normal rate and regular rhythm.     Heart sounds: No murmur heard.   No friction rub. No gallop.  Pulmonary:     Effort: Pulmonary effort is normal.     Breath sounds: No wheezing or rales.  Abdominal:     General: There is no distension.     Palpations: Abdomen is soft.     Tenderness: There is no abdominal tenderness.  Musculoskeletal:        General: No tenderness.     Cervical back: Normal range of motion and neck supple.     Comments: Mild erythema versus of the paronychial region without obvious fluctuance.  There is some bruising to the medial and lateral aspect of the toe.  No obvious necrosis.  Cap refill is less than 2 seconds.  2+ dorsalis pedis pulse.  No sensation to the foot which is chronic  Skin:    General: Skin is warm and dry.  Neurological:     Mental Status: She is alert and oriented to person, place, and time.  Psychiatric:        Behavior: Behavior normal.    ED Results / Procedures / Treatments   Labs (all labs ordered are listed, but only abnormal results are displayed) Labs Reviewed - No data to display  EKG None  Radiology DG Toe 2nd Left  Result Date: 11/15/2020 CLINICAL DATA:  Bruising. Patient reports she woke with black second toe. Diabetic patient with history of neuropathy. EXAM: LEFT SECOND TOE COMPARISON:  None. FINDINGS: There is no evidence of fracture or dislocation. There is no evidence of arthropathy or other focal bone abnormality. No erosion, periosteal reaction, or bone destruction. No soft tissue air or radiopaque foreign body adjacent to  the second toe. There is a small foreign body in the plantar soft tissues that is likely subjacent to the third  or fourth toe toe. IMPRESSION: 1. Unremarkable radiographic appearance of the second toe. 2. Small foreign body in the plantar soft tissues, likely subjacent to the third toe. Electronically Signed   By: Keith Rake M.D.   On: 11/15/2020 22:24    Procedures Procedures   Medications Ordered in ED Medications - No data to display  ED Course  I have reviewed the triage vital signs and the nursing notes.  Pertinent labs & imaging results that were available during my care of the patient were reviewed by me and considered in my medical decision making (see chart for details).    MDM Rules/Calculators/A&P                           55 yo F with a chief complaints of concern for darkness to the left second digit.  This is been going on just this morning.  He is she has diabetic neuropathy and has limited to no sensation of her feet.  Clinically it looks more bruised.  We will obtain a plain film to assess for fracture.  Will start on prophylactic antibiotics.  PCP follow-up.  Plain film viewed by me without fracture.  Will discharge home.  PCP follow-up.  10:33 PM:  I have discussed the diagnosis/risks/treatment options with the patient and believe the pt to be eligible for discharge home to follow-up with PCP. We also discussed returning to the ED immediately if new or worsening sx occur. We discussed the sx which are most concerning (e.g., sudden worsening pain, fever, inability to tolerate by mouth) that necessitate immediate return. Medications administered to the patient during their visit and any new prescriptions provided to the patient are listed below.  Medications given during this visit Medications - No data to display   The patient appears reasonably screen and/or stabilized for discharge and I doubt any other medical condition or other [facility] requiring further screening, evaluation, or treatment in the ED at this time prior to discharge.   Final Clinical Impression(s) / ED  Diagnoses Final diagnoses:  Discoloration of skin of toe    Rx / DC Orders ED Discharge Orders          Ordered    doxycycline (VIBRAMYCIN) 100 MG capsule  2 times daily        11/15/20 2231             Deno Etienne, DO 11/15/20 2233

## 2020-11-15 NOTE — Discharge Instructions (Addendum)
Follow-up with your family doctor.  Please return for further rapid spreading redness or revealed fever.

## 2020-11-24 ENCOUNTER — Other Ambulatory Visit: Payer: Self-pay | Admitting: Family Medicine

## 2020-11-26 ENCOUNTER — Telehealth: Payer: Self-pay

## 2020-11-26 ENCOUNTER — Other Ambulatory Visit: Payer: Self-pay | Admitting: Family Medicine

## 2020-11-26 NOTE — Telephone Encounter (Signed)
Requesting: klonopin Contract:10/18/20 UDS:10/18/20 Last Visit:10/18/20 Next Visit: advised to f/u 3 mo RCI, CPE 6-7 mo. Last Refill: 05/13/20(60,5)  Please Advise. Medication pending

## 2020-11-26 NOTE — Addendum Note (Signed)
Addended by: Deveron Furlong D on: 11/26/2020 09:41 AM   Modules accepted: Orders

## 2020-11-26 NOTE — Telephone Encounter (Signed)
[facility] - Day Physician Crissie Sickles - MD Contact Type Call Who Is Calling Patient / Member / Family / Caregiver Call Type Triage / Clinical Relationship To Patient Self Return Phone Number [phone] (Primary) Chief Complaint Prescription Refill or Medication Request (non symptomatic) Reason for Call Medication Question / Request Initial Comment Caller states she needs a refill for her prescription, she is almost our of her medication. Also has a few question regading some other medication. Translation No Nurse Assessment Nurse: Jimmye Norman, RN, Armen Pickup Date/Time Eilene Ghazi Time): 11/25/2020 6:14:44 PM Confirm and document reason for call. If symptomatic, describe symptoms. ---Caller states she has 2 different bottles of atorvastatin 31m and 860m states she is not sure which she should take. Needs refill on Fluticsol cream. Does the patient have any new or worsening symptoms? ---No Please document clinical information provided and list any resource used. ---Advised to call MD office during normal business hours Disp. Time (EEilene Ghaziime) Disposition Final User 11/25/2020 6:07:14 PM Send To RN Personal Solocinski, RN, Beth 11/25/2020 6:22:13 PM Clinical Call Yes WiJimmye NormanRN, BeArmen Pickupaller Disagree/Comply Comply Caller Understands Yes PreDisposition Call Doctor Comments User: BeArmen PickupWiJimmye NormanRN Date/Time (EEilene Ghaziime): 11/25/2020 6:21:12 PM 129/89 current B

## 2020-11-26 NOTE — Telephone Encounter (Signed)
Pt advised dose for atorvastatin was increased back in May and should be taking 74m dose. Last refill for this medication was 10/27/20 #30 with 5 refills. She voiced understanding. She inquired about refill for fluticasone cream, last refill 09/04/19-06/08/20. Current rx for fluorouracil 5% cream is too expensive and does not work as well. Advised  provider is out of office but would need approval since med is no longer on current med list.    Please review and advise, medication pending

## 2020-11-29 ENCOUNTER — Telehealth: Payer: Self-pay

## 2020-11-29 ENCOUNTER — Other Ambulatory Visit: Payer: Self-pay | Admitting: Family Medicine

## 2020-11-29 ENCOUNTER — Other Ambulatory Visit: Payer: Self-pay

## 2020-11-29 MED ORDER — CITALOPRAM HYDROBROMIDE 40 MG PO TABS: 40.0000 mg | ORAL_TABLET | Freq: Every day | ORAL | 0 refills | Status: DC

## 2020-11-29 MED ORDER — FLUTICASONE PROPIONATE 0.05 % EX CREA: TOPICAL_CREAM | Freq: Two times a day (BID) | CUTANEOUS | 1 refills | Status: AC

## 2020-11-29 MED ORDER — TRIAMTERENE-HCTZ 37.5-25 MG PO TABS: 0.5000 | ORAL_TABLET | Freq: Every day | ORAL | 0 refills | Status: DC

## 2020-11-29 NOTE — Telephone Encounter (Signed)
Fluticasone eRx'd

## 2020-11-29 NOTE — Telephone Encounter (Signed)
Patient refill requests.  Patient states she has tried to get these filled for the last 2 weeks.  She states her pharmacy has conducted also.  [name] -  [place]  citalopram (CELEXA) 40 MG tablet [id number]    triamterene-hydrochlorothiazide (AJHHIDU-37) 37.5-25 MG tablet [[id number]]

## 2020-11-29 NOTE — Telephone Encounter (Signed)
Pt was last seen 10/18/20, advised to f/u 3 mo. Refills provided and pt has been advised of rx update

## 2020-11-30 NOTE — Telephone Encounter (Signed)
Pt advised rx sent to pharmacy.

## 2020-12-01 ENCOUNTER — Telehealth: Payer: Self-pay

## 2020-12-01 NOTE — Telephone Encounter (Signed)
Patient states she is confused as she is supposed to be taking Atorvastatin 40 MG but has a recent prescription with 80 MG. She said her blood pressure has been within normal range and just is wondering which dose she needs to take.

## 2020-12-01 NOTE — Telephone Encounter (Signed)
Spoke with pt regarding medication and instructions.  

## 2020-12-01 NOTE — Telephone Encounter (Signed)
LVM for pt to CB regarding med questions.   Note:  Alexa Sou, MD  03/14/2020  5:58 PM EST     Labs all good except cholesterol has gone up significantly and I recommend she increase atorvastatin to 80 mg per day.  Pls eRx atorvastatin 76m tabs, 1 tab po qd, #30, RF x 5. We'll recheck cholesterol levels at next office f/u visit in 3-4 mo.

## 2020-12-10 ENCOUNTER — Ambulatory Visit: Payer: Self-pay | Admitting: Family Medicine

## 2020-12-24 ENCOUNTER — Other Ambulatory Visit: Payer: Self-pay

## 2020-12-24 MED ORDER — AMPHETAMINE-DEXTROAMPHETAMINE 20 MG PO TABS: 20.0000 mg | ORAL_TABLET | Freq: Two times a day (BID) | ORAL | 0 refills | Status: DC

## 2020-12-24 NOTE — Telephone Encounter (Signed)
Pt advised rx sent

## 2020-12-24 NOTE — Telephone Encounter (Signed)
Adderall erx'd

## 2020-12-24 NOTE — Telephone Encounter (Signed)
Requesting: adderall Contract:10/18/20 UDS:10/18/20 Last Visit:10/18/20 Next Visit:01/26/21 Last Refill:10/01/20(60,0)  Please Advise. Medication pending

## 2020-12-29 ENCOUNTER — Telehealth: Payer: Self-pay

## 2020-12-29 NOTE — Telephone Encounter (Signed)
LM for pt to returncall

## 2020-12-29 NOTE — Telephone Encounter (Signed)
Spoke with pt, she has been having cramps in lower legs. Advised to try drinking tonic water first and if not improving by next scheduled appt, let us now. Pt voiced understanding.

## 2020-12-29 NOTE — Telephone Encounter (Signed)
Patient states she is still having cramps.  She is taking 1058m Magnesium.  She is wondering if she can increase medication or what are next steps?  Please call [phone]

## 2020-12-29 NOTE — Telephone Encounter (Signed)
Spoke with pt, she has been taking 1073m of magnesium for about a year now. Yesterday cramps started to get really bad, mainly occurring in her legs and torso area. Appt has been scheduled for tomorrow for evaluation if needed.  Please review and advise

## 2020-12-29 NOTE — Telephone Encounter (Signed)
I assume she means muscle cramps in lower legs.  Pls clarify with pt. If yes, then don't increase magnesium.  See if she is drinking 3 oz of tonic water (get at any grocery store) both morning and before bed. If she is ALREADY doing this then I recommend a trial of flexeril 70m, 1 tab po tid prn muscle cramps, #30, RF x 1.--thx

## 2020-12-30 ENCOUNTER — Ambulatory Visit: Payer: Self-pay | Admitting: Family Medicine

## 2021-01-09 ENCOUNTER — Other Ambulatory Visit: Payer: Self-pay | Admitting: Family Medicine

## 2021-01-26 ENCOUNTER — Ambulatory Visit: Payer: Self-pay | Admitting: Family Medicine

## 2021-02-09 ENCOUNTER — Telehealth (INDEPENDENT_AMBULATORY_CARE_PROVIDER_SITE_OTHER): Payer: Self-pay | Admitting: Family Medicine

## 2021-02-09 ENCOUNTER — Other Ambulatory Visit: Payer: Self-pay

## 2021-02-09 ENCOUNTER — Encounter: Payer: Self-pay | Admitting: Family Medicine

## 2021-02-09 DIAGNOSIS — F988 Other specified behavioral and emotional disorders with onset usually occurring in childhood and adolescence: Secondary | ICD-10-CM

## 2021-02-09 DIAGNOSIS — J4551 Severe persistent asthma with (acute) exacerbation: Secondary | ICD-10-CM

## 2021-02-09 DIAGNOSIS — E1140 Type 2 diabetes mellitus with diabetic neuropathy, unspecified: Secondary | ICD-10-CM

## 2021-02-09 DIAGNOSIS — I10 Essential (primary) hypertension: Secondary | ICD-10-CM

## 2021-02-09 DIAGNOSIS — F411 Generalized anxiety disorder: Secondary | ICD-10-CM

## 2021-02-09 MED ORDER — AMPHETAMINE-DEXTROAMPHETAMINE 20 MG PO TABS: 20.0000 mg | ORAL_TABLET | Freq: Two times a day (BID) | ORAL | 0 refills | Status: DC

## 2021-02-09 MED ORDER — PREGABALIN 150 MG PO CAPS: 150.0000 mg | ORAL_CAPSULE | Freq: Two times a day (BID) | ORAL | 5 refills | Status: DC

## 2021-02-09 MED ORDER — PREDNISONE 20 MG PO TABS: ORAL_TABLET | ORAL | 0 refills | Status: DC

## 2021-02-09 MED ORDER — FLUTICASONE PROPIONATE HFA 220 MCG/ACT IN AERO: 2.0000 | INHALATION_SPRAY | Freq: Two times a day (BID) | RESPIRATORY_TRACT | 3 refills | Status: DC

## 2021-02-09 MED ORDER — TRIAMTERENE-HCTZ 37.5-25 MG PO TABS: 1.0000 | ORAL_TABLET | Freq: Every day | ORAL | 3 refills | Status: AC

## 2021-02-09 NOTE — Progress Notes (Signed)
Virtual Visit via Video Note  I connected with [name] on 02/09/21 at  2:30 PM EST by a video enabled telemedicine application and verified that I am speaking with the correct person using two identifiers.  Location patient: home, [place] Location provider:work or home office Persons participating in the virtual visit: patient, provider  I discussed the limitations of evaluation and management by telemedicine and the availability of in person appointments. The patient expressed understanding and agreed to proceed.   HPI: 55 y/o female being seen today for f/u DM, HTN, adult ADD, GAD. I last saw her 06/08/20. A/P as of that visit: "1) DM: tolerating metformin 1000 mg bid. +DPN bilat. No insurance---pt asks to postpone labs so we'll do a1c at next f/u in 28mo   2) HTN: stable on verapamil 1254mbid and 1/2 maxzide 37.5/25 tab qd. Cr and lytes have consistently been normal, most recently 3 mo ago. Ok to defer recheck of these labs until next f/u in 36m56mo 3) HLD: tolerating atorva 60m336m. Hx of chronic mild LFT elevations d/t NAFLD. Recheck lipids and hepatic panel at next f/u 36mo.40mo) Adult ADD: cost issues prevail currently.  We'll see if generic adderall IR 20mg,12mid is affordable---eRx'd #60 today.   5) GAD: stable on citalopram 40mg q98md clonaz 0.5mg qhs60m 6) Fibromyalgia: she is stable on lyrica 150mg bid36md list above lists gabapentin but she is actually taking lyrica 150mg bid,66m gabapentin).  Med list updated but no new rx was sent in for lyrica today.   7) Health maintenance exam: Reviewed age and gender appropriate health maintenance issues (prudent diet, regular exercise, health risks of tobacco and excessive alcohol, use of seatbelts, fire alarms in home, use of sunscreen).  Also reviewed age and gender appropriate health screening as well as vaccine recommendations. Vaccines: ALL UTD. Labs: pt deferred labs today d/t lack of insurance coverage/cost---we'll get some  labs in 3 mo. Cervical ca screening: Her last pap was 09/2015 and was normal.  As per latest guidelines her next PAP is due 09/2020. Breast ca screening:  left mastectomy for breast ca 06/2010:->normal screening mammogram of R breast 04/2019, needs repeat. Colon ca screening: she is due for colon ca screening, options discussed today->not insured currently so she defers for now."  INTERIM HX: All labs stable 4 mo ago. She seems to be doing very good. Through it and exercise she has lost about 20 pounds over the last 3 months. Home blood pressures are consistently around 130-135/90-95. She takes one half of a HCTZ/triamterene 37.5-25 tab daily.  No home glucose monitoring.  Mood and anxiety level stable on citalopram and clonazepam  ADD: Pt states all is going well with the med at current dosing: much improved focus, concentration, task completion.  Less frustration, better multitasking, less impulsivity and restlessness.  Mood is stable. No side effects from the medication.  Fibromyalgia pretty stable on Lyrica 150 twice daily. PMP AWARE reviewed today: most recent rx for adderall was filled 12/24/20, # 60, rx by 30. Most recent clonaz rx filled 01/07/21, #60, rx by me.  Most recent lyrica rx filled 01/07/21, #60, rx by me. No red flags.  Has had 2 weeks of worsening wheezing an feeling of chest congestion.  She has a formal diagnosis of asthma by spirometry.  Unfortunately symptoms have gotten worse ever since quitting smoking.  She has failed multiple maintenance inhalers.  The only 1 seem to make her better  was [place] and she cannot afford this due to not having insurance.  In the remote past she says Flovent did help.  Even before the last 2 weeks she says she has been having to use the albuterol at least once a day, often more.  She is using albuterol very frequently and I am worried about tachyphylaxis.  No fevers. ROS: See pertinent positives and negatives per HPI.  Past Medical  History:  Diagnosis Date   Allergic rhinitis    Anxiety and depression    Attention deficit disorder (ADD) in adult    Atypical chest pain    Myocardial perf imaging 04/04/16: Low risk stress nuclear study with prior inferior infarct and mild peri-infarct ischemia; EF 55 with mild hypokinesis of the inferior wall.  Cath normal, so stress test was FALSE POS.   Cholelithiasis 03/2016   u/s: no signs of cholecystitis   Fibromyalgia syndrome    GERD (gastroesophageal reflux disease)    History of breast cancer 2012   left mastectomy and chemotherapy   History of chemotherapy    2012 completed   Hyperlipidemia    Hypertension    Moderate persistent asthma    dx'd age 53 (Dr. Gwenette Greet) hx of noncompliance with controller therapy due to cost. PFTs 01/2019->mild small airways obst dz, no resp to bronchodil. Trelegy started 02/2019 by Dr. Ander Slade. Breztri helpful as of 2021.   Morbid obesity ([place])    NASH (nonalcoholic steatohepatitis) 02/2010   u/s confirmed 02/2010 (hx of transaminitis).  Repeat u/s 03/2016 showed no sign of cirrhosis or liver mass, just moderate hepatic steatosis. AST/ALT stable 06/2019   Neuropathy of both upper extremities    and both feet   OSA (obstructive sleep apnea)    Dr Gwenette Greet initially: pt couldn't tolerate CPAP.  Re-eval 12/2017 re-confirmed severe OSA, CPAP at 11 recommended.   Ovarian mass    resected 07/12/10   RLS (restless legs syndrome)    Type II or unspecified type diabetes mellitus without mention of complication, uncontrolled 05/15/2012    Past Surgical History:  Procedure Laterality Date   BILATERAL SALPINGOOPHORECTOMY  07/12/2010   enlarged ovary   CARDIOVASCULAR STRESS TEST  04/04/2016   Low risk stress nuclear study with prior inferior infarct and mild peri-infarct ischemia; EF 55% with mild hypokinesis of the inferior wall.  F/u cath was NORMAL, so this is suspected to be a false pos stress test (diaphragmatic attenuation)   G 2 P 2     LEFT HEART CATH  AND CORONARY ANGIOGRAPHY N/A 04/20/2016   Normal coronaries.  EF 55-65%, normal LV function. (Stress test prior to this was false pos due to diaphragmatic attenuation). Procedure: Left Heart Cath and Coronary Angiography;  Surgeon: Peter M Martinique, MD;  Location: [facility];  Service: Cardiovascular;  Laterality: N/A;   MASTECTOMY  06/21/2010   Left ; Dr [facility]     Current Outpatient Medications:    amphetamine-dextroamphetamine (ADDERALL) 20 MG tablet, Take 1 tablet (20 mg total) by mouth 2 (two) times daily., Disp: 60 tablet, Rfl: 0   atorvastatin (LIPITOR) 80 MG tablet, Take 1 tablet (80 mg total) by mouth daily., Disp: 30 tablet, Rfl: 5   CALCIUM-VITAMIN D PO, Take by mouth., Disp: , Rfl:    cetirizine (ZYRTEC) 10 MG tablet, Take 10 mg by mouth daily., Disp: , Rfl:    citalopram (CELEXA) 40 MG tablet, Take 1 tablet (40 mg total) by mouth daily., Disp: 90 tablet, Rfl: 0   clonazePAM (  KLONOPIN) 0.5 MG tablet, TAKE ONE TABLET BY MOUTH TWICE A DAY AS NEEDED, Disp: 60 tablet, Rfl: 5   fluticasone (CUTIVATE) 0.05 % cream, Apply topically 2 (two) times daily., Disp: 60 g, Rfl: 1   ibuprofen (ADVIL,MOTRIN) 200 MG tablet, Take 800 mg by mouth as needed for fever or moderate pain. , Disp: , Rfl:    MAGNESIUM OXIDE PO, Take 800 mg by mouth daily., Disp: , Rfl:    metFORMIN (GLUCOPHAGE) 1000 MG tablet, TAKE ONE TABLET BY MOUTH TWICE A DAY WITH MEAL, Disp: 60 tablet, Rfl: 1   Multiple Vitamin (MULTIVITAMIN) tablet, Take 1 tablet by mouth daily., Disp: , Rfl:    Omega-3 Fatty Acids (FISH OIL) 1200 MG CPDR, Take by mouth., Disp: , Rfl:    pregabalin (LYRICA) 150 MG capsule, TAKE ONE CAPSULE BY MOUTH TWICE A DAY, Disp: 60 capsule, Rfl: 5   rOPINIRole (REQUIP) 0.5 MG tablet, TAKE ONE TABLET BY MOUTH DAILY, Disp: 90 tablet, Rfl: 3   triamterene-hydrochlorothiazide (MAXZIDE-25) 37.5-25 MG tablet, Take 0.5 tablets by mouth daily., Disp: 45 tablet, Rfl: 0   albuterol (PROVENTIL) (2.5 MG/3ML)  0.083% nebulizer solution, Take 3 mLs (2.5 mg total) by nebulization every 6 (six) hours as needed for wheezing or shortness of breath. (Patient not taking: Reported on 02/09/2021), Disp: 75 mL, Rfl: 5   albuterol (VENTOLIN HFA) 108 (90 Base) MCG/ACT inhaler, INHALE ONE PUFF BY MOUTH EVERY 6 HOURS AS NEEDED FOR WHEEZING OR FOR SHORTNESS OF BREATH (Patient not taking: Reported on 02/09/2021), Disp: 18 g, Rfl: 1   fluorouracil (EFUDEX) 5 % cream, Apply topically 2 (two) times daily. (Patient not taking: Reported on 02/09/2021), Disp: 40 g, Rfl: 2  EXAM:  VITALS per patient if applicable:   Vitals with BMI 11/15/2020 10/18/2020 08/04/2020  Height 5' 5"  5' 5"  5' 5"   Weight 250 lbs 3 oz 256 lbs 10 oz 262 lbs  BMI 41.64 83.4 19.6  Systolic 222 979 892  Diastolic 95 79 90  Pulse 119 95 105    GENERAL: alert, oriented, appears well and in no acute distress  HEENT: atraumatic, conjunttiva clear, no obvious abnormalities on inspection of external nose and ears  NECK: normal movements of the head and neck  LUNGS: on inspection no signs of respiratory distress, breathing rate appears normal, no obvious gross SOB, gasping or wheezing  CV: no obvious cyanosis  MS: moves all visible extremities without noticeable abnormality  PSYCH/NEURO: pleasant and cooperative, no obvious depression or anxiety, speech and thought processing grossly intact  Lab Results  Component Value Date   TSH 2.37 12/09/2018   Lab Results  Component Value Date   WBC 9.8 08/01/2019   HGB 13.6 08/01/2019   HCT 40.3 08/01/2019   MCV 91.9 08/01/2019   PLT 273.0 08/01/2019   Lab Results  Component Value Date   CREATININE 0.56 10/18/2020   BUN 13 10/18/2020   NA 138 10/18/2020   K 3.8 10/18/2020   CL 101 10/18/2020   CO2 26 10/18/2020   Lab Results  Component Value Date   ALT 71 (H) 10/18/2020   AST 32 10/18/2020   ALKPHOS 39 10/18/2020   BILITOT 0.5 10/18/2020   Lab Results  Component Value Date   CHOL 117  10/18/2020   Lab Results  Component Value Date   HDL 29.50 (L) 10/18/2020   Lab Results  Component Value Date   LDLCALC 47 12/09/2018   Lab Results  Component Value Date   TRIG 286.0 (H) 10/18/2020  Lab Results  Component Value Date   CHOLHDL 4 10/18/2020   Lab Results  Component Value Date   HGBA1C 6.4 08/04/2020   ASSESSMENT AND PLAN:  Discussed the following assessment and plan:  #1 type 2 diabetes, with diabetic peripheral neuropathy.  Control has been good on metformin 1000 mg twice a day. She will come in for a hemoglobin A1c as soon as possible.  #2 hypertension, uncontrolled.  Increase Maxzide 37.5-25-1 whole tab per day. Electrolytes and creatinine as soon as possible.  #3 anxiety with history of depression, also adult ADD--- she seems to be doing well on citalopram 40 mg a day and clonazepam 0.5 mg twice a day.  Controlled substance contract and urine drug screen are up-to-date.  #4 asthma, poorly controlled, acute exacerbation currently.  Prednisone 40 mg a day x5 days, then 20 mg a day x5 days. Start Flovent 220, 2 puffs twice a day.  Try to limit albuterol to 8 total puffs a day.  I discussed the assessment and treatment plan with the patient. The patient was provided an opportunity to ask questions and all were answered. The patient agreed with the plan and demonstrated an understanding of the instructions.   F/u: 3-6 mo  Signed:  Crissie Sickles, MD           02/09/2021

## 2021-02-14 ENCOUNTER — Telehealth: Payer: Self-pay | Admitting: Family Medicine

## 2021-02-14 NOTE — Telephone Encounter (Signed)
Pt called and had some questions regarding her predniSONE (DELTASONE) 20 MG tablet and her BP meds. She asked for a callback at [phone]

## 2021-02-15 MED ORDER — IRBESARTAN 75 MG PO TABS: 75.0000 mg | ORAL_TABLET | Freq: Every day | ORAL | 1 refills | Status: DC

## 2021-02-15 NOTE — Telephone Encounter (Signed)
Spoke with pt and last night her BP was 156/101 with a different reading this morning of 136/91. She is unsure if this is due to being on prednisone as well. She has 3 more days of the 2 tabs daily then tapers down to 1 tab daily. During last OV, Maxzide dose changed from 0.5 to 1m qd and she has been taking as directed.   Please review and advise

## 2021-02-15 NOTE — Telephone Encounter (Signed)
I recommend she add an additional blood pressure medication. Pls eRx irbesartan 32m, 1 tab po qd, #30, RF x 1. Continue all other meds as-is. Lab orders are in---have her make lab appt for about 1 week from now and have her report/drop off bp numbers at that time.-thx

## 2021-02-15 NOTE — Telephone Encounter (Signed)
Spoke with pt and advised of med instructions, rx sent in. Lab visit scheduled.

## 2021-02-16 ENCOUNTER — Other Ambulatory Visit: Payer: Self-pay | Admitting: Family Medicine

## 2021-02-18 ENCOUNTER — Other Ambulatory Visit: Payer: Self-pay

## 2021-02-18 MED ORDER — ALBUTEROL SULFATE HFA 108 (90 BASE) MCG/ACT IN AERS: INHALATION_SPRAY | RESPIRATORY_TRACT | 1 refills | Status: AC

## 2021-02-23 ENCOUNTER — Ambulatory Visit: Payer: Self-pay

## 2021-03-08 ENCOUNTER — Telehealth: Payer: Self-pay | Admitting: Family Medicine

## 2021-03-08 MED ORDER — METFORMIN HCL 1000 MG PO TABS: ORAL_TABLET | ORAL | 2 refills | Status: DC

## 2021-03-08 NOTE — Telephone Encounter (Signed)
Pt called needing refill on  metFORMIN metFORMIN (GLUCOPHAGE) 1000 MG tablet   [facility] [id number] - [place], [place] Phone:  [phone]  Fax:  [phone]

## 2021-03-08 NOTE — Telephone Encounter (Signed)
Pt advised next f/u due in Feb. Refill sent

## 2021-03-29 ENCOUNTER — Telehealth: Payer: Self-pay | Admitting: Family Medicine

## 2021-03-29 MED ORDER — CITALOPRAM HYDROBROMIDE 40 MG PO TABS: 40.0000 mg | ORAL_TABLET | Freq: Every day | ORAL | 0 refills | Status: DC

## 2021-03-29 NOTE — Telephone Encounter (Signed)
Pt called and said she talked to her pharmacy a week ago and they were supposed to send in a refill on her medication but havent heard anything back. Please advise citalopram (CELEXA) 40 MG tablet   [name]  Address: [address], [place], [place] [address] Phone: [phone]   Call Back if needed [phone]

## 2021-03-29 NOTE — Telephone Encounter (Signed)
Pt was advised refill sent. Last OV 11/30 and 3 month f/u scheduled for 2/15.

## 2021-04-27 ENCOUNTER — Ambulatory Visit (INDEPENDENT_AMBULATORY_CARE_PROVIDER_SITE_OTHER): Payer: Self-pay | Admitting: Family Medicine

## 2021-04-27 ENCOUNTER — Encounter: Payer: Self-pay | Admitting: Family Medicine

## 2021-04-27 ENCOUNTER — Other Ambulatory Visit: Payer: Self-pay

## 2021-04-27 DIAGNOSIS — I10 Essential (primary) hypertension: Secondary | ICD-10-CM

## 2021-04-27 DIAGNOSIS — E1140 Type 2 diabetes mellitus with diabetic neuropathy, unspecified: Secondary | ICD-10-CM

## 2021-04-27 LAB — HEMOGLOBIN A1C: Hgb A1c MFr Bld: 6.5 % (ref 4.6–6.5)

## 2021-04-27 NOTE — Progress Notes (Signed)
OFFICE VISIT  04/27/2021  CC:  Chief Complaint  Patient presents with   Follow-up    RCI, pt is not fasting    HPI:    Patient is a 56 y.o. female who presents for 2.5 mo f/u DM, HTN, asthma, adult ADD, fibromyalgia, and GAD. I last saw her 02/09/21-->virtual visit. A/P as of that visit: "#1 type 2 diabetes, with diabetic peripheral neuropathy.  Control has been good on metformin 1000 mg twice a day. She will come in for a hemoglobin A1c as soon as possible.   #2 hypertension, uncontrolled.  Increase Maxzide 37.5-25-1 whole tab per day. Electrolytes and creatinine as soon as possible.   #3 anxiety with history of depression, also adult ADD--- she seems to be doing well on citalopram 40 mg a day and clonazepam 0.5 mg twice a day.  Controlled substance contract and urine drug screen are up-to-date.   #4 asthma, poorly controlled, acute exacerbation currently.  Prednisone 40 mg a day x5 days, then 20 mg a day x5 days. Start Flovent 220, 2 puffs twice a day.  Try to limit albuterol to 8 total puffs a day."  INTERIM HX: Mickel Baas says she is doing well. She has improved diet significantly and is active taking care of her chronically ill mother.  She has lost 30 pounds purposefully over the last year.  Asthma has been better controlled than last time I saw her. She uses an albuterol nebulizer every night, but has cut down her albuterol HFA use to 1-2 times a month.  It has been too cost prohibitive in the past to get her back on a inhaled steroid.  No home glucose monitoring.  Occasional home blood pressure monitoring is always normal per patient. She did not get the labs that I ordered last visit.  Anxiety level and mood stable.  Taking citalopram 40 mg a day, clonazepam 2 of the 0.5 mg tabs at bedtime, and 1-2 Adderall 20 mg tabs a day.  Pt states all is going well with the med at current dosing: much improved focus, concentration, task completion.  Less frustration, better  multitasking, less impulsivity and restlessness.  Mood is stable. No side effects from the medication.  Says her fibromyalgia has been doing much better on the Lyrica 150 mg twice a day.  PMP AWARE reviewed today: most recent rx for clonaz was filled 04/22/21, # 57, rx by me. Most recent adderall rx filled 02/18/21, #60, rx by me. Most recent lyrica rx filled 03/21/21, #60, rx by me. No red flags.  ROS as above, plus--> no fevers, no CP, no SOB, no wheezing, no cough, no dizziness, no HAs, no rashes, no melena/hematochezia.  No polyuria or polydipsia.  No myalgias or arthralgias.  No focal weakness, paresthesias, or tremors.  No acute vision or hearing abnormalities.  No dysuria or unusual/new urinary urgency or frequency.  No recent changes in lower legs. No n/v/d or abd pain.  No palpitations.    Past Medical History:  Diagnosis Date   Allergic rhinitis    Anxiety and depression    Attention deficit disorder (ADD) in adult    Atypical chest pain    Myocardial perf imaging 04/04/16: Low risk stress nuclear study with prior inferior infarct and mild peri-infarct ischemia; EF 55 with mild hypokinesis of the inferior wall.  Cath normal, so stress test was FALSE POS.   Cholelithiasis 03/2016   u/s: no signs of cholecystitis   Fibromyalgia syndrome    GERD (gastroesophageal reflux  disease)    History of breast cancer 2012   left mastectomy and chemotherapy   History of chemotherapy    2012 completed   Hyperlipidemia    Hypertension    Moderate persistent asthma    dx'd age 31 (Dr. Gwenette Greet) hx of noncompliance with controller therapy due to cost. PFTs 01/2019->mild small airways obst dz, no resp to bronchodil. Trelegy started 02/2019 by Dr. Ander Slade. Breztri helpful as of 2021.   Morbid obesity ([place])    NASH (nonalcoholic steatohepatitis) 02/2010   u/s confirmed 02/2010 (hx of transaminitis).  Repeat u/s 03/2016 showed no sign of cirrhosis or liver mass, just moderate hepatic steatosis. AST/ALT  stable 06/2019   Neuropathy of both upper extremities    and both feet   OSA (obstructive sleep apnea)    Dr Gwenette Greet initially: pt couldn't tolerate CPAP.  Re-eval 12/2017 re-confirmed severe OSA, CPAP at 11 recommended.   Ovarian mass    resected 07/12/10   RLS (restless legs syndrome)    Type II or unspecified type diabetes mellitus without mention of complication, uncontrolled 05/15/2012    Past Surgical History:  Procedure Laterality Date   BILATERAL SALPINGOOPHORECTOMY  07/12/2010   enlarged ovary   CARDIOVASCULAR STRESS TEST  04/04/2016   Low risk stress nuclear study with prior inferior infarct and mild peri-infarct ischemia; EF 55% with mild hypokinesis of the inferior wall.  F/u cath was NORMAL, so this is suspected to be a false pos stress test (diaphragmatic attenuation)   G 2 P 2     LEFT HEART CATH AND CORONARY ANGIOGRAPHY N/A 04/20/2016   Normal coronaries.  EF 55-65%, normal LV function. (Stress test prior to this was false pos due to diaphragmatic attenuation). Procedure: Left Heart Cath and Coronary Angiography;  Surgeon: Peter M Martinique, MD;  Location: [facility];  Service: Cardiovascular;  Laterality: N/A;   MASTECTOMY  06/21/2010   Left ; Dr [facility]    Outpatient Medications Prior to Visit  Medication Sig Dispense Refill   albuterol (PROVENTIL) (2.5 MG/3ML) 0.083% nebulizer solution USE THREE MILLILITERS VIA NEBULIZATION BY MOUTH EVERY 6 HOURS AS NEEDED FOR WHEEZING OR FOR SHORTNESS OF BREATH 75 mL 5   amphetamine-dextroamphetamine (ADDERALL) 20 MG tablet Take 1 tablet (20 mg total) by mouth 2 (two) times daily. 60 tablet 0   atorvastatin (LIPITOR) 80 MG tablet Take 1 tablet (80 mg total) by mouth daily. 30 tablet 5   CALCIUM-VITAMIN D PO Take by mouth.     cetirizine (ZYRTEC) 10 MG tablet Take 10 mg by mouth daily.     citalopram (CELEXA) 40 MG tablet Take 1 tablet (40 mg total) by mouth daily. 90 tablet 0   clonazePAM (KLONOPIN) 0.5 MG tablet TAKE ONE  TABLET BY MOUTH TWICE A DAY AS NEEDED 60 tablet 5   fluticasone (CUTIVATE) 0.05 % cream Apply topically 2 (two) times daily. 60 g 1   ibuprofen (ADVIL,MOTRIN) 200 MG tablet Take 800 mg by mouth as needed for fever or moderate pain.      irbesartan (AVAPRO) 75 MG tablet Take 1 tablet (75 mg total) by mouth daily. 30 tablet 1   MAGNESIUM OXIDE PO Take 800 mg by mouth daily.     metFORMIN (GLUCOPHAGE) 1000 MG tablet TAKE ONE TABLET BY MOUTH TWICE A DAY WITH MEAL 60 tablet 2   Multiple Vitamin (MULTIVITAMIN) tablet Take 1 tablet by mouth daily.     Omega-3 Fatty Acids (FISH OIL) 1200 MG CPDR Take by mouth.  pregabalin (LYRICA) 150 MG capsule Take 1 capsule (150 mg total) by mouth 2 (two) times daily. 60 capsule 5   rOPINIRole (REQUIP) 0.5 MG tablet TAKE ONE TABLET BY MOUTH DAILY 90 tablet 3   triamterene-hydrochlorothiazide (MAXZIDE-25) 37.5-25 MG tablet Take 1 tablet by mouth daily. 90 tablet 3   fluticasone (FLOVENT HFA) 220 MCG/ACT inhaler Inhale 2 puffs into the lungs 2 (two) times daily. 36 each 3   predniSONE (DELTASONE) 20 MG tablet 2 tabs po qd x 5d, then 1 tab po qd x 5d 15 tablet 0   albuterol (VENTOLIN HFA) 108 (90 Base) MCG/ACT inhaler INHALE ONE PUFF BY MOUTH EVERY 6 HOURS AS NEEDED FOR WHEEZING OR FOR SHORTNESS OF BREATH (Patient not taking: Reported on 04/27/2021) 18 g 1   fluorouracil (EFUDEX) 5 % cream Apply topically 2 (two) times daily. (Patient not taking: Reported on 02/09/2021) 40 g 2   No facility-administered medications prior to visit.    Allergies  Allergen Reactions   Metoprolol Other (See Comments)    REACTION: " chest pain"   Sulfa Antibiotics Nausea And Vomiting   Verapamil Hcl Er     Increased HR, blood pressure Makes breathing worse    ROS As per HPI  PE: Vitals with BMI 04/27/2021 11/15/2020 10/18/2020  Height 5' 5"  5' 5"  5' 5"   Weight 235 lbs 10 oz 250 lbs 3 oz 256 lbs 10 oz  BMI 39.21 27.74 12.8  Systolic 786 767 209  Diastolic 89 95 79  Pulse 470  115 95     Physical Exam  Gen: Alert, well appearing.  Patient is oriented to person, place, time, and situation. AFFECT: pleasant, lucid thought and speech. JGG:EZMO: no injection, icteris, swelling, or exudate.  EOMI, PERRLA. Mouth: lips without lesion/swelling.  Oral mucosa pink and moist. Oropharynx without erythema, exudate, or swelling.  CV: RRR, no m/r/g.   LUNGS: CTA bilat, nonlabored resps, good aeration in all lung fields. EXT: no clubbing or cyanosis.  no edema.    LABS:  Last CBC Lab Results  Component Value Date   WBC 9.8 08/01/2019   HGB 13.6 08/01/2019   HCT 40.3 08/01/2019   MCV 91.9 08/01/2019   MCH 31.7 11/07/2018   RDW 13.4 08/01/2019   PLT 273.0 29/47/6546   Last metabolic panel Lab Results  Component Value Date   GLUCOSE 152 (H) 10/18/2020   NA 138 10/18/2020   K 3.8 10/18/2020   CL 101 10/18/2020   CO2 26 10/18/2020   BUN 13 10/18/2020   CREATININE 0.56 10/18/2020   GFRNONAA >60 11/07/2018   CALCIUM 9.7 10/18/2020   PROT 6.8 10/18/2020   ALBUMIN 4.1 10/18/2020   BILITOT 0.5 10/18/2020   ALKPHOS 39 10/18/2020   AST 32 10/18/2020   ALT 71 (H) 10/18/2020   ANIONGAP 13 11/07/2018   Last lipids Lab Results  Component Value Date   CHOL 117 10/18/2020   HDL 29.50 (L) 10/18/2020   LDLCALC 47 12/09/2018   LDLDIRECT 57.0 10/18/2020   TRIG 286.0 (H) 10/18/2020   CHOLHDL 4 10/18/2020   Last hemoglobin A1c Lab Results  Component Value Date   HGBA1C 6.4 08/04/2020   Last thyroid functions Lab Results  Component Value Date   TSH 2.37 12/09/2018   IMPRESSION AND PLAN:  #1 asthma, moderate persistent. Not well controlled but due to cost at this point we have no options for better control. She will call if she gets on insurance and let us know which inhaled controller medicine  we could start.  #2 diabetes type 2 without complication. Metformin 1000 mg twice daily, control is good. Hemoglobin A1c and nonfasting glucose today.  #3  hypertension, well controlled on losartan 75 mg a day and Maxzide 25-30 7.5 whole tab daily. Electrolytes and creatinine today  #4 fibromyalgia. Well-controlled on Lyrica 150 mg twice a day. No new prescription was needed today.  5.  GAD, history of major depressive disorder. Doing well on citalopram 40 mg a day and Klonopin 0.5 mg 1-2 times a day. No new prescription needed today.  Essentially no changes made today.  An After Visit Summary was printed and given to the patient.  FOLLOW UP: Return in about 6 months (around 10/25/2021) for routine chronic illness f/u.  Signed:  Crissie Sickles, MD           04/27/2021

## 2021-04-28 ENCOUNTER — Encounter: Payer: Self-pay | Admitting: Family Medicine

## 2021-04-28 LAB — BASIC METABOLIC PANEL
BUN: 15 mg/dL (ref 6–23)
CO2: 29 mEq/L (ref 19–32)
Calcium: 10.4 mg/dL (ref 8.4–10.5)
Chloride: 98 mEq/L (ref 96–112)
Creatinine, Ser: 0.66 mg/dL (ref 0.40–1.20)
GFR: 99.22 mL/min (ref >60.00)
Glucose, Bld: 150 mg/dL — ABNORMAL HIGH (ref 70–99)
Potassium: 4.2 mEq/L (ref 3.5–5.1)
Sodium: 136 mEq/L (ref 135–145)

## 2021-04-29 ENCOUNTER — Other Ambulatory Visit: Payer: Self-pay

## 2021-04-29 MED ORDER — IRBESARTAN 75 MG PO TABS: 75.0000 mg | ORAL_TABLET | Freq: Every day | ORAL | 1 refills | Status: DC

## 2021-05-10 ENCOUNTER — Other Ambulatory Visit: Payer: Self-pay

## 2021-05-11 ENCOUNTER — Encounter: Payer: Self-pay | Admitting: Family Medicine

## 2021-05-11 ENCOUNTER — Telehealth (INDEPENDENT_AMBULATORY_CARE_PROVIDER_SITE_OTHER): Payer: PRIVATE HEALTH INSURANCE | Admitting: Family Medicine

## 2021-05-11 VITALS — BP 120/87

## 2021-05-11 DIAGNOSIS — J4541 Moderate persistent asthma with (acute) exacerbation: Secondary | ICD-10-CM

## 2021-05-11 NOTE — Progress Notes (Signed)
Virtual Visit via Video Note  I connected with [name] on 05/11/21 at  4:00 PM EST by a video enabled telemedicine application and verified that I am speaking with the correct person using two identifiers.  Location patient: [place] Location provider:work or home office Persons participating in the virtual visit: patient, provider  I discussed the limitations and requested verbal permission for telemedicine visit. The patient expressed understanding and agreed to proceed.  HPI: 56 y/o female being seen today for cough. Her asthma has not been ideally controlled of late.  She has not been on any daily controller inhaler due to insurance/coverage issues. About the last 10 days she has noted increase in her cough, some increase in wheezing and shortness of breath, increased need for albuterol.  She has some mild postnasal drip but this is not unusual for her particularly in the mornings. No fever.  No body aches.  Allergies  Allergen Reactions   Metoprolol Other (See Comments)    REACTION: " chest pain"   Sulfa Antibiotics Nausea And Vomiting   Verapamil Hcl Er     Increased HR, blood pressure Makes breathing worse   -COVID-19 vaccine status:  Immunization History  Administered Date(s) Administered   Hepatitis A 11/22/2005   Hepatitis B 07/16/2001   Influenza,inj,Quad PF,6+ Mos 12/10/2012, 01/11/2017, 12/13/2017, 12/09/2018, 03/11/2020   PFIZER(Purple Top)SARS-COV-2 Vaccination 06/17/2019, 07/15/2019   Pneumococcal Polysaccharide-23 12/10/2012   Td 04/17/2003   Tdap 04/21/2013   Typhoid Live 11/22/2005   Yellow Fever 11/22/2005   Zoster Recombinat (Shingrix) 10/03/2017, 12/19/2017     ROS: See pertinent positives and negatives per HPI.  Past Medical History:  Diagnosis Date   Allergic rhinitis    Anxiety and depression    Attention deficit disorder (ADD) in adult    Atypical chest pain    Myocardial perf imaging 04/04/16: Low risk stress nuclear study with prior inferior infarct  and mild peri-infarct ischemia; EF 55 with mild hypokinesis of the inferior wall.  Cath normal, so stress test was FALSE POS.   Cholelithiasis 03/2016   u/s: no signs of cholecystitis   Fibromyalgia syndrome    GERD (gastroesophageal reflux disease)    History of breast cancer 2012   left mastectomy and chemotherapy   History of chemotherapy    2012 completed   Hyperlipidemia    Hypertension    Moderate persistent asthma    dx'd age 61 (Dr. Gwenette Greet) hx of noncompliance with controller therapy due to cost. PFTs 01/2019->mild small airways obst dz, no resp to bronchodil. Trelegy started 02/2019 by Dr. Ander Slade. Breztri helpful as of 2021.   Morbid obesity ([place])    NASH (nonalcoholic steatohepatitis) 02/2010   u/s confirmed 02/2010 (hx of transaminitis).  Repeat u/s 03/2016 showed no sign of cirrhosis or liver mass, just moderate hepatic steatosis. AST/ALT stable 06/2019   Neuropathy of both upper extremities    and both feet   OSA (obstructive sleep apnea)    Dr Gwenette Greet initially: pt couldn't tolerate CPAP.  Re-eval 12/2017 re-confirmed severe OSA, CPAP at 11 recommended.   Ovarian mass    resected 07/12/10   RLS (restless legs syndrome)    Type II or unspecified type diabetes mellitus without mention of complication, uncontrolled 05/15/2012    Past Surgical History:  Procedure Laterality Date   BILATERAL SALPINGOOPHORECTOMY  07/12/2010   enlarged ovary   CARDIOVASCULAR STRESS TEST  04/04/2016   Low risk stress nuclear study with prior inferior infarct and mild peri-infarct ischemia; EF 55% with mild hypokinesis  of the inferior wall.  F/u cath was NORMAL, so this is suspected to be a false pos stress test (diaphragmatic attenuation)   G 2 P 2     LEFT HEART CATH AND CORONARY ANGIOGRAPHY N/A 04/20/2016   Normal coronaries.  EF 55-65%, normal LV function. (Stress test prior to this was false pos due to diaphragmatic attenuation). Procedure: Left Heart Cath and Coronary Angiography;  Surgeon: Peter M  Martinique, MD;  Location: [facility];  Service: Cardiovascular;  Laterality: N/A;   MASTECTOMY  06/21/2010   Left ; Dr [facility]     Current Outpatient Medications:    albuterol (VENTOLIN HFA) 108 (90 Base) MCG/ACT inhaler, INHALE ONE PUFF BY MOUTH EVERY 6 HOURS AS NEEDED FOR WHEEZING OR FOR SHORTNESS OF BREATH, Disp: 18 g, Rfl: 1   fluticasone (CUTIVATE) 0.05 % cream, Apply topically 2 (two) times daily., Disp: 60 g, Rfl: 1   albuterol (PROVENTIL) (2.5 MG/3ML) 0.083% nebulizer solution, USE THREE MILLILITERS VIA NEBULIZATION BY MOUTH EVERY 6 HOURS AS NEEDED FOR WHEEZING OR FOR SHORTNESS OF BREATH, Disp: 75 mL, Rfl: 5   amphetamine-dextroamphetamine (ADDERALL) 20 MG tablet, Take 1 tablet (20 mg total) by mouth 2 (two) times daily., Disp: 60 tablet, Rfl: 0   atorvastatin (LIPITOR) 80 MG tablet, Take 1 tablet (80 mg total) by mouth daily., Disp: 30 tablet, Rfl: 5   CALCIUM-VITAMIN D PO, Take by mouth., Disp: , Rfl:    cetirizine (ZYRTEC) 10 MG tablet, Take 10 mg by mouth daily., Disp: , Rfl:    citalopram (CELEXA) 40 MG tablet, Take 1 tablet (40 mg total) by mouth daily., Disp: 90 tablet, Rfl: 0   clonazePAM (KLONOPIN) 0.5 MG tablet, TAKE ONE TABLET BY MOUTH TWICE A DAY AS NEEDED, Disp: 60 tablet, Rfl: 5   fluorouracil (EFUDEX) 5 % cream, Apply topically 2 (two) times daily. (Patient not taking: Reported on 02/09/2021), Disp: 40 g, Rfl: 2   ibuprofen (ADVIL,MOTRIN) 200 MG tablet, Take 800 mg by mouth as needed for fever or moderate pain. , Disp: , Rfl:    irbesartan (AVAPRO) 75 MG tablet, Take 1 tablet (75 mg total) by mouth daily., Disp: 30 tablet, Rfl: 1   MAGNESIUM OXIDE PO, Take 800 mg by mouth daily., Disp: , Rfl:    metFORMIN (GLUCOPHAGE) 1000 MG tablet, TAKE ONE TABLET BY MOUTH TWICE A DAY WITH MEAL, Disp: 60 tablet, Rfl: 2   Multiple Vitamin (MULTIVITAMIN) tablet, Take 1 tablet by mouth daily., Disp: , Rfl:    Omega-3 Fatty Acids (FISH OIL) 1200 MG CPDR, Take by mouth.,  Disp: , Rfl:    pregabalin (LYRICA) 150 MG capsule, Take 1 capsule (150 mg total) by mouth 2 (two) times daily., Disp: 60 capsule, Rfl: 5   rOPINIRole (REQUIP) 0.5 MG tablet, TAKE ONE TABLET BY MOUTH DAILY, Disp: 90 tablet, Rfl: 3   triamterene-hydrochlorothiazide (MAXZIDE-25) 37.5-25 MG tablet, Take 1 tablet by mouth daily., Disp: 90 tablet, Rfl: 3  EXAM:  VITALS per patient if applicable:   Vitals with BMI 05/11/2021 04/27/2021 11/15/2020  Height - 5' 5"  5' 5"   Weight - 235 lbs 10 oz 250 lbs 3 oz  BMI - 26.33 35.45  Systolic 625 638 937  Diastolic 87 89 95  Pulse - 113 115    GENERAL: alert, oriented, appears well and in no acute distress  HEENT: atraumatic, conjunttiva clear, no obvious abnormalities on inspection of external nose and ears  NECK: normal movements of the head and neck  LUNGS: on inspection no signs of respiratory distress, breathing rate appears normal, no obvious gross SOB, gasping or wheezing  CV: no obvious cyanosis  MS: moves all visible extremities without noticeable abnormality  PSYCH/NEURO: pleasant and cooperative, no obvious depression or anxiety, speech and thought processing grossly intact  LABS: none today    Chemistry      Component Value Date/Time   NA 136 04/27/2021 1406   NA 141 01/15/2015 0911   K 4.2 04/27/2021 1406   K 3.6 01/15/2015 0911   CL 98 04/27/2021 1406   CO2 29 04/27/2021 1406   CO2 27 01/15/2015 0911   BUN 15 04/27/2021 1406   BUN 17.2 01/15/2015 0911   CREATININE 0.66 04/27/2021 1406   CREATININE 0.68 06/20/2019 1341   CREATININE 0.8 01/15/2015 0911      Component Value Date/Time   CALCIUM 10.4 04/27/2021 1406   CALCIUM 10.7 (H) 01/15/2015 0911   ALKPHOS 39 10/18/2020 0943   ALKPHOS 42 01/15/2015 0911   AST 32 10/18/2020 0943   AST 64 (H) 01/15/2015 0911   ALT 71 (H) 10/18/2020 0943   ALT 153 (H) 01/15/2015 0911   BILITOT 0.5 10/18/2020 0943   BILITOT 0.57 01/15/2015 0911      ASSESSMENT AND PLAN:  Discussed  the following assessment and plan:  Poorly controlled moderate persistent asthma versus acute flare of asthma. May have been triggered by allergies lately.  Does not sound like a significant upper respiratory or lower respiratory infection is present. Prednisone 40 mg a day x5 days, then 20 mg a day x5 days. She is going to check with her insurer about coverage maintenance inhaler. Continue albuterol every 4 to 6 hours as needed. Add Mucinex DM every 12 hours as needed. Continue daily antihistamine.  I discussed the assessment and treatment plan with the patient. The patient was provided an opportunity to ask questions and all were answered. The patient agreed with the plan and demonstrated an understanding of the instructions.    F/u:  if not signif improving in 4-5d  Signed:  Crissie Sickles, MD           05/11/2021

## 2021-05-17 ENCOUNTER — Telehealth: Payer: Self-pay

## 2021-05-17 NOTE — Telephone Encounter (Signed)
No recent rx sent for this but mentioned in OV note for prednisone 40 mg a day x5 days, then 20 mg a day x5 days. ? ?Please review and advise ? ?

## 2021-05-17 NOTE — Telephone Encounter (Signed)
Patient had virtual visit with Dr. [name] on 3/1.  Patient thought he was prescribing prednisone or maybe she misunderstood.   [name] [address] ? ?Please advise, [phone] ?

## 2021-05-18 MED ORDER — PREDNISONE 20 MG PO TABS: ORAL_TABLET | ORAL | 0 refills | Status: DC

## 2021-05-18 NOTE — Telephone Encounter (Signed)
I did not do the prescription--please tell her I am very sorry, that is my fault. ?I sent prescription in today. ?

## 2021-05-18 NOTE — Telephone Encounter (Signed)
Pt advised medication has now been sent. ?

## 2021-05-18 NOTE — Addendum Note (Signed)
Addended by: Tammi Sou on: 05/18/2021 12:16 PM ? ? Modules accepted: Orders ? ?

## 2021-05-23 ENCOUNTER — Encounter: Payer: Self-pay | Admitting: Family Medicine

## 2021-05-23 ENCOUNTER — Telehealth (INDEPENDENT_AMBULATORY_CARE_PROVIDER_SITE_OTHER): Payer: PRIVATE HEALTH INSURANCE | Admitting: Family Medicine

## 2021-05-23 ENCOUNTER — Telehealth: Payer: Self-pay

## 2021-05-23 ENCOUNTER — Other Ambulatory Visit: Payer: Self-pay

## 2021-05-23 VITALS — BP 113/78

## 2021-05-23 DIAGNOSIS — J180 Bronchopneumonia, unspecified organism: Secondary | ICD-10-CM

## 2021-05-23 DIAGNOSIS — J4541 Moderate persistent asthma with (acute) exacerbation: Secondary | ICD-10-CM

## 2021-05-23 MED ORDER — PREDNISONE 20 MG PO TABS: ORAL_TABLET | ORAL | 0 refills | Status: DC

## 2021-05-23 MED ORDER — DOXYCYCLINE HYCLATE 100 MG PO CAPS: 100.0000 mg | ORAL_CAPSULE | Freq: Two times a day (BID) | ORAL | 0 refills | Status: AC

## 2021-05-23 NOTE — Telephone Encounter (Signed)
Can you call her pharmacy and see if the pharmacist has a generic steroid inhaler that he recommends?  Her insurance covers 80% of whatever med is rx'd.  Let me know ? ? ?Spoke with pharmacist, cheapest one is the one that we already prescribed, Fluticasone(Flovent HFA) last rx 02/09/21-04/27/21 ? ?

## 2021-05-23 NOTE — Progress Notes (Signed)
Virtual Visit via Video Note  I connected with pt on 05/23/21 at  3:20 PM EDT by a video enabled telemedicine application and verified that I am speaking with the correct person using two identifiers.  Location patient: [place] Location provider:work or home office Persons participating in the virtual visit: patient, provider  I discussed the limitations and requested verbal permission for telemedicine visit. The patient expressed understanding and agreed to proceed.  CC: 56 y/o female being seen today for "lungs tight". I last saw her 05/11/21 for virtual visit. A/P as of that visit:  "Poorly controlled moderate persistent asthma versus acute flare of asthma. May have been triggered by allergies lately.  Does not sound like a significant upper respiratory or lower respiratory infection is present. Prednisone 40 mg a day x5 days, then 20 mg a day x5 days. She is going to check with her insurer about coverage maintenance inhaler. Continue albuterol every 4 to 6 hours as needed. Add Mucinex DM every 12 hours as needed. Continue daily antihistamine."  INTERIM HX: She has feeling of chest tightness and difficulty getting exhaled.  She hears a little bit of wheezing.  Albuterol nebs not helping as much as they used to. Symptoms worse the last for 5 days.  No chest pain or fever. Still not on any inhaled steroids, have had some insurance issues. She has been on the prednisone I sent in for the last 4 days--40 mg a day. No nausea vomiting or diarrhea. No home glucoses checked  Allergies  Allergen Reactions   Metoprolol Other (See Comments)    REACTION: " chest pain"   Sulfa Antibiotics Nausea And Vomiting   Verapamil Hcl Er     Increased HR, blood pressure Makes breathing worse   -COVID-19 vaccine status:  Immunization History  Administered Date(s) Administered   Hepatitis A 11/22/2005   Hepatitis B 07/16/2001   Influenza,inj,Quad PF,6+ Mos 12/10/2012, 01/11/2017, 12/13/2017, 12/09/2018,  03/11/2020   PFIZER(Purple Top)SARS-COV-2 Vaccination 06/17/2019, 07/15/2019   Pneumococcal Polysaccharide-23 12/10/2012   Td 04/17/2003   Tdap 04/21/2013   Typhoid Live 11/22/2005   Yellow Fever 11/22/2005   Zoster Recombinat (Shingrix) 10/03/2017, 12/19/2017     ROS: See pertinent positives and negatives per HPI.  Past Medical History:  Diagnosis Date   Allergic rhinitis    Anxiety and depression    Attention deficit disorder (ADD) in adult    Atypical chest pain    Myocardial perf imaging 04/04/16: Low risk stress nuclear study with prior inferior infarct and mild peri-infarct ischemia; EF 55 with mild hypokinesis of the inferior wall.  Cath normal, so stress test was FALSE POS.   Cholelithiasis 03/2016   u/s: no signs of cholecystitis   Fibromyalgia syndrome    GERD (gastroesophageal reflux disease)    History of breast cancer 2012   left mastectomy and chemotherapy   History of chemotherapy    2012 completed   Hyperlipidemia    Hypertension    Moderate persistent asthma    dx'd age 76 (Dr. Gwenette Greet) hx of noncompliance with controller therapy due to cost. PFTs 01/2019->mild small airways obst dz, no resp to bronchodil. Trelegy started 02/2019 by Dr. Ander Slade. Breztri helpful as of 2021.   Morbid obesity ([place])    NASH (nonalcoholic steatohepatitis) 02/2010   u/s confirmed 02/2010 (hx of transaminitis).  Repeat u/s 03/2016 showed no sign of cirrhosis or liver mass, just moderate hepatic steatosis. AST/ALT stable 06/2019   Neuropathy of both upper extremities    and both feet  OSA (obstructive sleep apnea)    Dr Gwenette Greet initially: pt couldn't tolerate CPAP.  Re-eval 12/2017 re-confirmed severe OSA, CPAP at 11 recommended.   Ovarian mass    resected 07/12/10   RLS (restless legs syndrome)    Type II or unspecified type diabetes mellitus without mention of complication, uncontrolled 05/15/2012    Past Surgical History:  Procedure Laterality Date   BILATERAL SALPINGOOPHORECTOMY   07/12/2010   enlarged ovary   CARDIOVASCULAR STRESS TEST  04/04/2016   Low risk stress nuclear study with prior inferior infarct and mild peri-infarct ischemia; EF 55% with mild hypokinesis of the inferior wall.  F/u cath was NORMAL, so this is suspected to be a false pos stress test (diaphragmatic attenuation)   G 2 P 2     LEFT HEART CATH AND CORONARY ANGIOGRAPHY N/A 04/20/2016   Normal coronaries.  EF 55-65%, normal LV function. (Stress test prior to this was false pos due to diaphragmatic attenuation). Procedure: Left Heart Cath and Coronary Angiography;  Surgeon: Peter M Martinique, MD;  Location: [facility];  Service: Cardiovascular;  Laterality: N/A;   MASTECTOMY  06/21/2010   Left ; Dr [facility]     Current Outpatient Medications:    albuterol (PROVENTIL) (2.5 MG/3ML) 0.083% nebulizer solution, USE THREE MILLILITERS VIA NEBULIZATION BY MOUTH EVERY 6 HOURS AS NEEDED FOR WHEEZING OR FOR SHORTNESS OF BREATH, Disp: 75 mL, Rfl: 5   albuterol (VENTOLIN HFA) 108 (90 Base) MCG/ACT inhaler, INHALE ONE PUFF BY MOUTH EVERY 6 HOURS AS NEEDED FOR WHEEZING OR FOR SHORTNESS OF BREATH, Disp: 18 g, Rfl: 1   amphetamine-dextroamphetamine (ADDERALL) 20 MG tablet, Take 1 tablet (20 mg total) by mouth 2 (two) times daily., Disp: 60 tablet, Rfl: 0   atorvastatin (LIPITOR) 80 MG tablet, Take 1 tablet (80 mg total) by mouth daily., Disp: 30 tablet, Rfl: 5   CALCIUM-VITAMIN D PO, Take by mouth., Disp: , Rfl:    cetirizine (ZYRTEC) 10 MG tablet, Take 10 mg by mouth daily., Disp: , Rfl:    citalopram (CELEXA) 40 MG tablet, Take 1 tablet (40 mg total) by mouth daily., Disp: 90 tablet, Rfl: 0   clonazePAM (KLONOPIN) 0.5 MG tablet, TAKE ONE TABLET BY MOUTH TWICE A DAY AS NEEDED, Disp: 60 tablet, Rfl: 5   doxycycline (VIBRAMYCIN) 100 MG capsule, Take 1 capsule (100 mg total) by mouth 2 (two) times daily for 10 days., Disp: 20 capsule, Rfl: 0   fluticasone (CUTIVATE) 0.05 % cream, Apply topically 2 (two)  times daily., Disp: 60 g, Rfl: 1   ibuprofen (ADVIL,MOTRIN) 200 MG tablet, Take 800 mg by mouth as needed for fever or moderate pain. , Disp: , Rfl:    irbesartan (AVAPRO) 75 MG tablet, Take 1 tablet (75 mg total) by mouth daily., Disp: 30 tablet, Rfl: 1   MAGNESIUM OXIDE PO, Take 800 mg by mouth daily., Disp: , Rfl:    metFORMIN (GLUCOPHAGE) 1000 MG tablet, TAKE ONE TABLET BY MOUTH TWICE A DAY WITH MEAL, Disp: 60 tablet, Rfl: 2   Multiple Vitamin (MULTIVITAMIN) tablet, Take 1 tablet by mouth daily., Disp: , Rfl:    Omega-3 Fatty Acids (FISH OIL) 1200 MG CPDR, Take by mouth., Disp: , Rfl:    pregabalin (LYRICA) 150 MG capsule, Take 1 capsule (150 mg total) by mouth 2 (two) times daily., Disp: 60 capsule, Rfl: 5   rOPINIRole (REQUIP) 0.5 MG tablet, TAKE ONE TABLET BY MOUTH DAILY, Disp: 90 tablet, Rfl: 3   triamterene-hydrochlorothiazide (MAXZIDE-25) 37.5-25  MG tablet, Take 1 tablet by mouth daily., Disp: 90 tablet, Rfl: 3   fluorouracil (EFUDEX) 5 % cream, Apply topically 2 (two) times daily. (Patient not taking: Reported on 02/09/2021), Disp: 40 g, Rfl: 2   predniSONE (DELTASONE) 20 MG tablet, 3 tabs po qd x 2d, then 2 tabs po qd x 5d, then 1 tab po qd x 5d, Disp: 27 tablet, Rfl: 0  EXAM:  VITALS per patient if applicable:  Vitals with BMI 05/23/2021 05/11/2021 04/27/2021  Height - - 5' 5"   Weight - - 235 lbs 10 oz  BMI - - 12.24  Systolic 497 530 051  Diastolic 78 87 89  Pulse - - 113     GENERAL: alert, oriented, appears well and in no acute distress  HEENT: atraumatic, conjunttiva clear, no obvious abnormalities on inspection of external nose and ears  NECK: normal movements of the head and neck  LUNGS: on inspection no signs of respiratory distress, breathing rate appears normal, no obvious gross SOB, gasping or wheezing  CV: no obvious cyanosis  MS: moves all visible extremities without noticeable abnormality  PSYCH/NEURO: pleasant and cooperative, no obvious depression or  anxiety, speech and thought processing grossly intact  ASSESSMENT AND PLAN:  Discussed the following assessment and plan:  Acute asthma exacerbation, moderate persistent asthma poor control. Prednisone 60 mg a day x4 days, then 40 mg a day x5 days, then 20 mg a day x5 days. Doxycycline 100 mg twice a day x10 days. Albuterol nebulizer or HFA every 6 hours as needed. Will ask her pharmacy to recommend a generic inhaled steroid so she can get on some controller therapy. She is likely going to need to get back to her pulmonologist.  I discussed the assessment and treatment plan with the patient. The patient was provided an opportunity to ask questions and all were answered. The patient agreed with the plan and demonstrated an understanding of the instructions.   F/u:  if not improving signif in 2-3d  Signed:  Crissie Sickles, MD           05/23/2021

## 2021-06-08 ENCOUNTER — Other Ambulatory Visit: Payer: Self-pay | Admitting: Family Medicine

## 2021-06-08 MED ORDER — AMPHETAMINE-DEXTROAMPHETAMINE 20 MG PO TABS: 20.0000 mg | ORAL_TABLET | Freq: Two times a day (BID) | ORAL | 0 refills | Status: DC

## 2021-06-08 NOTE — Telephone Encounter (Signed)
Requesting: adderall ?Contract: 10/18/20 ?UDS: 10/18/20 ?Last Visit: 05/23/21 ?Next Visit: N/A ?Last Refill: 02/09/21(60,0) ? ?Please Advise ? ?

## 2021-06-08 NOTE — Telephone Encounter (Signed)
Caller Name: Ahna, pt  ?Call back phone #: [phone] ? ?MEDICATION(S): amphetamine-dextroamphetamine (ADDERALL) 20 MG tablet ? ?Days of Med Remaining: a few days ? ?Has the patient contacted their pharmacy (YES/NO)?  No - controlled ? ?Preferred Pharmacy:  ?[name] - [place], [place] Phone:  [phone]  ?Fax:  [phone]  ?  ? ?

## 2021-06-09 NOTE — Telephone Encounter (Signed)
Pt advised rx sent. ?

## 2021-06-10 ENCOUNTER — Other Ambulatory Visit: Payer: Self-pay

## 2021-06-10 MED ORDER — CLONAZEPAM 0.5 MG PO TABS: 0.5000 mg | ORAL_TABLET | Freq: Two times a day (BID) | ORAL | 5 refills | Status: DC | PRN

## 2021-06-10 NOTE — Telephone Encounter (Signed)
Patient refill request.  [name] [address] ? ?clonazePAM (KLONOPIN) 0.5 MG tablet [[id number]]  ?

## 2021-06-10 NOTE — Addendum Note (Signed)
Addended by: Deveron Furlong D on: 06/10/2021 03:00 PM ? ? Modules accepted: Orders ? ?

## 2021-06-10 NOTE — Telephone Encounter (Signed)
Requesting: Clonazepam ?Contract: 10/18/20 ?UDS: 10/18/20 ?Last Visit: 05/23/21 ?Next Visit: N/A ?Last Refill: 11/29/20(60,5) ? ?Please Advise. Med pending ?

## 2021-06-15 ENCOUNTER — Telehealth: Payer: Self-pay

## 2021-06-15 ENCOUNTER — Other Ambulatory Visit: Payer: Self-pay | Admitting: Family Medicine

## 2021-06-15 MED ORDER — ALBUTEROL SULFATE (2.5 MG/3ML) 0.083% IN NEBU: INHALATION_SOLUTION | RESPIRATORY_TRACT | 5 refills | Status: AC

## 2021-06-15 NOTE — Telephone Encounter (Signed)
Please advise if refill is appropriate 

## 2021-06-15 NOTE — Telephone Encounter (Signed)
Patient called to say she was at pharmacy and her prescription for albuterol nebulizer solution is not ready for pick up.  She states that she requested from the pharmacy last week, pharmacy told patient they sent a fax request last week and another one today. ?Patient states she out of meds and is going to wait there until we approve to fill.  I advised her that everyone is in patient care right now, and I could not give her a time on when it would be called in. ?She got very upset. She asked for [name], and I told her that she was not in office today, and then she said she would just "leave pharmacy, because it wasn't going to get done today." ? ?She has been using med more frequently than what instructions say on RX.  There are no refills left because she has used them all since December.  [name] stated that Dr. [name] was aware of how much extra solution she was using. ?I told patient I would send note back as priority. ? ?[name] [address] ? ?albuterol (PROVENTIL) (2.5 MG/3ML) 0.083% nebulizer solution [[id number]]  ?

## 2021-06-15 NOTE — Telephone Encounter (Signed)
Okay I sent in prescription for albuterol neb solution today.   ?However, I am worried about how poorly controlled her asthma is. ?She needs to see her pulmonologist.  She last saw Dr. Ander Slade with [facility] pulmonology back in 2020.  Please give her [facility]ology phone number so she can call arrange appoint. ?However, if worsening then she needs to make appointment to see me. ?

## 2021-06-16 NOTE — Telephone Encounter (Signed)
Pt returning call to office, informed her someone will contact her on her cell. ?

## 2021-06-16 NOTE — Telephone Encounter (Addendum)
LM for pt to return call.  ? ?Address info:  ?[address] [place], [place] [address] ?Phone: [phone] ?

## 2021-06-16 NOTE — Telephone Encounter (Signed)
FYI-Pt was advised of med rx. She has pulmonology appt for 4/17 ?

## 2021-06-17 NOTE — Telephone Encounter (Signed)
noted 

## 2021-06-25 ENCOUNTER — Other Ambulatory Visit: Payer: Self-pay | Admitting: Family Medicine

## 2021-06-27 ENCOUNTER — Ambulatory Visit (INDEPENDENT_AMBULATORY_CARE_PROVIDER_SITE_OTHER): Payer: PRIVATE HEALTH INSURANCE | Admitting: Pulmonary Disease

## 2021-06-27 ENCOUNTER — Other Ambulatory Visit (HOSPITAL_COMMUNITY): Payer: Self-pay

## 2021-06-27 ENCOUNTER — Ambulatory Visit (INDEPENDENT_AMBULATORY_CARE_PROVIDER_SITE_OTHER): Payer: PRIVATE HEALTH INSURANCE

## 2021-06-27 ENCOUNTER — Encounter: Payer: Self-pay | Admitting: Pulmonary Disease

## 2021-06-27 ENCOUNTER — Telehealth: Payer: Self-pay | Admitting: Pulmonary Disease

## 2021-06-27 VITALS — BP 128/82 | HR 92 | Temp 98.4°F | Ht 64.0 in | Wt 245.0 lb

## 2021-06-27 DIAGNOSIS — R0683 Snoring: Secondary | ICD-10-CM

## 2021-06-27 DIAGNOSIS — R0609 Other forms of dyspnea: Secondary | ICD-10-CM

## 2021-06-27 MED ORDER — ADVAIR HFA 230-21 MCG/ACT IN AERO: 2.0000 | INHALATION_SPRAY | Freq: Two times a day (BID) | RESPIRATORY_TRACT | 6 refills | Status: DC

## 2021-06-27 NOTE — Patient Instructions (Signed)
Obtain chest x-ray today ? ?Schedule for home sleep study ? ?Sample of [place] ? ?Pharmacy referral to help check with inhaler ?-Needs an HFA steroid/LABA inhaler ?-Does not tolerate dry powder inhalers ? ?Tentative follow-up in 3 to 4 months ? ?Call with significant concerns ? ?Encourage weight loss efforts so will next ? ? ?

## 2021-06-27 NOTE — Progress Notes (Signed)
? ?      ?[name]    [id number]    Aug 02, 1965 ? ?Primary Care Physician:McGowen, Adrian Blackwater, MD ? ?Referring Physician: Tammi Sou, MD ?[address] ?[place],  [place] [address] ? ?Chief complaint:   ?Follow-up for asthma and obstructive sleep apnea ? ?HPI: ?Her CPAP stopped working a couple of years ago ? ?She doe have asthma for which she usess Rescue inhaler ?Has not been on a maintenance inhaler ? ?She did have changes to her insurance  ? ?Obtaining a new CPAP machine was not possible as she had changed her insurance and the previous time she was here, a new CPAP was not covered because her machine was only a couple of years old at the time ? ?She was on Symbicort previously ? ?Does not tolerate dry powder inhalers ? ?Did not have any significant difficulty tolerating CPAP in the past ? ?No significant cough or shortness of breath ?She is not as active as she used to be, and has gained weight progressively ? ?She is on Requip for restless legs ? ?She has had increased snoring, increased daytime sleepiness ?Usually goes to bed between 10 and 11 PM ?Does take a little bit of time to fall asleep ? ?Tries to get up in the morning by 10 ? ?Occupation: No pertinent occupational history ?Exposures: No significant exposure ?Smoking history: Never smoker ? ?Outpatient Encounter Medications as of 06/27/2021  ?Medication Sig  ? albuterol (PROVENTIL) (2.5 MG/3ML) 0.083% nebulizer solution USE THREE MILLILITERS VIA NEBULIZATION BY MOUTH EVERY 6 HOURS AS NEEDED FOR WHEEZING OR FOR SHORTNESS OF BREATH  ? albuterol (VENTOLIN HFA) 108 (90 Base) MCG/ACT inhaler INHALE ONE PUFF BY MOUTH EVERY 6 HOURS AS NEEDED FOR WHEEZING OR FOR SHORTNESS OF BREATH  ? amphetamine-dextroamphetamine (ADDERALL) 20 MG tablet Take 1 tablet (20 mg total) by mouth 2 (two) times daily.  ? atorvastatin (LIPITOR) 80 MG tablet Take 1 tablet (80 mg total) by mouth daily.  ? cetirizine (ZYRTEC) 10 MG tablet Take 10 mg by mouth daily.   ? citalopram (CELEXA) 40 MG tablet Take 1 tablet (40 mg total) by mouth daily.  ? clonazePAM (KLONOPIN) 0.5 MG tablet Take 1 tablet (0.5 mg total) by mouth 2 (two) times daily as needed.  ? fluticasone (CUTIVATE) 0.05 % cream Apply topically 2 (two) times daily.  ? ibuprofen (ADVIL,MOTRIN) 200 MG tablet Take 800 mg by mouth as needed for fever or moderate pain.   ? irbesartan (AVAPRO) 75 MG tablet Take 1 tablet (75 mg total) by mouth daily.  ? MAGNESIUM OXIDE PO Take 800 mg by mouth daily.  ? metFORMIN (GLUCOPHAGE) 1000 MG tablet TAKE ONE TABLET BY MOUTH TWICE A DAY WITH MEAL  ? Multiple Vitamin (MULTIVITAMIN) tablet Take 1 tablet by mouth daily.  ? pregabalin (LYRICA) 150 MG capsule Take 1 capsule (150 mg total) by mouth 2 (two) times daily.  ? rOPINIRole (REQUIP) 0.5 MG tablet TAKE ONE TABLET BY MOUTH DAILY  ? triamterene-hydrochlorothiazide (MAXZIDE-25) 37.5-25 MG tablet Take 1 tablet by mouth daily.  ? [DISCONTINUED] CALCIUM-VITAMIN D PO Take by mouth. (Patient not taking: Reported on 06/27/2021)  ? [DISCONTINUED] fluorouracil (EFUDEX) 5 % cream Apply topically 2 (two) times daily. (Patient not taking: Reported on 06/27/2021)  ? [DISCONTINUED] Omega-3 Fatty Acids (FISH OIL) 1200 MG CPDR Take by mouth. (Patient not taking: Reported on 06/27/2021)  ? [DISCONTINUED] predniSONE (DELTASONE) 20 MG tablet 3 tabs po qd x 2d, then 2 tabs po qd  x 5d, then 1 tab po qd x 5d (Patient not taking: Reported on 06/27/2021)  ? ?No facility-administered encounter medications on file as of 06/27/2021.  ? ? ?Allergies as of 06/27/2021 - Review Complete 06/27/2021  ?Allergen Reaction Noted  ? Metoprolol Other (See Comments) 01/21/2007  ? Sulfa antibiotics Nausea And Vomiting 03/28/2013  ? Verapamil hcl er Hypertension 10/18/2020  ? ? ?Past Medical History:  ?Diagnosis Date  ? Allergic rhinitis   ? Anxiety and depression   ? Attention deficit disorder (ADD) in adult   ? Atypical chest pain   ? Myocardial perf imaging 04/04/16: Low risk  stress nuclear study with prior inferior infarct and mild peri-infarct ischemia; EF 55 with mild hypokinesis of the inferior wall.  Cath normal, so stress test was FALSE POS.  ? Cholelithiasis 03/2016  ? u/s: no signs of cholecystitis  ? Fibromyalgia syndrome   ? GERD (gastroesophageal reflux disease)   ? History of breast cancer 2012  ? left mastectomy and chemotherapy  ? History of chemotherapy   ? 2012 completed  ? Hyperlipidemia   ? Hypertension   ? Moderate persistent asthma   ? dx'd age 8 (Dr. Gwenette Greet) hx of noncompliance with controller therapy due to cost. PFTs 01/2019->mild small airways obst dz, no resp to bronchodil. Trelegy started 02/2019 by Dr. Ander Slade. Breztri helpful as of 2021.  ? Morbid obesity ([place])   ? NASH (nonalcoholic steatohepatitis) 02/2010  ? u/s confirmed 02/2010 (hx of transaminitis).  Repeat u/s 03/2016 showed no sign of cirrhosis or liver mass, just moderate hepatic steatosis. AST/ALT stable 06/2019  ? Neuropathy of both upper extremities   ? and both feet  ? OSA (obstructive sleep apnea)   ? Dr Gwenette Greet initially: pt couldn't tolerate CPAP.  Re-eval 12/2017 re-confirmed severe OSA, CPAP at 11 recommended.  ? Ovarian mass   ? resected 07/12/10  ? RLS (restless legs syndrome)   ? Type II or unspecified type diabetes mellitus without mention of complication, uncontrolled 05/15/2012  ? ? ?Past Surgical History:  ?Procedure Laterality Date  ? BILATERAL SALPINGOOPHORECTOMY  07/12/2010  ? enlarged ovary  ? CARDIOVASCULAR STRESS TEST  04/04/2016  ? Low risk stress nuclear study with prior inferior infarct and mild peri-infarct ischemia; EF 55% with mild hypokinesis of the inferior wall.  F/u cath was NORMAL, so this is suspected to be a false pos stress test (diaphragmatic attenuation)  ? G 2 P 2    ? LEFT HEART CATH AND CORONARY ANGIOGRAPHY N/A 04/20/2016  ? Normal coronaries.  EF 55-65%, normal LV function. (Stress test prior to this was false pos due to diaphragmatic attenuation). Procedure: Left Heart  Cath and Coronary Angiography;  Surgeon: Peter M Martinique, MD;  Location: [facility];  Service: Cardiovascular;  Laterality: N/A;  ? MASTECTOMY  06/21/2010  ? Left ; [facility]  ? ? ?Family History  ?Adopted: Yes  ?Problem Relation Age of Onset  ? Healthy Son   ?     66  ? Healthy Daughter   ?     32  ? ? ?Social History  ? ?Socioeconomic History  ? Marital status: Married  ?  Spouse name: Not on file  ? Number of children: 2  ? Years of education: Not on file  ? Highest education level: Not on file  ?Occupational History  ? Occupation: Pharmacist, community  ?  Employer: [employer]  ?Tobacco Use  ? Smoking status: Former  ?  Packs/day: 0.50  ?  Years: 24.00  ?  Pack years: 12.00  ?  Types: Cigarettes  ?  Quit date: 08/16/2018  ?  Years since quitting: 2.8  ? Smokeless tobacco: Never  ?Vaping Use  ? Vaping Use: Never used  ?Substance and Sexual Activity  ? Alcohol use: No  ? Drug use: No  ? Sexual activity: Not on file  ?Other Topics Concern  ? Not on file  ?Social History Narrative  ? Not working as of 09/2017.  Previously worked as a Art therapist.  ? Lives with husband and two children.  ? 2-3 caffeine drinks daily   ?   ? ?Social Determinants of Health  ? ?Financial Resource Strain: Not on file  ?Food Insecurity: Not on file  ?Transportation Needs: Not on file  ?Physical Activity: Not on file  ?Stress: Not on file  ?Social Connections: Not on file  ?Intimate Partner Violence: Not on file  ? ? ?Review of Systems  ?Constitutional:  Positive for fatigue.  ?HENT: Negative.    ?Respiratory:  Positive for apnea and shortness of breath.   ?Cardiovascular: Negative.   ?Gastrointestinal: Negative.   ?Musculoskeletal: Negative.   ?Psychiatric/Behavioral:  Positive for sleep disturbance.   ? ?Vitals:  ? 06/27/21 1135  ?BP: 128/82  ?Pulse: 92  ?Temp: 98.4 ?F (36.9 ?C)  ?SpO2: 97%  ? ? ?Physical Exam ?Constitutional:   ?   Appearance: She is well-developed.  ?HENT:  ?   Head: Normocephalic and atraumatic.  ?Eyes:  ?    General:     ?   Right eye: No discharge.     ?   Left eye: No discharge.  ?   Conjunctiva/sclera: Conjunctivae normal.  ?   Pupils: Pupils are equal, round, and reactive to light.  ?Neck:  ?   [place]

## 2021-06-27 NOTE — Telephone Encounter (Signed)
This patient has new insurance and Dr. Jenetta Downer wants to know white HFA/LABA is covered by her insurance. Please advise.  ?

## 2021-06-28 NOTE — Progress Notes (Signed)
Normal chest x-ray.

## 2021-06-30 NOTE — Telephone Encounter (Signed)
She may need to call her insurance company to figure out what inhaler may be covered cheapest for her- ?combination steroid and long acting albuterol ? ?-Advair ?-Wixela ?-Air duo ?-Symbicort ?-Dulera ?-Breo ? ?are examples ?

## 2021-06-30 NOTE — Telephone Encounter (Signed)
I called the patient and left a message to call back.  ? ?

## 2021-07-01 ENCOUNTER — Other Ambulatory Visit (HOSPITAL_COMMUNITY): Payer: Self-pay

## 2021-07-01 ENCOUNTER — Encounter: Payer: Self-pay | Admitting: Pulmonary Disease

## 2021-07-02 ENCOUNTER — Emergency Department (HOSPITAL_COMMUNITY)
Admission: EM | Admit: 2021-07-02 | Discharge: 2021-07-03 | Disposition: A | Discharge status: Home / Self Care | Payer: No Typology Code available for payment source | Attending: Emergency Medicine | Admitting: Emergency Medicine

## 2021-07-02 ENCOUNTER — Encounter (HOSPITAL_COMMUNITY): Payer: Self-pay | Admitting: Emergency Medicine

## 2021-07-02 ENCOUNTER — Other Ambulatory Visit: Payer: Self-pay

## 2021-07-02 DIAGNOSIS — D72829 Elevated white blood cell count, unspecified: Secondary | ICD-10-CM | POA: Diagnosis not present

## 2021-07-02 DIAGNOSIS — R112 Nausea with vomiting, unspecified: Secondary | ICD-10-CM | POA: Insufficient documentation

## 2021-07-02 DIAGNOSIS — R4182 Altered mental status, unspecified: Secondary | ICD-10-CM | POA: Insufficient documentation

## 2021-07-02 DIAGNOSIS — T452X2A Poisoning by vitamins, intentional self-harm, initial encounter: Secondary | ICD-10-CM | POA: Diagnosis not present

## 2021-07-02 DIAGNOSIS — R5383 Other fatigue: Secondary | ICD-10-CM | POA: Diagnosis not present

## 2021-07-02 DIAGNOSIS — T50901A Poisoning by unspecified drugs, medicaments and biological substances, accidental (unintentional), initial encounter: Secondary | ICD-10-CM

## 2021-07-02 DIAGNOSIS — R7401 Elevation of levels of liver transaminase levels: Secondary | ICD-10-CM | POA: Diagnosis not present

## 2021-07-02 MED ORDER — ONDANSETRON HCL 4 MG/2ML IJ SOLN
4.0000 mg | Freq: Once | INTRAMUSCULAR | Status: AC
  Administered 2021-07-02: 4 mg via INTRAVENOUS
  Filled 2021-07-02: qty 2

## 2021-07-02 MED ORDER — ONDANSETRON HCL 4 MG PO TABS: 4.0000 mg | ORAL_TABLET | Freq: Three times a day (TID) | ORAL | 0 refills | Status: DC | PRN

## 2021-07-02 MED ORDER — SODIUM CHLORIDE 0.9 % IV BOLUS
1000.0000 mL | Freq: Once | INTRAVENOUS | Status: AC
  Administered 2021-07-02: 1000 mL via INTRAVENOUS

## 2021-07-02 NOTE — ED Triage Notes (Signed)
Pt brought in by EMS after she was found going in and out of consciousness on the toilet by her friends after ingesting approximately 292m of Delta-8 gummies. ?

## 2021-07-02 NOTE — ED Provider Notes (Signed)
?[facility] ?Provider Note ? ? ?CSN: [id number] ?Arrival date & time: 07/02/21  2127 ? ?  ? ?History ? ?Chief Complaint  ?Patient presents with  ? Drug Overdose  ? ? ?[name] is a 56 y.o. female.  She is here for altered mental status and vomiting after recreational ingestion of delta 8 gummy.  This occurred about 2 or 3 hours ago.  Daughter states she has been in and out of consciousness and was vomiting a lot at home.  Patient unable to give any history, level 5 caveat ? ?The history is provided by the patient, the EMS personnel and a relative.  ?Drug Overdose ?This is a new problem. The current episode started 3 to 5 hours ago. The problem has not changed since onset.Nothing aggravates the symptoms. Nothing relieves the symptoms. She has tried nothing for the symptoms. The treatment provided no relief.  ? ?  ? ?Home Medications ?Prior to Admission medications   ?Medication Sig Start Date End Date Taking? Authorizing Provider  ?albuterol (PROVENTIL) (2.5 MG/3ML) 0.083% nebulizer solution USE THREE MILLILITERS VIA NEBULIZATION BY MOUTH EVERY 6 HOURS AS NEEDED FOR WHEEZING OR FOR SHORTNESS OF BREATH 06/15/21   McGowen, Adrian Blackwater, MD  ?albuterol (VENTOLIN HFA) 108 (90 Base) MCG/ACT inhaler INHALE ONE PUFF BY MOUTH EVERY 6 HOURS AS NEEDED FOR WHEEZING OR FOR SHORTNESS OF BREATH 02/18/21   McGowen, Adrian Blackwater, MD  ?amphetamine-dextroamphetamine (ADDERALL) 20 MG tablet Take 1 tablet (20 mg total) by mouth 2 (two) times daily. 06/08/21   McGowen, Adrian Blackwater, MD  ?atorvastatin (LIPITOR) 80 MG tablet Take 1 tablet (80 mg total) by mouth daily. 10/27/20   McGowen, Adrian Blackwater, MD  ?cetirizine (ZYRTEC) 10 MG tablet Take 10 mg by mouth daily.    [provider]  ?citalopram (CELEXA) 40 MG tablet TAKE ONE TABLET BY MOUTH DAILY 06/27/21   McGowen, Adrian Blackwater, MD  ?clonazePAM (KLONOPIN) 0.5 MG tablet Take 1 tablet (0.5 mg total) by mouth 2 (two) times daily as needed. 06/10/21   McGowen, Adrian Blackwater, MD   ?fluticasone (CUTIVATE) 0.05 % cream Apply topically 2 (two) times daily. 11/29/20   McGowen, Adrian Blackwater, MD  ?fluticasone-salmeterol (ADVAIR HFA) 230-21 MCG/ACT inhaler Inhale 2 puffs into the lungs 2 (two) times daily. 06/27/21   Laurin Coder, MD  ?ibuprofen (ADVIL,MOTRIN) 200 MG tablet Take 800 mg by mouth as needed for fever or moderate pain.     [provider]  ?irbesartan (AVAPRO) 75 MG tablet Take 1 tablet (75 mg total) by mouth daily. 04/29/21   McGowen, Adrian Blackwater, MD  ?MAGNESIUM OXIDE PO Take 800 mg by mouth daily.    [provider]  ?metFORMIN (GLUCOPHAGE) 1000 MG tablet TAKE ONE TABLET BY MOUTH TWICE A DAY WITH MEAL 03/08/21   McGowen, Adrian Blackwater, MD  ?Multiple Vitamin (MULTIVITAMIN) tablet Take 1 tablet by mouth daily.    [provider]  ?pregabalin (LYRICA) 150 MG capsule Take 1 capsule (150 mg total) by mouth 2 (two) times daily. 02/09/21   McGowen, Adrian Blackwater, MD  ?rOPINIRole (REQUIP) 0.5 MG tablet TAKE ONE TABLET BY MOUTH DAILY 07/22/20   McGowen, Adrian Blackwater, MD  ?triamterene-hydrochlorothiazide (MAXZIDE-25) 37.5-25 MG tablet Take 1 tablet by mouth daily. 02/09/21   McGowen, Adrian Blackwater, MD  ?   ? ?Allergies    ?Metoprolol, Sulfa antibiotics, and Verapamil hcl er   ? ?Review of Systems   ?Review of Systems  ?Unable to perform ROS: Mental status change  ? ?  Physical Exam ?Updated Vital Signs ?BP 109/64 (BP Location: Right Arm)   Pulse 76   Temp (!) 97.4 ?F (36.3 ?C) (Oral)   Resp 12   Ht 5' 4"  (1.626 m)   Wt 111 kg   LMP 07/14/2010 Comment: ovaries removed 2012  SpO2 95%   BMI 42.00 kg/m?  ?Physical Exam ?Vitals and nursing note reviewed.  ?Constitutional:   ?   General: She is not in acute distress. ?   Appearance: Normal appearance. She is well-developed.  ?HENT:  ?   Head: Normocephalic and atraumatic.  ?Eyes:  ?   Conjunctiva/sclera: Conjunctivae normal.  ?Cardiovascular:  ?   Rate and Rhythm: Normal rate and regular rhythm.  ?   Heart sounds: No murmur  heard. ?Pulmonary:  ?   Effort: Pulmonary effort is normal. No respiratory distress.  ?   Breath sounds: Normal breath sounds.  ?Abdominal:  ?   Palpations: Abdomen is soft.  ?   Tenderness: There is no abdominal tenderness. There is no guarding or rebound.  ?Musculoskeletal:     ?   General: No swelling.  ?   Cervical back: Neck supple.  ?   Right lower leg: No edema.  ?   Left lower leg: No edema.  ?Skin: ?   General: Skin is warm and dry.  ?   Capillary Refill: Capillary refill takes less than 2 seconds.  ?Neurological:  ?   General: No focal deficit present.  ?   Mental Status: She is lethargic.  ?   Comments: She is to voice.  She is moving all extremities without any focal deficits.  She is protecting her airway  ? ? ?ED Results / Procedures / Treatments   ?Labs ?(all labs ordered are listed, but only abnormal results are displayed) ?Labs Reviewed  ?COMPREHENSIVE METABOLIC PANEL - Abnormal; Notable for the following components:  ?    Result Value  ? Sodium 134 (*)   ? Glucose, Bld 199 (*)   ? Calcium 8.8 (*)   ? ALT 73 (*)   ? Alkaline Phosphatase 37 (*)   ? All other components within normal limits  ?CBC WITH DIFFERENTIAL/PLATELET - Abnormal; Notable for the following components:  ? WBC 12.6 (*)   ? Neutro Abs 8.9 (*)   ? All other components within normal limits  ?URINALYSIS, ROUTINE W REFLEX MICROSCOPIC  ?RAPID URINE DRUG SCREEN, HOSP PERFORMED  ? ? ?EKG ?EKG Interpretation ? ?Date/Time:  Saturday July 02 2021 22:26:59 EDT ?Ventricular Rate:  77 ?PR Interval:  146 ?QRS Duration: 88 ?QT Interval:  407 ?QTC Calculation: 461 ?R Axis:   20 ?Text Interpretation: Sinus rhythm No significant change since last tracing Confirmed by Aletta Edouard [phone]) on 07/02/2021 10:37:33 PM ? ?Radiology ?No results found. ? ?Procedures ?Procedures  ? ? ?Medications Ordered in ED ?Medications  ?sodium chloride 0.9 % bolus 1,000 mL (has no administration in time range)  ?ondansetron (ZOFRAN) injection 4 mg (has no  administration in time range)  ? ? ?ED Course/ Medical Decision Making/ A&P ?  ?                        ?Medical Decision Making ?Amount and/or Complexity of Data Reviewed ?Labs: ordered. ? ?Risk ?Prescription drug management. ? ?This patient complains of intoxication from gummy, altered mental status, nausea and vomiting; this involves an extensive number of treatment ?Options and is a complaint that carries with it a high risk of complications and ?  morbidity. The differential includes, intoxication, metabolic derangement, dehydration, airway compromise, altered mental status ? ?I ordered, reviewed and interpreted labs, which included CBC with mildly elevated white count, chemistries with elevated glucose minimal elevation of ALT ?I ordered medication IV fluids and nausea medication and reviewed PMP when indicated. ?Additional history obtained from patient's daughter ?Previous records obtained and reviewed in epic no recent admissions ?Cardiac monitoring reviewed, normal sinus rhythm ?Social determinants considered, no significant barriers ?Critical Interventions: None ? ?After the interventions stated above, I reevaluated the patient and found patient still to be altered and not appropriate for discharge ?Admission and further testing considered, patient signed out to oncoming provider Dr. Dina Rich to observe patient until she further metabolizes.  Likely will be able to be discharged in the morning.  Will need airway assessment if mental status further depresses ? ? ? ? ? ? ? ? ? ?Final Clinical Impression(s) / ED Diagnoses ?Final diagnoses:  ?Ingestion of unknown drug, accidental or unintentional, initial encounter  ?Altered mental status, unspecified altered mental status type  ?Nausea and vomiting, unspecified vomiting type  ? ? ?Rx / DC Orders ?ED Discharge Orders   ? ? None  ? ?  ? ? ?  ?Hayden Rasmussen, MD ?07/03/21 [phone] ? ?

## 2021-07-02 NOTE — Discharge Instructions (Addendum)
You were seen in the emergency department for nausea vomiting and altered mental status in the setting of a delta 8 gummy ingestion.  You were observed for period of time and you are feeling better.  Please rest and drink plenty of fluids.  Follow-up with your doctor.  Return to the emergency department if any worsening or concerning symptoms ?

## 2021-07-03 LAB — CBC WITH DIFFERENTIAL/PLATELET
Abs Immature Granulocytes: 0.03 10*3/uL (ref 0.00–0.07)
Basophils Absolute: 0.1 10*3/uL (ref 0.0–0.1)
Basophils Relative: 1 %
Eosinophils Absolute: 0.1 10*3/uL (ref 0.0–0.5)
Eosinophils Relative: 1 %
HCT: 41.5 % (ref 36.0–46.0)
Hemoglobin: 14 g/dL (ref 12.0–15.0)
Immature Granulocytes: 0 %
Lymphocytes Relative: 22 %
Lymphs Abs: 2.7 10*3/uL (ref 0.7–4.0)
MCH: 31.4 pg (ref 26.0–34.0)
MCHC: 33.7 g/dL (ref 30.0–36.0)
MCV: 93 fL (ref 80.0–100.0)
Monocytes Absolute: 0.8 10*3/uL (ref 0.1–1.0)
Monocytes Relative: 7 %
Neutro Abs: 8.9 10*3/uL — ABNORMAL HIGH (ref 1.7–7.7)
Neutrophils Relative %: 69 %
Platelets: 260 10*3/uL (ref 150–400)
RBC: 4.46 MIL/uL (ref 3.87–5.11)
RDW: 12.4 % (ref 11.5–15.5)
WBC: 12.6 10*3/uL — ABNORMAL HIGH (ref 4.0–10.5)
nRBC: 0 % (ref 0.0–0.2)

## 2021-07-03 LAB — COMPREHENSIVE METABOLIC PANEL
ALT: 73 U/L — ABNORMAL HIGH (ref 0–44)
AST: 37 U/L (ref 15–41)
Albumin: 4.2 g/dL (ref 3.5–5.0)
Alkaline Phosphatase: 37 U/L — ABNORMAL LOW (ref 38–126)
Anion gap: 8 (ref 5–15)
BUN: 17 mg/dL (ref 6–20)
CO2: 23 mmol/L (ref 22–32)
Calcium: 8.8 mg/dL — ABNORMAL LOW (ref 8.9–10.3)
Chloride: 103 mmol/L (ref 98–111)
Creatinine, Ser: 0.64 mg/dL (ref 0.44–1.00)
GFR, Estimated: >60 mL/min (ref >60)
Glucose, Bld: 199 mg/dL — ABNORMAL HIGH (ref 70–99)
Potassium: 4.4 mmol/L (ref 3.5–5.1)
Sodium: 134 mmol/L — ABNORMAL LOW (ref 135–145)
Total Bilirubin: 0.5 mg/dL (ref 0.3–1.2)
Total Protein: 7.5 g/dL (ref 6.5–8.1)

## 2021-07-03 NOTE — ED Provider Notes (Signed)
?  Patient rested comfortably overnight without incident.  On my evaluation this morning, she is easily arousable.  States she feels better.  She is eating and drinking and has been ambulatory.  We will call her ride to come get her. ? ?Labs reviewed.  Glucose elevated but otherwise largely unremarkable. ? ?Physical Exam  ?BP 108/66   Pulse 80   Temp (!) 97.4 ?F (36.3 ?C) (Oral)   Resp 18   Ht 1.626 m (5' 4" )   Wt 111 kg   LMP 07/14/2010 Comment: ovaries removed 2012  SpO2 91%   BMI 42.00 kg/m?  ? ?Physical Exam ? ?Procedures  ?Procedures ? ?ED Course / MDM  ?  ?Medical Decision Making ?Amount and/or Complexity of Data Reviewed ?Labs: ordered. ? ?Risk ?Prescription drug management. ? ? ?Intoxication ? ? ? ? ?  ?Merryl Hacker, MD ?07/03/21 [phone] ? ?

## 2021-07-06 NOTE — Telephone Encounter (Signed)
I have sent a message to the patient with the provider last recommendation.  ?

## 2021-07-08 NOTE — Telephone Encounter (Signed)
Pt is calling in today stating she has found an asthma preventative ''airduo'' and its also more cost effective ? ? ? ?Pharmacy: ? ?[facility] in [place] ?

## 2021-07-11 ENCOUNTER — Encounter: Payer: Self-pay | Admitting: Family Medicine

## 2021-07-11 ENCOUNTER — Telehealth: Payer: Self-pay | Admitting: Pulmonary Disease

## 2021-07-11 MED ORDER — FLUTICASONE-SALMETEROL 232-14 MCG/ACT IN AEPB: 1.0000 | INHALATION_SPRAY | Freq: Two times a day (BID) | RESPIRATORY_TRACT | 6 refills | Status: DC

## 2021-07-11 NOTE — Telephone Encounter (Signed)
Called and spoke with patient.  Patient requested Airduo prescription be sent to [facility].  Patient stated she can get prescription cheaper at [facility] with her insurance. ?Airduo prescription sent to requested [facility].  Nothing further at this time. ?

## 2021-07-14 ENCOUNTER — Other Ambulatory Visit: Payer: Self-pay

## 2021-07-14 MED ORDER — METFORMIN HCL 1000 MG PO TABS
ORAL_TABLET | ORAL | 0 refills | Status: DC
Start: 2021-07-14 — End: 2022-10-25

## 2021-08-08 ENCOUNTER — Other Ambulatory Visit: Payer: Self-pay | Admitting: Family Medicine

## 2021-08-08 DIAGNOSIS — E7800 Pure hypercholesterolemia, unspecified: Secondary | ICD-10-CM

## 2021-09-26 ENCOUNTER — Telehealth: Payer: Self-pay

## 2021-09-26 ENCOUNTER — Ambulatory Visit: Payer: PRIVATE HEALTH INSURANCE | Admitting: Pulmonary Disease

## 2021-09-26 MED ORDER — AMPHETAMINE-DEXTROAMPHETAMINE 20 MG PO TABS: 20.0000 mg | ORAL_TABLET | Freq: Two times a day (BID) | ORAL | 0 refills | Status: AC

## 2021-09-26 NOTE — Telephone Encounter (Signed)
Adderall prescription sent in

## 2021-09-26 NOTE — Telephone Encounter (Signed)
Requesting:adderall Contract:10/18/20 UDS:10/18/20 Last Visit:04/27/21 Next Visit:10/03/21 Last Refill: 06/08/21 (60,0)  Please Advise

## 2021-09-26 NOTE — Addendum Note (Signed)
Addended by: Tammi Sou on: 09/26/2021 04:53 PM   Modules accepted: Orders

## 2021-09-26 NOTE — Telephone Encounter (Signed)
Patient refill request.  [name] - [place].  Patient scheduled appt for 10/03/21  amphetamine-dextroamphetamine (ADDERALL) 20 MG tablet [[id number]]

## 2021-09-28 ENCOUNTER — Other Ambulatory Visit: Payer: Self-pay | Admitting: Family Medicine

## 2021-10-03 ENCOUNTER — Ambulatory Visit: Payer: PRIVATE HEALTH INSURANCE | Admitting: Family Medicine

## 2021-10-03 NOTE — Progress Notes (Deleted)
OFFICE VISIT  10/03/2021  CC: No chief complaint on file.   Patient is a 56 y.o. female who presents for 5 mo f/u DM, HTN, and chronic anxiety.  #1 asthma, moderate persistent. Not well controlled but due to cost at this point we have no options for better control. She will call if she gets on insurance and let us know which inhaled controller medicine we could start.   #2 diabetes type 2 without complication. Metformin 1000 mg twice daily, control is good. Hemoglobin A1c and nonfasting glucose today.   #3 hypertension, well controlled on losartan 75 mg a day and Maxzide 25-30 7.5 whole tab daily. Electrolytes and creatinine today   #4 fibromyalgia. Well-controlled on Lyrica 150 mg twice a day. No new prescription was needed today.  5.  GAD, history of major depressive disorder. Doing well on citalopram 40 mg a day and Klonopin 0.5 mg 1-2 times a day. No new prescription needed today.   Essentially no changes made today."  INTERIM HX: ***   PMP AWARE reviewed today: most recent rx for clonazepam was filled 09/09/2021, #60, rx by me. Most recent prescription for Lyrica filled 08/10/2021, #60, prescription by me. No red flags.   Past Medical History:  Diagnosis Date   Allergic rhinitis    Anxiety and depression    Attention deficit disorder (ADD) in adult    Atypical chest pain    Myocardial perf imaging 04/04/16: Low risk stress nuclear study with prior inferior infarct and mild peri-infarct ischemia; EF 55 with mild hypokinesis of the inferior wall.  Cath normal, so stress test was FALSE POS.   Cholelithiasis 03/2016   u/s: no signs of cholecystitis   Fibromyalgia syndrome    GERD (gastroesophageal reflux disease)    History of breast cancer 2012   left mastectomy and chemotherapy   History of chemotherapy    2012 completed   Hyperlipidemia    Hypertension    Moderate persistent asthma    dx'd age 89 (Dr. Gwenette Greet) hx of noncompliance with controller therapy due to  cost. PFTs 01/2019->mild small airways obst dz, no resp to bronchodil. Trelegy started 02/2019 by Dr. Ander Slade. Breztri helpful as of 2021.   Morbid obesity ([place])    NASH (nonalcoholic steatohepatitis) 02/2010   u/s confirmed 02/2010 (hx of transaminitis).  Repeat u/s 03/2016 showed no sign of cirrhosis or liver mass, just moderate hepatic steatosis. AST/ALT stable 06/2019   Neuropathy of both upper extremities    and both feet   OSA (obstructive sleep apnea)    Dr Gwenette Greet initially: pt couldn't tolerate CPAP.  Re-eval 12/2017 re-confirmed severe OSA, CPAP at 11 recommended.   Ovarian mass    resected 07/12/10   RLS (restless legs syndrome)    Type II or unspecified type diabetes mellitus without mention of complication, uncontrolled 05/15/2012    Past Surgical History:  Procedure Laterality Date   BILATERAL SALPINGOOPHORECTOMY  07/12/2010   enlarged ovary   CARDIOVASCULAR STRESS TEST  04/04/2016   Low risk stress nuclear study with prior inferior infarct and mild peri-infarct ischemia; EF 55% with mild hypokinesis of the inferior wall.  F/u cath was NORMAL, so this is suspected to be a false pos stress test (diaphragmatic attenuation)   G 2 P 2     LEFT HEART CATH AND CORONARY ANGIOGRAPHY N/A 04/20/2016   Normal coronaries.  EF 55-65%, normal LV function. (Stress test prior to this was false pos due to diaphragmatic attenuation). Procedure: Left Heart Cath and Coronary  Angiography;  Surgeon: Peter M Martinique, MD;  Location: [facility];  Service: Cardiovascular;  Laterality: N/A;   MASTECTOMY  06/21/2010   Left ; Dr [facility]    Outpatient Medications Prior to Visit  Medication Sig Dispense Refill   albuterol (PROVENTIL) (2.5 MG/3ML) 0.083% nebulizer solution USE THREE MILLILITERS VIA NEBULIZATION BY MOUTH EVERY 6 HOURS AS NEEDED FOR WHEEZING OR FOR SHORTNESS OF BREATH 75 mL 5   albuterol (VENTOLIN HFA) 108 (90 Base) MCG/ACT inhaler INHALE ONE PUFF BY MOUTH EVERY 6 HOURS AS NEEDED  FOR WHEEZING OR FOR SHORTNESS OF BREATH 18 g 1   amphetamine-dextroamphetamine (ADDERALL) 20 MG tablet Take 1 tablet (20 mg total) by mouth 2 (two) times daily. 60 tablet 0   atorvastatin (LIPITOR) 80 MG tablet TAKE ONE TABLET BY MOUTH DAILY 30 tablet 5   cetirizine (ZYRTEC) 10 MG tablet Take 10 mg by mouth daily.     citalopram (CELEXA) 40 MG tablet TAKE ONE TABLET BY MOUTH DAILY 90 tablet 0   clonazePAM (KLONOPIN) 0.5 MG tablet Take 1 tablet (0.5 mg total) by mouth 2 (two) times daily as needed. 60 tablet 5   fluticasone (CUTIVATE) 0.05 % cream Apply topically 2 (two) times daily. 60 g 1   Fluticasone-Salmeterol (AIRDUO RESPICLICK 412/87) 867-67 MCG/ACT AEPB Inhale 1 puff into the lungs every 12 (twelve) hours. 1 each 6   ibuprofen (ADVIL,MOTRIN) 200 MG tablet Take 800 mg by mouth as needed for fever or moderate pain.      irbesartan (AVAPRO) 75 MG tablet Take 1 tablet (75 mg total) by mouth daily. 30 tablet 1   MAGNESIUM OXIDE PO Take 800 mg by mouth daily.     metFORMIN (GLUCOPHAGE) 1000 MG tablet TAKE ONE TABLET BY MOUTH TWICE A DAY WITH MEAL 180 tablet 0   Multiple Vitamin (MULTIVITAMIN) tablet Take 1 tablet by mouth daily.     ondansetron (ZOFRAN) 4 MG tablet Take 1 tablet (4 mg total) by mouth every 8 (eight) hours as needed for nausea or vomiting. 15 tablet 0   pregabalin (LYRICA) 150 MG capsule Take 1 capsule (150 mg total) by mouth 2 (two) times daily. 60 capsule 5   rOPINIRole (REQUIP) 0.5 MG tablet TAKE ONE TABLET BY MOUTH DAILY 90 tablet 3   triamterene-hydrochlorothiazide (MAXZIDE-25) 37.5-25 MG tablet Take 1 tablet by mouth daily. 90 tablet 3   No facility-administered medications prior to visit.    Allergies  Allergen Reactions   Metoprolol Other (See Comments)    REACTION: " chest pain"   Sulfa Antibiotics Nausea And Vomiting   Verapamil Hcl Er Hypertension    Increased HR, blood pressure Makes breathing worse    ROS As per HPI  PE:    07/03/2021    6:01 AM  07/03/2021    2:30 AM 07/03/2021    1:45 AM  Vitals with BMI  Systolic [phone]  Diastolic 72 66 63  Pulse 77 80 90     Physical Exam  ***  LABS:  Last CBC Lab Results  Component Value Date   WBC 12.6 (H) 07/02/2021   HGB 14.0 07/02/2021   HCT 41.5 07/02/2021   MCV 93.0 07/02/2021   MCH 31.4 07/02/2021   RDW 12.4 07/02/2021   PLT 260 83/66/2947   Last metabolic panel Lab Results  Component Value Date   GLUCOSE 199 (H) 07/02/2021   NA 134 (L) 07/02/2021   K 4.4 07/02/2021   CL 103 07/02/2021   CO2 23  07/02/2021   BUN 17 07/02/2021   CREATININE 0.64 07/02/2021   GFRNONAA >60 07/02/2021   CALCIUM 8.8 (L) 07/02/2021   PROT 7.5 07/02/2021   ALBUMIN 4.2 07/02/2021   BILITOT 0.5 07/02/2021   ALKPHOS 37 (L) 07/02/2021   AST 37 07/02/2021   ALT 73 (H) 07/02/2021   ANIONGAP 8 07/02/2021   Last lipids Lab Results  Component Value Date   CHOL 117 10/18/2020   HDL 29.50 (L) 10/18/2020   LDLCALC 47 12/09/2018   LDLDIRECT 57.0 10/18/2020   TRIG 286.0 (H) 10/18/2020   CHOLHDL 4 10/18/2020   Last hemoglobin A1c Lab Results  Component Value Date   HGBA1C 6.5 04/27/2021   Last thyroid functions Lab Results  Component Value Date   TSH 2.37 12/09/2018   IMPRESSION AND PLAN:  No problem-specific Assessment & Plan notes found for this encounter.   An After Visit Summary was printed and given to the patient.  FOLLOW UP: No follow-ups on file.  Signed:  Crissie Sickles, MD           10/03/2021

## 2021-10-10 ENCOUNTER — Encounter: Payer: Self-pay | Admitting: Family Medicine

## 2021-10-19 ENCOUNTER — Ambulatory Visit (INDEPENDENT_AMBULATORY_CARE_PROVIDER_SITE_OTHER): Payer: PRIVATE HEALTH INSURANCE | Admitting: Nurse Practitioner

## 2021-10-19 ENCOUNTER — Encounter: Payer: Self-pay | Admitting: Nurse Practitioner

## 2021-10-19 ENCOUNTER — Telehealth: Payer: Self-pay | Admitting: Pulmonary Disease

## 2021-10-19 ENCOUNTER — Ambulatory Visit (INDEPENDENT_AMBULATORY_CARE_PROVIDER_SITE_OTHER): Payer: PRIVATE HEALTH INSURANCE

## 2021-10-19 VITALS — BP 122/80 | HR 93 | Temp 98.1°F | Ht 63.0 in | Wt 243.0 lb

## 2021-10-19 DIAGNOSIS — J4540 Moderate persistent asthma, uncomplicated: Secondary | ICD-10-CM | POA: Diagnosis not present

## 2021-10-19 DIAGNOSIS — Z720 Tobacco use: Secondary | ICD-10-CM

## 2021-10-19 DIAGNOSIS — R0609 Other forms of dyspnea: Secondary | ICD-10-CM

## 2021-10-19 DIAGNOSIS — J4530 Mild persistent asthma, uncomplicated: Secondary | ICD-10-CM

## 2021-10-19 DIAGNOSIS — G4733 Obstructive sleep apnea (adult) (pediatric): Secondary | ICD-10-CM

## 2021-10-19 DIAGNOSIS — F17200 Nicotine dependence, unspecified, uncomplicated: Secondary | ICD-10-CM

## 2021-10-19 LAB — CBC WITH DIFFERENTIAL/PLATELET
Basophils Absolute: 0.1 10*3/uL (ref 0.0–0.1)
Basophils Relative: 1.1 % (ref 0.0–3.0)
Eosinophils Absolute: 0.4 10*3/uL (ref 0.0–0.7)
Eosinophils Relative: 4 % (ref 0.0–5.0)
HCT: 44.4 % (ref 36.0–46.0)
Hemoglobin: 15 g/dL (ref 12.0–15.0)
Lymphocytes Relative: 41.8 % (ref 12.0–46.0)
Lymphs Abs: 3.8 10*3/uL (ref 0.7–4.0)
MCHC: 33.8 g/dL (ref 30.0–36.0)
MCV: 91.7 fl (ref 78.0–100.0)
Monocytes Absolute: 0.8 10*3/uL (ref 0.1–1.0)
Monocytes Relative: 8.8 % (ref 3.0–12.0)
Neutro Abs: 4 10*3/uL (ref 1.4–7.7)
Neutrophils Relative %: 44.3 % (ref 43.0–77.0)
Platelets: 317 10*3/uL (ref 150.0–400.0)
RBC: 4.84 Mil/uL (ref 3.87–5.11)
RDW: 12.7 % (ref 11.5–15.5)
WBC: 9 10*3/uL (ref 4.0–10.5)

## 2021-10-19 LAB — NITRIC OXIDE: Nitric Oxide: 23

## 2021-10-19 MED ORDER — BREZTRI AEROSPHERE 160-9-4.8 MCG/ACT IN AERO: 2.0000 | INHALATION_SPRAY | Freq: Two times a day (BID) | RESPIRATORY_TRACT | 0 refills | Status: DC

## 2021-10-19 NOTE — Addendum Note (Signed)
Addended by: Dessie Coma on: 10/19/2021 02:48 PM   Modules accepted: Orders

## 2021-10-19 NOTE — Assessment & Plan Note (Signed)
Appears to be poorly controlled with worsening dyspnea upon exertion and chest tightness brought on by emotional stressors. FeNO nl today and no evidence of bronchospasm. Does not appear to be in acute exacerbation. Arlyce Harman without significant obstruction and relatively unchanged when compared to previous PFTs. Recommended trial step up to triple therapy with Judithann Sauger - provided with samples. Continue PRN albuterol. We will check eosinophils and allergen panel to assess for allergic component; may add on Singulair. CXR today to rule out underlying etiology. We discussed that symptoms could be related to anxiety as well, which she agreed with. Short term follow up.  Patient Instructions  Stop AirDuo. Trial step up to Breztri 2 puffs Twice daily. Brush tongue and rinse mouth afterwards Continue Albuterol inhaler 2 puffs or 3 mL neb every 6 hours as needed for shortness of breath or wheezing. Notify if symptoms persist despite rescue inhaler/neb use. Continue Zyrtec 1 tab daily for allergies  Labs today - CBC with diff, Allergen panel  Chest x ray today   Referred to lung cancer screening program  Follow up in 2 weeks with Dr. Ander Slade or Alanson Aly. If symptoms do not improve or worsen, please contact office for sooner follow up or seek emergency care.

## 2021-10-19 NOTE — Assessment & Plan Note (Signed)
Quit June 2023. Counseled to remain smoke free. Referred to lung cancer screening program.

## 2021-10-19 NOTE — Patient Instructions (Addendum)
Stop AirDuo. Trial step up to Breztri 2 puffs Twice daily. Brush tongue and rinse mouth afterwards Continue Albuterol inhaler 2 puffs or 3 mL neb every 6 hours as needed for shortness of breath or wheezing. Notify if symptoms persist despite rescue inhaler/neb use. Continue Zyrtec 1 tab daily for allergies  Labs today - CBC with diff, Allergen panel  Chest x ray today   Referred to lung cancer screening program  Follow up in 2 weeks with Dr. Ander Slade or Alanson Aly. If symptoms do not improve or worsen, please contact office for sooner follow up or seek emergency care.

## 2021-10-19 NOTE — Progress Notes (Signed)
@Patient  ID: [name], female    DOB: 1965-10-05, 56 y.o.   MRN: [id number]  Chief Complaint  Patient presents with   Follow-up    She was doing well with the inhaler and had started smoking again,     Referring provider: No ref. provider found  HPI: 56 year old female, former smoker followed for asthma and OSA. She is a patient of Dr. Judson Roch and last seen in office on 06/27/2021. Past medical history significant for HTN, allergic rhinitis, DM, BPPV, RLS, HLD, ADHD, obesity, hx of breast cancer stage IA.   TEST/EVENTS:  07/23/2015 CTA chest: no evidence of PE. There are subcm mediastinaly lymph nodes. There is some mild scarring in the lateral left base. No evidence of parenchymal lung edema or consolidation. Hepatic steatosis.  02/04/2019 PFT: FVC 82, FEV1 85, ratio 82, TLC 110, DLCO 91. No significant BD; did have midflow reversibility  06/27/2021 CXR 2 view: both lungs are clear   06/27/2021: OV with Dr. Ander Slade. Hx of asthma; using rescue inhaler frequently. Previously on Symbicort but not on any maintenance inhalers. Unable to tolerate DPIs. She has also had trouble with sleeping and RLS; not wearing CPAP. Progressive weight gain. HST ordered. CXR clear.   10/19/2021: Today - follow up Patient presents today for follow up. She reports that she was doing well on the AirDuo and breathing was stable up until June. She unfortunately lost one of her best friends when they unexpectedly passed away. She started smoking again due to the emotional stress and feels that after this, her breathing has not been as great. She has quit smoking since but hasn't noticed much difference. Still having trouble feeling like she can expel all the air from her lungs when she breathes out and has some chest tightness at times. Symptoms are worse at bedtime. Uses her albuterol neb every evening and feels as though this helps. She denies cough, wheezing, chest pain, orthopnea, PND, leg swelling. She  uses her AirDuo twice daily and takes zyrtec daily for allergies.   Never completed HST; states that insurance denied this. Still having daytime fatigue and snoring at night. Denies drowsy driving, sleep parasomnias, or morning headaches.   Allergies  Allergen Reactions   Metoprolol Other (See Comments)    REACTION: " chest pain"   Sulfa Antibiotics Nausea And Vomiting   Verapamil Hcl Er Hypertension    Increased HR, blood pressure Makes breathing worse    Immunization History  Administered Date(s) Administered   Hepatitis A 11/22/2005   Hepatitis B 07/16/2001   Influenza,inj,Quad PF,6+ Mos 12/10/2012, 01/11/2017, 12/13/2017, 12/09/2018, 03/11/2020   PFIZER(Purple Top)SARS-COV-2 Vaccination 06/17/2019, 07/15/2019   Pneumococcal Polysaccharide-23 12/10/2012   Td 04/17/2003   Tdap 04/21/2013   Typhoid Live 11/22/2005   Yellow Fever 11/22/2005   Zoster Recombinat (Shingrix) 10/03/2017, 12/19/2017    Past Medical History:  Diagnosis Date   Allergic rhinitis    Anxiety and depression    Attention deficit disorder (ADD) in adult    Atypical chest pain    Myocardial perf imaging 04/04/16: Low risk stress nuclear study with prior inferior infarct and mild peri-infarct ischemia; EF 55 with mild hypokinesis of the inferior wall.  Cath normal, so stress test was FALSE POS.   Cholelithiasis 03/2016   u/s: no signs of cholecystitis   Fibromyalgia syndrome    GERD (gastroesophageal reflux disease)    History of breast cancer 2012   left mastectomy and chemotherapy   History of chemotherapy  2012 completed   Hyperlipidemia    Hypertension    Moderate persistent asthma    dx'd age 53 (Dr. Gwenette Greet) hx of noncompliance with controller therapy due to cost. PFTs 01/2019->mild small airways obst dz, no resp to bronchodil. Trelegy started 02/2019 by Dr. Ander Slade. Breztri helpful as of 2021.   Morbid obesity ([place])    NASH (nonalcoholic steatohepatitis) 02/2010   u/s confirmed 02/2010 (hx of  transaminitis).  Repeat u/s 03/2016 showed no sign of cirrhosis or liver mass, just moderate hepatic steatosis. AST/ALT stable 06/2019   Neuropathy of both upper extremities    and both feet   OSA (obstructive sleep apnea)    Dr Gwenette Greet initially: pt couldn't tolerate CPAP.  Re-eval 12/2017 re-confirmed severe OSA, CPAP at 11 recommended.   Ovarian mass    resected 07/12/10   RLS (restless legs syndrome)    Type II or unspecified type diabetes mellitus without mention of complication, uncontrolled 05/15/2012    Tobacco History: Social History   Tobacco Use  Smoking Status Former   Packs/day: 0.50   Years: 24.00   Total pack years: 12.00   Types: Cigarettes   Quit date: 08/16/2018   Years since quitting: 3.1   Passive exposure: Never  Smokeless Tobacco Never   Counseling given: Not Answered   Outpatient Medications Prior to Visit  Medication Sig Dispense Refill   albuterol (PROVENTIL) (2.5 MG/3ML) 0.083% nebulizer solution USE THREE MILLILITERS VIA NEBULIZATION BY MOUTH EVERY 6 HOURS AS NEEDED FOR WHEEZING OR FOR SHORTNESS OF BREATH 75 mL 5   albuterol (VENTOLIN HFA) 108 (90 Base) MCG/ACT inhaler INHALE ONE PUFF BY MOUTH EVERY 6 HOURS AS NEEDED FOR WHEEZING OR FOR SHORTNESS OF BREATH 18 g 1   amphetamine-dextroamphetamine (ADDERALL) 20 MG tablet Take 1 tablet (20 mg total) by mouth 2 (two) times daily. 60 tablet 0   atorvastatin (LIPITOR) 80 MG tablet TAKE ONE TABLET BY MOUTH DAILY 30 tablet 5   cetirizine (ZYRTEC) 10 MG tablet Take 10 mg by mouth daily.     citalopram (CELEXA) 40 MG tablet TAKE ONE TABLET BY MOUTH DAILY 90 tablet 0   clonazePAM (KLONOPIN) 0.5 MG tablet Take 1 tablet (0.5 mg total) by mouth 2 (two) times daily as needed. 60 tablet 5   fluticasone (CUTIVATE) 0.05 % cream Apply topically 2 (two) times daily. 60 g 1   ibuprofen (ADVIL,MOTRIN) 200 MG tablet Take 800 mg by mouth as needed for fever or moderate pain.      irbesartan (AVAPRO) 75 MG tablet Take 1 tablet (75 mg  total) by mouth daily. 30 tablet 1   MAGNESIUM OXIDE PO Take 800 mg by mouth daily.     metFORMIN (GLUCOPHAGE) 1000 MG tablet TAKE ONE TABLET BY MOUTH TWICE A DAY WITH MEAL 180 tablet 0   Multiple Vitamin (MULTIVITAMIN) tablet Take 1 tablet by mouth daily.     ondansetron (ZOFRAN) 4 MG tablet Take 1 tablet (4 mg total) by mouth every 8 (eight) hours as needed for nausea or vomiting. 15 tablet 0   pregabalin (LYRICA) 150 MG capsule Take 1 capsule (150 mg total) by mouth 2 (two) times daily. 60 capsule 5   rOPINIRole (REQUIP) 0.5 MG tablet TAKE ONE TABLET BY MOUTH DAILY 90 tablet 3   triamterene-hydrochlorothiazide (MAXZIDE-25) 37.5-25 MG tablet Take 1 tablet by mouth daily. 90 tablet 3   Fluticasone-Salmeterol (AIRDUO RESPICLICK 601/09) 323-55 MCG/ACT AEPB Inhale 1 puff into the lungs every 12 (twelve) hours. 1 each 6   No  facility-administered medications prior to visit.     Review of Systems:   Constitutional: No weight loss or gain, night sweats, fevers, chills, or lassitude. +excessive daytime fatigue (unchanged) HEENT: No headaches, difficulty swallowing, tooth/dental problems, or sore throat. No sneezing, itching, ear ache, nasal congestion, or post nasal drip CV:  No chest pain, orthopnea, PND, swelling in lower extremities, anasarca, dizziness, palpitations, syncope Resp: +shortness of breath with exertion; chest tightness; snoring. No excess mucus or change in color of mucus. No productive or non-productive. No hemoptysis. No wheezing.  No chest wall deformity GI:  No heartburn, indigestion, abdominal pain, nausea, vomiting, diarrhea, change in bowel habits, loss of appetite, bloody stools.  Skin: No rash, lesions, ulcerations MSK:  No joint pain or swelling.  No decreased range of motion.  No back pain. Neuro: No dizziness or lightheadedness.  Psych: No depression or anxiety. Mood stable.     Physical Exam:  BP 122/80 (BP Location: Left Arm, Cuff Size: Large)   Pulse 93    Temp 98.1 F (36.7 C) (Oral)   Ht 5' 3"  (1.6 m)   Wt 243 lb (110.2 kg)   LMP 07/14/2010 Comment: ovaries removed 2012  SpO2 97%   BMI 43.05 kg/m   GEN: Pleasant, interactive, well-appearing; obese; in no acute distress. HEENT:  Normocephalic and atraumatic. PERRLA. Sclera white. Nasal turbinates pink, moist and patent bilaterally. No rhinorrhea present. Oropharynx pink and moist, without exudate or edema. No lesions, ulcerations, or postnasal drip.  NECK:  Supple w/ fair ROM. No JVD present. Normal carotid impulses w/o bruits. Thyroid symmetrical with no goiter or nodules palpated. No lymphadenopathy.   CV: RRR, no m/r/g, no peripheral edema. Pulses intact, +2 bilaterally. No cyanosis, pallor or clubbing. PULMONARY:  Unlabored, regular breathing. Clear bilaterally A&P w/o wheezes/rales/rhonchi. No accessory muscle use. No dullness to percussion. GI: BS present and normoactive. Soft, non-tender to palpation. No organomegaly or masses detected. No CVA tenderness. MSK: No erythema, warmth or tenderness. Cap refil <2 sec all extrem. No deformities or joint swelling noted.  Neuro: A/Ox3. No focal deficits noted.   Skin: Warm, no lesions or rashe Psych: Normal affect and behavior. Judgement and thought content appropriate.     Lab Results:  CBC    Component Value Date/Time   WBC 12.6 (H) 07/02/2021 2337   RBC 4.46 07/02/2021 2337   HGB 14.0 07/02/2021 2337   HGB 14.0 01/15/2015 0911   HCT 41.5 07/02/2021 2337   HCT 40.2 01/15/2015 0911   PLT 260 07/02/2021 2337   PLT 277 01/15/2015 0911   MCV 93.0 07/02/2021 2337   MCV 91.3 01/15/2015 0911   MCH 31.4 07/02/2021 2337   MCHC 33.7 07/02/2021 2337   RDW 12.4 07/02/2021 2337   RDW 12.8 01/15/2015 0911   LYMPHSABS 2.7 07/02/2021 2337   LYMPHSABS 3.0 01/15/2015 0911   MONOABS 0.8 07/02/2021 2337   MONOABS 0.6 01/15/2015 0911   EOSABS 0.1 07/02/2021 2337   EOSABS 0.3 01/15/2015 0911   BASOSABS 0.1 07/02/2021 2337   BASOSABS 0.1  01/15/2015 0911    BMET    Component Value Date/Time   NA 134 (L) 07/02/2021 2337   NA 141 01/15/2015 0911   K 4.4 07/02/2021 2337   K 3.6 01/15/2015 0911   CL 103 07/02/2021 2337   CO2 23 07/02/2021 2337   CO2 27 01/15/2015 0911   GLUCOSE 199 (H) 07/02/2021 2337   GLUCOSE 153 (H) 01/15/2015 0911   BUN 17 07/02/2021 2337   BUN 17.2  01/15/2015 0911   CREATININE 0.64 07/02/2021 2337   CREATININE 0.68 06/20/2019 1341   CREATININE 0.8 01/15/2015 0911   CALCIUM 8.8 (L) 07/02/2021 2337   CALCIUM 10.7 (H) 01/15/2015 0911   GFRNONAA >60 07/02/2021 2337   GFRAA >60 11/07/2018 2342    BNP    Component Value Date/Time   BNP 9.0 07/23/2015 0928     Imaging:  DG Chest 2 View  Result Date: 10/19/2021 CLINICAL DATA:  Shortness of breath. EXAM: CHEST - 2 VIEW COMPARISON:  06/27/2021 FINDINGS: The cardiac silhouette, mediastinal and hilar contours are within normal limits and stable. Chronic bronchitic type lung changes again demonstrated but no acute pulmonary process. No pleural effusions or pulmonary lesions. No pneumothorax. The bony thorax is intact. IMPRESSION: Chronic lung changes but no acute pulmonary findings. Electronically Signed   By: Marijo Sanes M.D.   On: 10/19/2021 10:28         Latest Ref Rng & Units 02/04/2019    2:02 PM  PFT Results  FVC-Pre L 2.99   FVC-Predicted Pre % 82   FVC-Post L 3.10   FVC-Predicted Post % 85   Pre FEV1/FVC % % 81   Post FEV1/FCV % % 82   FEV1-Pre L 2.43   FEV1-Predicted Pre % 85   FEV1-Post L 2.53   DLCO uncorrected ml/min/mmHg 19.69   DLCO UNC% % 91   DLVA Predicted % 92   TLC L 5.74   TLC % Predicted % 110   RV % Predicted % 139     Lab Results  Component Value Date   NITRICOXIDE 23 10/19/2021        Assessment & Plan:   Asthma, mild persistent Appears to be poorly controlled with worsening dyspnea upon exertion and chest tightness brought on by emotional stressors. FeNO nl today and no evidence of bronchospasm.  Does not appear to be in acute exacerbation. Arlyce Harman without significant obstruction and relatively unchanged when compared to previous PFTs. Recommended trial step up to triple therapy with Judithann Sauger - provided with samples. Continue PRN albuterol. We will check eosinophils and allergen panel to assess for allergic component; may add on Singulair. CXR today to rule out underlying etiology. We discussed that symptoms could be related to anxiety as well, which she agreed with. Short term follow up.  Patient Instructions  Stop AirDuo. Trial step up to Breztri 2 puffs Twice daily. Brush tongue and rinse mouth afterwards Continue Albuterol inhaler 2 puffs or 3 mL neb every 6 hours as needed for shortness of breath or wheezing. Notify if symptoms persist despite rescue inhaler/neb use. Continue Zyrtec 1 tab daily for allergies  Labs today - CBC with diff, Allergen panel  Chest x ray today   Referred to lung cancer screening program  Follow up in 2 weeks with Dr. Ander Slade or Alanson Aly. If symptoms do not improve or worsen, please contact office for sooner follow up or seek emergency care.     SMOKER Quit June 2023. Counseled to remain smoke free. Referred to lung cancer screening program.   Obstructive sleep apnea Previously diagnosed with OSA. Has been off CPAP for sometime. Having issues with daytime fatigue and RLS. HST previously ordered but never completed. We will follow up with her insurance company and determine if in lab study required.    I spent 35 minutes of dedicated to the care of this patient on the date of this encounter to include pre-visit review of records, face-to-face time with the patient discussing  conditions above, post visit ordering of testing, clinical documentation with the electronic health record, making appropriate referrals as documented, and communicating necessary findings to members of the patients care team.  Clayton Bibles, NP 10/19/2021  Pt aware and  understands NP's role.

## 2021-10-19 NOTE — Telephone Encounter (Signed)
The patient was in the office this morning and wanted to see if her insurance would cover an in lab sleep study. Her CPAP is broken and she wants to see about getting a new CPAP. Please advise.

## 2021-10-19 NOTE — Assessment & Plan Note (Signed)
Previously diagnosed with OSA. Has been off CPAP for sometime. Having issues with daytime fatigue and RLS. HST previously ordered but never completed. We will follow up with her insurance company and determine if in lab study required.

## 2021-10-23 LAB — ALLERGEN PANEL (27) + IGE
Alternaria Alternata IgE: 4.17 kU/L — AB
Aspergillus Fumigatus IgE: 0.23 kU/L — AB
Bahia Grass IgE: 4.42 kU/L — AB
Bermuda Grass IgE: 3.88 kU/L — AB
Cat Dander IgE: 18.5 kU/L — AB
Cedar, Mountain IgE: 0.11 kU/L — AB
Cladosporium Herbarum IgE: 0.54 kU/L — AB
Cocklebur IgE: 0.13 kU/L — AB
Cockroach, American IgE: <0.1 kU/L
Common Silver Birch IgE: 3.89 kU/L — AB
D Farinae IgE: 0.18 kU/L — AB
D Pteronyssinus IgE: 0.29 kU/L — AB
Dog Dander IgE: 8 kU/L — AB
Elm, American IgE: 0.12 kU/L — AB
Hickory, White IgE: 1.68 kU/L — AB
IgE (Immunoglobulin E), Serum: 399 IU/mL (ref 6–495)
Johnson Grass IgE: 2.76 kU/L — AB
Kentucky Bluegrass IgE: 23.6 kU/L — AB
Maple/Box Elder IgE: <0.1 kU/L
Mucor Racemosus IgE: <0.1 kU/L
Oak, White IgE: 4.36 kU/L — AB
Penicillium Chrysogen IgE: 0.11 kU/L — AB
Pigweed, Rough IgE: <0.1 kU/L
Plantain, English IgE: 3.63 kU/L — AB
Ragweed, Short IgE: 0.86 kU/L — AB
Setomelanomma Rostrat: 2.09 kU/L — AB
Timothy Grass IgE: 18.3 kU/L — AB
White Mulberry IgE: <0.1 kU/L

## 2021-10-24 ENCOUNTER — Other Ambulatory Visit: Payer: Self-pay | Admitting: Nurse Practitioner

## 2021-10-24 DIAGNOSIS — J3089 Other allergic rhinitis: Secondary | ICD-10-CM

## 2021-10-24 MED ORDER — MONTELUKAST SODIUM 10 MG PO TABS: 10.0000 mg | ORAL_TABLET | Freq: Every day | ORAL | 5 refills | Status: DC

## 2021-10-24 NOTE — Progress Notes (Signed)
Please notify patient that her allergen panel resulted and she is allergic to most of the common environmental triggers. I am going to add on singulair 10 mg At bedtime to target allergies/asthma. Please advise her to monitor her mood and notify if she develops any changes such as depression or anxiety. I am also going to send her to an allergist. Thanks!

## 2021-11-14 NOTE — Progress Notes (Signed)
New Patient Note  RE: [name] MRN: [id number] DOB: 1965/08/08 Date of Office Visit: 11/15/2021  Consult requested by: Clayton Bibles, NP Primary care provider: Laurin Coder, MD  Chief Complaint: Allergy Testing (Had bloodwork done to see what she is allergic to by the pulmonary. She thinks it is environmentals. Had bloodwork done about 2 weeks ago. ), Shortness of Breath (Has SOB 24 hours a day. Had smoked before she knew she had asthma. Had quit smoking in 2020 and had trouble breathing after that. She does get chest tightness. Does not have trouble breathing in but she has trouble breathing out. ), and Other (Has trouble sleeping since she cannot breathe )  History of Present Illness: I had the pleasure of seeing [name] for initial evaluation at the [facility] on 11/15/2021. She is a 56 y.o. female, who is referred here by Marland Kitchen, NP (pulmonology) for the evaluation of environmental allergies.  She reports symptoms of difficulty exhaling, sneezing, rhinorrhea, nasal congestion, watery eyes, itchy throat. Symptoms have been going on for many years. The symptoms are present all year around. Anosmia: no. Headache: not sure. She has used zyrtec, Flonase with some improvement in symptoms. Sinus infections: denies. Previous work up includes: 10/19/2021 bloodwork positive to cat, dog, grass, mold, trees, ragweed, weed.  Borderline to dust mites. No prior AIT. Previous ENT evaluation: denies. Previous sinus imaging: no. History of nasal polyps: no. Last eye exam: 6 months ago. History of reflux: sometimes - takes tums prn.  Breathing She reports symptoms of shortness of breath for 2 years. Current medications include Breztri 2 puffs BID x 2 weeks which help. She reports not using aerochamber with inhalers. She tried the following inhalers: Airduo, albuterol neb. Main triggers are smoke. In the last month, frequency of symptoms: depends.  Frequency of nocturnal symptoms: 0x/month. Frequency of SABA use: albuterol nebulizer daily at night. Interference with physical activity: no. Sleep is undisturbed. In the last 12 months, emergency room visits/urgent care visits/doctor office visits or hospitalizations due to respiratory issues: no. In the last 12 months, oral steroids courses: once. Lifetime history of hospitalization for respiratory issues: no. Prior intubations: no. History of pneumonia: in 63s. She was evaluated by pulmonologist in the past. Smoking exposure: stopped in 2020, 1/2 pack to 1 pack per day x 30 yrs. Up to date with flu vaccine: no. Up to date with COVID-19 vaccine: yes. Prior Covid-19 infection: no.  10/19/2021 pulmonology visit: "Asthma, mild persistent Appears to be poorly controlled with worsening dyspnea upon exertion and chest tightness brought on by emotional stressors. FeNO nl today and no evidence of bronchospasm. Does not appear to be in acute exacerbation. [name] without significant obstruction and relatively unchanged when compared to previous PFTs. Recommended trial step up to triple therapy with [name] - provided with samples. Continue PRN albuterol. We will check eosinophils and allergen panel to assess for allergic component; may add on Singulair. CXR today to rule out underlying etiology. We discussed that symptoms could be related to anxiety as well, which she agreed with. Short term follow up.   Patient Instructions  Stop AirDuo. Trial step up to Breztri 2 puffs Twice daily. Brush tongue and rinse mouth afterwards Continue Albuterol inhaler 2 puffs or 3 mL neb every 6 hours as needed for shortness of breath or wheezing. Notify if symptoms persist despite rescue inhaler/neb use. Continue Zyrtec 1 tab daily for allergies   Labs today - CBC with diff,  Allergen panel  Chest x ray today    Referred to lung cancer screening program   Follow up in 2 weeks with Dr. Ander Slade or Alanson Aly. If symptoms do  not improve or worsen, please contact office for sooner follow up or seek emergency care.     SMOKER Quit June 2023. Counseled to remain smoke free. Referred to lung cancer screening program.    Obstructive sleep apnea Previously diagnosed with OSA. Has been off CPAP for sometime. Having issues with daytime fatigue and RLS. HST previously ordered but never completed. We will follow up with her insurance company and determine if in lab study required. "  Assessment and Plan: [name] is a 56 y.o. female with: Other allergic rhinitis Perennial rhinoconjunctivitis symptoms for many years.  Tried Zyrtec and plan with some benefit.  No prior AIT. 10/19/2021 bloodwork positive to cat, dog, grass, mold, trees, ragweed, weed.  Borderline to dust mites. 1 dog, 1 cat at home. Start environmental control measures as below - especially regarding the pet dander.  Discussed with patient that I'm not sure how much of the above allergens are contributing to her breathing issues. Use over the counter antihistamines such as Zyrtec (cetirizine), Claritin (loratadine), Allegra (fexofenadine), or Xyzal (levocetirizine) daily as needed. May take twice a day during allergy flares. May switch antihistamines every few months. Start Singulair (montelukast) 4m daily at night. Cautioned that in some children/adults can experience behavioral changes including hyperactivity, agitation, depression, sleep disturbances and suicidal ideations. These side effects are rare, but if you notice them you should notify me and discontinue Singulair (montelukast). Start Ryaltris (olopatadine + mometasone nasal spray combination) 1-2 sprays per nostril twice a day. Sample given. This replaces your other nasal sprays. If this works well for you, then have [facility] ship the medication to your home - prescription already sent in.  Consider allergy injections for long term control if above medications do not help the symptoms - handout given.   Will need to do a few more skin testing prior to starting.  Moderate persistent asthma without complication Noted issues with her breathing x 2 years. Follows with pulmonology. Initially Airduo was helpful but now on Breztri x 2 weeks and using albuterol neb daily at night. Ex-smoker. 2023 CXR - chronic lung changes.  Today's spirometry showed some restriction most likely due to body habitus. Follow up with pulmonology as scheduled. If symptoms not improving - discussed adding on biologics (eos 400, IgE 399). Consider ENT evaluation. See handout on heartburn control. Daily controller medication(s): continue Breztri 2 puffs twice a day with spacer and rinse mouth afterwards. Start Singulair (montelukast) 157mdaily at night. May use albuterol rescue inhaler 2 puffs or nebulizer every 4 to 6 hours as needed for shortness of breath, chest tightness, coughing, and wheezing. May use albuterol rescue inhaler 2 puffs 5 to 15 minutes prior to strenuous physical activities. Monitor frequency of use.  Only use albuterol if needed.  Return in about 2 months (around 01/15/2022).  Meds ordered this encounter  Medications   Budeson-Glycopyrrol-Formoterol (BREZTRI AEROSPHERE) 160-9-4.8 MCG/ACT AERO    Sig: Inhale 2 puffs into the lungs in the morning and at bedtime. with spacer and rinse mouth afterwards.    Dispense:  10.7 g    Refill:  3   Olopatadine-Mometasone (RYALTRIS) 665-25 MCG/ACT SUSP    Sig: Place 1-2 sprays into the nose in the morning and at bedtime.    Dispense:  29 g    Refill:  5  [id number]   Lab Orders  No laboratory test(s) ordered today    Other allergy screening: Food allergy: no Medication allergy: no Hymenoptera allergy: no Urticaria: no Eczema:no History of recurrent infections suggestive of immunodeficency: no  Diagnostics: Spirometry:  Tracings reviewed. Her effort: Good reproducible efforts. FVC: 2.80L FEV1: 2.18L, 78% predicted FEV1/FVC ratio:  78% Interpretation: Spirometry consistent with possible restrictive disease.  Please see scanned spirometry results for details.  Past Medical History: Patient Active Problem List   Diagnosis Date Noted   Moderate persistent asthma without complication 99/37/1696   Gastroesophageal reflux disease 11/15/2021   Abnormal nuclear stress test 04/12/2016   Cervical cancer screening 09/20/2015   UTI (urinary tract infection) 08/27/2015   Maxillary sinusitis, acute 06/22/2015   Lymphadenitis 06/22/2015   Infection of urinary tract 02/12/2015   Eustachian tube dysfunction 08/31/2013   BPPV (benign paroxysmal positional vertigo) 04/21/2013   Adult ADHD 04/21/2013   Cellulitis 03/07/2013   Obesity, Class III, BMI 40-49.9 (morbid obesity) ([place]) 06/24/2012   Diabetes mellitus without complication ([place]) 78/93/8101   Peripheral neuropathy 08/18/2011   Breast cancer of upper-inner quadrant of left female breast ([place]) 04/19/2011   TRANSAMINASES, SERUM, ELEVATED 05/09/2010   Hyperlipidemia, mixed 04/26/2010   SMOKER 04/26/2010   Fatty liver 02/10/2010   Essential hypertension 04/18/2007   Other allergic rhinitis 04/18/2007   Asthma, mild persistent 04/18/2007   Obstructive sleep apnea 04/10/2007   RESTLESS LEGS SYNDROME 04/10/2007   Past Medical History:  Diagnosis Date   Allergic rhinitis    Anxiety and depression    Attention deficit disorder (ADD) in adult    Atypical chest pain    Myocardial perf imaging 04/04/16: Low risk stress nuclear study with prior inferior infarct and mild peri-infarct ischemia; EF 55 with mild hypokinesis of the inferior wall.  Cath normal, so stress test was FALSE POS.   Cholelithiasis 03/2016   u/s: no signs of cholecystitis   Fibromyalgia syndrome    GERD (gastroesophageal reflux disease)    History of breast cancer 2012   left mastectomy and chemotherapy   History of chemotherapy    2012 completed   Hyperlipidemia    Hypertension    Moderate persistent  asthma    dx'd age 48 (Dr. Gwenette Greet) hx of noncompliance with controller therapy due to cost. PFTs 01/2019->mild small airways obst dz, no resp to bronchodil. Trelegy started 02/2019 by Dr. Ander Slade. Breztri helpful as of 2021.   Morbid obesity ([place])    NASH (nonalcoholic steatohepatitis) 02/2010   u/s confirmed 02/2010 (hx of transaminitis).  Repeat u/s 03/2016 showed no sign of cirrhosis or liver mass, just moderate hepatic steatosis. AST/ALT stable 06/2019   Neuropathy of both upper extremities    and both feet   OSA (obstructive sleep apnea)    Dr Gwenette Greet initially: pt couldn't tolerate CPAP.  Re-eval 12/2017 re-confirmed severe OSA, CPAP at 11 recommended.   Ovarian mass    resected 07/12/10   Recurrent upper respiratory infection (URI)    RLS (restless legs syndrome)    Type II or unspecified type diabetes mellitus without mention of complication, uncontrolled 05/15/2012   Past Surgical History: Past Surgical History:  Procedure Laterality Date   BILATERAL SALPINGOOPHORECTOMY  07/12/2010   enlarged ovary   CARDIOVASCULAR STRESS TEST  04/04/2016   Low risk stress nuclear study with prior inferior infarct and mild peri-infarct ischemia; EF 55% with mild hypokinesis of the inferior wall.  F/u cath was NORMAL, so this is suspected to be a false pos stress  test (diaphragmatic attenuation)   G 2 P 2     LEFT HEART CATH AND CORONARY ANGIOGRAPHY N/A 04/20/2016   Normal coronaries.  EF 55-65%, normal LV function. (Stress test prior to this was false pos due to diaphragmatic attenuation). Procedure: Left Heart Cath and Coronary Angiography;  Surgeon: Peter M Martinique, MD;  Location: [facility];  Service: Cardiovascular;  Laterality: N/A;   MASTECTOMY  06/21/2010   Left ; Dr [facility]   Medication List:  Current Outpatient Medications  Medication Sig Dispense Refill   albuterol (PROVENTIL) (2.5 MG/3ML) 0.083% nebulizer solution USE THREE MILLILITERS VIA NEBULIZATION BY MOUTH EVERY 6  HOURS AS NEEDED FOR WHEEZING OR FOR SHORTNESS OF BREATH 75 mL 5   albuterol (VENTOLIN HFA) 108 (90 Base) MCG/ACT inhaler INHALE ONE PUFF BY MOUTH EVERY 6 HOURS AS NEEDED FOR WHEEZING OR FOR SHORTNESS OF BREATH 18 g 1   amphetamine-dextroamphetamine (ADDERALL) 20 MG tablet Take 1 tablet (20 mg total) by mouth 2 (two) times daily. 60 tablet 0   atorvastatin (LIPITOR) 80 MG tablet TAKE ONE TABLET BY MOUTH DAILY 30 tablet 5   Budeson-Glycopyrrol-Formoterol (BREZTRI AEROSPHERE) 160-9-4.8 MCG/ACT AERO Inhale 2 puffs into the lungs in the morning and at bedtime. with spacer and rinse mouth afterwards. 10.7 g 3   cetirizine (ZYRTEC) 10 MG tablet Take 10 mg by mouth daily.     citalopram (CELEXA) 40 MG tablet TAKE ONE TABLET BY MOUTH DAILY 90 tablet 0   clonazePAM (KLONOPIN) 0.5 MG tablet Take 1 tablet (0.5 mg total) by mouth 2 (two) times daily as needed. 60 tablet 5   fluticasone (CUTIVATE) 0.05 % cream Apply topically 2 (two) times daily. 60 g 1   ibuprofen (ADVIL,MOTRIN) 200 MG tablet Take 800 mg by mouth as needed for fever or moderate pain.      MAGNESIUM OXIDE PO Take 800 mg by mouth daily.     metFORMIN (GLUCOPHAGE) 1000 MG tablet TAKE ONE TABLET BY MOUTH TWICE A DAY WITH MEAL 180 tablet 0   Olopatadine-Mometasone (RYALTRIS) 665-25 MCG/ACT SUSP Place 1-2 sprays into the nose in the morning and at bedtime. 29 g 5   pregabalin (LYRICA) 150 MG capsule Take 1 capsule (150 mg total) by mouth 2 (two) times daily. 60 capsule 5   rOPINIRole (REQUIP) 0.5 MG tablet TAKE ONE TABLET BY MOUTH DAILY 90 tablet 3   triamterene-hydrochlorothiazide (MAXZIDE-25) 37.5-25 MG tablet Take 1 tablet by mouth daily. 90 tablet 3   montelukast (SINGULAIR) 10 MG tablet Take 1 tablet (10 mg total) by mouth at bedtime. (Patient not taking: Reported on 11/15/2021) 30 tablet 5   Multiple Vitamin (MULTIVITAMIN) tablet Take 1 tablet by mouth daily. (Patient not taking: Reported on 11/15/2021)     No current facility-administered  medications for this visit.   Allergies: Allergies  Allergen Reactions   Metoprolol Other (See Comments)    REACTION: " chest pain"   Sulfa Antibiotics Nausea And Vomiting   Verapamil Hcl Er Hypertension    Increased HR, blood pressure Makes breathing worse   Social History: Social History   Socioeconomic History   Marital status: Married    Spouse name: Not on file   Number of children: 2   Years of education: Not on file   Highest education level: Not on file  Occupational History   Occupation: Curator: Training and development officer j  Tobacco Use   Smoking status: Former    Packs/day: 0.50    Years: 24.00  Total pack years: 12.00    Types: Cigarettes    Quit date: 08/16/2018    Years since quitting: 3.2    Passive exposure: Never   Smokeless tobacco: Never  Vaping Use   Vaping Use: Never used  Substance and Sexual Activity   Alcohol use: No   Drug use: No   Sexual activity: Not on file  Other Topics Concern   Not on file  Social History Narrative   Not working as of 09/2017.  Previously worked as a Art therapist.   Lives with husband and two children.   2-3 caffeine drinks daily       Social Determinants of Health   Financial Resource Strain: Not on file  Food Insecurity: Not on file  Transportation Needs: Not on file  Physical Activity: Not on file  Stress: Not on file  Social Connections: Not on file   Lives in a 55 year old house. Smoking: quit in 2020 Occupation: not employed  Environmental HistoryFreight forwarder in the house: no Charity fundraiser in the family room: no Carpet in the bedroom: no Heating: electric Cooling: central Pet: yes 1 dog x 6 months, 1 cat x 1 yr  Family History: Family History  Adopted: Yes  Problem Relation Age of Onset   Healthy Daughter        57   Healthy Son        60   Allergic rhinitis Neg Hx    Angioedema Neg Hx    Asthma Neg Hx    Eczema Neg Hx    Review of Systems  Constitutional:  Negative for  appetite change, chills, fever and unexpected weight change.  HENT:  Negative for congestion and rhinorrhea.   Eyes:  Negative for itching.  Respiratory:  Positive for shortness of breath. Negative for cough, chest tightness and wheezing.   Cardiovascular:  Negative for chest pain.  Gastrointestinal:  Negative for abdominal pain.  Genitourinary:  Negative for difficulty urinating.  Skin:  Negative for rash.  Allergic/Immunologic: Positive for environmental allergies.  Neurological:  Negative for headaches.    Objective: BP 114/70   Pulse 71   Temp (!) 97.3 F (36.3 C)   Resp 16   Ht 5' 5.5" (1.664 m)   Wt 241 lb (109.3 kg)   LMP 07/14/2010 Comment: ovaries removed 2012  SpO2 95%   BMI 39.49 kg/m  Body mass index is 39.49 kg/m. Physical Exam Vitals and nursing note reviewed.  Constitutional:      Appearance: Normal appearance. She is well-developed. She is obese.  HENT:     Head: Normocephalic and atraumatic.     Right Ear: Tympanic membrane and external ear normal.     Left Ear: Tympanic membrane and external ear normal.     Nose: Nose normal.     Mouth/Throat:     Mouth: Mucous membranes are moist.     Pharynx: Oropharynx is clear.  Eyes:     Conjunctiva/sclera: Conjunctivae normal.  Cardiovascular:     Rate and Rhythm: Normal rate and regular rhythm.     Heart sounds: Normal heart sounds. No murmur heard.    No friction rub. No gallop.  Pulmonary:     Effort: Pulmonary effort is normal.     Breath sounds: Normal breath sounds. No wheezing, rhonchi or rales.  Musculoskeletal:     Cervical back: Neck supple.  Skin:    General: Skin is warm.     Findings: No rash.  Neurological:  Mental Status: She is alert and oriented to person, place, and time.  Psychiatric:        Behavior: Behavior normal.   The plan was reviewed with the patient/family, and all questions/concerned were addressed.  It was my pleasure to see [name] today and participate in her care.  Please feel free to contact me with any questions or concerns.  Sincerely,  Rexene Alberts, DO Allergy & Immunology  [facility] office: [place] office: [phone]

## 2021-11-15 ENCOUNTER — Ambulatory Visit (INDEPENDENT_AMBULATORY_CARE_PROVIDER_SITE_OTHER): Payer: No Typology Code available for payment source | Admitting: Allergy

## 2021-11-15 ENCOUNTER — Encounter: Payer: Self-pay | Admitting: Allergy

## 2021-11-15 VITALS — BP 114/70 | HR 71 | Temp 97.3°F | Resp 16 | Ht 65.5 in | Wt 241.0 lb

## 2021-11-15 DIAGNOSIS — J3089 Other allergic rhinitis: Secondary | ICD-10-CM | POA: Diagnosis not present

## 2021-11-15 DIAGNOSIS — K219 Gastro-esophageal reflux disease without esophagitis: Secondary | ICD-10-CM | POA: Diagnosis not present

## 2021-11-15 DIAGNOSIS — J4540 Moderate persistent asthma, uncomplicated: Secondary | ICD-10-CM

## 2021-11-15 MED ORDER — RYALTRIS 665-25 MCG/ACT NA SUSP: 1.0000 | Freq: Two times a day (BID) | NASAL | 5 refills | Status: AC

## 2021-11-15 MED ORDER — BREZTRI AEROSPHERE 160-9-4.8 MCG/ACT IN AERO: 2.0000 | INHALATION_SPRAY | Freq: Two times a day (BID) | RESPIRATORY_TRACT | 3 refills | Status: DC

## 2021-11-15 NOTE — Assessment & Plan Note (Addendum)
Noted issues with her breathing x 2 years. Follows with pulmonology. Initially Airduo was helpful but now on Breztri x 2 weeks and using albuterol neb daily at night. Ex-smoker. 2023 CXR - chronic lung changes.   Today's spirometry showed some restriction most likely due to body habitus. . Follow up with pulmonology as scheduled. . If symptoms not improving - discussed adding on biologics (eos 400, IgE 399). o Consider ENT evaluation. o See handout on heartburn control. . Daily controller medication(s): continue Breztri 2 puffs twice a day with spacer and rinse mouth afterwards. . Start Singulair (montelukast) 63m daily at night. . May use albuterol rescue inhaler 2 puffs or nebulizer every 4 to 6 hours as needed for shortness of breath, chest tightness, coughing, and wheezing. May use albuterol rescue inhaler 2 puffs 5 to 15 minutes prior to strenuous physical activities. Monitor frequency of use.  . Only use albuterol if needed.

## 2021-11-15 NOTE — Assessment & Plan Note (Signed)
Perennial rhinoconjunctivitis symptoms for many years.  Tried Zyrtec and plan with some benefit.  No prior AIT. 10/19/2021 bloodwork positive to cat, dog, grass, mold, trees, ragweed, weed.  Borderline to dust mites. 1 dog, 1 cat at home.  Start environmental control measures as below - especially regarding the pet dander.   Discussed with patient that I'm not sure how much of the above allergens are contributing to her breathing issues.  Use over the counter antihistamines such as Zyrtec (cetirizine), Claritin (loratadine), Allegra (fexofenadine), or Xyzal (levocetirizine) daily as needed. May take twice a day during allergy flares. May switch antihistamines every few months.  Start Singulair (montelukast) 38m daily at night.  Cautioned that in some children/adults can experience behavioral changes including hyperactivity, agitation, depression, sleep disturbances and suicidal ideations. These side effects are rare, but if you notice them you should notify me and discontinue Singulair (montelukast).  Start Ryaltris (olopatadine + mometasone nasal spray combination) 1-2 sprays per nostril twice a day. Sample given.  This replaces your other nasal sprays.  If this works well for you, then have [facility] ship the medication to your home - prescription already sent in.   Consider allergy injections for long term control if above medications do not help the symptoms - handout given.   Will need to do a few more skin testing prior to starting.

## 2021-11-15 NOTE — Patient Instructions (Addendum)
Environmental allergies 10/19/2021 bloodwork positive to cat, dog, grass, mold, trees, ragweed, weed.  Borderline to dust mites. Start environmental control measures as below - especially regarding the pet dander.  Use over the counter antihistamines such as Zyrtec (cetirizine), Claritin (loratadine), Allegra (fexofenadine), or Xyzal (levocetirizine) daily as needed. May take twice a day during allergy flares. May switch antihistamines every few months. Start Singulair (montelukast) 25m daily at night. Cautioned that in some children/adults can experience behavioral changes including hyperactivity, agitation, depression, sleep disturbances and suicidal ideations. These side effects are rare, but if you notice them you should notify me and discontinue Singulair (montelukast). Start Ryaltris (olopatadine + mometasone nasal spray combination) 1-2 sprays per nostril twice a day. Sample given. This replaces your other nasal sprays. If this works well for you, then have [facility] ship the medication to your home - prescription already sent in.  Consider allergy injections for long term control if above medications do not help the symptoms - handout given.  Will need to do a few more skin testing prior to starting.  Breathing Follow up with pulmonology as scheduled. If symptoms not improving - your bloodwork does qualify you for injectable for asthma. May need to see ENT in the future. See handout on heartburn control. Daily controller medication(s): continue Breztri 2 puffs twice a day with spacer and rinse mouth afterwards. Start Singulair (montelukast) 162mdaily at night. May use albuterol rescue inhaler 2 puffs or nebulizer every 4 to 6 hours as needed for shortness of breath, chest tightness, coughing, and wheezing. May use albuterol rescue inhaler 2 puffs 5 to 15 minutes prior to strenuous physical activities. Monitor frequency of use.  Asthma control goals:  Full participation in all desired  activities (may need albuterol before activity) Albuterol use two times or less a week on average (not counting use with activity) Cough interfering with sleep two times or less a month Oral steroids no more than once a year No hospitalizations   Follow up in 2 months or sooner if needed.    Reducing Pollen Exposure Pollen seasons: trees (spring), grass (summer) and ragweed/weeds (fall). Keep windows closed in your home and car to lower pollen exposure.  Install air conditioning in the bedroom and throughout the house if possible.  Avoid going out in dry windy days - especially early morning. Pollen counts are highest between 5 - 10 AM and on dry, hot and windy days.  Save outside activities for late afternoon or after a heavy rain, when pollen levels are lower.  Avoid mowing of grass if you have grass pollen allergy. Be aware that pollen can also be transported indoors on people and pets.  Dry your clothes in an automatic dryer rather than hanging them outside where they might collect pollen.  Rinse hair and eyes before bedtime. Mold Control Mold and fungi can grow on a variety of surfaces provided certain temperature and moisture conditions exist.  Outdoor molds grow on plants, decaying vegetation and soil. The major outdoor mold, Alternaria and Cladosporium, are found in very high numbers during hot and dry conditions. Generally, a late summer - fall peak is seen for common outdoor fungal spores. Rain will temporarily lower outdoor mold spore count, but counts rise rapidly when the rainy period ends. The most important indoor molds are Aspergillus and Penicillium. Dark, humid and poorly ventilated basements are ideal sites for mold growth. The next most common sites of mold growth are the bathroom and the kitchen. Outdoor (Seasonal) Mold Control Use  air conditioning and keep windows closed. Avoid exposure to decaying vegetation. Avoid leaf raking. Avoid grain handling. Consider wearing  a face mask if working in moldy areas.  Indoor (Perennial) Mold Control  Maintain humidity below 50%. Get rid of mold growth on hard surfaces with water, detergent and, if necessary, 5% bleach (do not mix with other cleaners). Then dry the area completely. If mold covers an area more than 10 square feet, consider hiring an indoor environmental professional. For clothing, washing with soap and water is best. If moldy items cannot be cleaned and dried, throw them away. Remove sources e.g. contaminated carpets. Repair and seal leaking roofs or pipes. Using dehumidifiers in damp basements may be helpful, but empty the water and clean units regularly to prevent mildew from forming. All rooms, especially basements, bathrooms and kitchens, require ventilation and cleaning to deter mold and mildew growth. Avoid carpeting on concrete or damp floors, and storing items in damp areas. Control of House Dust Mite Allergen Dust mite allergens are a common trigger of allergy and asthma symptoms. While they can be found throughout the house, these microscopic creatures thrive in warm, humid environments such as bedding, upholstered furniture and carpeting. Because so much time is spent in the bedroom, it is essential to reduce mite levels there.  Encase pillows, mattresses, and box springs in special allergen-proof fabric covers or airtight, zippered plastic covers.  Bedding should be washed weekly in hot water (130 F) and dried in a hot dryer. Allergen-proof covers are available for comforters and pillows that can't be regularly washed.  Wash the allergy-proof covers every few months. Minimize clutter in the bedroom. Keep pets out of the bedroom.  Keep humidity less than 50% by using a dehumidifier or air conditioning. You can buy a humidity measuring device called a hygrometer to monitor this.  If possible, replace carpets with hardwood, linoleum, or washable area rugs. If that's not possible, vacuum frequently with  a vacuum that has a HEPA filter. Remove all upholstered furniture and non-washable window drapes from the bedroom. Remove all non-washable stuffed toys from the bedroom.  Wash stuffed toys weekly. Pet Allergen Avoidance: Contrary to popular opinion, there are no "hypoallergenic" breeds of dogs or cats. That is because people are not allergic to an animal's hair, but to an allergen found in the animal's saliva, dander (dead skin flakes) or urine. Pet allergy symptoms typically occur within minutes. For some people, symptoms can build up and become most severe 8 to 12 hours after contact with the animal. People with severe allergies can experience reactions in public places if dander has been transported on the pet owners' clothing. Keeping an animal outdoors is only a partial solution, since homes with pets in the yard still have higher concentrations of animal allergens. Before getting a pet, ask your allergist to determine if you are allergic to animals. If your pet is already considered part of your family, try to minimize contact and keep the pet out of the bedroom and other rooms where you spend a great deal of time. As with dust mites, vacuum carpets often or replace carpet with a hardwood floor, tile or linoleum. High-efficiency particulate air (HEPA) cleaners can reduce allergen levels over time. While dander and saliva are the source of cat and dog allergens, urine is the source of allergens from rabbits, hamsters, mice and [place] pigs; so ask a non-allergic family member to clean the animal's cage. If you have a pet allergy, talk to your allergist about  the potential for allergy immunotherapy (allergy shots). This strategy can often provide long-term relief.

## 2021-11-15 NOTE — Telephone Encounter (Signed)
Will forward to Katie to advise on pt's message regarding her inhaler, pt states Judithann Sauger is not working.

## 2021-11-16 NOTE — Telephone Encounter (Signed)
If she felt like the AirDuo worked better, she can stop the [employer] and restart AirDuo. Please send AirDuo Respiclick 688 mcg Inhale 1 puff into the lungs every 12 (twelve) hours. Brush tongue and rinse mouth afterwards. Thanks.

## 2021-11-20 ENCOUNTER — Ambulatory Visit (HOSPITAL_BASED_OUTPATIENT_CLINIC_OR_DEPARTMENT_OTHER): Payer: No Typology Code available for payment source | Attending: Nurse Practitioner | Admitting: Pulmonary Disease

## 2021-11-20 DIAGNOSIS — G4733 Obstructive sleep apnea (adult) (pediatric): Secondary | ICD-10-CM | POA: Diagnosis not present

## 2021-11-20 DIAGNOSIS — G4700 Insomnia, unspecified: Secondary | ICD-10-CM | POA: Diagnosis not present

## 2021-11-20 DIAGNOSIS — G4761 Periodic limb movement disorder: Secondary | ICD-10-CM | POA: Diagnosis not present

## 2021-11-25 ENCOUNTER — Other Ambulatory Visit: Payer: Self-pay

## 2021-11-25 DIAGNOSIS — Z122 Encounter for screening for malignant neoplasm of respiratory organs: Secondary | ICD-10-CM

## 2021-11-25 DIAGNOSIS — Z87891 Personal history of nicotine dependence: Secondary | ICD-10-CM

## 2021-11-29 ENCOUNTER — Telehealth: Payer: Self-pay | Admitting: Pulmonary Disease

## 2021-11-29 NOTE — Telephone Encounter (Signed)
She has an OV coming up on 11/30/2021 an can go over results at the office visit.

## 2021-11-29 NOTE — Telephone Encounter (Signed)
Call patient  Sleep study result  Date of study: 11/20/2021  Impression: Sleep onset and sleep maintenance insomnia Periodic limb movement, restless legs   Recommendation: Continue to optimize weight loss efforts  Continue medications that may help periodically movement, history of restless legs  Study was negative for significant sleep apnea, study was limited because of inability to sleep  Follow-up as previously scheduled

## 2021-11-29 NOTE — Procedures (Signed)
POLYSOMNOGRAPHY  Last, First: Alexa, Hill MRN: [id number] Gender: Female Age (years): 56 Weight (lbs): 251 DOB: [birth year] BMI: 42 Primary Care: No PCP Epworth Score: 8 Referring: Clayton Bibles NP Technician: Earney Hamburg Interpreting: Laurin Coder MD Study Type: NPSG Ordered Study Type: Split Night CPAP Study date: 11/20/2021 Location: [place] CLINICAL INFORMATION [name] is a 56 year old Female and was referred to the sleep center for evaluation of G47.30 Sleep Apnea, Unspecified (780.57). Indications include N/A.   Most recent polysomnogram dated 12/23/2017 revealed an AHI of 78.7/h and RDI of 79.2/h. Most recent titration study dated 12/23/2017 revealed an AHI of 27.7/h. MEDICATIONS Patient self administered medications include: METFORMIN, ALBUTEROL, breztri, ATORVASTATIN, ryaltris. Medications administered during study include No sleep medicine administered.  SLEEP STUDY TECHNIQUE A multi-channel overnight Polysomnography study was performed. The channels recorded and monitored were central and occipital EEG, electrooculogram (EOG), submentalis EMG (chin), nasal and oral airflow, thoracic and abdominal wall motion, anterior tibialis EMG, snore microphone, electrocardiogram, and a pulse oximetry. TECHNICIAN COMMENTS Comments added by Technician: Patient had difficulty initiating sleep. Patient was restless all through the night. Comments added by Scorer: N/A SLEEP ARCHITECTURE The study was initiated at 10:48:04 PM and terminated at 4:55:10 AM. The total recorded time was 367.1 minutes. EEG confirmed total sleep time was 132.3 minutes yielding a sleep efficiency of 36.0%%. Sleep onset after lights out was 32.3 minutes with a REM latency of N/A minutes. The patient spent 3.0%% of the night in stage N1 sleep, 75.8%% in stage N2 sleep, 21.2%% in stage N3 and 0% in REM. Wake after sleep onset (WASO) was 202.5 minutes. The Arousal Index was 57.2/hour. RESPIRATORY  PARAMETERS There were a total of 8 respiratory disturbances out of which 0 were apneas ( 0 obstructive, 0 mixed, 0 central) and 8 hypopneas. The apnea/hypopnea index (AHI) was 3.6 events/hour. The central sleep apnea index was 0 events/hour. The REM AHI was N/A events/hour and NREM AHI was 3.6 events/hour. The supine AHI was 3.2 events/hour and the non supine AHI was 3.7 supine during 13.97% of sleep. Respiratory disturbances were associated with oxygen desaturation down to a nadir of 86.0% during sleep. The mean oxygen saturation during the study was 91.8%. The cumulative time under 88% oxygen saturation was 5.5 minutes.  LEG MOVEMENT DATA The total leg movements were 162 with a resulting leg movement index of 73.5/hr .Associated arousal with leg movement index was 29.0/hr.  CARDIAC DATA The underlying cardiac rhythm was most consistent with sinus rhythm. Mean heart rate during sleep was 72.4 bpm. Additional rhythm abnormalities include None.  IMPRESSIONS - Sleep onset and sleep maintenance of insomnia - No Significant Obstructive Sleep apnea(OSA), study was limited by insomnia. - EKG showed no significant cardiac abnormalities. - The patient snored with moderate snoring volume. - Periodic leg movements(PLMs) during sleep. Associated arousals were significant with an arousal index of 29.0 /hour.  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 - Avoid alcohol, sedatives and other CNS depressants that may worsen sleep apnea and disrupt normal sleep architecture. - Sleep hygiene should be reviewed to assess factors that may improve sleep quality. - Weight management and regular exercise should be initiated or continued.  [Electronically signed] 11/29/2021 06:35 AM  Sherrilyn Rist MD NPI: [id number]

## 2021-11-30 ENCOUNTER — Encounter: Payer: Self-pay | Admitting: Pulmonary Disease

## 2021-11-30 ENCOUNTER — Ambulatory Visit (INDEPENDENT_AMBULATORY_CARE_PROVIDER_SITE_OTHER): Payer: PRIVATE HEALTH INSURANCE | Admitting: Pulmonary Disease

## 2021-11-30 VITALS — BP 114/70 | HR 110 | Temp 98.1°F | Ht 65.0 in | Wt 242.0 lb

## 2021-11-30 DIAGNOSIS — Z23 Encounter for immunization: Secondary | ICD-10-CM | POA: Diagnosis not present

## 2021-11-30 DIAGNOSIS — G4719 Other hypersomnia: Secondary | ICD-10-CM | POA: Diagnosis not present

## 2021-11-30 DIAGNOSIS — J4540 Moderate persistent asthma, uncomplicated: Secondary | ICD-10-CM | POA: Diagnosis not present

## 2021-11-30 DIAGNOSIS — G4733 Obstructive sleep apnea (adult) (pediatric): Secondary | ICD-10-CM

## 2021-11-30 MED ORDER — FLUTICASONE-SALMETEROL 232-14 MCG/ACT IN AEPB: 1.0000 | INHALATION_SPRAY | Freq: Two times a day (BID) | RESPIRATORY_TRACT | 3 refills | Status: DC

## 2021-11-30 MED ORDER — ROPINIROLE HCL 1 MG PO TABS: 1.0000 mg | ORAL_TABLET | Freq: Every day | ORAL | 3 refills | Status: DC

## 2021-11-30 MED ORDER — CLONAZEPAM 0.5 MG PO TABS
0.5000 mg | ORAL_TABLET | Freq: Two times a day (BID) | ORAL | 3 refills | Status: DC | PRN
Start: 2021-11-30 — End: 2022-04-12

## 2021-11-30 NOTE — Progress Notes (Signed)
[name]    [id number]    1966-02-28  Primary Care Physician:Ramyah Pankowski, Ernesto Rutherford, MD  Referring Physician: No referring provider defined for this encounter.  Chief complaint:   Follow-up for asthma and obstructive sleep apnea  HPI: History of obstructive sleep apnea not currently on CPAP therapy  She did have a recent in lab sleep study which was limited by insomnia . Movement noted on study  History of asthma for which she was on Breztri, notes that Judithann Sauger does not seem to be helping She did better when she was on air duo The plan will be to resume AirDuo and discontinue Breztri  She still does have symptoms that is concerning for sleep disordered breathing with daytime sleepiness and tiredness, I recent sleep study was limited  Weight is stable  Requip for restless legs, she has been off the medications for a few weeks  She has had increased snoring, increased daytime sleepiness Usually goes to bed between 10 and 11 PM Does take a little bit of time to fall asleep  Tries to get up in the morning by 10  Occupation: No pertinent occupational history Exposures: No significant exposure Smoking history: Never smoker  Outpatient Encounter Medications as of 11/30/2021  Medication Sig   albuterol (PROVENTIL) (2.5 MG/3ML) 0.083% nebulizer solution USE THREE MILLILITERS VIA NEBULIZATION BY MOUTH EVERY 6 HOURS AS NEEDED FOR WHEEZING OR FOR SHORTNESS OF BREATH   albuterol (VENTOLIN HFA) 108 (90 Base) MCG/ACT inhaler INHALE ONE PUFF BY MOUTH EVERY 6 HOURS AS NEEDED FOR WHEEZING OR FOR SHORTNESS OF BREATH   amphetamine-dextroamphetamine (ADDERALL) 20 MG tablet Take 1 tablet (20 mg total) by mouth 2 (two) times daily.   atorvastatin (LIPITOR) 80 MG tablet TAKE ONE TABLET BY MOUTH DAILY   Budeson-Glycopyrrol-Formoterol (BREZTRI AEROSPHERE) 160-9-4.8 MCG/ACT AERO Inhale 2 puffs into the lungs in the morning and at bedtime. with spacer and rinse mouth afterwards.    cetirizine (ZYRTEC) 10 MG tablet Take 10 mg by mouth daily.   citalopram (CELEXA) 40 MG tablet TAKE ONE TABLET BY MOUTH DAILY   clonazePAM (KLONOPIN) 0.5 MG tablet Take 1 tablet (0.5 mg total) by mouth 2 (two) times daily as needed.   fluticasone (CUTIVATE) 0.05 % cream Apply topically 2 (two) times daily.   ibuprofen (ADVIL,MOTRIN) 200 MG tablet Take 800 mg by mouth as needed for fever or moderate pain.    MAGNESIUM OXIDE PO Take 800 mg by mouth daily.   metFORMIN (GLUCOPHAGE) 1000 MG tablet TAKE ONE TABLET BY MOUTH TWICE A DAY WITH MEAL   Olopatadine-Mometasone (RYALTRIS) 665-25 MCG/ACT SUSP Place 1-2 sprays into the nose in the morning and at bedtime.   rOPINIRole (REQUIP) 0.5 MG tablet TAKE ONE TABLET BY MOUTH DAILY   triamterene-hydrochlorothiazide (MAXZIDE-25) 37.5-25 MG tablet Take 1 tablet by mouth daily.   [DISCONTINUED] montelukast (SINGULAIR) 10 MG tablet Take 1 tablet (10 mg total) by mouth at bedtime. (Patient not taking: Reported on 11/15/2021)   [DISCONTINUED] Multiple Vitamin (MULTIVITAMIN) tablet Take 1 tablet by mouth daily. (Patient not taking: Reported on 11/15/2021)   [DISCONTINUED] pregabalin (LYRICA) 150 MG capsule Take 1 capsule (150 mg total) by mouth 2 (two) times daily. (Patient not taking: Reported on 11/30/2021)   No facility-administered encounter medications on file as of 11/30/2021.    Allergies as of 11/30/2021 - Review Complete 11/30/2021  Allergen Reaction Noted   Metoprolol Other (See Comments) 01/21/2007   Sulfa antibiotics Nausea And Vomiting 03/28/2013  Verapamil hcl er Hypertension 10/18/2020    Past Medical History:  Diagnosis Date   Allergic rhinitis    Anxiety and depression    Attention deficit disorder (ADD) in adult    Atypical chest pain    Myocardial perf imaging 04/04/16: Low risk stress nuclear study with prior inferior infarct and mild peri-infarct ischemia; EF 55 with mild hypokinesis of the inferior wall.  Cath normal, so stress test was  FALSE POS.   Cholelithiasis 03/2016   u/s: no signs of cholecystitis   Fibromyalgia syndrome    GERD (gastroesophageal reflux disease)    History of breast cancer 2012   left mastectomy and chemotherapy   History of chemotherapy    2012 completed   Hyperlipidemia    Hypertension    Moderate persistent asthma    dx'd age 67 (Dr. Gwenette Greet) hx of noncompliance with controller therapy due to cost. PFTs 01/2019->mild small airways obst dz, no resp to bronchodil. Trelegy started 02/2019 by Dr. Ander Slade. Breztri helpful as of 2021.   Morbid obesity ([place])    NASH (nonalcoholic steatohepatitis) 02/2010   u/s confirmed 02/2010 (hx of transaminitis).  Repeat u/s 03/2016 showed no sign of cirrhosis or liver mass, just moderate hepatic steatosis. AST/ALT stable 06/2019   Neuropathy of both upper extremities    and both feet   OSA (obstructive sleep apnea)    Dr Gwenette Greet initially: pt couldn't tolerate CPAP.  Re-eval 12/2017 re-confirmed severe OSA, CPAP at 11 recommended.   Ovarian mass    resected 07/12/10   Recurrent upper respiratory infection (URI)    RLS (restless legs syndrome)    Type II or unspecified type diabetes mellitus without mention of complication, uncontrolled 05/15/2012    Past Surgical History:  Procedure Laterality Date   BILATERAL SALPINGOOPHORECTOMY  07/12/2010   enlarged ovary   CARDIOVASCULAR STRESS TEST  04/04/2016   Low risk stress nuclear study with prior inferior infarct and mild peri-infarct ischemia; EF 55% with mild hypokinesis of the inferior wall.  F/u cath was NORMAL, so this is suspected to be a false pos stress test (diaphragmatic attenuation)   G 2 P 2     LEFT HEART CATH AND CORONARY ANGIOGRAPHY N/A 04/20/2016   Normal coronaries.  EF 55-65%, normal LV function. (Stress test prior to this was false pos due to diaphragmatic attenuation). Procedure: Left Heart Cath and Coronary Angiography;  Surgeon: Peter M Martinique, MD;  Location: [facility];  Service:  Cardiovascular;  Laterality: N/A;   MASTECTOMY  06/21/2010   Left ; Dr [facility]    Family History  Adopted: Yes  Problem Relation Age of Onset   Healthy Daughter        27   Healthy Son        35   Allergic rhinitis Neg Hx    Angioedema Neg Hx    Asthma Neg Hx    Eczema Neg Hx     Social History   Socioeconomic History   Marital status: Married    Spouse name: Not on file   Number of children: 2   Years of education: Not on file   Highest education level: Not on file  Occupational History   Occupation: Curator: Training and development officer j  Tobacco Use   Smoking status: Former    Packs/day: 0.50    Years: 24.00    Total pack years: 12.00    Types: Cigarettes    Quit date: 08/16/2018    Years  since quitting: 3.2    Passive exposure: Never   Smokeless tobacco: Never  Vaping Use   Vaping Use: Never used  Substance and Sexual Activity   Alcohol use: No   Drug use: No   Sexual activity: Not on file  Other Topics Concern   Not on file  Social History Narrative   Not working as of 09/2017.  Previously worked as a Art therapist.   Lives with husband and two children.   2-3 caffeine drinks daily       Social Determinants of Health   Financial Resource Strain: Not on file  Food Insecurity: Not on file  Transportation Needs: Not on file  Physical Activity: Not on file  Stress: Not on file  Social Connections: Not on file  Intimate Partner Violence: Not on file    Review of Systems  Constitutional:  Positive for fatigue.  HENT: Negative.    Respiratory:  Positive for apnea and shortness of breath.   Cardiovascular: Negative.   Gastrointestinal: Negative.   Musculoskeletal: Negative.   Psychiatric/Behavioral:  Positive for sleep disturbance.     Vitals:   11/30/21 0923  BP: 114/70  Pulse: (!) 110  Temp: 98.1 F (36.7 C)  SpO2: 96%    Physical Exam Constitutional:      Appearance: She is well-developed. She is obese.  HENT:     Head:  Normocephalic and atraumatic.  Eyes:     General:        Right eye: No discharge.        Left eye: No discharge.     Conjunctiva/sclera: Conjunctivae normal.     Pupils: Pupils are equal, round, and reactive to light.  Neck:     Thyroid: No thyromegaly.     Trachea: No tracheal deviation.  Cardiovascular:     Rate and Rhythm: Normal rate and regular rhythm.  Pulmonary:     Effort: Pulmonary effort is normal. No respiratory distress.     Breath sounds: Normal breath sounds. No stridor. No wheezing, rhonchi or rales.  Musculoskeletal:     Cervical back: Normal range of motion and neck supple.  Neurological:     Mental Status: She is alert.  Psychiatric:        Mood and Affect: Mood normal.    Most recent sleep study reviewed-limited by insomnia   Assessment:  Obstructive sleep apnea Has not been on CPAP -Most recent study limited by insomnia  Restless leg syndrome -We will resume Requip, increase dose to 1 mg nightly  Nonrestorative sleep -Likely related to untreated sleep disordered breathing  Shortness of breath/asthma exacerbation -Discontinue Breztri -Prescription for air duo placed  Morbid obesity with a BMI greater than 40  We will need a repeat sleep study at some point  Plan/Recommendations: .  Prescription for air duo in place of [facility]  .  Requip at 1 mg nightly  .  Prescription for Klonopin to be used as needed  .  Weight loss efforts encouraged  .  Encouraged to discuss with primary doctor regarding management of diabetes, meds that may help weight loss  .  Follow-up in 6 to 8 weeks  .  Flu shot today     Sherrilyn Rist MD [facility] 11/30/2021, 9:29 AM  CC: No ref. provider found

## 2021-11-30 NOTE — Patient Instructions (Signed)
I will see you in 6 to 8 weeks  We may need to repeat a sleep study at some point whenever things are more stable  Your recent sleep study showed you did not sleep much    We will increase the dose of Requip to 1 mg up from 0.5  We will get you a prescription for your Klonopin to be used as needed  Discontinue Breztri  Prescription for air duo  Graded exercises as tolerated  Call with significant concerns  Discuss with your primary doctor for consideration of medications for diabetes that may help with your weight loss

## 2021-12-02 ENCOUNTER — Other Ambulatory Visit: Payer: Self-pay | Admitting: Pulmonary Disease

## 2021-12-02 ENCOUNTER — Encounter: Payer: Self-pay | Admitting: Pulmonary Disease

## 2021-12-02 MED ORDER — AIRDUO DIGIHALER 113-14 MCG/ACT IN AEPB: 1.0000 | INHALATION_SPRAY | Freq: Two times a day (BID) | RESPIRATORY_TRACT | 5 refills | Status: DC

## 2021-12-02 NOTE — Telephone Encounter (Signed)
Lower dose Airduo sent in

## 2021-12-08 ENCOUNTER — Other Ambulatory Visit: Payer: Self-pay | Admitting: Pulmonary Disease

## 2021-12-08 ENCOUNTER — Encounter: Payer: Self-pay | Admitting: Pulmonary Disease

## 2021-12-08 MED ORDER — FLUTICASONE PROPIONATE HFA 110 MCG/ACT IN AERO: 2.0000 | INHALATION_SPRAY | Freq: Two times a day (BID) | RESPIRATORY_TRACT | 6 refills | Status: DC

## 2021-12-08 NOTE — Telephone Encounter (Signed)
Okay to try Flovent [facility]  Prescription sent to pharmacy

## 2021-12-09 ENCOUNTER — Other Ambulatory Visit: Payer: Self-pay | Admitting: Pulmonary Disease

## 2021-12-09 ENCOUNTER — Other Ambulatory Visit (HOSPITAL_COMMUNITY): Payer: Self-pay

## 2021-12-09 MED ORDER — ALVESCO 80 MCG/ACT IN AERS
1.0000 | INHALATION_SPRAY | Freq: Two times a day (BID) | RESPIRATORY_TRACT | 0 refills | Status: DC
Start: 2021-12-09 — End: 2022-01-02

## 2021-12-09 NOTE — Telephone Encounter (Signed)
Prescription for Alvesco sent into pharmacy

## 2021-12-09 NOTE — Telephone Encounter (Signed)
Test claims return the following for alternate ICS:  Arnuity $283.29 (Dry Powder) Alvesco $32.91 (Soft Mist) Asmanex $240.37 (Dry Powder) Flovent Diskus $278.02 (Dry Powder) Flovent HFA $428.46 (Meter Dose) Pulmicort Flexhaler $268.85 (Dry Powder) Qvar Redihaler $284.65 (Meter Dose)

## 2021-12-09 NOTE — Progress Notes (Signed)
Prescription switched from Flovent to Alvesco  Most affordable based on checks

## 2021-12-09 NOTE — Telephone Encounter (Signed)
Mychart message sent by pt: [name] (supporting Adewale Clearence Ped, MD) 20 hours ago (2:45 PM)    Thank you for the prescription for Flovent.  Unfortunately the lowest copay my insurance will cover that for is $200.  Will you please ask Dr Corliss Blacker if there is another inhaler like that one without powder please?  If not I guess the one that is the lowest dose or lowest powder content.  I will have to call my ins co to find out where to have it filled so if you could let me know which one to ask about.  Thank you so much!!  [name]    Routing this to both Dr. Jenetta Downer and prior auth team for review to see which inhalers are covered by pt's insurance.

## 2022-01-02 ENCOUNTER — Other Ambulatory Visit: Payer: Self-pay | Admitting: Pulmonary Disease

## 2022-01-02 ENCOUNTER — Encounter: Payer: Self-pay | Admitting: Pulmonary Disease

## 2022-01-02 MED ORDER — FLUTICASONE PROPIONATE HFA 220 MCG/ACT IN AERO: 2.0000 | INHALATION_SPRAY | Freq: Two times a day (BID) | RESPIRATORY_TRACT | 5 refills | Status: DC

## 2022-01-02 NOTE — Telephone Encounter (Signed)
Will send in a prescription for Flovent.  Theres no way I am sure it will be better, may be worth trying if what you are using is not working

## 2022-01-03 ENCOUNTER — Ambulatory Visit (INDEPENDENT_AMBULATORY_CARE_PROVIDER_SITE_OTHER): Payer: PRIVATE HEALTH INSURANCE | Admitting: Acute Care

## 2022-01-03 ENCOUNTER — Encounter: Payer: Self-pay | Admitting: Acute Care

## 2022-01-03 DIAGNOSIS — Z87891 Personal history of nicotine dependence: Secondary | ICD-10-CM

## 2022-01-03 NOTE — Progress Notes (Addendum)
Virtual Visit via Telephone Note  I connected with [name] on 01/03/22 at  1:30 PM EDT by telephone and verified that I am speaking with the correct person using two identifiers.  Location: Patient: At home  Provider: [place], [place], [place], [address]    I discussed the limitations, risks, security and privacy concerns of performing an evaluation and management service by telephone and the availability of in person appointments. I also discussed with the patient that there may be a patient responsible charge related to this service. The patient expressed understanding and agreed to proceed.   Shared Decision Making Visit Lung Cancer Screening Program [phone])   Eligibility: Age 56 y.o. Pack Years Smoking History Calculation 23 pack year smoking history (# packs/per year x # years smoked) Recent History of coughing up blood  no Unexplained weight loss? no ( >Than 15 pounds within the last 6 months ) Prior History Lung / other cancer no (Diagnosis within the last 5 years already requiring surveillance chest CT Scans). Smoking Status Former Smoker Former Smokers: Years since quit: 3 years  Quit Date:  NA  Visit Components: Discussion included one or more decision making aids. yes Discussion included risk/benefits of screening. yes Discussion included potential follow up diagnostic testing for abnormal scans. yes Discussion included meaning and risk of over diagnosis. yes Discussion included meaning and risk of False Positives. yes Discussion included meaning of total radiation exposure. yes  Counseling Included: Importance of adherence to annual lung cancer LDCT screening. yes Impact of comorbidities on ability to participate in the program. yes Ability and willingness to under diagnostic treatment. yes  Smoking Cessation Counseling: Current Smokers:  Discussed importance of smoking cessation. yes Information about tobacco cessation classes and  interventions provided to patient. yes Patient provided with "ticket" for LDCT Scan. yes Symptomatic Patient. no  Counseling NA Diagnosis Code: Tobacco Use Z72.0 Asymptomatic Patient yes  Counseling (Intermediate counseling: > three minutes counseling) P1025 Former Smokers:  Discussed the importance of maintaining cigarette abstinence. yes Diagnosis Code: Personal History of Nicotine Dependence. E52.778 Information about tobacco cessation classes and interventions provided to patient. Yes Patient provided with "ticket" for LDCT Scan. yes Written Order for Lung Cancer Screening with LDCT placed in Epic. Yes (CT Chest Lung Cancer Screening Low Dose W/O CM) [id number] Z12.2-Screening of respiratory organs Z87.891-Personal history of nicotine dependence  I spent 25 minutes of face to face time/virtual visit time  with  [name] discussing the risks and benefits of lung cancer screening. We took the time to pause the power point at intervals to allow for questions to be asked and answered to ensure understanding. We discussed that she had taken the single most powerful action possible to decrease her risk of developing lung cancer when she quit smoking. I counseled her to remain smoke free, and to contact me if she ever had the desire to smoke again so that I can provide resources and tools to help support the effort to remain smoke free. We discussed the time and location of the scan, and that either  Doroteo Glassman RN, Joella Prince, RN or I  or I will call / send a letter with the results within  24-72 hours of receiving them. She has the office contact information in the event she needs to speak with me,  she verbalized understanding of all of the above and had no further questions upon leaving the office.     I explained to the patient that there  has been a high incidence of coronary artery disease noted on these exams. I explained that this is a non-gated exam therefore degree or severity cannot  be determined. This patient is on statin therapy. I have asked the patient to follow-up with their PCP regarding any incidental finding of coronary artery disease and management with diet or medication as they feel is clinically indicated. The patient verbalized understanding of the above and had no further questions.     Magdalen Spatz, NP 01/03/2022

## 2022-01-03 NOTE — Patient Instructions (Signed)
Thank you for participating in the [facility]. It was our pleasure to meet you today. We will call you with the results of your scan within the next few days. Your scan will be assigned a Lung RADS category score by the physicians reading the scans.  This Lung RADS score determines follow up scanning.  See below for description of categories, and follow up screening recommendations. We will be in touch to schedule your follow up screening annually or based on recommendations of our providers. We will fax a copy of your scan results to your Primary Care Physician, or the physician who referred you to the program, to ensure they have the results. Please call the office if you have any questions or concerns regarding your scanning experience or results.  Our office number is [phone]. Please speak with Denise Phelps, RN. , or  Denise Buckner RN, They are  our Lung Cancer Screening RN.'s If They are unavailable when you call, Please leave a message on the voice mail. We will return your call at our earliest convenience.This voice mail is monitored several times a day.  Remember, if your scan is normal, we will scan you annually as long as you continue to meet the criteria for the program. (Age 55-77, Current smoker or smoker who has quit within the last 15 years). If you are a smoker, remember, quitting is the single most powerful action that you can take to decrease your risk of lung cancer and other pulmonary, breathing related problems. We know quitting is hard, and we are here to help.  Please let us know if there is anything we can do to help you meet your goal of quitting. If you are a former smoker, congratulations. We are proud of you! Remain smoke free! Remember you can refer friends or family members through the number above.  We will screen them to make sure they meet criteria for the program. Thank you for helping us take better care of you by  participating in Lung Screening.  You can receive free nicotine replacement therapy ( patches, gum or mints) by calling 1-800-QUIT NOW. Please call so we can get you on the path to becoming  a non-smoker. I know it is hard, but you can do this!  Lung RADS Categories:  Lung RADS 1: no nodules or definitely non-concerning nodules.  Recommendation is for a repeat annual scan in 12 months.  Lung RADS 2:  nodules that are non-concerning in appearance and behavior with a very low likelihood of becoming an active cancer. Recommendation is for a repeat annual scan in 12 months.  Lung RADS 3: nodules that are probably non-concerning , includes nodules with a low likelihood of becoming an active cancer.  Recommendation is for a 6-month repeat screening scan. Often noted after an upper respiratory illness. We will be in touch to make sure you have no questions, and to schedule your 6-month scan.  Lung RADS 4 A: nodules with concerning findings, recommendation is most often for a follow up scan in 3 months or additional testing based on our provider's assessment of the scan. We will be in touch to make sure you have no questions and to schedule the recommended 3 month follow up scan.  Lung RADS 4 B:  indicates findings that are concerning. We will be in touch with you to schedule additional diagnostic testing based on our provider's  assessment of the scan.  Other options for assistance in smoking cessation (   As covered by your insurance benefits)  Hypnosis for smoking cessation  [employer] [phone]  Acupuncture for smoking cessation  [facility] [phone]   

## 2022-01-04 ENCOUNTER — Ambulatory Visit (HOSPITAL_BASED_OUTPATIENT_CLINIC_OR_DEPARTMENT_OTHER)
Admission: RE | Admit: 2022-01-04 | Discharge: 2022-01-04 | Disposition: A | Discharge status: Home / Self Care | Payer: PRIVATE HEALTH INSURANCE | Source: Ambulatory Visit | Attending: Acute Care | Admitting: Acute Care

## 2022-01-04 DIAGNOSIS — Z87891 Personal history of nicotine dependence: Secondary | ICD-10-CM | POA: Insufficient documentation

## 2022-01-04 DIAGNOSIS — Z122 Encounter for screening for malignant neoplasm of respiratory organs: Secondary | ICD-10-CM | POA: Insufficient documentation

## 2022-01-04 DIAGNOSIS — I700 Atherosclerosis of aorta: Secondary | ICD-10-CM | POA: Insufficient documentation

## 2022-01-09 ENCOUNTER — Other Ambulatory Visit: Payer: Self-pay | Admitting: Acute Care

## 2022-01-09 DIAGNOSIS — Z87891 Personal history of nicotine dependence: Secondary | ICD-10-CM

## 2022-01-09 DIAGNOSIS — Z122 Encounter for screening for malignant neoplasm of respiratory organs: Secondary | ICD-10-CM

## 2022-01-11 ENCOUNTER — Encounter: Payer: Self-pay | Admitting: Pulmonary Disease

## 2022-01-11 ENCOUNTER — Ambulatory Visit (INDEPENDENT_AMBULATORY_CARE_PROVIDER_SITE_OTHER): Payer: PRIVATE HEALTH INSURANCE | Admitting: Pulmonary Disease

## 2022-01-11 VITALS — BP 120/78 | HR 71 | Ht 65.0 in | Wt 241.0 lb

## 2022-01-11 DIAGNOSIS — R0602 Shortness of breath: Secondary | ICD-10-CM | POA: Diagnosis not present

## 2022-01-11 DIAGNOSIS — G4733 Obstructive sleep apnea (adult) (pediatric): Secondary | ICD-10-CM

## 2022-01-11 NOTE — Progress Notes (Signed)
[name]    [id number]    11/12/1965  Primary Care Physician:McGowen, Adrian Blackwater, MD  Referring Physician: Laurin Coder, MD [place],  [place] [id number]  Chief complaint:   Follow-up for asthma, obstructive sleep apnea Had a recent low-dose CT  HPI:  Low-dose CT reviewed with the patient today -No significant abnormality was found on the CT  She does have a history of obstructive sleep apnea, not currently on CPAP therapy  History of nonrestorative sleep and insomnia Restless leg syndrome for which she is on Requip  History of asthma-currently on Flovent -Not tolerating powdered inhalers  She still does have symptoms that is concerning for sleep disordered breathing with daytime sleepiness and tiredness, I recent sleep study was limited  Weight is stable  Requip for restless legs, she has been off the medications for a few weeks  She has had increased snoring, increased daytime sleepiness Usually goes to bed between 10 and 11 PM Does take a little bit of time to fall asleep  Tries to get up in the morning by 10  Occupation: No pertinent occupational history Exposures: No significant exposure Smoking history: Never smoker  Outpatient Encounter Medications as of 01/11/2022  Medication Sig   albuterol (PROVENTIL) (2.5 MG/3ML) 0.083% nebulizer solution USE THREE MILLILITERS VIA NEBULIZATION BY MOUTH EVERY 6 HOURS AS NEEDED FOR WHEEZING OR FOR SHORTNESS OF BREATH   albuterol (VENTOLIN HFA) 108 (90 Base) MCG/ACT inhaler INHALE ONE PUFF BY MOUTH EVERY 6 HOURS AS NEEDED FOR WHEEZING OR FOR SHORTNESS OF BREATH   amphetamine-dextroamphetamine (ADDERALL) 20 MG tablet Take 1 tablet (20 mg total) by mouth 2 (two) times daily.   atorvastatin (LIPITOR) 80 MG tablet TAKE ONE TABLET BY MOUTH DAILY   cetirizine (ZYRTEC) 10 MG tablet Take 10 mg by mouth daily.   citalopram (CELEXA) 40 MG tablet TAKE ONE TABLET BY MOUTH DAILY   clonazePAM  (KLONOPIN) 0.5 MG tablet Take 1 tablet (0.5 mg total) by mouth 2 (two) times daily as needed.   fluticasone (CUTIVATE) 0.05 % cream Apply topically 2 (two) times daily.   fluticasone (FLOVENT HFA) 220 MCG/ACT inhaler Inhale 2 puffs into the lungs every 12 (twelve) hours.   ibuprofen (ADVIL,MOTRIN) 200 MG tablet Take 800 mg by mouth as needed for fever or moderate pain.    MAGNESIUM OXIDE PO Take 800 mg by mouth daily.   metFORMIN (GLUCOPHAGE) 1000 MG tablet TAKE ONE TABLET BY MOUTH TWICE A DAY WITH MEAL   Olopatadine-Mometasone (RYALTRIS) 665-25 MCG/ACT SUSP Place 1-2 sprays into the nose in the morning and at bedtime.   pregabalin (LYRICA) 75 MG capsule Take 75 mg by mouth.   rOPINIRole (REQUIP) 1 MG tablet Take 1 tablet (1 mg total) by mouth at bedtime.   triamterene-hydrochlorothiazide (MAXZIDE-25) 37.5-25 MG tablet Take 1 tablet by mouth daily.   No facility-administered encounter medications on file as of 01/11/2022.    Allergies as of 01/11/2022 - Review Complete 01/11/2022  Allergen Reaction Noted   Metoprolol Other (See Comments) 01/21/2007   Sulfa antibiotics Nausea And Vomiting 03/28/2013   Verapamil hcl er Hypertension 10/18/2020    Past Medical History:  Diagnosis Date   Allergic rhinitis    Anxiety and depression    Attention deficit disorder (ADD) in adult    Atypical chest pain    Myocardial perf imaging 04/04/16: Low risk stress nuclear study with prior inferior infarct and mild peri-infarct ischemia; EF  55 with mild hypokinesis of the inferior wall.  Cath normal, so stress test was FALSE POS.   Cholelithiasis 03/2016   u/s: no signs of cholecystitis   Fibromyalgia syndrome    GERD (gastroesophageal reflux disease)    History of breast cancer 2012   left mastectomy and chemotherapy   History of chemotherapy    2012 completed   Hyperlipidemia    Hypertension    Moderate persistent asthma    dx'd age 61 (Dr. Gwenette Greet) hx of noncompliance with controller therapy due to  cost. PFTs 01/2019->mild small airways obst dz, no resp to bronchodil. Trelegy started 02/2019 by Dr. Ander Slade. Breztri helpful as of 2021.   Morbid obesity ([place])    NASH (nonalcoholic steatohepatitis) 02/2010   u/s confirmed 02/2010 (hx of transaminitis).  Repeat u/s 03/2016 showed no sign of cirrhosis or liver mass, just moderate hepatic steatosis. AST/ALT stable 06/2019   Neuropathy of both upper extremities    and both feet   OSA (obstructive sleep apnea)    Dr Gwenette Greet initially: pt couldn't tolerate CPAP.  Re-eval 12/2017 re-confirmed severe OSA, CPAP at 11 recommended.   Ovarian mass    resected 07/12/10   Recurrent upper respiratory infection (URI)    RLS (restless legs syndrome)    Type II or unspecified type diabetes mellitus without mention of complication, uncontrolled 05/15/2012    Past Surgical History:  Procedure Laterality Date   BILATERAL SALPINGOOPHORECTOMY  07/12/2010   enlarged ovary   CARDIOVASCULAR STRESS TEST  04/04/2016   Low risk stress nuclear study with prior inferior infarct and mild peri-infarct ischemia; EF 55% with mild hypokinesis of the inferior wall.  F/u cath was NORMAL, so this is suspected to be a false pos stress test (diaphragmatic attenuation)   G 2 P 2     LEFT HEART CATH AND CORONARY ANGIOGRAPHY N/A 04/20/2016   Normal coronaries.  EF 55-65%, normal LV function. (Stress test prior to this was false pos due to diaphragmatic attenuation). Procedure: Left Heart Cath and Coronary Angiography;  Surgeon: Peter M Martinique, MD;  Location: [facility];  Service: Cardiovascular;  Laterality: N/A;   MASTECTOMY  06/21/2010   Left ; [facility]    Family History  Adopted: Yes  Problem Relation Age of Onset   Healthy Daughter        8   Healthy Son        13   Allergic rhinitis Neg Hx    Angioedema Neg Hx    Asthma Neg Hx    Eczema Neg Hx     Social History   Socioeconomic History   Marital status: Married    Spouse name: Not on file    Number of children: 2   Years of education: Not on file   Highest education level: Not on file  Occupational History   Occupation: Curator: Training and development officer j  Tobacco Use   Smoking status: Former    Packs/day: 0.50    Years: 24.00    Total pack years: 12.00    Types: Cigarettes    Quit date: 08/16/2018    Years since quitting: 3.4    Passive exposure: Never   Smokeless tobacco: Never  Vaping Use   Vaping Use: Never used  Substance and Sexual Activity   Alcohol use: No   Drug use: No   Sexual activity: Not on file  Other Topics Concern   Not on file  Social History Narrative  Not working as of 09/2017.  Previously worked as a Art therapist.   Lives with husband and two children.   2-3 caffeine drinks daily       Social Determinants of Health   Financial Resource Strain: Not on file  Food Insecurity: Not on file  Transportation Needs: Not on file  Physical Activity: Not on file  Stress: Not on file  Social Connections: Not on file  Intimate Partner Violence: Not on file    Review of Systems  Constitutional:  Positive for fatigue.  HENT: Negative.    Respiratory:  Positive for apnea and shortness of breath.   Cardiovascular: Negative.   Gastrointestinal: Negative.   Musculoskeletal: Negative.   Psychiatric/Behavioral:  Positive for sleep disturbance.     Vitals:   01/11/22 1422  BP: 120/78  Pulse: 71  SpO2: 97%    Physical Exam Constitutional:      Appearance: She is well-developed. She is obese.  HENT:     Head: Normocephalic and atraumatic.  Eyes:     General:        Right eye: No discharge.        Left eye: No discharge.     Conjunctiva/sclera: Conjunctivae normal.     Pupils: Pupils are equal, round, and reactive to light.  Neck:     Thyroid: No thyromegaly.     Trachea: No tracheal deviation.  Cardiovascular:     Rate and Rhythm: Normal rate and regular rhythm.  Pulmonary:     Effort: Pulmonary effort is normal. No respiratory  distress.     Breath sounds: Normal breath sounds. No stridor. No wheezing, rhonchi or rales.  Musculoskeletal:     Cervical back: Normal range of motion and neck supple.  Neurological:     Mental Status: She is alert.  Psychiatric:        Mood and Affect: Mood normal.    Most recent sleep study reviewed-limited by insomnia   Assessment:  History of obstructive sleep apnea -Most recent study limited by insomnia -Has not been on CPAP therapy  Restless leg syndrome -Continue Requip  Nonrestorative sleep  Shortness of breath/asthma -Currently on Flovent -Will consider to switch to [place]  Class III obesity   Plan/Recommendations: .  Continue Flovent  .  Continue Requip  .  Weight loss efforts encouraged  .  Low-dose CT with no significant finding, yearly CT  .  Follow-up in 6 months  .  Alvesco may also be affordable for the started about $35  Sherrilyn Rist MD [facility] 01/11/2022, 2:30 PM  CC: Laurin Coder, MD

## 2022-01-11 NOTE — Patient Instructions (Signed)
CT scan does not show any findings of concern  Continue using Flovent  We can try Alvesco and as we discussed, you may give [place] a call to send in the prescription when ready  Tentative follow-up in 6 months  Call with significant concerns

## 2022-01-13 ENCOUNTER — Other Ambulatory Visit: Payer: Self-pay | Admitting: Pulmonary Disease

## 2022-01-13 ENCOUNTER — Encounter: Payer: Self-pay | Admitting: Pulmonary Disease

## 2022-01-13 MED ORDER — ALVESCO 160 MCG/ACT IN AERS: 1.0000 | INHALATION_SPRAY | Freq: Two times a day (BID) | RESPIRATORY_TRACT | 6 refills | Status: DC

## 2022-01-13 NOTE — Telephone Encounter (Signed)
Will send in 

## 2022-01-18 NOTE — Progress Notes (Deleted)
Follow Up Note  RE: [name] MRN: [id number] DOB: 1966/02/16 Date of Office Visit: 01/19/2022  Referring provider: No ref. provider found Primary care provider: Tammi Sou, MD  Chief Complaint: No chief complaint on file.  History of Present Illness: I had the pleasure of seeing [name] for a follow up visit at the [facility] on 01/18/2022. She is a 56 y.o. female, who is being followed for allergic rhino conjunctivitis, asthma. Her previous allergy office visit was on 11/15/2021 with Dr. Maudie Mercury. Today is a regular follow up visit.  Other allergic rhinitis Perennial rhinoconjunctivitis symptoms for many years.  Tried Zyrtec and plan with some benefit.  No prior AIT. 10/19/2021 bloodwork positive to cat, dog, grass, mold, trees, ragweed, weed.  Borderline to dust mites. 1 dog, 1 cat at home. Start environmental control measures as below - especially regarding the pet dander.  Discussed with patient that I'm not sure how much of the above allergens are contributing to her breathing issues. Use over the counter antihistamines such as Zyrtec (cetirizine), Claritin (loratadine), Allegra (fexofenadine), or Xyzal (levocetirizine) daily as needed. May take twice a day during allergy flares. May switch antihistamines every few months. Start Singulair (montelukast) 66m daily at night. Cautioned that in some children/adults can experience behavioral changes including hyperactivity, agitation, depression, sleep disturbances and suicidal ideations. These side effects are rare, but if you notice them you should notify me and discontinue Singulair (montelukast). Start Ryaltris (olopatadine + mometasone nasal spray combination) 1-2 sprays per nostril twice a day. Sample given. This replaces your other nasal sprays. If this works well for you, then have [facility] ship the medication to your home - prescription already sent in.  Consider allergy injections for long term  control if above medications do not help the symptoms - handout given.  Will need to do a few more skin testing prior to starting.   Moderate persistent asthma without complication Noted issues with her breathing x 2 years. Follows with pulmonology. Initially Airduo was helpful but now on Breztri x 2 weeks and using albuterol neb daily at night. Ex-smoker. 2023 CXR - chronic lung changes.  Today's spirometry showed some restriction most likely due to body habitus. Follow up with pulmonology as scheduled. If symptoms not improving - discussed adding on biologics (eos 400, IgE 399). Consider ENT evaluation. See handout on heartburn control. Daily controller medication(s): continue Breztri 2 puffs twice a day with spacer and rinse mouth afterwards. Start Singulair (montelukast) 135mdaily at night. May use albuterol rescue inhaler 2 puffs or nebulizer every 4 to 6 hours as needed for shortness of breath, chest tightness, coughing, and wheezing. May use albuterol rescue inhaler 2 puffs 5 to 15 minutes prior to strenuous physical activities. Monitor frequency of use.  Only use albuterol if needed.    Assessment and Plan: MoLatoris a [age].o. female with: No problem-specific Assessment & Plan notes found for this encounter.  No follow-ups on file.  No orders of the defined types were placed in this encounter.  Lab Orders  No laboratory test(s) ordered today    Diagnostics: Spirometry:  Tracings reviewed. Her effort: {Blank single:19197::"Good reproducible efforts.","It was hard to get consistent efforts and there is a question as to whether this reflects a maximal maneuver.","Poor effort, data can not be interpreted."} FVC: ***L FEV1: ***L, ***% predicted FEV1/FVC ratio: ***% Interpretation: {Blank single:19197::"Spirometry consistent with mild obstructive disease","Spirometry consistent with moderate obstructive disease","Spirometry consistent with severe obstructive disease","Spirometry  consistent with possible restrictive disease","Spirometry consistent with mixed obstructive and restrictive disease","Spirometry uninterpretable due to technique","Spirometry consistent with normal pattern","No overt abnormalities noted given today's efforts"}.  Please see scanned spirometry results for details.  Skin Testing: {Blank single:19197::"Select foods","Environmental allergy panel","Environmental allergy panel and select foods","Food allergy panel","None","Deferred due to recent antihistamines use"}. *** Results discussed with patient/family.   Medication List:  Current Outpatient Medications  Medication Sig Dispense Refill  . albuterol (PROVENTIL) (2.5 MG/3ML) 0.083% nebulizer solution USE THREE MILLILITERS VIA NEBULIZATION BY MOUTH EVERY 6 HOURS AS NEEDED FOR WHEEZING OR FOR SHORTNESS OF BREATH 75 mL 5  . albuterol (VENTOLIN HFA) 108 (90 Base) MCG/ACT inhaler INHALE ONE PUFF BY MOUTH EVERY 6 HOURS AS NEEDED FOR WHEEZING OR FOR SHORTNESS OF BREATH 18 g 1  . amphetamine-dextroamphetamine (ADDERALL) 20 MG tablet Take 1 tablet (20 mg total) by mouth 2 (two) times daily. 60 tablet 0  . atorvastatin (LIPITOR) 80 MG tablet TAKE ONE TABLET BY MOUTH DAILY 30 tablet 5  . cetirizine (ZYRTEC) 10 MG tablet Take 10 mg by mouth daily.    . ciclesonide (ALVESCO) 160 MCG/ACT inhaler Inhale 1 puff into the lungs 2 (two) times daily. 1 each 6  . citalopram (CELEXA) 40 MG tablet TAKE ONE TABLET BY MOUTH DAILY 90 tablet 0  . clonazePAM (KLONOPIN) 0.5 MG tablet Take 1 tablet (0.5 mg total) by mouth 2 (two) times daily as needed. 60 tablet 3  . fluticasone (CUTIVATE) 0.05 % cream Apply topically 2 (two) times daily. 60 g 1  . ibuprofen (ADVIL,MOTRIN) 200 MG tablet Take 800 mg by mouth as needed for fever or moderate pain.     Marland Kitchen MAGNESIUM OXIDE PO Take 800 mg by mouth daily.    . metFORMIN (GLUCOPHAGE) 1000 MG tablet TAKE ONE TABLET BY MOUTH TWICE A DAY WITH MEAL 180 tablet 0  . Olopatadine-Mometasone  (RYALTRIS) [id number] MCG/ACT SUSP Place 1-2 sprays into the nose in the morning and at bedtime. 29 g 5  . pregabalin (LYRICA) 75 MG capsule Take 75 mg by mouth.    Marland Kitchen rOPINIRole (REQUIP) 1 MG tablet Take 1 tablet (1 mg total) by mouth at bedtime. 30 tablet 3  . triamterene-hydrochlorothiazide (MAXZIDE-25) 37.5-25 MG tablet Take 1 tablet by mouth daily. 90 tablet 3   No current facility-administered medications for this visit.   Allergies: Allergies  Allergen Reactions  . Metoprolol Other (See Comments)    REACTION: " chest pain"  . Sulfa Antibiotics Nausea And Vomiting  . Verapamil Hcl Er Hypertension    Increased HR, blood pressure Makes breathing worse   I reviewed her past medical history, social history, family history, and environmental history and no significant changes have been reported from her previous visit.  Review of Systems  Constitutional:  Negative for appetite change, chills, fever and unexpected weight change.  HENT:  Negative for congestion and rhinorrhea.   Eyes:  Negative for itching.  Respiratory:  Positive for shortness of breath. Negative for cough, chest tightness and wheezing.   Cardiovascular:  Negative for chest pain.  Gastrointestinal:  Negative for abdominal pain.  Genitourinary:  Negative for difficulty urinating.  Skin:  Negative for rash.  Allergic/Immunologic: Positive for environmental allergies.  Neurological:  Negative for headaches.   Objective: LMP 07/14/2010 Comment: ovaries removed 2012 There is no height or weight on file to calculate BMI. Physical Exam Vitals and nursing note reviewed.  Constitutional:      Appearance: Normal appearance. She is well-developed. She is obese.  HENT:  Head: Normocephalic and atraumatic.     Right Ear: Tympanic membrane and external ear normal.     Left Ear: Tympanic membrane and external ear normal.     Nose: Nose normal.     Mouth/Throat:     Mouth: Mucous membranes are moist.     Pharynx:  Oropharynx is clear.  Eyes:     Conjunctiva/sclera: Conjunctivae normal.  Cardiovascular:     Rate and Rhythm: Normal rate and regular rhythm.     Heart sounds: Normal heart sounds. No murmur heard.    No friction rub. No gallop.  Pulmonary:     Effort: Pulmonary effort is normal.     Breath sounds: Normal breath sounds. No wheezing, rhonchi or rales.  Musculoskeletal:     Cervical back: Neck supple.  Skin:    General: Skin is warm.     Findings: No rash.  Neurological:     Mental Status: She is alert and oriented to person, place, and time.  Psychiatric:        Behavior: Behavior normal.  Previous notes and tests were reviewed. The plan was reviewed with the patient/family, and all questions/concerned were addressed.  It was my pleasure to see Deletha today and participate in her care. Please feel free to contact me with any questions or concerns.  Sincerely,  Rexene Alberts, DO Allergy & Immunology  [facility] office: [place] office: [phone]

## 2022-01-19 ENCOUNTER — Ambulatory Visit: Payer: No Typology Code available for payment source | Admitting: Allergy

## 2022-01-19 DIAGNOSIS — J3089 Other allergic rhinitis: Secondary | ICD-10-CM

## 2022-01-19 DIAGNOSIS — J4540 Moderate persistent asthma, uncomplicated: Secondary | ICD-10-CM

## 2022-01-19 DIAGNOSIS — J309 Allergic rhinitis, unspecified: Secondary | ICD-10-CM

## 2022-01-27 ENCOUNTER — Encounter: Payer: Self-pay | Admitting: Pulmonary Disease

## 2022-02-03 ENCOUNTER — Other Ambulatory Visit: Payer: Self-pay | Admitting: Pulmonary Disease

## 2022-02-08 LAB — COLOGUARD: COLOGUARD: NEGATIVE

## 2022-03-08 ENCOUNTER — Encounter: Payer: Self-pay | Admitting: Pulmonary Disease

## 2022-03-09 ENCOUNTER — Other Ambulatory Visit: Payer: Self-pay | Admitting: Pulmonary Disease

## 2022-03-09 MED ORDER — ALVESCO 160 MCG/ACT IN AERS: 2.0000 | INHALATION_SPRAY | Freq: Two times a day (BID) | RESPIRATORY_TRACT | 6 refills | Status: DC

## 2022-03-09 NOTE — Telephone Encounter (Signed)
Mychart message sent by pt: [name] [facility] (supporting Adewale A Olalere, MD)16 hours ago (3:45 PM)    Hello, I am still having to supplement the alvesco with albuterol.  Is that normal?  It's mostly at night.  It's like the lower half of my lungs don't get clear.  Does it sound like I need a higher dose of alvesco?    Dr. Jenetta Downer, please advise.

## 2022-03-09 NOTE — Telephone Encounter (Signed)
Yes, can increase Alvesco from 1 puff twice a day to 2 puffs twice a day  Prescription will be sent to pharmacy for you  Use albuterol as needed up to 4 times a day if needed

## 2022-03-17 ENCOUNTER — Other Ambulatory Visit: Payer: Self-pay | Admitting: Pulmonary Disease

## 2022-04-12 ENCOUNTER — Other Ambulatory Visit: Payer: Self-pay | Admitting: Pulmonary Disease

## 2022-04-12 NOTE — Telephone Encounter (Signed)
Please advise on refill request

## 2022-04-18 ENCOUNTER — Other Ambulatory Visit: Payer: Self-pay

## 2022-05-13 ENCOUNTER — Other Ambulatory Visit: Payer: Self-pay | Admitting: Nurse Practitioner

## 2022-05-13 DIAGNOSIS — J3089 Other allergic rhinitis: Secondary | ICD-10-CM

## 2022-06-15 ENCOUNTER — Other Ambulatory Visit: Payer: Self-pay | Admitting: Nurse Practitioner

## 2022-06-15 DIAGNOSIS — J3089 Other allergic rhinitis: Secondary | ICD-10-CM

## 2022-06-19 NOTE — Telephone Encounter (Signed)
Refilled montelukast 10 mg x 1,  Patient last OV 01/11/2022.  Per Dr. Wynona Neat:  Follow-up in 6 months (around 07/12/2022).  Will refill x one and patient needs return OV.

## 2022-07-13 ENCOUNTER — Other Ambulatory Visit: Payer: Self-pay | Admitting: Pulmonary Disease

## 2022-08-08 ENCOUNTER — Other Ambulatory Visit: Payer: Self-pay | Admitting: Family Medicine

## 2022-08-14 ENCOUNTER — Other Ambulatory Visit (HOSPITAL_COMMUNITY): Payer: Self-pay

## 2022-08-14 ENCOUNTER — Telehealth: Payer: Self-pay

## 2022-08-14 NOTE — Telephone Encounter (Signed)
*  Pulm  PA request received via CMM for Alvesco 80MCG/ACT aerosol  PA has been submitted to [facility] [employer] of [place] and is pending additional questions/determination.  Key: [id number]

## 2022-08-15 NOTE — Telephone Encounter (Signed)
PA has been approved from 08/14/2022 until further notice.   Approval letter has been attached in patients media for retention.

## 2022-09-12 ENCOUNTER — Other Ambulatory Visit: Payer: Self-pay

## 2022-09-12 DIAGNOSIS — J3089 Other allergic rhinitis: Secondary | ICD-10-CM

## 2022-09-12 NOTE — Telephone Encounter (Signed)
Patient requesting refill on montelukast 10 mg.  Per chart note on 11/30/2021 by Dr Wynona Neat:  [DISCONTINUED] montelukast (SINGULAIR) 10 MG tablet Take 1 tablet (10 mg total) by mouth at bedtime. (Patient not taking: Reported on 11/15/2021)    Per last OV note on 01/11/2022:   Follow-up in 6 months   Patient will need follow up visit for further refills

## 2022-09-12 NOTE — Telephone Encounter (Signed)
From: Verlon Au To: Office of Noemi Chapel, NP Sent: 09/08/2022 4:02 PM EDT Subject: Medication Renewal Request  Refills have been requested for the following medications:   montelukast (SINGULAIR) 10 MG tablet [Adewale A Olalere]  Preferred pharmacy: [facility] - [place], [place] - [address] Delivery method: [employer]

## 2022-09-18 ENCOUNTER — Other Ambulatory Visit (HOSPITAL_COMMUNITY): Payer: Self-pay

## 2022-09-20 ENCOUNTER — Other Ambulatory Visit: Payer: Self-pay | Admitting: Pulmonary Disease

## 2022-09-20 DIAGNOSIS — J3089 Other allergic rhinitis: Secondary | ICD-10-CM

## 2022-10-04 ENCOUNTER — Ambulatory Visit: Payer: No Typology Code available for payment source | Admitting: Nurse Practitioner

## 2022-10-25 ENCOUNTER — Ambulatory Visit (INDEPENDENT_AMBULATORY_CARE_PROVIDER_SITE_OTHER): Payer: Medicaid Other | Admitting: Nurse Practitioner

## 2022-10-25 ENCOUNTER — Encounter: Payer: Self-pay | Admitting: Nurse Practitioner

## 2022-10-25 ENCOUNTER — Telehealth: Payer: Self-pay | Admitting: Nurse Practitioner

## 2022-10-25 VITALS — BP 132/82 | HR 96 | Ht 65.0 in | Wt 251.0 lb

## 2022-10-25 DIAGNOSIS — J4540 Moderate persistent asthma, uncomplicated: Secondary | ICD-10-CM

## 2022-10-25 DIAGNOSIS — G4733 Obstructive sleep apnea (adult) (pediatric): Secondary | ICD-10-CM | POA: Diagnosis not present

## 2022-10-25 DIAGNOSIS — J4531 Mild persistent asthma with (acute) exacerbation: Secondary | ICD-10-CM | POA: Diagnosis not present

## 2022-10-25 MED ORDER — ALVESCO 160 MCG/ACT IN AERS: 2.0000 | INHALATION_SPRAY | Freq: Two times a day (BID) | RESPIRATORY_TRACT | 11 refills | Status: DC

## 2022-10-25 MED ORDER — PREDNISONE 20 MG PO TABS: 40.0000 mg | ORAL_TABLET | Freq: Every day | ORAL | 0 refills | Status: AC

## 2022-10-25 NOTE — Progress Notes (Signed)
@Patient  ID: [name], female    DOB: 1965-12-11, 57 y.o.   MRN: [id number]  Chief Complaint  Patient presents with   Follow-up    Ran out of Alvesco a few months ago- insurance stopped covering. She is using her albuterol inhaler 3-4 x per day and occ will use neb at bedtime.     Referring provider: Jeoffrey Massed, MD  HPI: 57 year old female, former smoker followed for asthma and OSA. She is a patient of Dr. Trena Platt and last seen in office on 01/11/2022. Past medical history significant for HTN, allergic rhinitis, DM, BPPV, RLS, HLD, ADHD, obesity, hx of breast cancer stage IA.   TEST/EVENTS:  07/23/2015 CTA chest: no evidence of PE. There are subcm mediastinaly lymph nodes. There is some mild scarring in the lateral left base. No evidence of parenchymal lung edema or consolidation. Hepatic steatosis.  02/04/2019 PFT: FVC 82, FEV1 85, ratio 82, TLC 110, DLCO 91. No significant BD; did have midflow reversibility  06/27/2021 CXR 2 view: both lungs are clear  11/20/2021 split-night sleep study: Negative for sleep apnea but suboptimal due to insomnia.  Sleep onset and sleep maintenance insomnia.  Periodic limb movement and restless legs. 01/04/2022 LDCT chest lung cancer screen: Small hiatal hernia.  Central airways are patent.  Linear opacity of left upper lobe, likely scarring or atelectasis.  Lung RADS 1.  Atherosclerosis.  06/27/2021: OV with Dr. Wynona Neat. Hx of asthma; using rescue inhaler frequently. Previously on Symbicort but not on any maintenance inhalers. Unable to tolerate DPIs. She has also had trouble with sleeping and RLS; not wearing CPAP. Progressive weight gain. HST ordered. CXR clear.   10/19/2021: OV with Dory Demont NP for follow up. She reports that she was doing well on the AirDuo and breathing was stable up until June. She unfortunately lost one of her best friends when they unexpectedly passed away. She started smoking again due to the emotional stress and feels  that after this, her breathing has not been as great. She has quit smoking since but hasn't noticed much difference. Still having trouble feeling like she can expel all the air from her lungs when she breathes out and has some chest tightness at times. Symptoms are worse at bedtime. Uses her albuterol neb every evening and feels as though this helps. She denies cough, wheezing, chest pain, orthopnea, PND, leg swelling. She uses her AirDuo twice daily and takes zyrtec daily for allergies.   Never completed HST; states that insurance denied this. Still having daytime fatigue and snoring at night. Denies drowsy driving, sleep parasomnias, or morning headaches.   01/21/2022: OV with Dr. Wynona Neat.  No significant abnormality found on CT.  Does have a history of OSA and not on CPAP currently.  History of nonrestorative sleep and insomnia.  She also has restless leg for which she is on Requip.  History of asthma and currently on Flovent.  Changed to Alvesco.  Has not tolerated powdered inhalers in the past.  Still having symptoms that is concerning for sleep disordered breathing with daytime sleepiness and tiredness.  Recent sleep study was limited due to insomnia.  Encouraged to work on weight loss measures.  Yearly CT.  10/25/2022: Today - follow up Patient resents today for follow-up.  Her breathing has been giving her a hard time recently.  She ran out of her inhaler sometime around June and has been having trouble since.  Had an issue with insurance and was switched from [place] to  [employer] and then back to [place] again.  She was told by her pharmacy that she would need to go on an alternative prescription and that [place] would not take another prior authorization for the Alvesco.  She feels that this has worked the best for her out of anything regarding her asthma.  She has noticed that she gets more short winded with cleaning and then she feels like she has trouble getting her breath out, which seems to be worse  at night.  Does have some wheezing.  No significant cough, chest congestion, fevers or chills. She still is having a lot of trouble with daytime fatigue, loud snoring. Feels like her sleep is poor quality.  No issues with drowsy driving or morning headaches.  Last sleep study in the lab was limited due to insomnia.  She would prefer to do a home sleep study if at all possible.  Does not feel like she would be able to do another study in the lab.   Allergies  Allergen Reactions   Metoprolol Other (See Comments)    REACTION: " chest pain"   Sulfa Antibiotics Nausea And Vomiting   Verapamil Hcl Er Hypertension    Increased HR, blood pressure Makes breathing worse    Immunization History  Administered Date(s) Administered   Hep B, Unspecified 07/16/2001   Hepatitis A 11/22/2005   Hepatitis A, Adult 11/22/2005   Hepatitis B 07/16/2001   Influenza Split 01/10/2006   Influenza, Seasonal, Injecte, Preservative Fre 01/11/2017, 03/11/2020   Influenza,inj,Quad PF,6+ Mos 12/10/2012, 01/11/2017, 12/13/2017, 12/09/2018, 03/11/2020, 11/30/2021   PFIZER(Purple Top)SARS-COV-2 Vaccination 06/17/2019, 07/15/2019   Pneumococcal Polysaccharide-23 12/10/2012   Pneumococcal-Unspecified 12/10/2012   Td 04/17/2003   Tdap 04/21/2013   Typhoid Live 11/22/2005   Yellow Fever 11/22/2005   Zoster Recombinant(Shingrix) 10/03/2017, 12/19/2017    Past Medical History:  Diagnosis Date   Allergic rhinitis    Anxiety and depression    Attention deficit disorder (ADD) in adult    Atypical chest pain    Myocardial perf imaging 04/04/16: Low risk stress nuclear study with prior inferior infarct and mild peri-infarct ischemia; EF 55 with mild hypokinesis of the inferior wall.  Cath normal, so stress test was FALSE POS.   Cholelithiasis 03/2016   u/s: no signs of cholecystitis   Fibromyalgia syndrome    GERD (gastroesophageal reflux disease)    History of breast cancer 2012   left mastectomy and chemotherapy    History of chemotherapy    2012 completed   Hyperlipidemia    Hypertension    Moderate persistent asthma    dx'd age 41 (Dr. Shelle Iron) hx of noncompliance with controller therapy due to cost. PFTs 01/2019->mild small airways obst dz, no resp to bronchodil. Trelegy started 02/2019 by Dr. Wynona Neat. Breztri helpful as of 2021.   Morbid obesity (HCC)    NASH (nonalcoholic steatohepatitis) 02/2010   u/s confirmed 02/2010 (hx of transaminitis).  Repeat u/s 03/2016 showed no sign of cirrhosis or liver mass, just moderate hepatic steatosis. AST/ALT stable 06/2019   Neuropathy of both upper extremities    and both feet   OSA (obstructive sleep apnea)    Dr Shelle Iron initially: pt couldn't tolerate CPAP.  Re-eval 12/2017 re-confirmed severe OSA, CPAP at 11 recommended.   Ovarian mass    resected 07/12/10   Recurrent upper respiratory infection (URI)    RLS (restless legs syndrome)    Type II or unspecified type diabetes mellitus without mention of complication, uncontrolled 05/15/2012    Tobacco History:  Social History   Tobacco Use  Smoking Status Former   Current packs/day: 0.00   Average packs/day: 0.5 packs/day for 24.0 years (12.0 ttl pk-yrs)   Types: Cigarettes   Start date: 08/16/1994   Quit date: 08/16/2018   Years since quitting: 4.2   Passive exposure: Never  Smokeless Tobacco Never   Counseling given: Not Answered   Outpatient Medications Prior to Visit  Medication Sig Dispense Refill   albuterol (PROVENTIL) (2.5 MG/3ML) 0.083% nebulizer solution USE THREE MILLILITERS VIA NEBULIZATION BY MOUTH EVERY 6 HOURS AS NEEDED FOR WHEEZING OR FOR SHORTNESS OF BREATH 75 mL 5   albuterol (VENTOLIN HFA) 108 (90 Base) MCG/ACT inhaler INHALE ONE PUFF BY MOUTH EVERY 6 HOURS AS NEEDED FOR WHEEZING OR FOR SHORTNESS OF BREATH 18 g 1   amphetamine-dextroamphetamine (ADDERALL) 20 MG tablet Take 1 tablet (20 mg total) by mouth 2 (two) times daily. 60 tablet 0   atorvastatin (LIPITOR) 80 MG tablet TAKE ONE  TABLET BY MOUTH DAILY 30 tablet 5   cetirizine (ZYRTEC) 10 MG tablet Take 10 mg by mouth daily.     citalopram (CELEXA) 40 MG tablet TAKE ONE TABLET BY MOUTH DAILY 90 tablet 0   clonazePAM (KLONOPIN) 0.5 MG tablet TAKE ONE TABLET BY MOUTH TWICE DAILY AS NEEDED 60 tablet 3   fluticasone (CUTIVATE) 0.05 % cream Apply topically 2 (two) times daily. 60 g 1   ibuprofen (ADVIL,MOTRIN) 200 MG tablet Take 800 mg by mouth as needed for fever or moderate pain.      MAGNESIUM OXIDE PO Take 800 mg by mouth daily.     montelukast (SINGULAIR) 10 MG tablet TAKE ONE TABLET BY MOUTH AT BEDTIME 30 tablet 0   Olopatadine-Mometasone (RYALTRIS) 665-25 MCG/ACT SUSP Place 1-2 sprays into the nose in the morning and at bedtime. 29 g 5   pregabalin (LYRICA) 75 MG capsule Take 75 mg by mouth.     rOPINIRole (REQUIP) 1 MG tablet TAKE ONE TABLET BY MOUTH at bedtime 30 tablet 3   Semaglutide,0.25 or 0.5MG /DOS, 2 MG/3ML SOPN Inject 0.5 mg into the skin once a week.     triamterene-hydrochlorothiazide (MAXZIDE-25) 37.5-25 MG tablet Take 1 tablet by mouth daily. 90 tablet 3   ciclesonide (ALVESCO) 160 MCG/ACT inhaler Inhale 2 puffs into the lungs 2 (two) times daily. 1 each 6   metFORMIN (GLUCOPHAGE) 1000 MG tablet TAKE ONE TABLET BY MOUTH TWICE A DAY WITH MEAL 180 tablet 0   No facility-administered medications prior to visit.     Review of Systems:   Constitutional: No weight loss or gain, night sweats, fevers, chills, or lassitude. +excessive daytime fatigue HEENT: No headaches, difficulty swallowing, tooth/dental problems, or sore throat. No sneezing, itching, ear ache, nasal congestion, or post nasal drip CV:  No chest pain, orthopnea, PND, swelling in lower extremities, anasarca, dizziness, palpitations, syncope Resp: +shortness of breath with exertion; chest tightness; wheezing; snoring. No excess mucus or change in color of mucus. No productive or non-productive. No hemoptysis. No wheezing.  No chest wall  deformity GI:  No heartburn, indigestion, abdominal pain, nausea, vomiting, diarrhea, change in bowel habits, loss of appetite, bloody stools.  Skin: No rash, lesions, ulcerations MSK:  No joint pain or swelling.   Neuro: No dizziness or lightheadedness.  Psych: No depression or anxiety. Mood stable. +sleep disturbance    Physical Exam:  BP 132/82 (BP Location: Right Arm, Cuff Size: Large)   Pulse 96   Ht 5\' 5"  (1.651 m)   Wt 251  lb (113.9 kg)   LMP 07/14/2010 Comment: ovaries removed 2012  SpO2 96%   BMI 41.77 kg/m   GEN: Pleasant, interactive, well-appearing; obese; in no acute distress. HEENT:  Normocephalic and atraumatic. PERRLA. Sclera white. Nasal turbinates pink, moist and patent bilaterally. No rhinorrhea present. Oropharynx pink and moist, without exudate or edema. No lesions, ulcerations, or postnasal drip.  NECK:  Supple w/ fair ROM. No JVD present. Normal carotid impulses w/o bruits. Thyroid symmetrical with no goiter or nodules palpated. No lymphadenopathy.   CV: RRR, no m/r/g, no peripheral edema. Pulses intact, +2 bilaterally. No cyanosis, pallor or clubbing. PULMONARY:  Unlabored, regular breathing. End expiratory wheeze bilaterally. No accessory muscle use. No dullness to percussion. GI: BS present and normoactive. Soft, non-tender to palpation. No organomegaly or masses detected.  MSK: No erythema, warmth or tenderness. Cap refil <2 sec all extrem. No deformities or joint swelling noted.  Neuro: A/Ox3. No focal deficits noted.   Skin: Warm, no lesions or rashe Psych: Normal affect and behavior. Judgement and thought content appropriate.     Lab Results:  CBC    Component Value Date/Time   WBC 9.0 10/19/2021 1006   RBC 4.84 10/19/2021 1006   HGB 15.0 10/19/2021 1006   HGB 14.0 01/15/2015 0911   HCT 44.4 10/19/2021 1006   HCT 40.2 01/15/2015 0911   PLT 317.0 10/19/2021 1006   PLT 277 01/15/2015 0911   MCV 91.7 10/19/2021 1006   MCV 91.3 01/15/2015 0911    MCH 31.4 07/02/2021 2337   MCHC 33.8 10/19/2021 1006   RDW 12.7 10/19/2021 1006   RDW 12.8 01/15/2015 0911   LYMPHSABS 3.8 10/19/2021 1006   LYMPHSABS 3.0 01/15/2015 0911   MONOABS 0.8 10/19/2021 1006   MONOABS 0.6 01/15/2015 0911   EOSABS 0.4 10/19/2021 1006   EOSABS 0.3 01/15/2015 0911   BASOSABS 0.1 10/19/2021 1006   BASOSABS 0.1 01/15/2015 0911    BMET    Component Value Date/Time   NA 134 (L) 07/02/2021 2337   NA 141 01/15/2015 0911   K 4.4 07/02/2021 2337   K 3.6 01/15/2015 0911   CL 103 07/02/2021 2337   CO2 23 07/02/2021 2337   CO2 27 01/15/2015 0911   GLUCOSE 199 (H) 07/02/2021 2337   GLUCOSE 153 (H) 01/15/2015 0911   BUN 17 07/02/2021 2337   BUN 17.2 01/15/2015 0911   CREATININE 0.64 07/02/2021 2337   CREATININE 0.68 06/20/2019 1341   CREATININE 0.8 01/15/2015 0911   CALCIUM 8.8 (L) 07/02/2021 2337   CALCIUM 10.7 (H) 01/15/2015 0911   GFRNONAA >60 07/02/2021 2337   GFRAA >60 11/07/2018 2342    BNP    Component Value Date/Time   BNP 9.0 07/23/2015 0928     Imaging:  No results found.  Administration History     None          Latest Ref Rng & Units 02/04/2019    2:02 PM  PFT Results  FVC-Pre L 2.99   FVC-Predicted Pre % 82   FVC-Post L 3.10   FVC-Predicted Post % 85   Pre FEV1/FVC % % 81   Post FEV1/FCV % % 82   FEV1-Pre L 2.43   FEV1-Predicted Pre % 85   FEV1-Post L 2.53   DLCO uncorrected ml/min/mmHg 19.69   DLCO UNC% % 91   DLVA Predicted % 92   TLC L 5.74   TLC % Predicted % 110   RV % Predicted % 139     Lab Results  Component Value Date   NITRICOXIDE 23 10/19/2021        Assessment & Plan:   Asthma, mild persistent Mild exacerbation due to lack of maintenance therapy.  Will treat her with prednisone burst and get her restarted on ICS.  She does very well with Alvesco.  She has tried multiple different inhalers in the past without success.  Spoke with her insurance company who requested prior authorization which  was completed during her visit.  They felt confident that this would be approved given her failure of previous inhalers.  Advised her to let us know if she had any further difficulties.  Action plan in place.  Patient Instructions  Continue Albuterol inhaler 2 puffs or 3 mL neb every 6 hours as needed for shortness of breath or wheezing. Notify if symptoms persist despite rescue inhaler/neb use.  Continue Zyrtec 1 tab daily for allergies Continue montelukast 1 tab At bedtime for allergies/asthma Continue Requip 1 tab At bedtime   Restart Alvesco 2 puffs Twice daily. Brush tongue and rinse mouth afterwards  Given your symptoms, I am concerned that you may have sleep disordered breathing with sleep apnea. You will need a sleep study for further evaluation. Someone will contact you to schedule this.   We discussed how untreated sleep apnea puts an individual at risk for cardiac arrhthymias, pulm HTN, DM, stroke and increases their risk for daytime accidents. We also briefly reviewed treatment options including weight loss, side sleeping position, oral appliance, CPAP therapy or referral to ENT for possible surgical options  Use caution when driving and pull over if you become sleepy.  Follow up in 6-8 weeks with Dr. Wynona Neat or Florentina Addison Ellenora Talton,NP to go over sleep study results, or sooner, if needed     Obstructive sleep apnea History of sleep apnea.  Has significant daytime fatigue symptoms.  Her last sleep study was limited due to insomnia.  Given her symptoms and history, I think it is appropriate to move forward with home sleep study for further evaluation.  Orders placed today.  Cautioned on safe driving practices.  Reviewed risks of untreated sleep apnea and potential treatment options.  Healthy weight loss encouraged.    I spent 42 minutes of dedicated to the care of this patient on the date of this encounter to include pre-visit review of records, face-to-face time with the patient discussing  conditions above, post visit ordering of testing, clinical documentation with the electronic health record, making appropriate referrals as documented, and communicating necessary findings to members of the patients care team.  Noemi Chapel, NP 10/27/2022  Pt aware and understands NP's role.

## 2022-10-25 NOTE — Patient Instructions (Addendum)
Continue Albuterol inhaler 2 puffs or 3 mL neb every 6 hours as needed for shortness of breath or wheezing. Notify if symptoms persist despite rescue inhaler/neb use.  Continue Zyrtec 1 tab daily for allergies Continue montelukast 1 tab At bedtime for allergies/asthma Continue Requip 1 tab At bedtime   Restart Alvesco 2 puffs Twice daily. Brush tongue and rinse mouth afterwards  Given your symptoms, I am concerned that you may have sleep disordered breathing with sleep apnea. You will need a sleep study for further evaluation. Someone will contact you to schedule this.   We discussed how untreated sleep apnea puts an individual at risk for cardiac arrhthymias, pulm HTN, DM, stroke and increases their risk for daytime accidents. We also briefly reviewed treatment options including weight loss, side sleeping position, oral appliance, CPAP therapy or referral to ENT for possible surgical options  Use caution when driving and pull over if you become sleepy.  Follow up in 6-8 weeks with [name] or Alexa Addison Shaunda Tipping,NP to go over sleep study results, or sooner, if needed

## 2022-10-25 NOTE — Telephone Encounter (Signed)
Pharm team- can you check and see covered alternative for alvesco?  Pt has intolerance to dry powder inhalers   Thanks so much!

## 2022-10-26 ENCOUNTER — Other Ambulatory Visit: Payer: Self-pay

## 2022-10-26 NOTE — Telephone Encounter (Signed)
Per test claims it looks like Fluticasone/Flovent HFA is covered-however is not giving me a definitive answer due to the recent fill of a similar product. Patient may want to reach out to the plan.

## 2022-10-27 ENCOUNTER — Encounter: Payer: Self-pay | Admitting: Nurse Practitioner

## 2022-10-27 NOTE — Assessment & Plan Note (Signed)
Mild exacerbation due to lack of maintenance therapy.  Will treat her with prednisone burst and get her restarted on ICS.  She does very well with Alvesco.  She has tried multiple different inhalers in the past without success.  Spoke with her insurance company who requested prior authorization which was completed during her visit.  They felt confident that this would be approved given her failure of previous inhalers.  Advised her to let us know if she had any further difficulties.  Action plan in place.  Patient Instructions  Continue Albuterol inhaler 2 puffs or 3 mL neb every 6 hours as needed for shortness of breath or wheezing. Notify if symptoms persist despite rescue inhaler/neb use.  Continue Zyrtec 1 tab daily for allergies Continue montelukast 1 tab At bedtime for allergies/asthma Continue Requip 1 tab At bedtime   Restart Alvesco 2 puffs Twice daily. Brush tongue and rinse mouth afterwards  Given your symptoms, I am concerned that you may have sleep disordered breathing with sleep apnea. You will need a sleep study for further evaluation. Someone will contact you to schedule this.   We discussed how untreated sleep apnea puts an individual at risk for cardiac arrhthymias, pulm HTN, DM, stroke and increases their risk for daytime accidents. We also briefly reviewed treatment options including weight loss, side sleeping position, oral appliance, CPAP therapy or referral to ENT for possible surgical options  Use caution when driving and pull over if you become sleepy.  Follow up in 6-8 weeks with Dr. Wynona Neat or Florentina Addison Chavie Kolinski,NP to go over sleep study results, or sooner, if needed

## 2022-10-27 NOTE — Assessment & Plan Note (Signed)
History of sleep apnea.  Has significant daytime fatigue symptoms.  Her last sleep study was limited due to insomnia.  Given her symptoms and history, I think it is appropriate to move forward with home sleep study for further evaluation.  Orders placed today.  Cautioned on safe driving practices.  Reviewed risks of untreated sleep apnea and potential treatment options.  Healthy weight loss encouraged.

## 2022-11-14 ENCOUNTER — Encounter: Payer: Self-pay | Admitting: Pulmonary Disease

## 2022-11-15 NOTE — Telephone Encounter (Signed)
Please see previous encounter message. I responded back to the patient earlier today, 9/4 via MyChart on a different thread. I have been out of the office since 8/29. Thanks.

## 2022-11-29 ENCOUNTER — Other Ambulatory Visit: Payer: Self-pay | Admitting: Pulmonary Disease

## 2022-12-08 ENCOUNTER — Other Ambulatory Visit: Payer: Self-pay | Admitting: Pulmonary Disease

## 2022-12-13 ENCOUNTER — Other Ambulatory Visit: Payer: Self-pay | Admitting: Pulmonary Disease

## 2022-12-13 DIAGNOSIS — J3089 Other allergic rhinitis: Secondary | ICD-10-CM

## 2022-12-13 MED ORDER — MONTELUKAST SODIUM 10 MG PO TABS: 10.0000 mg | ORAL_TABLET | Freq: Every day | ORAL | 5 refills | Status: DC

## 2023-01-05 ENCOUNTER — Ambulatory Visit: Payer: Medicaid Other

## 2023-01-05 DIAGNOSIS — G4733 Obstructive sleep apnea (adult) (pediatric): Secondary | ICD-10-CM

## 2023-01-08 ENCOUNTER — Ambulatory Visit (HOSPITAL_BASED_OUTPATIENT_CLINIC_OR_DEPARTMENT_OTHER): Payer: Medicaid Other | Attending: Family Medicine

## 2023-01-10 ENCOUNTER — Telehealth: Payer: Self-pay | Admitting: Pulmonary Disease

## 2023-01-10 DIAGNOSIS — G4733 Obstructive sleep apnea (adult) (pediatric): Secondary | ICD-10-CM | POA: Diagnosis not present

## 2023-01-10 NOTE — Telephone Encounter (Signed)
Call patient  Sleep study result  Date of study: 01/07/2023  Impression: Mild obstructive sleep apnea with moderate oxygen desaturations  Recommendation: Options of treatment for mild obstructive sleep apnea will include  1.  CPAP therapy if there is significant daytime sleepiness or other comorbidities including history of CVA or cardiac disease  -If CPAP is chosen as an option of treatment auto titrating CPAP with a pressure setting of 5-15 will be appropriate  2.  Watchful waiting with emphasis on weight loss measures, sleep position modification to optimize lateral sleep, elevating the head of the bed by about 30 degrees may also help.  3.  An oral device may be fashioned for the treatment of mild sleep disordered breathing, will involve referral to dentist.  Follow-up as previously scheduled

## 2023-03-02 ENCOUNTER — Ambulatory Visit: Payer: Medicaid Other | Admitting: Nurse Practitioner

## 2023-03-02 ENCOUNTER — Encounter: Payer: Self-pay | Admitting: Nurse Practitioner

## 2023-03-02 VITALS — BP 122/82 | HR 83 | Ht 64.0 in | Wt 256.2 lb

## 2023-03-02 DIAGNOSIS — G4733 Obstructive sleep apnea (adult) (pediatric): Secondary | ICD-10-CM | POA: Diagnosis not present

## 2023-03-02 DIAGNOSIS — J4540 Moderate persistent asthma, uncomplicated: Secondary | ICD-10-CM

## 2023-03-02 DIAGNOSIS — Z6841 Body Mass Index (BMI) 40.0 and over, adult: Secondary | ICD-10-CM | POA: Diagnosis not present

## 2023-03-02 MED ORDER — BUDESONIDE-FORMOTEROL FUMARATE 80-4.5 MCG/ACT IN AERO: 1.0000 | INHALATION_SPRAY | Freq: Two times a day (BID) | RESPIRATORY_TRACT | 12 refills | Status: DC

## 2023-03-02 NOTE — Assessment & Plan Note (Addendum)
Difficulties with asthma management due to inhaler intolerance.  Not in acute exacerbation. She has tried multiple inhalers in the past.  Unable to tolerate any DPI.  Felt like the Alvesco 160 mcg was too much for her.  She does receive benefit from [place].  Will try her on low-dose Symbicort 80 mcg.  She would prefer to start with 1 puff twice daily.  Understands that 2 puffs twice daily is recommended dosing.  Educated on proper use.  Side effect profile reviewed.  Teach back performed.  Encouraged her to try using her neb treatments prior to bed to see if she gets better benefit from this.  Action plan in place.  Patient Instructions  Continue Albuterol inhaler 2 puffs or 3 mL neb every 6 hours as needed for shortness of breath or wheezing. Notify if symptoms persist despite rescue inhaler/neb use. Use neb at night to see if this helps  Continue Zyrtec 1 tab daily for allergies Continue montelukast 1 tab At bedtime for allergies/asthma Continue Requip 1 tab At bedtime   Start Symbicort 1 puff Twice daily. Brush tongue and rinse mouth afterwards   Start auto CPAP 5-15 cmH2O, DreamWear full face mask, heated humidity, every night, minimum of 4-6 hours a night.  Change equipment as directed. Wash your tubing with warm soap and water daily, hang to dry. Wash humidifier portion weekly. Use bottled, distilled water and change daily Be aware of reduced alertness and do not drive or operate heavy machinery if experiencing this or drowsiness.  Exercise encouraged, as tolerated. Healthy weight management discussed.  Avoid or decrease alcohol consumption and medications that make you more sleepy, if possible. Notify if persistent daytime sleepiness occurs even with consistent use of PAP therapy.   We discussed how untreated sleep apnea puts an individual at risk for cardiac arrhthymias, pulm HTN, DM, stroke and increases their risk for daytime accidents. We also briefly reviewed treatment options including  weight loss, side sleeping position, oral appliance, CPAP therapy or referral to ENT for possible surgical options  Change supplies... Every month Mask cushions and/or nasal pillows CPAP machine filters Every 3 months Mask frame (not including the headgear) CPAP tubing Every 6 months Mask headgear Chin strap (if applicable) Humidifier water tub   Follow up in 8 weeks with Dr. Wynona Neat or Katie Isiac Breighner,NP. If symptoms do not improve or worsen, please contact office for sooner follow up or seek emergency care.

## 2023-03-02 NOTE — Progress Notes (Signed)
@Patient  ID: [name], female    DOB: 06-25-1965, 57 y.o.   MRN: [id number]  Chief Complaint  Patient presents with   Follow-up    Home Sleep Study results.    Referring provider: Jeoffrey Massed, MD  HPI: 57 year old female, former smoker followed for asthma and OSA. She is a patient of Dr. Trena Platt and last seen in office on 10/25/2022 by [facility] NP. Past medical history significant for HTN, allergic rhinitis, DM, BPPV, RLS, HLD, ADHD, obesity, hx of breast cancer stage IA.   TEST/EVENTS:  07/23/2015 CTA chest: no evidence of PE. There are subcm mediastinaly lymph nodes. There is some mild scarring in the lateral left base. No evidence of parenchymal lung edema or consolidation. Hepatic steatosis.  02/04/2019 PFT: FVC 82, FEV1 85, ratio 82, TLC 110, DLCO 91. No significant BD; did have midflow reversibility  06/27/2021 CXR 2 view: both lungs are clear  11/20/2021 split-night sleep study: Negative for sleep apnea but suboptimal due to insomnia.  Sleep onset and sleep maintenance insomnia.  Periodic limb movement and restless legs. 01/04/2022 LDCT chest lung cancer screen: Small hiatal hernia.  Central airways are patent.  Linear opacity of left upper lobe, likely scarring or atelectasis.  Lung RADS 1.  Atherosclerosis. 01/07/2023 HST: AHI 7.3/h; SpO2 low 82%, average 89%  06/27/2021: OV with Dr. Wynona Neat. Hx of asthma; using rescue inhaler frequently. Previously on Symbicort but not on any maintenance inhalers. Unable to tolerate DPIs. She has also had trouble with sleeping and RLS; not wearing CPAP. Progressive weight gain. HST ordered. CXR clear.   10/19/2021: OV with Derwood Becraft NP for follow up. She reports that she was doing well on the AirDuo and breathing was stable up until June. She unfortunately lost one of her best friends when they unexpectedly passed away. She started smoking again due to the emotional stress and feels that after this, her breathing has not been as great. She  has quit smoking since but hasn't noticed much difference. Still having trouble feeling like she can expel all the air from her lungs when she breathes out and has some chest tightness at times. Symptoms are worse at bedtime. Uses her albuterol neb every evening and feels as though this helps. She denies cough, wheezing, chest pain, orthopnea, PND, leg swelling. She uses her AirDuo twice daily and takes zyrtec daily for allergies.   Never completed HST; states that insurance denied this. Still having daytime fatigue and snoring at night. Denies drowsy driving, sleep parasomnias, or morning headaches.   01/21/2022: OV with Dr. Wynona Neat.  No significant abnormality found on CT.  Does have a history of OSA and not on CPAP currently.  History of nonrestorative sleep and insomnia.  She also has restless leg for which she is on Requip.  History of asthma and currently on Flovent.  Changed to Alvesco.  Has not tolerated powdered inhalers in the past.  Still having symptoms that is concerning for sleep disordered breathing with daytime sleepiness and tiredness.  Recent sleep study was limited due to insomnia.  Encouraged to work on weight loss measures.  Yearly CT.  10/25/2022: OV with [name] NP for follow-up.  Her breathing has been giving her a hard time recently.  She ran out of her inhaler sometime around June and has been having trouble since.  Had an issue with insurance and was switched from [place] to [place] and then back to [place] again.  She was told by her pharmacy that she  would need to go on an alternative prescription and that [place] would not take another prior authorization for the Alvesco.  She feels that this has worked the best for her out of anything regarding her asthma.  She has noticed that she gets more short winded with cleaning and then she feels like she has trouble getting her breath out, which seems to be worse at night.  Does have some wheezing.  No significant cough, chest congestion,  fevers or chills. She still is having a lot of trouble with daytime fatigue, loud snoring. Feels like her sleep is poor quality.  No issues with drowsy driving or morning headaches.  Last sleep study in the lab was limited due to insomnia.  She would prefer to do a home sleep study if at all possible.  Does not feel like she would be able to do another study in the lab.  03/02/2023: Today - follow up Patient presents today for follow-up after undergoing home sleep study that revealed mild sleep apnea.  She has a lot of issues with her sleep at night due to her breathing.  Feels like she has difficulties laying flat.  Will sometimes wake up winded.  Feels very fatigued during the day.  She also cares for her mother, so her sleep hours fluctuate based on this.  No issues with drowsy driving, sleep parasomnia/paralysis. She was using the Alvesco after her last visit.  Felt like 2 puffs twice daily was too much for her so she had decreased to 1 puff in the morning.  She has since stopped this again.  Feels like it just does not work that well for her and causes some side effects.  She also like her breathing will get worse at night with it.  She is using the albuterol frequently.  Usually uses it a couple times during the day and then multiple times at night.  Does feel like this helps with her breathing.  She has not tried using her breathing treatments prior to bedtime.  Has not had to have any steroids or antibiotics since her last visit.  No hospitalizations.  Notices some wheezing at night but nothing during the day.  No significant cough.  No lower extremity swelling.  Allergies  Allergen Reactions   Metoprolol Other (See Comments)    REACTION: " chest pain"   Sulfa Antibiotics Nausea And Vomiting   Verapamil Hcl Er Hypertension    Increased HR, blood pressure Makes breathing worse    Immunization History  Administered Date(s) Administered   Hep B, Unspecified 07/16/2001   Hepatitis A  11/22/2005   Hepatitis A, Adult 11/22/2005   Hepatitis B 07/16/2001   Influenza Split 01/10/2006   Influenza, Seasonal, Injecte, Preservative Fre 01/11/2017, 03/11/2020   Influenza,inj,Quad PF,6+ Mos 12/10/2012, 01/11/2017, 12/13/2017, 12/09/2018, 03/11/2020, 11/30/2021   PFIZER(Purple Top)SARS-COV-2 Vaccination 06/17/2019, 07/15/2019   Pneumococcal Polysaccharide-23 12/10/2012   Pneumococcal-Unspecified 12/10/2012   Td 04/17/2003   Tdap 04/21/2013   Typhoid Live 11/22/2005   Yellow Fever 11/22/2005   Zoster Recombinant(Shingrix) 10/03/2017, 12/19/2017    Past Medical History:  Diagnosis Date   Allergic rhinitis    Anxiety and depression    Attention deficit disorder (ADD) in adult    Atypical chest pain    Myocardial perf imaging 04/04/16: Low risk stress nuclear study with prior inferior infarct and mild peri-infarct ischemia; EF 55 with mild hypokinesis of the inferior wall.  Cath normal, so stress test was FALSE POS.   Cholelithiasis 03/2016  u/s: no signs of cholecystitis   Fibromyalgia syndrome    GERD (gastroesophageal reflux disease)    History of breast cancer 2012   left mastectomy and chemotherapy   History of chemotherapy    2012 completed   Hyperlipidemia    Hypertension    Moderate persistent asthma    dx'd age 59 (Dr. Shelle Iron) hx of noncompliance with controller therapy due to cost. PFTs 01/2019->mild small airways obst dz, no resp to bronchodil. Trelegy started 02/2019 by Dr. Wynona Neat. Breztri helpful as of 2021.   Morbid obesity (HCC)    NASH (nonalcoholic steatohepatitis) 02/2010   u/s confirmed 02/2010 (hx of transaminitis).  Repeat u/s 03/2016 showed no sign of cirrhosis or liver mass, just moderate hepatic steatosis. AST/ALT stable 06/2019   Neuropathy of both upper extremities    and both feet   OSA (obstructive sleep apnea)    Dr Shelle Iron initially: pt couldn't tolerate CPAP.  Re-eval 12/2017 re-confirmed severe OSA, CPAP at 11 recommended.   Ovarian mass     resected 07/12/10   Recurrent upper respiratory infection (URI)    RLS (restless legs syndrome)    Type II or unspecified type diabetes mellitus without mention of complication, uncontrolled 05/15/2012    Tobacco History: Social History   Tobacco Use  Smoking Status Former   Current packs/day: 0.00   Average packs/day: 0.5 packs/day for 24.0 years (12.0 ttl pk-yrs)   Types: Cigarettes   Start date: 08/16/1994   Quit date: 08/16/2018   Years since quitting: 4.5   Passive exposure: Never  Smokeless Tobacco Never   Counseling given: Not Answered   Outpatient Medications Prior to Visit  Medication Sig Dispense Refill   albuterol (PROVENTIL) (2.5 MG/3ML) 0.083% nebulizer solution USE THREE MILLILITERS VIA NEBULIZATION BY MOUTH EVERY 6 HOURS AS NEEDED FOR WHEEZING OR FOR SHORTNESS OF BREATH 75 mL 5   albuterol (VENTOLIN HFA) 108 (90 Base) MCG/ACT inhaler INHALE ONE PUFF BY MOUTH EVERY 6 HOURS AS NEEDED FOR WHEEZING OR FOR SHORTNESS OF BREATH 18 g 1   amphetamine-dextroamphetamine (ADDERALL) 20 MG tablet Take 1 tablet (20 mg total) by mouth 2 (two) times daily. 60 tablet 0   atorvastatin (LIPITOR) 80 MG tablet TAKE ONE TABLET BY MOUTH DAILY 30 tablet 5   cetirizine (ZYRTEC) 10 MG tablet Take 10 mg by mouth daily.     citalopram (CELEXA) 40 MG tablet TAKE ONE TABLET BY MOUTH DAILY 90 tablet 0   clonazePAM (KLONOPIN) 0.5 MG tablet TAKE ONE TABLET BY MOUTH TWICE DAILY AS NEEDED 60 tablet 3   fluticasone (CUTIVATE) 0.05 % cream Apply topically 2 (two) times daily. 60 g 1   ibuprofen (ADVIL,MOTRIN) 200 MG tablet Take 800 mg by mouth as needed for fever or moderate pain.      MAGNESIUM OXIDE PO Take 800 mg by mouth daily.     montelukast (SINGULAIR) 10 MG tablet Take 1 tablet (10 mg total) by mouth at bedtime. 30 tablet 5   Olopatadine-Mometasone (RYALTRIS) 665-25 MCG/ACT SUSP Place 1-2 sprays into the nose in the morning and at bedtime. 29 g 5   pregabalin (LYRICA) 75 MG capsule Take 75 mg by  mouth.     rOPINIRole (REQUIP) 1 MG tablet TAKE ONE TABLET BY MOUTH at bedtime 30 tablet 3   Semaglutide,0.25 or 0.5MG /DOS, 2 MG/3ML SOPN Inject 0.5 mg into the skin once a week.     triamterene-hydrochlorothiazide (MAXZIDE-25) 37.5-25 MG tablet Take 1 tablet by mouth daily. 90 tablet 3   ciclesonide (  ALVESCO) 160 MCG/ACT inhaler Inhale 2 puffs into the lungs 2 (two) times daily. 1 each 11   No facility-administered medications prior to visit.     Review of Systems:   Constitutional: No weight loss or gain, night sweats, fevers, chills, or lassitude. +excessive daytime fatigue HEENT: No headaches, difficulty swallowing, tooth/dental problems, or sore throat. No sneezing, itching, ear ache, nasal congestion, or post nasal drip CV:  +orthopnea, PND. No chest pain, swelling in lower extremities, anasarca, dizziness, palpitations, syncope Resp: +shortness of breath with exertion; chest tightness; wheezing; snoring. No excess mucus or change in color of mucus. No productive or non-productive. No hemoptysis. No wheezing.  No chest wall deformity GI:  No heartburn, indigestion, abdominal pain, nausea, vomiting, diarrhea, change in bowel habits, loss of appetite, bloody stools.  Skin: No rash, lesions, ulcerations MSK:  No joint pain or swelling.   Neuro: No dizziness or lightheadedness.  Psych: No depression or anxiety. Mood stable. +sleep disturbance    Physical Exam:  BP 122/82 (BP Location: Right Arm, Patient Position: Sitting, Cuff Size: Large)   Pulse 83   Ht 5\' 4"  (1.626 m)   Wt 256 lb 3.2 oz (116.2 kg)   LMP 07/14/2010 Comment: ovaries removed 2012  SpO2 95%   BMI 43.98 kg/m   GEN: Pleasant, interactive, well-appearing; obese; in no acute distress. HEENT:  Normocephalic and atraumatic. PERRLA. Sclera white. Nasal turbinates pink, moist and patent bilaterally. No rhinorrhea present. Oropharynx pink and moist, without exudate or edema. No lesions, ulcerations, or postnasal drip.   Mallampati IV NECK:  Supple w/ fair ROM. No JVD present. Normal carotid impulses w/o bruits. Thyroid symmetrical with no goiter or nodules palpated. No lymphadenopathy.   CV: RRR, no m/r/g, no peripheral edema. Pulses intact, +2 bilaterally. No cyanosis, pallor or clubbing. PULMONARY:  Unlabored, regular breathing.  End expiratory wheeze left lower lobe, cleared with deep inspiration.  Otherwise clear bilaterally without wheezes rales rhonchi. No accessory muscle use. No dullness to percussion. GI: BS present and normoactive. Soft, non-tender to palpation. No organomegaly or masses detected.  MSK: No erythema, warmth or tenderness. Cap refil <2 sec all extrem. No deformities or joint swelling noted.  Neuro: A/Ox3. No focal deficits noted.   Skin: Warm, no lesions or rashe Psych: Normal affect and behavior. Judgement and thought content appropriate.     Lab Results:  CBC    Component Value Date/Time   WBC 9.0 10/19/2021 1006   RBC 4.84 10/19/2021 1006   HGB 15.0 10/19/2021 1006   HGB 14.0 01/15/2015 0911   HCT 44.4 10/19/2021 1006   HCT 40.2 01/15/2015 0911   PLT 317.0 10/19/2021 1006   PLT 277 01/15/2015 0911   MCV 91.7 10/19/2021 1006   MCV 91.3 01/15/2015 0911   MCH 31.4 07/02/2021 2337   MCHC 33.8 10/19/2021 1006   RDW 12.7 10/19/2021 1006   RDW 12.8 01/15/2015 0911   LYMPHSABS 3.8 10/19/2021 1006   LYMPHSABS 3.0 01/15/2015 0911   MONOABS 0.8 10/19/2021 1006   MONOABS 0.6 01/15/2015 0911   EOSABS 0.4 10/19/2021 1006   EOSABS 0.3 01/15/2015 0911   BASOSABS 0.1 10/19/2021 1006   BASOSABS 0.1 01/15/2015 0911    BMET    Component Value Date/Time   NA 134 (L) 07/02/2021 2337   NA 141 01/15/2015 0911   K 4.4 07/02/2021 2337   K 3.6 01/15/2015 0911   CL 103 07/02/2021 2337   CO2 23 07/02/2021 2337   CO2 27 01/15/2015 0911  GLUCOSE 199 (H) 07/02/2021 2337   GLUCOSE 153 (H) 01/15/2015 0911   BUN 17 07/02/2021 2337   BUN 17.2 01/15/2015 0911   CREATININE 0.64  07/02/2021 2337   CREATININE 0.68 06/20/2019 1341   CREATININE 0.8 01/15/2015 0911   CALCIUM 8.8 (L) 07/02/2021 2337   CALCIUM 10.7 (H) 01/15/2015 0911   GFRNONAA >60 07/02/2021 2337   GFRAA >60 11/07/2018 2342    BNP    Component Value Date/Time   BNP 9.0 07/23/2015 0928     Imaging:  No results found.  Administration History     None          Latest Ref Rng & Units 02/04/2019    2:02 PM  PFT Results  FVC-Pre L 2.99   FVC-Predicted Pre % 82   FVC-Post L 3.10   FVC-Predicted Post % 85   Pre FEV1/FVC % % 81   Post FEV1/FCV % % 82   FEV1-Pre L 2.43   FEV1-Predicted Pre % 85   FEV1-Post L 2.53   DLCO uncorrected ml/min/mmHg 19.69   DLCO UNC% % 91   DLVA Predicted % 92   TLC L 5.74   TLC % Predicted % 110   RV % Predicted % 139     Lab Results  Component Value Date   NITRICOXIDE 23 10/19/2021        Assessment & Plan:   Moderate persistent asthma without complication Difficulties with asthma management due to inhaler intolerance.  Not in acute exacerbation. She has tried multiple inhalers in the past.  Unable to tolerate any DPI.  Felt like the Alvesco 160 mcg was too much for her.  She does receive benefit from [place].  Will try her on low-dose Symbicort 80 mcg.  She would prefer to start with 1 puff twice daily.  Understands that 2 puffs twice daily is recommended dosing.  Educated on proper use.  Side effect profile reviewed.  Teach back performed.  Encouraged her to try using her neb treatments prior to bed to see if she gets better benefit from this.  Action plan in place.  Patient Instructions  Continue Albuterol inhaler 2 puffs or 3 mL neb every 6 hours as needed for shortness of breath or wheezing. Notify if symptoms persist despite rescue inhaler/neb use. Use neb at night to see if this helps  Continue Zyrtec 1 tab daily for allergies Continue montelukast 1 tab At bedtime for allergies/asthma Continue Requip 1 tab At bedtime   Start Symbicort 1  puff Twice daily. Brush tongue and rinse mouth afterwards   Start auto CPAP 5-15 cmH2O, DreamWear full face mask, heated humidity, every night, minimum of 4-6 hours a night.  Change equipment as directed. Wash your tubing with warm soap and water daily, hang to dry. Wash humidifier portion weekly. Use bottled, distilled water and change daily Be aware of reduced alertness and do not drive or operate heavy machinery if experiencing this or drowsiness.  Exercise encouraged, as tolerated. Healthy weight management discussed.  Avoid or decrease alcohol consumption and medications that make you more sleepy, if possible. Notify if persistent daytime sleepiness occurs even with consistent use of PAP therapy.   We discussed how untreated sleep apnea puts an individual at risk for cardiac arrhthymias, pulm HTN, DM, stroke and increases their risk for daytime accidents. We also briefly reviewed treatment options including weight loss, side sleeping position, oral appliance, CPAP therapy or referral to ENT for possible surgical options  Change supplies... Every month Mask  cushions and/or nasal pillows CPAP machine filters Every 3 months Mask frame (not including the headgear) CPAP tubing Every 6 months Mask headgear Chin strap (if applicable) Humidifier water tub   Follow up in 8 weeks with Dr. Wynona Neat or Katie Jinny Sweetland,NP. If symptoms do not improve or worsen, please contact office for sooner follow up or seek emergency care.    Obstructive sleep apnea Mild sleep apnea with significant symptom burden.  She does have mild oxygen desaturations.  Reviewed risks of untreated sleep apnea.  Given symptoms, shared decision to move forward with CPAP.  She has been on CPAP before which has helped.  Orders placed for auto CPAP 5-15 cmH2O, mask of choice and heated humidity.  Educated on proper use/care of device. Healthy weight loss encouraged. Aware of safe driving practices.   Obesity, Class III, BMI 40-49.9  (morbid obesity) Healthy weight loss encouraged. BMI 43.      I spent 35 minutes of dedicated to the care of this patient on the date of this encounter to include pre-visit review of records, face-to-face time with the patient discussing conditions above, post visit ordering of testing, clinical documentation with the electronic health record, making appropriate referrals as documented, and communicating necessary findings to members of the patients care team.  Noemi Chapel, NP 03/02/2023  Pt aware and understands NP's role.

## 2023-03-02 NOTE — Assessment & Plan Note (Signed)
Healthy weight loss encouraged. BMI 43.

## 2023-03-02 NOTE — Patient Instructions (Addendum)
Continue Albuterol inhaler 2 puffs or 3 mL neb every 6 hours as needed for shortness of breath or wheezing. Notify if symptoms persist despite rescue inhaler/neb use. Use neb at night to see if this helps  Continue Zyrtec 1 tab daily for allergies Continue montelukast 1 tab At bedtime for allergies/asthma Continue Requip 1 tab At bedtime   Start Symbicort 1 puff Twice daily. Brush tongue and rinse mouth afterwards   Start auto CPAP 5-15 cmH2O, DreamWear full face mask, heated humidity, every night, minimum of 4-6 hours a night.  Change equipment as directed. Wash your tubing with warm soap and water daily, hang to dry. Wash humidifier portion weekly. Use bottled, distilled water and change daily Be aware of reduced alertness and do not drive or operate heavy machinery if experiencing this or drowsiness.  Exercise encouraged, as tolerated. Healthy weight management discussed.  Avoid or decrease alcohol consumption and medications that make you more sleepy, if possible. Notify if persistent daytime sleepiness occurs even with consistent use of PAP therapy.   We discussed how untreated sleep apnea puts an individual at risk for cardiac arrhthymias, pulm HTN, DM, stroke and increases their risk for daytime accidents. We also briefly reviewed treatment options including weight loss, side sleeping position, oral appliance, CPAP therapy or referral to ENT for possible surgical options  Change supplies... Every month Mask cushions and/or nasal pillows CPAP machine filters Every 3 months Mask frame (not including the headgear) CPAP tubing Every 6 months Mask headgear Chin strap (if applicable) Humidifier water tub   Follow up in 8 weeks with Dr. Wynona Neat or Katie Orene Abbasi,NP. If symptoms do not improve or worsen, please contact office for sooner follow up or seek emergency care.

## 2023-03-02 NOTE — Assessment & Plan Note (Signed)
Mild sleep apnea with significant symptom burden.  She does have mild oxygen desaturations.  Reviewed risks of untreated sleep apnea.  Given symptoms, shared decision to move forward with CPAP.  She has been on CPAP before which has helped.  Orders placed for auto CPAP 5-15 cmH2O, mask of choice and heated humidity.  Educated on proper use/care of device. Healthy weight loss encouraged. Aware of safe driving practices.

## 2023-04-23 ENCOUNTER — Other Ambulatory Visit: Payer: Self-pay | Admitting: Pulmonary Disease

## 2023-04-23 NOTE — Telephone Encounter (Signed)
 When she's wearing CPAP, she's controlled well so it should not be making her more tired unless something is causing her to wake up frequently. She doesn't seem to be having a lot of leaks either. I can try adjusting her settings and tightening them to auto 9-12 cmH2O. See how this works and keep us  posted. Thanks!

## 2023-04-23 NOTE — Telephone Encounter (Signed)
 Katie, please advise on pt email - I printed DL for you. She has been using machine only 20 days.   Hi! When I'm using my Cpap when I wake up I seem to be more tired than when I don't. Do you think it may need to be calibrated?

## 2023-04-25 ENCOUNTER — Other Ambulatory Visit (HOSPITAL_BASED_OUTPATIENT_CLINIC_OR_DEPARTMENT_OTHER): Payer: Self-pay

## 2023-04-25 DIAGNOSIS — G4733 Obstructive sleep apnea (adult) (pediatric): Secondary | ICD-10-CM

## 2023-04-30 ENCOUNTER — Encounter: Payer: Self-pay | Admitting: Nurse Practitioner

## 2023-04-30 ENCOUNTER — Ambulatory Visit (INDEPENDENT_AMBULATORY_CARE_PROVIDER_SITE_OTHER): Payer: Medicaid Other | Admitting: Nurse Practitioner

## 2023-04-30 VITALS — BP 136/74 | HR 88 | Temp 98.3°F | Ht 64.0 in | Wt 248.2 lb

## 2023-04-30 DIAGNOSIS — G4733 Obstructive sleep apnea (adult) (pediatric): Secondary | ICD-10-CM | POA: Diagnosis not present

## 2023-04-30 DIAGNOSIS — J4530 Mild persistent asthma, uncomplicated: Secondary | ICD-10-CM | POA: Diagnosis not present

## 2023-04-30 NOTE — Assessment & Plan Note (Signed)
 OSA on CPAP. Had difficulties with previous pressure settings. We adjusted her to 9-13 cmH2O on 2/11. She feels this is working better for her. Encouraged to work on increasing usage and utilize nightly. Aware of risks of untreated OSA and treatment options. Healthy weight loss encouraged. Understands proper care/use. Aware of safe driving practices. Reassess use at follow up.

## 2023-04-30 NOTE — Patient Instructions (Signed)
 Continue Albuterol inhaler 2 puffs or 3 mL neb every 6 hours as needed for shortness of breath or wheezing. Notify if symptoms persist despite rescue inhaler/neb use. Use neb at night to see if this helps  Continue Zyrtec 1 tab daily for allergies Continue montelukast 1 tab At bedtime for allergies/asthma Continue Requip 1 tab At bedtime  Continue Symbicort 1-2 puff Twice daily. Brush tongue and rinse mouth afterwards    Continue CPAP, every night, minimum of 4-6 hours a night.  Change equipment as directed. Wash your tubing with warm soap and water daily, hang to dry. Wash humidifier portion weekly. Use bottled, distilled water and change daily Be aware of reduced alertness and do not drive or operate heavy machinery if experiencing this or drowsiness.  Exercise encouraged, as tolerated. Healthy weight management discussed.  Avoid or decrease alcohol consumption and medications that make you more sleepy, if possible. Notify if persistent daytime sleepiness occurs even with consistent use of PAP therapy.   We discussed how untreated sleep apnea puts an individual at risk for cardiac arrhthymias, pulm HTN, DM, stroke and increases their risk for daytime accidents.    Change supplies... Every month Mask cushions and/or nasal pillows CPAP machine filters Every 3 months Mask frame (not including the headgear) CPAP tubing Every 6 months Mask headgear Chin strap (if applicable) Humidifier water tub   Follow up 4/11 at 1:30 pm via virtual visit with Katie Camreigh Michie,NP. If symptoms do not improve or worsen, please contact office for sooner follow up or seek emergency care.

## 2023-04-30 NOTE — Progress Notes (Signed)
 @Patient  ID: [name], female    DOB: 15-Mar-1965, 58 y.o.   MRN: [id number]  Chief Complaint  Patient presents with   Follow-up    Referring provider: Roe Rutherford, NP  HPI: 58 year old female, former smoker followed for asthma and OSA. She is a patient of Dr. Trena Hill and last seen in office on 03/02/2023 by Alexa Joseph [facility] NP. Past medical history significant for HTN, allergic rhinitis, DM, BPPV, RLS, HLD, ADHD, obesity, hx of breast cancer stage IA.   TEST/EVENTS:  07/23/2015 CTA chest: no evidence of PE. There are subcm mediastinaly lymph nodes. There is some mild scarring in the lateral left base. No evidence of parenchymal lung edema or consolidation. Hepatic steatosis.  02/04/2019 PFT: FVC 82, FEV1 85, ratio 82, TLC 110, DLCO 91. No significant BD; did have midflow reversibility  06/27/2021 CXR 2 view: both lungs are clear  11/20/2021 split-night sleep study: Negative for sleep apnea but suboptimal due to insomnia.  Sleep onset and sleep maintenance insomnia.  Periodic limb movement and restless legs. 01/04/2022 LDCT chest lung cancer screen: Small hiatal hernia.  Central airways are patent.  Linear opacity of left upper lobe, likely scarring or atelectasis.  Lung RADS 1.  Atherosclerosis. 01/07/2023 HST: AHI 7.3/h; SpO2 low 82%, average 89%  06/27/2021: OV with [name]. Hx of asthma; using rescue inhaler frequently. Previously on Symbicort but not on any maintenance inhalers. Unable to tolerate DPIs. She has also had trouble with sleeping and RLS; not wearing CPAP. Progressive weight gain. HST ordered. CXR clear.   10/19/2021: OV with Alexa Glanz NP for follow up. She reports that she was doing well on the AirDuo and breathing was stable up until June. She unfortunately lost one of her best friends when they unexpectedly passed away. She started smoking again due to the emotional stress and feels that after this, her breathing has not been as great. She has quit smoking since but  hasn't noticed much difference. Still having trouble feeling like she can expel all the air from her lungs when she breathes out and has some chest tightness at times. Symptoms are worse at bedtime. Uses her albuterol neb every evening and feels as though this helps. She denies cough, wheezing, chest pain, orthopnea, PND, leg swelling. She uses her AirDuo twice daily and takes zyrtec daily for allergies.   Never completed HST; states that insurance denied this. Still having daytime fatigue and snoring at night. Denies drowsy driving, sleep parasomnias, or morning headaches.   01/21/2022: OV with [name].  No significant abnormality found on CT.  Does have a history of OSA and not on CPAP currently.  History of nonrestorative sleep and insomnia.  She also has restless leg for which she is on Requip.  History of asthma and currently on Flovent.  Changed to Alvesco.  Has not tolerated powdered inhalers in the past.  Still having symptoms that is concerning for sleep disordered breathing with daytime sleepiness and tiredness.  Recent sleep study was limited due to insomnia.  Encouraged to work on weight loss measures.  Yearly CT.  10/25/2022: OV with Alexa Hoadley NP for follow-up.  Her breathing has been giving her a hard time recently.  She ran out of her inhaler sometime around June and has been having trouble since.  Had an issue with insurance and was switched from [place] to [place] and then back to [place] again.  She was told by her pharmacy that she would need to go on an alternative prescription  and that [place] would not take another prior authorization for the Alvesco.  She feels that this has worked the best for her out of anything regarding her asthma.  She has noticed that she gets more short winded with cleaning and then she feels like she has trouble getting her breath out, which seems to be worse at night.  Does have some wheezing.  No significant cough, chest congestion, fevers or chills. She still is  having a lot of trouble with daytime fatigue, loud snoring. Feels like her sleep is poor quality.  No issues with drowsy driving or morning headaches.  Last sleep study in the lab was limited due to insomnia.  She would prefer to do a home sleep study if at all possible.  Does not feel like she would be able to do another study in the lab.  03/02/2023: OV with Alexa Manganiello NP for follow-up after undergoing home sleep study that revealed mild sleep apnea.  She has a lot of issues with her sleep at night due to her breathing.  Feels like she has difficulties laying flat.  Will sometimes wake up winded.  Feels very fatigued during the day.  She also cares for her mother, so her sleep hours fluctuate based on this.  No issues with drowsy driving, sleep parasomnia/paralysis. She was using the Alvesco after her last visit.  Felt like 2 puffs twice daily was too much for her so she had decreased to 1 puff in the morning.  She has since stopped this again.  Feels like it just does not work that well for her and causes some side effects.  She also like her breathing will get worse at night with it.  She is using the albuterol frequently.  Usually uses it a couple times during the day and then multiple times at night.  Does feel like this helps with her breathing.  She has not tried using her breathing treatments prior to bedtime.  Has not had to have any steroids or antibiotics since her last visit.  No hospitalizations.  Notices some wheezing at night but nothing during the day.  No significant cough.  No lower extremity swelling.  04/30/2023: Today - follow up Patient presents today for follow-up.  Using Symbicort twice daily.  She has been tolerating this well.  Has not caused any issues with voice hoarseness or irritation.  Does feel like it has been helping her breathing.  Has not been coughing very much.  Not noticing any wheezing.  Nighttime feels better as well.  She was sick about a week or 2 ago but has felt back to  normal since then.  She did not wear her CPAP the nights that she was sick.  She did restart therapy and feels like the decreased pressure settings are helping.  Seems to be tolerating the CPAP better now.  Denies any issues with drowsy driving, morning headaches or sleep parasomnia/paralysis.  03/28/2023-04/26/2023: CPAP 9-12 cmH2O 16/30 days; 33% >4 hr; average use 6 h 1 min Pressure 95th 12 Leaks 95th 10.1 AHI 1.4  Allergies  Allergen Reactions   Metoprolol Other (See Comments)    REACTION: " chest pain"   Sulfa Antibiotics Nausea And Vomiting   Verapamil Hcl Er Hypertension    Increased HR, blood pressure Makes breathing worse    Immunization History  Administered Date(s) Administered   Hep B, Unspecified 07/16/2001   Hepatitis A 11/22/2005   Hepatitis A, Adult 11/22/2005   Hepatitis B 07/16/2001  Influenza Split 01/10/2006   Influenza, Seasonal, Injecte, Preservative Fre 01/11/2017, 03/11/2020   Influenza,inj,Quad PF,6+ Mos 12/10/2012, 01/11/2017, 12/13/2017, 12/09/2018, 03/11/2020, 11/30/2021   PFIZER(Purple Top)SARS-COV-2 Vaccination 06/17/2019, 07/15/2019   Pneumococcal Polysaccharide-23 12/10/2012   Pneumococcal-Unspecified 12/10/2012   Td 04/17/2003   Tdap 04/21/2013   Typhoid Live 11/22/2005   Yellow Fever 11/22/2005   Zoster Recombinant(Shingrix) 10/03/2017, 12/19/2017    Past Medical History:  Diagnosis Date   Allergic rhinitis    Anxiety and depression    Attention deficit disorder (ADD) in adult    Atypical chest pain    Myocardial perf imaging 04/04/16: Low risk stress nuclear study with prior inferior infarct and mild peri-infarct ischemia; EF 55 with mild hypokinesis of the inferior wall.  Cath normal, so stress test was FALSE POS.   Cholelithiasis 03/2016   u/s: no signs of cholecystitis   Fibromyalgia syndrome    GERD (gastroesophageal reflux disease)    History of breast cancer 2012   left mastectomy and chemotherapy   History of chemotherapy     2012 completed   Hyperlipidemia    Hypertension    Moderate persistent asthma    dx'd age 27 (Dr. Shelle Iron) hx of noncompliance with controller therapy due to cost. PFTs 01/2019->mild small airways obst dz, no resp to bronchodil. Trelegy started 02/2019 by [name]. Breztri helpful as of 2021.   Morbid obesity (HCC)    NASH (nonalcoholic steatohepatitis) 02/2010   u/s confirmed 02/2010 (hx of transaminitis).  Repeat u/s 03/2016 showed no sign of cirrhosis or liver mass, just moderate hepatic steatosis. AST/ALT stable 06/2019   Neuropathy of both upper extremities    and both feet   OSA (obstructive sleep apnea)    Dr Shelle Iron initially: pt couldn't tolerate CPAP.  Re-eval 12/2017 re-confirmed severe OSA, CPAP at 11 recommended.   Ovarian mass    resected 07/12/10   Recurrent upper respiratory infection (URI)    RLS (restless legs syndrome)    Type II or unspecified type diabetes mellitus without mention of complication, uncontrolled 05/15/2012    Tobacco History: Social History   Tobacco Use  Smoking Status Former   Current packs/day: 0.00   Average packs/day: 0.5 packs/day for 24.0 years (12.0 ttl pk-yrs)   Types: Cigarettes   Start date: 08/16/1994   Quit date: 08/16/2018   Years since quitting: 4.7   Passive exposure: Never  Smokeless Tobacco Never   Counseling given: Not Answered   Outpatient Medications Prior to Visit  Medication Sig Dispense Refill   albuterol (PROVENTIL) (2.5 MG/3ML) 0.083% nebulizer solution USE THREE MILLILITERS VIA NEBULIZATION BY MOUTH EVERY 6 HOURS AS NEEDED FOR WHEEZING OR FOR SHORTNESS OF BREATH 75 mL 5   albuterol (VENTOLIN HFA) 108 (90 Base) MCG/ACT inhaler INHALE ONE PUFF BY MOUTH EVERY 6 HOURS AS NEEDED FOR WHEEZING OR FOR SHORTNESS OF BREATH 18 g 1   amphetamine-dextroamphetamine (ADDERALL) 20 MG tablet Take 1 tablet (20 mg total) by mouth 2 (two) times daily. 60 tablet 0   atorvastatin (LIPITOR) 80 MG tablet TAKE ONE TABLET BY MOUTH DAILY 30 tablet  5   budesonide-formoterol (SYMBICORT) 80-4.5 MCG/ACT inhaler Inhale 1 puff into the lungs 2 (two) times daily. 1 each 12   cetirizine (ZYRTEC) 10 MG tablet Take 10 mg by mouth daily.     citalopram (CELEXA) 40 MG tablet TAKE ONE TABLET BY MOUTH DAILY 90 tablet 0   clonazePAM (KLONOPIN) 0.5 MG tablet TAKE ONE TABLET BY MOUTH TWICE DAILY AS NEEDED 60 tablet 3  fluticasone (CUTIVATE) 0.05 % cream Apply topically 2 (two) times daily. 60 g 1   ibuprofen (ADVIL,MOTRIN) 200 MG tablet Take 800 mg by mouth as needed for fever or moderate pain.      MAGNESIUM OXIDE PO Take 800 mg by mouth daily.     montelukast (SINGULAIR) 10 MG tablet Take 1 tablet (10 mg total) by mouth at bedtime. 30 tablet 5   Olopatadine-Mometasone (RYALTRIS) 665-25 MCG/ACT SUSP Place 1-2 sprays into the nose in the morning and at bedtime. 29 g 5   pregabalin (LYRICA) 75 MG capsule Take 75 mg by mouth.     rOPINIRole (REQUIP) 1 MG tablet TAKE ONE TABLET BY MOUTH at bedtime 30 tablet 3   Semaglutide,0.25 or 0.5MG /DOS, 2 MG/3ML SOPN Inject 0.5 mg into the skin once a week.     triamterene-hydrochlorothiazide (MAXZIDE-25) 37.5-25 MG tablet Take 1 tablet by mouth daily. 90 tablet 3   No facility-administered medications prior to visit.     Review of Systems:   Constitutional: No weight loss or gain, night sweats, fevers, chills, or lassitude. +daytime fatigue (improved) HEENT: No headaches, difficulty swallowing, tooth/dental problems, or sore throat. No sneezing, itching, ear ache, nasal congestion, or post nasal drip CV:   No chest pain, orthopnea, PND, swelling in lower extremities, anasarca, dizziness, palpitations, syncope Resp: +shortness of breath with exertion (improved); snoring (resolves with CPAP). No chest tightness; wheezing. No excess mucus or change in color of mucus. No productive or non-productive. No hemoptysis. No wheezing.  No chest wall deformity GI:  No heartburn, indigestion, abdominal pain, nausea, vomiting,  diarrhea, change in bowel habits, loss of appetite, bloody stools.  Skin: No rash, lesions, ulcerations MSK:  No joint pain or swelling.   Neuro: No dizziness or lightheadedness.  Psych: No depression or anxiety. Mood stable.    Physical Exam:  BP 136/74 (BP Location: Right Arm, Patient Position: Sitting, Cuff Size: Large)   Pulse 88   Temp 98.3 F (36.8 C) (Oral)   Ht 5\' 4"  (1.626 m)   Wt 248 lb 3.2 oz (112.6 kg)   LMP 07/14/2010 Comment: ovaries removed 2012  SpO2 96%   BMI 42.60 kg/m   GEN: Pleasant, interactive, well-appearing; obese; in no acute distress. HEENT:  Normocephalic and atraumatic. PERRLA. Sclera white. Nasal turbinates pink, moist and patent bilaterally. No rhinorrhea present. Oropharynx pink and moist, without exudate or edema. No lesions, ulcerations, or postnasal drip.  Mallampati IV NECK:  Supple w/ fair ROM. No JVD present. Normal carotid impulses w/o bruits. Thyroid symmetrical with no goiter or nodules palpated. No lymphadenopathy.   CV: RRR, no m/r/g, no peripheral edema. Pulses intact, +2 bilaterally. No cyanosis, pallor or clubbing. PULMONARY:  Unlabored, regular breathing. Clear bilaterally A&P w/o wheezes/rales/rhonchi. No accessory muscle use. No dullness to percussion. GI: BS present and normoactive. Soft, non-tender to palpation. No organomegaly or masses detected.  MSK: No erythema, warmth or tenderness. Cap refil <2 sec all extrem. No deformities or joint swelling noted.  Neuro: A/Ox3. No focal deficits noted.   Skin: Warm, no lesions or rashe Psych: Normal affect and behavior. Judgement and thought content appropriate.     Lab Results:  CBC    Component Value Date/Time   WBC 9.0 10/19/2021 1006   RBC 4.84 10/19/2021 1006   HGB 15.0 10/19/2021 1006   HGB 14.0 01/15/2015 0911   HCT 44.4 10/19/2021 1006   HCT 40.2 01/15/2015 0911   PLT 317.0 10/19/2021 1006   PLT 277 01/15/2015 0911  MCV 91.7 10/19/2021 1006   MCV 91.3 01/15/2015 0911    MCH 31.4 07/02/2021 2337   MCHC 33.8 10/19/2021 1006   RDW 12.7 10/19/2021 1006   RDW 12.8 01/15/2015 0911   LYMPHSABS 3.8 10/19/2021 1006   LYMPHSABS 3.0 01/15/2015 0911   MONOABS 0.8 10/19/2021 1006   MONOABS 0.6 01/15/2015 0911   EOSABS 0.4 10/19/2021 1006   EOSABS 0.3 01/15/2015 0911   BASOSABS 0.1 10/19/2021 1006   BASOSABS 0.1 01/15/2015 0911    BMET    Component Value Date/Time   NA 134 (L) 07/02/2021 2337   NA 141 01/15/2015 0911   K 4.4 07/02/2021 2337   K 3.6 01/15/2015 0911   CL 103 07/02/2021 2337   CO2 23 07/02/2021 2337   CO2 27 01/15/2015 0911   GLUCOSE 199 (H) 07/02/2021 2337   GLUCOSE 153 (H) 01/15/2015 0911   BUN 17 07/02/2021 2337   BUN 17.2 01/15/2015 0911   CREATININE 0.64 07/02/2021 2337   CREATININE 0.68 06/20/2019 1341   CREATININE 0.8 01/15/2015 0911   CALCIUM 8.8 (L) 07/02/2021 2337   CALCIUM 10.7 (H) 01/15/2015 0911   GFRNONAA >60 07/02/2021 2337   GFRAA >60 11/07/2018 2342    BNP    Component Value Date/Time   BNP 9.0 07/23/2015 0928     Imaging:  No results found.  Administration History     None          Latest Ref Rng & Units 02/04/2019    2:02 PM  PFT Results  FVC-Pre L 2.99   FVC-Predicted Pre % 82   FVC-Post L 3.10   FVC-Predicted Post % 85   Pre FEV1/FVC % % 81   Post FEV1/FCV % % 82   FEV1-Pre L 2.43   FEV1-Predicted Pre % 85   FEV1-Post L 2.53   DLCO uncorrected ml/min/mmHg 19.69   DLCO UNC% % 91   DLVA Predicted % 92   TLC L 5.74   TLC % Predicted % 110   RV % Predicted % 139     Lab Results  Component Value Date   NITRICOXIDE 23 10/19/2021        Assessment & Plan:   Asthma, mild persistent Improved control with change to Symbicort. No issues with upper airway irritation. Appears compensated on current regimen. Avoid triggers. Action plan in place.  Patient Instructions  Continue Albuterol inhaler 2 puffs or 3 mL neb every 6 hours as needed for shortness of breath or wheezing. Notify  if symptoms persist despite rescue inhaler/neb use. Use neb at night to see if this helps  Continue Zyrtec 1 tab daily for allergies Continue montelukast 1 tab At bedtime for allergies/asthma Continue Requip 1 tab At bedtime  Continue Symbicort 1-2 puff Twice daily. Brush tongue and rinse mouth afterwards    Continue CPAP, every night, minimum of 4-6 hours a night.  Change equipment as directed. Wash your tubing with warm soap and water daily, hang to dry. Wash humidifier portion weekly. Use bottled, distilled water and change daily Be aware of reduced alertness and do not drive or operate heavy machinery if experiencing this or drowsiness.  Exercise encouraged, as tolerated. Healthy weight management discussed.  Avoid or decrease alcohol consumption and medications that make you more sleepy, if possible. Notify if persistent daytime sleepiness occurs even with consistent use of PAP therapy.   We discussed how untreated sleep apnea puts an individual at risk for cardiac arrhthymias, pulm HTN, DM, stroke and increases their risk  for daytime accidents.    Change supplies... Every month Mask cushions and/or nasal pillows CPAP machine filters Every 3 months Mask frame (not including the headgear) CPAP tubing Every 6 months Mask headgear Chin strap (if applicable) Humidifier water tub   Follow up 4/11 at 1:30 pm via virtual visit with Katie Donesha Wallander,NP. If symptoms do not improve or worsen, please contact office for sooner follow up or seek emergency care.   Obstructive sleep apnea OSA on CPAP. Had difficulties with previous pressure settings. We adjusted her to 9-13 cmH2O on 2/11. She feels this is working better for her. Encouraged to work on increasing usage and utilize nightly. Aware of risks of untreated OSA and treatment options. Healthy weight loss encouraged. Understands proper care/use. Aware of safe driving practices. Reassess use at follow up.       I spent 25 minutes of  dedicated to the care of this patient on the date of this encounter to include pre-visit review of records, face-to-face time with the patient discussing conditions above, post visit ordering of testing, clinical documentation with the electronic health record, making appropriate referrals as documented, and communicating necessary findings to members of the patients care team.  Noemi Chapel, NP 04/30/2023  Pt aware and understands NP's role.

## 2023-04-30 NOTE — Assessment & Plan Note (Signed)
 Improved control with change to Symbicort. No issues with upper airway irritation. Appears compensated on current regimen. Avoid triggers. Action plan in place.  Patient Instructions  Continue Albuterol inhaler 2 puffs or 3 mL neb every 6 hours as needed for shortness of breath or wheezing. Notify if symptoms persist despite rescue inhaler/neb use. Use neb at night to see if this helps  Continue Zyrtec 1 tab daily for allergies Continue montelukast 1 tab At bedtime for allergies/asthma Continue Requip 1 tab At bedtime  Continue Symbicort 1-2 puff Twice daily. Brush tongue and rinse mouth afterwards    Continue CPAP, every night, minimum of 4-6 hours a night.  Change equipment as directed. Wash your tubing with warm soap and water daily, hang to dry. Wash humidifier portion weekly. Use bottled, distilled water and change daily Be aware of reduced alertness and do not drive or operate heavy machinery if experiencing this or drowsiness.  Exercise encouraged, as tolerated. Healthy weight management discussed.  Avoid or decrease alcohol consumption and medications that make you more sleepy, if possible. Notify if persistent daytime sleepiness occurs even with consistent use of PAP therapy.   We discussed how untreated sleep apnea puts an individual at risk for cardiac arrhthymias, pulm HTN, DM, stroke and increases their risk for daytime accidents.    Change supplies... Every month Mask cushions and/or nasal pillows CPAP machine filters Every 3 months Mask frame (not including the headgear) CPAP tubing Every 6 months Mask headgear Chin strap (if applicable) Humidifier water tub   Follow up 4/11 at 1:30 pm via virtual visit with Katie Rebecca Motta,NP. If symptoms do not improve or worsen, please contact office for sooner follow up or seek emergency care.

## 2023-05-22 ENCOUNTER — Other Ambulatory Visit: Payer: Self-pay | Admitting: Pulmonary Disease

## 2023-06-06 ENCOUNTER — Other Ambulatory Visit: Payer: Self-pay | Admitting: Pulmonary Disease

## 2023-06-06 NOTE — Telephone Encounter (Signed)
**Note De-identified  [place] Obfuscation** Please advise 

## 2023-06-21 ENCOUNTER — Other Ambulatory Visit: Payer: Self-pay | Admitting: Nurse Practitioner

## 2023-06-22 ENCOUNTER — Telehealth: Payer: Medicaid Other | Admitting: Nurse Practitioner

## 2023-06-22 DIAGNOSIS — Z87891 Personal history of nicotine dependence: Secondary | ICD-10-CM | POA: Diagnosis not present

## 2023-06-22 DIAGNOSIS — J4540 Moderate persistent asthma, uncomplicated: Secondary | ICD-10-CM | POA: Diagnosis not present

## 2023-06-22 DIAGNOSIS — J3089 Other allergic rhinitis: Secondary | ICD-10-CM

## 2023-06-22 DIAGNOSIS — G4733 Obstructive sleep apnea (adult) (pediatric): Secondary | ICD-10-CM

## 2023-06-22 MED ORDER — BUDESONIDE-FORMOTEROL FUMARATE 80-4.5 MCG/ACT IN AERO: 1.0000 | INHALATION_SPRAY | Freq: Two times a day (BID) | RESPIRATORY_TRACT | 12 refills | Status: AC

## 2023-06-22 NOTE — Patient Instructions (Signed)
 Continue Albuterol inhaler 2 puffs or 3 mL neb every 6 hours as needed for shortness of breath or wheezing. Notify if symptoms persist despite rescue inhaler/neb use. Use neb at night to see if this helps  Continue Zyrtec 1 tab daily for allergies Continue montelukast 1 tab At bedtime for allergies/asthma Continue Requip 1 tab At bedtime  Restart Symbicort 2 puff Twice daily. Brush tongue and rinse mouth afterwards. Updated prescription sent to the pharmacy    Restart CPAP, every night, minimum of 4-6 hours a night once asthma is better controlled  Change equipment as directed. Wash your tubing with warm soap and water daily, hang to dry. Wash humidifier portion weekly. Use bottled, distilled water and change daily Be aware of reduced alertness and do not drive or operate heavy machinery if experiencing this or drowsiness.  Exercise encouraged, as tolerated. Healthy weight management discussed.  Avoid or decrease alcohol consumption and medications that make you more sleepy, if possible. Notify if persistent daytime sleepiness occurs even with consistent use of PAP therapy.   We discussed how untreated sleep apnea puts an individual at risk for cardiac arrhthymias, pulm HTN, DM, stroke and increases their risk for daytime accidents.    Change supplies... Every month Mask cushions and/or nasal pillows CPAP machine filters Every 3 months Mask frame (not including the headgear) CPAP tubing Every 6 months Mask headgear Chin strap (if applicable) Humidifier water tub   Follow up 6-8 weeks with Dr. Wynona Neat or Katie Lashandra Arauz,NP. If symptoms do not improve or worsen, please contact office for sooner follow up or seek emergency care.

## 2023-06-22 NOTE — Progress Notes (Signed)
 Patient ID: [name], female     DOB: [birth year], 58 y.o.      MRN: [id number]  No chief complaint on file.   Virtual Visit via Video Note  I connected with [name] on 06/24/23 at  1:30 PM EDT by a video enabled telemedicine application and verified that I am speaking with the correct person using two identifiers.  Location: Patient: Home Provider: Office   I discussed the limitations of evaluation and management by telemedicine and the availability of in person appointments. The patient expressed understanding and agreed to proceed.  History of Present Illness: 58 year old female, former smoker followed for asthma and OSA. She is a patient of Dr. Alyne Jules and last seen in office on 04/30/2023 by [facility] NP. Past medical history significant for HTN, allergic rhinitis, DM, BPPV, RLS, HLD, ADHD, obesity, hx of breast cancer stage IA.    TEST/EVENTS:  07/23/2015 CTA chest: no evidence of PE. There are subcm mediastinaly lymph nodes. There is some mild scarring in the lateral left base. No evidence of parenchymal lung edema or consolidation. Hepatic steatosis.  02/04/2019 PFT: FVC 82, FEV1 85, ratio 82, TLC 110, DLCO 91. No significant BD; did have midflow reversibility  06/27/2021 CXR 2 view: both lungs are clear  11/20/2021 split-night sleep study: Negative for sleep apnea but suboptimal due to insomnia.  Sleep onset and sleep maintenance insomnia.  Periodic limb movement and restless legs. 01/04/2022 LDCT chest lung cancer screen: Small hiatal hernia.  Central airways are patent.  Linear opacity of left upper lobe, likely scarring or atelectasis.  Lung RADS 1.  Atherosclerosis. 01/07/2023 HST: AHI 7.3/h; SpO2 low 82%, average 89%   06/27/2021: OV with Dr. Gaynell Keeler. Hx of asthma; using rescue inhaler frequently. Previously on Symbicort but not on any maintenance inhalers. Unable to tolerate DPIs. She has also had trouble with sleeping and RLS; not wearing CPAP.  Progressive weight gain. HST ordered. CXR clear.    10/19/2021: OV with Tavionna Grout NP for follow up. She reports that she was doing well on the AirDuo and breathing was stable up until June. She unfortunately lost one of her best friends when they unexpectedly passed away. She started smoking again due to the emotional stress and feels that after this, her breathing has not been as great. She has quit smoking since but hasn't noticed much difference. Still having trouble feeling like she can expel all the air from her lungs when she breathes out and has some chest tightness at times. Symptoms are worse at bedtime. Uses her albuterol neb every evening and feels as though this helps. She denies cough, wheezing, chest pain, orthopnea, PND, leg swelling. She uses her AirDuo twice daily and takes zyrtec daily for allergies.    Never completed HST; states that insurance denied this. Still having daytime fatigue and snoring at night. Denies drowsy driving, sleep parasomnias, or morning headaches.    01/21/2022: OV with Dr. Gaynell Keeler.  No significant abnormality found on CT.  Does have a history of OSA and not on CPAP currently.  History of nonrestorative sleep and insomnia.  She also has restless leg for which she is on Requip.  History of asthma and currently on Flovent.  Changed to Alvesco.  Has not tolerated powdered inhalers in the past.  Still having symptoms that is concerning for sleep disordered breathing with daytime sleepiness and tiredness.  Recent sleep study was limited due to insomnia.  Encouraged to work on weight loss measures.  Yearly CT.   10/25/2022: OV with Geralene Afshar NP for follow-up.  Her breathing has been giving her a hard time recently.  She ran out of her inhaler sometime around June and has been having trouble since.  Had an issue with insurance and was switched from [place] to [place] and then back to [place] again.  She was told by her pharmacy that she would need to go on an alternative prescription and  that [place] would not take another prior authorization for the Alvesco.  She feels that this has worked the best for her out of anything regarding her asthma.  She has noticed that she gets more short winded with cleaning and then she feels like she has trouble getting her breath out, which seems to be worse at night.  Does have some wheezing.  No significant cough, chest congestion, fevers or chills. She still is having a lot of trouble with daytime fatigue, loud snoring. Feels like her sleep is poor quality.  No issues with drowsy driving or morning headaches.  Last sleep study in the lab was limited due to insomnia.  She would prefer to do a home sleep study if at all possible.  Does not feel like she would be able to do another study in the lab.   03/02/2023: OV with Adger Cantera NP for follow-up after undergoing home sleep study that revealed mild sleep apnea.  She has a lot of issues with her sleep at night due to her breathing.  Feels like she has difficulties laying flat.  Will sometimes wake up winded.  Feels very fatigued during the day.  She also cares for her mother, so her sleep hours fluctuate based on this.  No issues with drowsy driving, sleep parasomnia/paralysis. She was using the Alvesco after her last visit.  Felt like 2 puffs twice daily was too much for her so she had decreased to 1 puff in the morning.  She has since stopped this again.  Feels like it just does not work that well for her and causes some side effects.  She also like her breathing will get worse at night with it.  She is using the albuterol frequently.  Usually uses it a couple times during the day and then multiple times at night.  Does feel like this helps with her breathing.  She has not tried using her breathing treatments prior to bedtime.  Has not had to have any steroids or antibiotics since her last visit.  No hospitalizations.  Notices some wheezing at night but nothing during the day.  No significant cough.  No lower  extremity swelling.   04/30/2023: OV with Caedon Bond NP for follow-up.  Using Symbicort twice daily.  She has been tolerating this well.  Has not caused any issues with voice hoarseness or irritation.  Does feel like it has been helping her breathing.  Has not been coughing very much.  Not noticing any wheezing.  Nighttime feels better as well.  She was sick about a week or 2 ago but has felt back to normal since then.  She did not wear her CPAP the nights that she was sick.  She did restart therapy and feels like the decreased pressure settings are helping.  Seems to be tolerating the CPAP better now.  Denies any issues with drowsy driving, morning headaches or sleep parasomnia/paralysis. 03/28/2023-04/26/2023: CPAP 9-12 cmH2O 16/30 days; 33% >4 hr; average use 6 h 1 min Pressure 95th 12 Leaks 95th 10.1 AHI 1.4  06/22/2023:  Today - follow up Discussed the use of AI scribe software for clinical note transcription with the patient, who gave verbal consent to proceed.  History of Present Illness   Velvie Thomaston Stoll is a 58 year old female with asthma and OSA who presents with increased respiratory symptoms after discontinuation of Symbicort.  She has experienced increased respiratory symptoms after discontinuing Symbicort due to insurance issues. She has been off Symbicort for a couple of months and notes more issues with breathing and coughing without it. She has been using albuterol more frequently, approximately 12 to 16 puffs per day, compared to minimal use during the day and 4 to 6 puffs at night previously. She also mentions that the pollen season has exacerbated her symptoms. No sputum production. Denies any fevers, chills, hemoptysis.   She has not been using her CPAP machine recently, attributing this to the lack of Symbicort, which makes it difficult to exhale against the CPAP. She has mild sleep apnea and feels better when using the CPAP. She denies any drowsy driving.   She has tried using a  nebulizer but feels it is not as effective as before.     05/22/2023-06/20/2023 CPAP 9-12 cmH2O 8/30 days; 3% >4 hr; average use 1 hr 51 min Pressure 95th 10.2 Leaks 95th 6.9 AHI 1.6  Allergies  Allergen Reactions   Metoprolol Other (See Comments)    REACTION: " chest pain"   Sulfa Antibiotics Nausea And Vomiting   Verapamil Hcl Er Hypertension    Increased HR, blood pressure Makes breathing worse   Immunization History  Administered Date(s) Administered   Hep B, Unspecified 07/16/2001   Hepatitis A 11/22/2005   Hepatitis A, Adult 11/22/2005   Hepatitis B 07/16/2001   Influenza Split 01/10/2006   Influenza, Seasonal, Injecte, Preservative Fre 01/11/2017, 03/11/2020   Influenza,inj,Quad PF,6+ Mos 12/10/2012, 01/11/2017, 12/13/2017, 12/09/2018, 03/11/2020, 11/30/2021   PFIZER(Purple Top)SARS-COV-2 Vaccination 06/17/2019, 07/15/2019   Pneumococcal Polysaccharide-23 12/10/2012   Pneumococcal-Unspecified 12/10/2012   Td 04/17/2003   Tdap 04/21/2013   Typhoid Live 11/22/2005   Yellow Fever 11/22/2005   Zoster Recombinant(Shingrix) 10/03/2017, 12/19/2017   Past Medical History:  Diagnosis Date   Allergic rhinitis    Anxiety and depression    Attention deficit disorder (ADD) in adult    Atypical chest pain    Myocardial perf imaging 04/04/16: Low risk stress nuclear study with prior inferior infarct and mild peri-infarct ischemia; EF 55 with mild hypokinesis of the inferior wall.  Cath normal, so stress test was FALSE POS.   Cholelithiasis 03/2016   u/s: no signs of cholecystitis   Fibromyalgia syndrome    GERD (gastroesophageal reflux disease)    History of breast cancer 2012   left mastectomy and chemotherapy   History of chemotherapy    2012 completed   Hyperlipidemia    Hypertension    Moderate persistent asthma    dx'd age 42 (Dr. Bennetta Braun) hx of noncompliance with controller therapy due to cost. PFTs 01/2019->mild small airways obst dz, no resp to bronchodil. Trelegy  started 02/2019 by Dr. Gaynell Keeler. Breztri helpful as of 2021.   Morbid obesity (HCC)    NASH (nonalcoholic steatohepatitis) 02/2010   u/s confirmed 02/2010 (hx of transaminitis).  Repeat u/s 03/2016 showed no sign of cirrhosis or liver mass, just moderate hepatic steatosis. AST/ALT stable 06/2019   Neuropathy of both upper extremities    and both feet   OSA (obstructive sleep apnea)    Dr Bennetta Braun initially: pt couldn't tolerate CPAP.  Re-eval  12/2017 re-confirmed severe OSA, CPAP at 11 recommended.   Ovarian mass    resected 07/12/10   Recurrent upper respiratory infection (URI)    RLS (restless legs syndrome)    Type II or unspecified type diabetes mellitus without mention of complication, uncontrolled 05/15/2012    Tobacco History: Social History   Tobacco Use  Smoking Status Former   Current packs/day: 0.00   Average packs/day: 0.5 packs/day for 24.0 years (12.0 ttl pk-yrs)   Types: Cigarettes   Start date: 08/16/1994   Quit date: 08/16/2018   Years since quitting: 4.8   Passive exposure: Never  Smokeless Tobacco Never   Counseling given: Not Answered   Outpatient Medications Prior to Visit  Medication Sig Dispense Refill   albuterol (PROVENTIL) (2.5 MG/3ML) 0.083% nebulizer solution USE THREE MILLILITERS VIA NEBULIZATION BY MOUTH EVERY 6 HOURS AS NEEDED FOR WHEEZING OR FOR SHORTNESS OF BREATH 75 mL 5   albuterol (VENTOLIN HFA) 108 (90 Base) MCG/ACT inhaler INHALE ONE PUFF BY MOUTH EVERY 6 HOURS AS NEEDED FOR WHEEZING OR FOR SHORTNESS OF BREATH 18 g 1   amphetamine-dextroamphetamine (ADDERALL) 20 MG tablet Take 1 tablet (20 mg total) by mouth 2 (two) times daily. 60 tablet 0   atorvastatin (LIPITOR) 80 MG tablet TAKE ONE TABLET BY MOUTH DAILY 30 tablet 5   cetirizine (ZYRTEC) 10 MG tablet Take 10 mg by mouth daily.     citalopram (CELEXA) 40 MG tablet TAKE ONE TABLET BY MOUTH DAILY 90 tablet 0   clonazePAM (KLONOPIN) 0.5 MG tablet TAKE ONE TABLET BY MOUTH TWICE DAILY AS NEEDED 60  tablet 3   fluticasone (CUTIVATE) 0.05 % cream Apply topically 2 (two) times daily. 60 g 1   ibuprofen (ADVIL,MOTRIN) 200 MG tablet Take 800 mg by mouth as needed for fever or moderate pain.      MAGNESIUM OXIDE PO Take 800 mg by mouth daily.     montelukast (SINGULAIR) 10 MG tablet Take 1 tablet (10 mg total) by mouth at bedtime. 30 tablet 5   Olopatadine-Mometasone (RYALTRIS) 665-25 MCG/ACT SUSP Place 1-2 sprays into the nose in the morning and at bedtime. 29 g 5   pregabalin (LYRICA) 75 MG capsule Take 75 mg by mouth.     rOPINIRole (REQUIP) 1 MG tablet TAKE ONE TABLET BY MOUTH at bedtime 30 tablet 1   Semaglutide,0.25 or 0.5MG /DOS, 2 MG/3ML SOPN Inject 0.5 mg into the skin once a week.     triamterene-hydrochlorothiazide (MAXZIDE-25) 37.5-25 MG tablet Take 1 tablet by mouth daily. 90 tablet 3   budesonide-formoterol (SYMBICORT) 80-4.5 MCG/ACT inhaler Inhale 1 puff into the lungs 2 (two) times daily. 1 each 12   No facility-administered medications prior to visit.     Review of Systems:   Constitutional: No weight loss or gain, night sweats, fevers, chills, or lassitude. +daytime fatigue  HEENT: No headaches, difficulty swallowing, tooth/dental problems, or sore throat. No itching, ear ache +sneezing, nasal congestion, post nasal drip CV:  +PND. No chest pain, orthopnea, swelling in lower extremities, anasarca, dizziness, palpitations, syncope Resp: + shortness of breath with exertion; dry cough; snoring (resolves with CPAP). No excess mucus or change in color of mucus. No hemoptysis. No wheezing.  No chest wall deformity GI:  No heartburn, indigestion GU: No dysuria, change in color of urine, urgency or frequency.   Skin: No rash, lesions, ulcerations MSK:  No joint pain or swelling.   Neuro: No dizziness or lightheadedness.  Psych: No depression or anxiety. Mood stable.  Observations/Objective: Patient is well-developed, well-nourished in no acute distress.  Resting comfortably at  home.  No labored breathing.  Speech is clear and coherent with logical content.  Patient is alert and oriented at baseline.   Assessment and Plan: Poorly controlled mild persistent asthma Poorly controlled following discontinuation of ICS/LABA. Does not appear to be in acute exacerbation per her report. She does not feel she needs oral corticosteroids at this point. Advised her to resume Symbicort,, now that insurance is reinstated. Verbalized understanding. She will notify if symptoms fail to improve or worsen. Action plan in place. Reassess at follow up.   Patient Instructions  Continue Albuterol inhaler 2 puffs or 3 mL neb every 6 hours as needed for shortness of breath or wheezing. Notify if symptoms persist despite rescue inhaler/neb use. Use neb at night to see if this helps  Continue Zyrtec 1 tab daily for allergies Continue montelukast 1 tab At bedtime for allergies/asthma Continue Requip 1 tab At bedtime  Restart Symbicort 2 puff Twice daily. Brush tongue and rinse mouth afterwards. Updated prescription sent to the pharmacy    Restart CPAP, every night, minimum of 4-6 hours a night once asthma is better controlled  Change equipment as directed. Wash your tubing with warm soap and water daily, hang to dry. Wash humidifier portion weekly. Use bottled, distilled water and change daily Be aware of reduced alertness and do not drive or operate heavy machinery if experiencing this or drowsiness.  Exercise encouraged, as tolerated. Healthy weight management discussed.  Avoid or decrease alcohol consumption and medications that make you more sleepy, if possible. Notify if persistent daytime sleepiness occurs even with consistent use of PAP therapy.   We discussed how untreated sleep apnea puts an individual at risk for cardiac arrhthymias, pulm HTN, DM, stroke and increases their risk for daytime accidents.    Change supplies... Every month Mask cushions and/or nasal pillows CPAP  machine filters Every 3 months Mask frame (not including the headgear) CPAP tubing Every 6 months Mask headgear Chin strap (if applicable) Humidifier water tub   Follow up 6-8 weeks with Dr. Gaynell Keeler or Katie Duel Conrad,NP. If symptoms do not improve or worsen, please contact office for sooner follow up or seek emergency care.   Obstructive sleep apnea Mild OSA. Suboptimal compliance of CPAP due to poorly controlled asthma. Encouraged to resume therapy once asthma managed poor effectively. Reviewed risks of untreated mild OSA and potential treatment options. She does receive benefit from use. Safe driving practices reviewed.   Other allergic rhinitis Continue allergy regimen with daily antihistamine      I discussed the assessment and treatment plan with the patient. The patient was provided an opportunity to ask questions and all were answered. The patient agreed with the plan and demonstrated an understanding of the instructions.   The patient was advised to call back or seek an in-person evaluation if the symptoms worsen or if the condition fails to improve as anticipated.  I provided 32 minutes of non-face-to-face time during this encounter.   Roetta Clarke, NP

## 2023-06-24 ENCOUNTER — Encounter: Payer: Self-pay | Admitting: Nurse Practitioner

## 2023-06-24 NOTE — Assessment & Plan Note (Signed)
 Continue allergy regimen with daily antihistamine

## 2023-06-24 NOTE — Assessment & Plan Note (Signed)
 Mild OSA. Suboptimal compliance of CPAP due to poorly controlled asthma. Encouraged to resume therapy once asthma managed poor effectively. Reviewed risks of untreated mild OSA and potential treatment options. She does receive benefit from use. Safe driving practices reviewed.

## 2023-06-24 NOTE — Assessment & Plan Note (Signed)
 Poorly controlled following discontinuation of ICS/LABA. Does not appear to be in acute exacerbation per her report. She does not feel she needs oral corticosteroids at this point. Advised her to resume Symbicort,, now that insurance is reinstated. Verbalized understanding. She will notify if symptoms fail to improve or worsen. Action plan in place. Reassess at follow up.   Patient Instructions  Continue Albuterol inhaler 2 puffs or 3 mL neb every 6 hours as needed for shortness of breath or wheezing. Notify if symptoms persist despite rescue inhaler/neb use. Use neb at night to see if this helps  Continue Zyrtec 1 tab daily for allergies Continue montelukast 1 tab At bedtime for allergies/asthma Continue Requip 1 tab At bedtime  Restart Symbicort 2 puff Twice daily. Brush tongue and rinse mouth afterwards. Updated prescription sent to the pharmacy    Restart CPAP, every night, minimum of 4-6 hours a night once asthma is better controlled  Change equipment as directed. Wash your tubing with warm soap and water daily, hang to dry. Wash humidifier portion weekly. Use bottled, distilled water and change daily Be aware of reduced alertness and do not drive or operate heavy machinery if experiencing this or drowsiness.  Exercise encouraged, as tolerated. Healthy weight management discussed.  Avoid or decrease alcohol consumption and medications that make you more sleepy, if possible. Notify if persistent daytime sleepiness occurs even with consistent use of PAP therapy.   We discussed how untreated sleep apnea puts an individual at risk for cardiac arrhthymias, pulm HTN, DM, stroke and increases their risk for daytime accidents.    Change supplies... Every month Mask cushions and/or nasal pillows CPAP machine filters Every 3 months Mask frame (not including the headgear) CPAP tubing Every 6 months Mask headgear Chin strap (if applicable) Humidifier water tub   Follow up 6-8 weeks with Dr.  Gaynell Keeler or Katie Tyla Burgner,NP. If symptoms do not improve or worsen, please contact office for sooner follow up or seek emergency care.

## 2023-07-06 ENCOUNTER — Telehealth: Payer: Medicaid Other | Admitting: Nurse Practitioner

## 2023-07-30 ENCOUNTER — Other Ambulatory Visit: Payer: Self-pay | Admitting: Pulmonary Disease

## 2023-07-30 ENCOUNTER — Other Ambulatory Visit: Payer: Self-pay | Admitting: Nurse Practitioner

## 2023-07-30 DIAGNOSIS — J3089 Other allergic rhinitis: Secondary | ICD-10-CM

## 2023-09-07 ENCOUNTER — Other Ambulatory Visit: Payer: Self-pay | Admitting: Pulmonary Disease

## 2023-09-07 ENCOUNTER — Other Ambulatory Visit: Payer: Self-pay | Admitting: Nurse Practitioner

## 2023-09-07 DIAGNOSIS — J3089 Other allergic rhinitis: Secondary | ICD-10-CM

## 2023-11-21 ENCOUNTER — Other Ambulatory Visit: Payer: Self-pay | Admitting: Nurse Practitioner

## 2023-11-21 ENCOUNTER — Other Ambulatory Visit: Payer: Self-pay | Admitting: Pulmonary Disease

## 2023-11-21 NOTE — Telephone Encounter (Signed)
**Note De-identified  [place] Obfuscation** Please advise 

## 2023-12-27 ENCOUNTER — Other Ambulatory Visit: Payer: Self-pay | Admitting: Nurse Practitioner

## 2024-01-30 ENCOUNTER — Other Ambulatory Visit: Payer: Self-pay | Admitting: Nurse Practitioner

## 2024-02-29 ENCOUNTER — Other Ambulatory Visit: Payer: Self-pay | Admitting: Pulmonary Disease

## 2024-02-29 DIAGNOSIS — J3089 Other allergic rhinitis: Secondary | ICD-10-CM

## 2024-04-01 ENCOUNTER — Other Ambulatory Visit: Payer: Self-pay | Admitting: Pulmonary Disease

## 2024-04-01 NOTE — Telephone Encounter (Signed)
 Pt requesting refill of Clonazepam . Please advise, thank you!   Video Visit: 06/22/23 Last Fill: 11/22/23
